# Patient Record
Sex: Male | Born: 1950
Health system: Southern US, Community
[De-identification: ages and names within clinical notes are randomized; demographics above are authoritative.]

## PROBLEM LIST (undated history)

## (undated) DIAGNOSIS — E785 Hyperlipidemia, unspecified: Secondary | ICD-10-CM

## (undated) DIAGNOSIS — I7300 Raynaud's syndrome without gangrene: Secondary | ICD-10-CM

## (undated) DIAGNOSIS — I714 Abdominal aortic aneurysm, without rupture, unspecified: Secondary | ICD-10-CM

## (undated) DIAGNOSIS — E871 Hypo-osmolality and hyponatremia: Secondary | ICD-10-CM

## (undated) DIAGNOSIS — I509 Heart failure, unspecified: Secondary | ICD-10-CM

## (undated) DIAGNOSIS — K219 Gastro-esophageal reflux disease without esophagitis: Secondary | ICD-10-CM

## (undated) DIAGNOSIS — I639 Cerebral infarction, unspecified: Secondary | ICD-10-CM

## (undated) DIAGNOSIS — R112 Nausea with vomiting, unspecified: Secondary | ICD-10-CM

## (undated) DIAGNOSIS — F419 Anxiety disorder, unspecified: Secondary | ICD-10-CM

## (undated) DIAGNOSIS — I739 Peripheral vascular disease, unspecified: Secondary | ICD-10-CM

## (undated) DIAGNOSIS — Z9889 Other specified postprocedural states: Secondary | ICD-10-CM

## (undated) DIAGNOSIS — Z9289 Personal history of other medical treatment: Secondary | ICD-10-CM

## (undated) DIAGNOSIS — I10 Essential (primary) hypertension: Secondary | ICD-10-CM

## (undated) DIAGNOSIS — M51369 Other intervertebral disc degeneration, lumbar region without mention of lumbar back pain or lower extremity pain: Secondary | ICD-10-CM

## (undated) DIAGNOSIS — J449 Chronic obstructive pulmonary disease, unspecified: Secondary | ICD-10-CM

## (undated) DIAGNOSIS — M5136 Other intervertebral disc degeneration, lumbar region: Secondary | ICD-10-CM

## (undated) DIAGNOSIS — I219 Acute myocardial infarction, unspecified: Secondary | ICD-10-CM

## (undated) DIAGNOSIS — I2510 Atherosclerotic heart disease of native coronary artery without angina pectoris: Secondary | ICD-10-CM

## (undated) HISTORY — DX: Chronic obstructive pulmonary disease, unspecified: J44.9

## (undated) HISTORY — DX: Hyperlipidemia, unspecified: E78.5

## (undated) HISTORY — DX: Other intervertebral disc degeneration, lumbar region without mention of lumbar back pain or lower extremity pain: M51.369

## (undated) HISTORY — DX: Other intervertebral disc degeneration, lumbar region: M51.36

## (undated) HISTORY — DX: Abdominal aortic aneurysm, without rupture: I71.4

## (undated) HISTORY — DX: Cerebral infarction, unspecified: I63.9

## (undated) HISTORY — DX: Raynaud's syndrome without gangrene: I73.00

## (undated) HISTORY — PX: HERNIA REPAIR: SHX51

## (undated) HISTORY — DX: Abdominal aortic aneurysm, without rupture, unspecified: I71.40

## (undated) HISTORY — DX: Essential (primary) hypertension: I10

## (undated) HISTORY — PX: CAROTID ENDARTERECTOMY: SUR193

## (undated) HISTORY — DX: Peripheral vascular disease, unspecified: I73.9

## (undated) HISTORY — DX: Atherosclerotic heart disease of native coronary artery without angina pectoris: I25.10

---

## 2003-02-22 ENCOUNTER — Encounter: Payer: Self-pay | Admitting: Emergency Medicine

## 2003-02-23 ENCOUNTER — Inpatient Hospital Stay (HOSPITAL_COMMUNITY): Admission: EM | Admit: 2003-02-23 | Discharge: 2003-02-24 | Payer: Self-pay | Admitting: Emergency Medicine

## 2003-02-24 ENCOUNTER — Encounter (INDEPENDENT_AMBULATORY_CARE_PROVIDER_SITE_OTHER): Payer: Self-pay | Admitting: Specialist

## 2004-05-24 HISTORY — PX: CARDIAC CATHETERIZATION: SHX172

## 2005-03-14 ENCOUNTER — Inpatient Hospital Stay (HOSPITAL_COMMUNITY): Admission: EM | Admit: 2005-03-14 | Discharge: 2005-03-17 | Payer: Self-pay | Admitting: *Deleted

## 2005-03-29 ENCOUNTER — Encounter: Admission: RE | Admit: 2005-03-29 | Discharge: 2005-03-29 | Payer: Self-pay | Admitting: *Deleted

## 2005-04-13 ENCOUNTER — Ambulatory Visit (HOSPITAL_COMMUNITY): Admission: AD | Admit: 2005-04-13 | Discharge: 2005-04-14 | Payer: Self-pay | Admitting: *Deleted

## 2005-04-13 ENCOUNTER — Encounter (INDEPENDENT_AMBULATORY_CARE_PROVIDER_SITE_OTHER): Payer: Self-pay | Admitting: Specialist

## 2005-04-13 HISTORY — PX: CAROTID ENDARTERECTOMY: SUR193

## 2005-06-07 ENCOUNTER — Encounter (INDEPENDENT_AMBULATORY_CARE_PROVIDER_SITE_OTHER): Payer: Self-pay | Admitting: Specialist

## 2005-06-07 ENCOUNTER — Ambulatory Visit (HOSPITAL_COMMUNITY): Admission: AD | Admit: 2005-06-07 | Discharge: 2005-06-08 | Payer: Self-pay | Admitting: *Deleted

## 2005-07-21 ENCOUNTER — Ambulatory Visit (HOSPITAL_COMMUNITY): Admission: RE | Admit: 2005-07-21 | Discharge: 2005-07-21 | Payer: Self-pay | Admitting: *Deleted

## 2005-10-04 ENCOUNTER — Ambulatory Visit (HOSPITAL_COMMUNITY): Admission: RE | Admit: 2005-10-04 | Discharge: 2005-10-04 | Payer: Self-pay | Admitting: *Deleted

## 2005-10-15 ENCOUNTER — Observation Stay (HOSPITAL_COMMUNITY): Admission: RE | Admit: 2005-10-15 | Discharge: 2005-10-17 | Payer: Self-pay | Admitting: *Deleted

## 2005-10-15 DIAGNOSIS — I639 Cerebral infarction, unspecified: Secondary | ICD-10-CM

## 2005-10-15 HISTORY — DX: Cerebral infarction, unspecified: I63.9

## 2005-10-15 HISTORY — PX: ILIAC ARTERY STENT: SHX1786

## 2005-12-02 ENCOUNTER — Encounter: Admission: RE | Admit: 2005-12-02 | Discharge: 2005-12-02 | Payer: Self-pay | Admitting: Family Medicine

## 2005-12-09 ENCOUNTER — Inpatient Hospital Stay (HOSPITAL_COMMUNITY): Admission: EM | Admit: 2005-12-09 | Discharge: 2005-12-13 | Payer: Self-pay | Admitting: Emergency Medicine

## 2005-12-27 ENCOUNTER — Encounter: Admission: RE | Admit: 2005-12-27 | Discharge: 2006-03-27 | Payer: Self-pay | Admitting: Internal Medicine

## 2006-01-16 ENCOUNTER — Observation Stay (HOSPITAL_COMMUNITY): Admission: EM | Admit: 2006-01-16 | Discharge: 2006-01-17 | Payer: Self-pay | Admitting: Emergency Medicine

## 2006-07-29 ENCOUNTER — Encounter: Admission: RE | Admit: 2006-07-29 | Discharge: 2006-07-29 | Payer: Self-pay | Admitting: Family Medicine

## 2006-11-09 ENCOUNTER — Encounter: Admission: RE | Admit: 2006-11-09 | Discharge: 2006-11-09 | Payer: Self-pay | Admitting: Orthopedic Surgery

## 2006-11-28 ENCOUNTER — Encounter: Admission: RE | Admit: 2006-11-28 | Discharge: 2006-11-28 | Payer: Self-pay | Admitting: Orthopedic Surgery

## 2006-12-21 ENCOUNTER — Emergency Department (HOSPITAL_COMMUNITY): Admission: EM | Admit: 2006-12-21 | Discharge: 2006-12-21 | Payer: Self-pay | Admitting: *Deleted

## 2007-02-09 ENCOUNTER — Ambulatory Visit: Payer: Self-pay | Admitting: *Deleted

## 2007-06-22 ENCOUNTER — Ambulatory Visit: Payer: Self-pay | Admitting: *Deleted

## 2007-07-26 ENCOUNTER — Ambulatory Visit: Payer: Self-pay | Admitting: *Deleted

## 2007-07-27 ENCOUNTER — Ambulatory Visit: Payer: Self-pay | Admitting: *Deleted

## 2008-02-08 ENCOUNTER — Ambulatory Visit: Payer: Self-pay | Admitting: *Deleted

## 2008-08-01 ENCOUNTER — Ambulatory Visit: Payer: Self-pay | Admitting: *Deleted

## 2009-02-05 ENCOUNTER — Ambulatory Visit: Payer: Self-pay | Admitting: Vascular Surgery

## 2009-08-11 ENCOUNTER — Ambulatory Visit: Payer: Self-pay | Admitting: Surgery

## 2010-04-21 ENCOUNTER — Ambulatory Visit: Payer: Self-pay | Admitting: Cardiology

## 2010-05-01 ENCOUNTER — Ambulatory Visit: Payer: Self-pay | Admitting: Vascular Surgery

## 2010-05-19 ENCOUNTER — Ambulatory Visit: Payer: Self-pay | Admitting: Cardiology

## 2010-06-14 ENCOUNTER — Encounter: Payer: Self-pay | Admitting: Family Medicine

## 2010-06-16 ENCOUNTER — Ambulatory Visit: Payer: Self-pay | Admitting: Cardiology

## 2010-07-20 ENCOUNTER — Encounter (INDEPENDENT_AMBULATORY_CARE_PROVIDER_SITE_OTHER): Payer: Private Health Insurance - Indemnity

## 2010-07-20 DIAGNOSIS — R609 Edema, unspecified: Secondary | ICD-10-CM

## 2010-07-23 NOTE — Procedures (Unsigned)
DUPLEX DEEP VENOUS EXAM - LOWER EXTREMITY  INDICATION:  Swollen left ankle.  HISTORY:  Edema:  Yes. Trauma/Surgery:  No. Pain:  Yes. PE:  No. Previous DVT:  No. Anticoagulants:  No. Other:  DUPLEX EXAM:               CFV   SFV   PopV  PTV    GSV               R  L  R  L  R  L  R   L  R  L Thrombosis    o  o     o     o      o     o Spontaneous   +  +     +     +      +     + Phasic        +  +     +     +      +     + Augmentation  +  +     +     +      +     + Compressible  +  +     +     +      +     + Competent     +  +     +     o      +     +  Legend:  + - yes  o - no  p - partial  D - decreased  IMPRESSION: 1. No evidence of left lower extremity deep venous thrombosis. 2. Preliminary findings were called to Dr. Susa Simmonds office.   _____________________________ Judeth Cornfield. Scot Dock, M.D.  EM/MEDQ  D:  07/20/2010  T:  07/20/2010  Job:  [id number]

## 2010-08-03 ENCOUNTER — Other Ambulatory Visit: Payer: Self-pay

## 2010-08-05 ENCOUNTER — Other Ambulatory Visit: Payer: Self-pay

## 2010-08-14 ENCOUNTER — Other Ambulatory Visit (INDEPENDENT_AMBULATORY_CARE_PROVIDER_SITE_OTHER): Payer: Private Health Insurance - Indemnity

## 2010-08-14 DIAGNOSIS — Z48812 Encounter for surgical aftercare following surgery on the circulatory system: Secondary | ICD-10-CM

## 2010-08-14 DIAGNOSIS — I6529 Occlusion and stenosis of unspecified carotid artery: Secondary | ICD-10-CM

## 2010-08-22 NOTE — Procedures (Unsigned)
CAROTID DUPLEX EXAM  INDICATION:  Bilateral carotid endarterectomies.  HISTORY: Diabetes:  No. Cardiac:  Coronary artery disease. Hypertension:  Yes. Smoking:  Previous. Previous Surgery:  Bilateral carotid endarterectomies. CV History:  Currently asymptomatic. Amaurosis Fugax No, Paresthesias No, Hemiparesis No Other:  Hyperlipidemia.                                      RIGHT             LEFT Brachial systolic pressure:         162               154 Brachial Doppler waveforms:         Normal            Normal Vertebral direction of flow:        Antegrade         Antegrade DUPLEX VELOCITIES (cm/sec) CCA peak systolic                   104               95 ECA peak systolic                   118               [id number] ICA peak systolic                   106               89 ICA end diastolic                   17                22 PLAQUE MORPHOLOGY: PLAQUE AMOUNT:                      None              None PLAQUE LOCATION:  IMPRESSION: 1. Patent bilateral carotid endarterectomy sites with no evidence of     restenosis of the internal carotid arteries. 2. Antegrade vertebral arteries bilaterally. 3. Stable from previous study.  ___________________________________________ Conrad [place], MD  EM/MEDQ  D:  08/14/2010  T:  08/14/2010  Job:  [id number]

## 2010-08-31 ENCOUNTER — Telehealth: Payer: Self-pay | Admitting: *Deleted

## 2010-08-31 NOTE — Telephone Encounter (Signed)
Left message for pt to f/u with Dr. Burt Knack in July since Dr. Doreatha Lew is retiring.  Pt told to call with any concerns.

## 2010-10-05 ENCOUNTER — Other Ambulatory Visit: Payer: Self-pay | Admitting: Cardiology

## 2010-10-05 MED ORDER — RANITIDINE HCL 150 MG PO TABS: 300.0000 mg | ORAL_TABLET | ORAL | Status: DC

## 2010-10-06 NOTE — Procedures (Signed)
CAROTID DUPLEX EXAM   INDICATION:  Follow up bilateral carotid endarterectomies.   HISTORY:  Diabetes:  No.  Cardiac:  CAD.  Hypertension:  Yes.  Smoking:  Previous.  Previous Surgery:  Bilateral carotid endarterectomy.  CV History:  Asymptomatic.  Amaurosis Fugax No, Paresthesias No, Hemiparesis No.                                       RIGHT             LEFT  Brachial systolic pressure:         211               212  Brachial Doppler waveforms:         Normal            Normal  Vertebral direction of flow:        Not visualized    Antegrade  DUPLEX VELOCITIES (cm/sec)  CCA peak systolic                   59                71  ECA peak systolic                   80                0000000  ICA peak systolic                   82                68  ICA end diastolic                   24                25  PLAQUE MORPHOLOGY:  PLAQUE AMOUNT:                      None              None  PLAQUE LOCATION:   IMPRESSION:  1. Patent bilateral carotid endarterectomy sites with no bilateral      internal carotid artery stenosis noted.  2. Unable to adequately visualize the right vertebral artery due to      vessel depth.  3. No significant change noted when compared to the previous      examination on 02/08/2008.   ___________________________________________  V. Leia Alf, MD   CH/MEDQ  D:  08/11/2009  T:  08/12/2009  Job:  [id number]

## 2010-10-06 NOTE — Procedures (Signed)
VASCULAR LAB EXAM   INDICATION:  Follow up left common iliac and external iliac stent.   HISTORY:  Diabetes:  No.  Cardiac:  Coronary artery disease.  Hypertension:  Yes.   EXAM:  Duplex of left common iliac stent and left external iliac artery  through superficial femoral artery.   IMPRESSION:  Patent left common iliac and external iliac stent with no  evidence of focal stenosis.   ___________________________________________  P. Drucie Opitz, M.D.   MG/MEDQ  D:  02/08/2008  T:  02/08/2008  Job:  [id number]

## 2010-10-06 NOTE — Procedures (Signed)
CAROTID DUPLEX EXAM   INDICATION:  Follow up carotid artery disease.   HISTORY:  Diabetes:  No.  Cardiac:  Coronary artery disease.  Hypertension:  Yes.  Smoking:  No, quit three years ago.  Previous Surgery:  Right carotid endarterectomy with DPA on 04/13/2005,  left carotid endarterectomy with DPA on 06/08/2005, both by Dr. Amedeo Plenty.  CV History:  Amaurosis Fugax No, Paresthesias No, Hemiparesis No                                       RIGHT             LEFT  Brachial systolic pressure:         200               200  Brachial Doppler waveforms:         Triphasic         Triphasic  Vertebral direction of flow:        Unable to detect  Unable to detect  DUPLEX VELOCITIES (cm/sec)  CCA peak systolic                   65                64  ECA peak systolic                   81                [id number]  ICA peak systolic                   83                42  ICA end diastolic                   34                17  PLAQUE MORPHOLOGY:                  Mixed             None  PLAQUE AMOUNT:                      Mild              None                        PLAQUE LOCATION:  ICA   None   Mildly elevated velocities were noted in both distal ICA's with no  plaque noted.   IMPRESSION:  1. A 1-39% right internal carotid artery stenosis, status post      endarterectomy.  2. No left internal carotid artery stenosis, status post      endarterectomy.  3. Vertebral arteries not detected, possible occlusion versus      technical difficulty.  4. Study essentially unchanged from February 03, 2006.   ___________________________________________  P. Drucie Opitz, M.D.   DP/MEDQ  D:  02/09/2007  T:  02/09/2007  Job:  [id number]

## 2010-10-06 NOTE — Procedures (Signed)
DUPLEX ULTRASOUND OF ABDOMINAL AORTA   INDICATION:  Follow up abdominal aortic aneurysm.   HISTORY:  Diabetes:  No.  Cardiac:  Coronary artery disease.  Hypertension:  Yes.  Smoking:  Quit about four years ago.  Connective Tissue Disorder:  Family History:  Previous Surgery:   DUPLEX EXAM:         AP (cm)                   TRANSVERSE (cm)  Proximal             2.70 cm                   3.04 cm  Mid                  3.70 cm                   3.83 cm  Distal               2.51 cm                   2.59 cm  Right Iliac          Not well visualized       Not well visualized  Left Iliac           Not well visualized       Not well visualized   PREVIOUS:  Date:  AP:  3.98  TRANSVERSE:  3.84   IMPRESSION:  Abdominal aortic aneurysm noted with the largest  measurement of 3.70 X 3.83 cm.   ___________________________________________  P. Drucie Opitz, M.D.   MG/MEDQ  D:  02/08/2008  T:  02/08/2008  Job:  [id number]

## 2010-10-06 NOTE — Procedures (Signed)
DUPLEX ULTRASOUND OF ABDOMINAL AORTA   INDICATION:  Follow up abdominal aortic aneurysm.   HISTORY:  Diabetes:  No.  Cardiac:  CAD.  Hypertension:  Yes.  Smoking:  Quit.  Connective Tissue Disorder:  Family History:  Father.  Previous Surgery:  Left CIA stenting/PTA and left EIA PTA on 10/15/05 by  Dr. Amedeo Plenty.   DUPLEX EXAM:         AP (cm)                   TRANSVERSE (cm)  Proximal             2.69 cm                   2.74 cm  Mid                  3.78 cm                   3.77 cm  Distal               2.28 cm                   2.43 cm  Right Iliac          1.07 cm  Left Iliac           0.92 cm   PREVIOUS:  Date: 02/08/08  AP:  3.7  TRANSVERSE:  3.83   IMPRESSION:  1. Stable abdominal aortic aneurysm with largest measurement of 3.78      cm X 3.77 cm.  2. Note:  Some areas of limited visualization due to body habitus,      bowel gas, and technical limitations.   ___________________________________________  P. Drucie Opitz, M.D.   AS/MEDQ  D:  08/01/2008  T:  08/01/2008  Job:  [id number]

## 2010-10-06 NOTE — Procedures (Signed)
RENAL ARTERY DUPLEX EVALUATION   INDICATION:  Followup evaluation of bilateral renal artery stenosis by  angiogram on Oct 15, 2005.   HISTORY:  Diabetes:  No.  Cardiac:  Coronary artery disease.  Hypertension:  Yes.  Smoking:  Quit 4 years ago.   RENAL ARTERY DUPLEX FINDINGS:  Aorta-Proximal:  37 cm/s (known small AAA)  Aorta-Mid:  25 cm/s (known small AAA)  Aorta-Distal:  74 cm/s  Celiac Artery Origin:  145 cm/s  SMA Origin:  201 cm/s                                    RIGHT               LEFT  Renal Artery Origin:             159/50 cm/s         181/66 cm/s  Renal Artery Proximal:           76/26 cm/s          55/21 cm/s  Renal Artery Mid:                78/25 cm/s          55/24 cm/s  Renal Artery Distal:             76/32 cm/s          52/20 cm/s  Hilar Acceleration Time (AT):    0.07 m/s2           0.06 m/s2  Renal-Aortic Ratio (RAR):        N/A                 N/A  Kidney Size:                     11.8 cm             0000000 cm  End Diastolic Ratio (EDR):       0.39 to 0.43        0.40 to 0.42  Resistive Index (RI):            0.65                0.61   IMPRESSION:  1. Renal-to-aortic ratio is not used due to patient's known small AAA.  2. Velocity criteria suggests a 40-60% left renal artery stenosis,      which would be stable as compared to 5/07 arteriogram.  3. Right renal artery appears to have perphaps a mild, <60% stenosis.  4. Kidneys are normal with respect to size and shape bilaterally.  5. No evidence of parenchymal disease bilaterally.   ___________________________________________  P. Drucie Opitz, M.D.   MC/MEDQ  D:  07/27/2007  T:  07/27/2007  Job:  [id number]

## 2010-10-06 NOTE — Procedures (Signed)
VASCULAR LAB EXAM   INDICATION:  Follow up left common iliac artery and external iliac  artery, PTA and stenting.   HISTORY:  Diabetes:  No.  Cardiac:  Coronary artery disease.  Hypertension:  Yes.   EXAM:  Duplex of the left common iliac and external iliac artery.   IMPRESSION:  Patent left common iliac artery and left external iliac  artery with no evidence of focal stenosis.   ___________________________________________  P. Drucie Opitz, M.D.   MG/MEDQ  D:  07/26/2007  T:  07/26/2007  Job:  [id number]

## 2010-10-06 NOTE — Procedures (Signed)
CAROTID DUPLEX EXAM   INDICATION:  Follow up bilateral carotid endarterectomy.   HISTORY:  Diabetes:  No.  Cardiac:  Coronary artery disease.  Hypertension:  Yes.  Smoking:  Quit four years ago.  Previous Surgery:  Please see above.  CV History:  Amaurosis Fugax No, Paresthesias No, Hemiparesis No.                                       RIGHT             LEFT  Brachial systolic pressure:         129               124  Brachial Doppler waveforms:         Biphasic          Biphasic  Vertebral direction of flow:        Not well visualized                 Antegrade  DUPLEX VELOCITIES (cm/sec)  CCA peak systolic                   76                84  ECA peak systolic                   98                [id number]  ICA peak systolic                   77                83  ICA end diastolic                   26                22  PLAQUE MORPHOLOGY:                  None              None  PLAQUE AMOUNT:                      None              None  PLAQUE LOCATION:                    None              None   IMPRESSION:  1. Normal carotid duplex noted bilaterally.  2. Status post bilateral carotid endarterectomies.  3. Antegrade left vertebral artery.   ___________________________________________  P. Drucie Opitz, M.D.   MG/MEDQ  D:  02/08/2008  T:  02/08/2008  Job:  [id number]

## 2010-10-06 NOTE — Procedures (Signed)
DUPLEX ULTRASOUND OF ABDOMINAL AORTA   INDICATION:  Follow up abdominal aortic aneurysm.   HISTORY:  Diabetes:  No.  Cardiac:  Coronary artery disease.  Hypertension:  Yes.  Smoking:  Quit four years ago.  Connective Tissue Disorder:  Family History:  Previous Surgery:  Bilateral carotid endarterectomy.   DUPLEX EXAM:         AP (cm)                   TRANSVERSE (cm)  Proximal             2.67 Cm                   2.36 cm  Mid                  3.98 cm                   3.84 cm  Distal               2.33 cm                   2.29 cm  Right Iliac          Not well visualized       Not well visualized  Left Iliac           Not well visualized       Not well visualized   PREVIOUS:  Date:  3.8 cm   IMPRESSION:  Abdominal aortic aneurysm noted with the largest  measurement of 3.98 cm X 3.84 cm.   ___________________________________________  P. Drucie Opitz, M.D.   MG/MEDQ  D:  07/26/2007  T:  07/26/2007  Job:  [id number]

## 2010-10-06 NOTE — Procedures (Signed)
CAROTID DUPLEX EXAM   INDICATION:  Followup, bilateral carotid endarterectomy.   HISTORY:  Diabetes:  No.  Cardiac:  Coronary artery disease.  Hypertension:  Yes.  Smoking:  Quit four years ago.  Previous Surgery:  Bilateral carotid endarterectomies.  CV History:  Amaurosis Fugax No, Paresthesias No, Hemiparesis No                                       RIGHT             LEFT  Brachial systolic pressure:         134               142  Brachial Doppler waveforms:         Biphasic          Biphasic  Vertebral direction of flow:        Antegrade         Antegrade  DUPLEX VELOCITIES (cm/sec)  CCA peak systolic                   66                85  ECA peak systolic                   112               [id number]  ICA peak systolic                   86                70  ICA end diastolic                   25                24  PLAQUE MORPHOLOGY:                  None              None  PLAQUE AMOUNT:                      None              None  PLAQUE LOCATION:                    None              None   IMPRESSION:  1. Normal carotid duplex noted bilaterally.  2. Status post bilateral carotid endarterectomy.  3. Antegrade bilateral vertebral arteries.   ___________________________________________  P. Drucie Opitz, M.D.   MG/MEDQ  D:  07/26/2007  T:  07/26/2007  Job:  [id number]

## 2010-10-06 NOTE — Procedures (Signed)
DUPLEX ULTRASOUND OF ABDOMINAL AORTA   INDICATION:  Followup abdominal aortic aneurysm.   HISTORY:  Diabetes:  No.  Cardiac:  CAD.  Hypertension:  Yes.  Smoking:  Previous.  Connective Tissue Disorder:  Family History:  Father.  Previous Surgery:  Left CIA PTA / stent and left EIA PTA 10/15/2005 by  Dr. Amedeo Plenty.   DUPLEX EXAM:         AP (cm)                   TRANSVERSE (cm)  Proximal             2.55 cm                   2.72 cm  Mid                  3.01 cm                   3.21 cm  Distal               4.16 cm                   3.95 cm  Right Iliac          1.10 cm                   1.05 cm  Left Iliac           0.86 cm                   1.01 cm   PREVIOUS:  Date:  08/01/2008  AP:  3.78  TRANSVERSE:  3.77   IMPRESSION:  1. Abdominal aortic aneurysm shows slight increase from previous      studies with largest measurement of 4.16 cm x 3.95 cm.  2. Patent left common iliac artery stent.   ___________________________________________  Jessy Oto. Fields, MD   AS/MEDQ  D:  02/05/2009  T:  02/05/2009  Job:  [id number]

## 2010-10-06 NOTE — Assessment & Plan Note (Signed)
OFFICE VISIT   [name], [name]  DOB:  [birth year]                                       07/27/2007  [id number]   [name] returned to the office today after undergoing an  abdominal Duplex scan.  This reveals his left common iliac and external  iliac artery stents to be widely patent without evidence of restenosis.  His ankle brachial indices are fairly well maintained at 0.96 in the  right leg and 0.86 in the left.   He has a small abdominal aortic aneurysm which measures 4 cm in maximal  diameter.   He also has a history of bilateral carotid endarterectomies and his  followup carotid Duplex reveals normal velocities without evidence of  restenosis and antegrade bilateral vertebral flow.   His main complaint at this time is that of buttock and hip discomfort  with ambulation.  He stops, sits down and rests and he can then walk  once again with recurrence of the process.   These findings seem most consistent with his lumbar spine degenerative  disease.  It is my understanding that there are plans by Dr. French Ana to  go ahead and try some epidural injections, I am in agreement with this.   Will plan followup otherwise in 6 months with a carotid Doppler and  ultrasound of the abdomen.   Dorothea Glassman, M.D.  Electronically Signed   PGH/MEDQ  D:  07/27/2007  T:  07/28/2007  Job:  [id number]   cc:   Lockie Pares, M.D.  Janifer Adie, M.D.  Kaylyn Lim., M.D.

## 2010-10-06 NOTE — Assessment & Plan Note (Signed)
OFFICE VISIT   [name], [name]  DOB:  01-Feb-1951                                       05/01/2010  [id number]   This is an established patient.   HISTORY OF PRESENT ILLNESS:  This is a 60 year old gentleman being  followed in this practice for three separate issues.  He has previously  undergone bilateral carotid endarterectomies and most recently had his  repeat duplexes completed on 08/13/2009, at that point he had no  recurrence of disease on either side.  In regards to his carotid disease  he has no TIA or stroke symptoms which include no episodes of amaurosis  fugax, no episodes of blindness, no facial drooping or hemiplegia and no  episodes of expressive or receptive aphasia.  His risk factors for  carotid and peripheral disease include hyperlipidemia, hypertension and  previous history of significant smoking.  His risk factor management  includes use of Vytorin, Crestor.  He is on aspirin and Plavix for  antiplatelets.  He is not a diabetic.  In terms of his hypertensive  control he is on lisinopril and carvedilol and labetalol.  His second  issue is peripheral arterial disease.  He has previously undergone left  common iliac and external iliac stents.  At this point he does not have  symptoms consistent with vascular claudication.  He notes pain in his  hips with walking.  Apparently his ambulation is severely limited by his  coronary artery disease.  He apparently has nonintervenable coronary  artery disease.  He has very limited activity due to his heart.  He  notes that he has to walk he develops a burning sensation in his hip and  pain there which then he resolves with rest and he continues walking and  he is able to ambulate to the degree he needs to, to complete his  activities of daily living.  He is also able to ambulate and walk on  vacations.  He does not note any pain with ambulation in the foot or the  calf.  His third problem that  we are managing him for is his abdominal  aortic aneurysm of which he notes no abdominal pain.  He has no family  history of aneurysmal disease and he has no history of connective tissue  disorder.   PAST MEDICAL HISTORY:  1. Peripheral arterial disease.  2. Abdominal aortic aneurysm.  3. Lumbar degenerative disk disease.  4. Hypertension.  5. Hyperlipidemia.  6. Coronary artery disease.  7. COPD.  8. Raynaud's phenomenon.   PAST SURGICAL HISTORY:  1. Left common and external iliac artery stent placement.  2. Left carotid endarterectomy.  3. Right carotid endarterectomy.   SOCIAL HISTORY:  He quit smoking in 2007, prior to that he had smoked  for nearly 75 pack year history.  He drinks about two beers a day of  alcohol.  Otherwise no illicit drugs.   FAMILY HISTORY:  Father had coronary artery disease, hypertension,  hyperlipidemia.  The mother had carotid disease, osteoarthritis and  Alzheimer's disease.   MEDICATIONS:  Included Vytorin, labetalol, Plavix, aspirin, folic acid,  Crestor, carvedilol, lisinopril, Xanax, vitamin B1.   ALLERGIES:  He has no known drug allergies.   PHYSICAL EXAMINATION:  Vital signs:  Today he had a blood pressure  147/82, heart rate of 71, respirations were 12, satting 98%.  General:  He is alert and oriented x3.  He is well-developed, well-nourished,  slightly obese.  Head:  Normocephalic, atraumatic.  ENT:  Hearing is  slightly decreased in the right ear but otherwise grossly intact.  Nares  without any erythema or drainage.  Oropharynx without any erythema or  exudate.  He does not have any teeth and he has upper and lower dentures  placed.  Eyes:  Pupils were equal, round, reactive to light.  Extraocular movements were intact.  Neck:  Supple neck.  No nuchal  rigidity.  No palpable lymphadenopathy.  Pulmonary:  Symmetric  expansion.  Good air movement.  Clear to auscultation bilaterally with  no rales, rhonchi or wheezing.  Cardiac:   Regular rate and rhythm.  Normal S1-S2.  No murmurs, rubs, thrills or gallops were auscultated.  Vascular:  I was able to palpate radial, brachial pulses bilaterally  without any problems.  He had palpable carotids.  The incisions in the  neck are well-healed.  There is no bruit in either carotid.  His aorta  cannot be palpated due to his pannus.  Femorals were palpable.  Popliteal on the left side were palpable, I could not feel the popliteal  on the right side.  DPs bilaterally were strong and there were palpable  PTs bilaterally.  GI:  He is obese, soft abdomen, nontender,  nondistended.  No guarding or rebound.  No hepatosplenomegaly.  No  masses.  Motor strength:  He had 5/5 strength in all extremities.  There  is no evidence of any gangrene or ulcerations.  Both hands had purplish  fingers and his feet were also purplish.  Neurological:  Cranial nerves  II-XII were intact.  He had some decreased sensation in his feet up to  the level of the ankle.  His upper extremities had intact sensation.  Motor exam was as listed above.  Psychiatric:  Judgment was intact.  His mood and affect were appropriate  for his clinical situation.  Skin:  Extremities were as listed above.  There were no other signs of any cyanosis elsewhere other than the hands  and the feet.  There were otherwise no rashes noted.  Lymphatic:  There  was no cervical, axillary or inguinal lymphadenopathy.   NONINVASIVE STUDIES:  First study was bilateral lower extremity ABIs.  Right side was 0.99 down from 1.1 in September of 2009.  The right side  is 0.78 down from 1.0 in the same time span.  The waveforms on the left  side are triphasic, on the right side they were biphasic with some  hyperemia.  The second study was a duplex ultrasound of the aorta.  It  demonstrates a 4.05 cm transverse measurement and previously this was  4.0 so this is stable in terms of size.  A technical note, the proximal  abdominal aorta could  not be visualized due to bowel gas.  The final  study was a left iliac duplex.  The maximal velocities are up to 210  cm/sec in the iliac stent.   MEDICAL DECISION MAKING:  This is a 60 year old gentleman with multiple  vascular issues that we are managing.  In terms of his carotid disease  he will continue actually annual duplexes, given that his last two  studies have demonstrated no significant restenosis it is unlikely he is  going to develop any additional post carotid endarterectomy stenosis as  most of the cases of restenosis occur within the 2 years immediately  after the carotid  endarterectomy.  In terms of his peripheral arterial  disease I would continue q.6 month surveillance on his stents.  His  right leg is asymptomatic currently despite decrease in ABI as I suspect  his cardiac disease may be limiting him manifesting any vasogenic  claudication.  At this point he has palpable pulses bilaterally, so  there is no intervention that would improve it beyond being asymptomatic  so I do not intend to intervene on him at this point.  In terms of his  aorta he has only a 4 cm AAA currently and all the recent studies in  regards to small interventions on smaller aneurysms have demonstrated no  advantage so the current standard of care still is to intervene at the 5  cm to 5.5 cm.  At this point we discussed he is at risk for possible  rupture but his rupture rate is probably on the order of 2%-5% per year  substantially lower than his rate of mortality with operative  intervention so at this point I would continue also q.6 month  surveillance on his aorta.     Adele Barthel, MD  Electronically Signed   BC/MEDQ  D:  05/01/2010  T:  05/01/2010  Job:  [id number]   cc:   Dr Doreatha Lew

## 2010-10-06 NOTE — Procedures (Signed)
DUPLEX ULTRASOUND OF ABDOMINAL AORTA   INDICATION:  Abdominal aortic aneurysm.   HISTORY:  Diabetes:  No.  Cardiac:  CAD.  Hypertension:  Yes.  Smoking:  Previous.  Connective Tissue Disorder:  Family History:  Father.  Previous Surgery:  Left common iliac artery stent and left external  iliac PTA on 10/15/2005 by Dr. Amedeo Plenty.   DUPLEX EXAM:         AP (cm)                   TRANSVERSE (cm)  Proximal             Not visualized            Not visualized  Mid                  2.1 cm                    2.2 cm  Distal               4.2 cm                    4.0 cm  Right Iliac          1.1 cm                    1.2 cm  Left Iliac           0.7 cm                    1.1 cm   PREVIOUS:  Date: 02/05/2009  AP:  4.16  TRANSVERSE:  3.95   IMPRESSION:  1. Stable aneurysmal measurements of the distal abdominal aorta noted.  2. Patent left common iliac and external iliac arteries with increased      velocities of >200 cm/s noted at the distal common iliac and      proximal external iliac artery levels, as described on the attached      work sheet.  3. The right ankle brachial index is 1.0, and the left ankle brachial      index is 1.1.  4. Unable to adequately visualize the proximal aorta due to overlying      bowel gas patterns.         ___________________________________________  V. Leia Alf, MD   CH/MEDQ  D:  08/11/2009  T:  08/12/2009  Job:  [id number]

## 2010-10-06 NOTE — Procedures (Signed)
DUPLEX ULTRASOUND OF ABDOMINAL AORTA   INDICATION:  Abdominal aortic aneurysm followup.   HISTORY:  Diabetes:  No.  Cardiac:  Coronary artery disease.  Hypertension:  Yes.  Smoking:  Previous.  Connective Tissue Disorder:  Family History:  Father.  Previous Surgery:  Left common iliac artery stent and left external  iliac artery PTA on 10/15/2005.   DUPLEX EXAM:         AP (cm)                   TRANSVERSE (cm)  Proximal             Not visualized            Not visualized  Mid                  3.60 cm                   3.67 cm  Distal               3.84 cm                   4.05 cm  Right Iliac          cm                        cm  Left Iliac           cm                        cm   PREVIOUS:  Date:  08/11/2009  AP:  4.2  TRANSVERSE:  4.0   IMPRESSION:  1. Stable aneurysmal measurements of the distal abdominal aorta noted.  2. Unable to visualize proximal abdominal aorta due to bowel gas.  3. Patent left common iliac artery and external iliac artery with      increased velocities noted at the distal common iliac and proximal      external iliac artery levels, described on the following worksheet.  4. Right ankle brachial indices have decreased in comparison from the      previous study.  5. Left ankle brachial indices are within normal limits.   ___________________________________________  Adele Barthel, MD   EM/MEDQ  D:  05/01/2010  T:  05/01/2010  Job:  [id number]

## 2010-10-06 NOTE — Assessment & Plan Note (Signed)
OFFICE VISIT   LAWREN, FLORER  DOB:  1950-12-16                                       06/22/2007  [id number]   The patient is a 60 year old gentleman with known vascular disease.  History of bilateral carotid endarterectomies, and left iliac PTA and  stenting.   He has had no lower extremity arterial Doppler studies carried out since  his iliac stent procedures in 2007.  He presents at this time with lower  extremity fatigue.  He can walk for a short distance and then develops  discomfort in his hips and thighs.  He has to stop and sit down, rest,  and can then walk once again.  This is a recurrent process.   He has had an MRI that does reveal a 3.8 cm abdominal aortic aneurysm.  His spine MRI shows disk disease at 2-3, 3-4, and 4-5.  Facet disease is  also noted at 3-4 and 4-5.   Since last seen he has put on some weight.  His BP is 196/110 and pulse  is 88 per minute.  He is alert and oriented.  In no acute distress.  No  carotid bruits.  His abdomen is soft and nontender.  A midline abdominal  bruit is present and radiation to the femoral arteries with bruits also  present.  His femoral pulses are 2+ bilaterally.  Intact posterior  tibial and dorsalis pedis pulses.  No peripheral edema.   The patient has potentially multifactorial causes for his lower  extremity symptoms.  I have scheduled him to undergo an abdominal and  pelvic duplex scan and lower extremity arterial Dopplers.  He will  return to see me with the results of these and further recommendations  will be made at that time.   Dorothea Glassman, M.D.  Electronically Signed   PGH/MEDQ  D:  06/22/2007  T:  06/23/2007  Job:  [id number]   cc:   Lockie Pares, M.D.  Janifer Adie, M.D.  Kaylyn Lim., M.D.

## 2010-10-09 NOTE — H&P (Signed)
NAMEMERCURY, GATCHELL                ACCOUNT NO.:  [id number]   MEDICAL RECORD NO.:  [id number]          PATIENT TYPE:  AMB   LOCATION:  SDS                          FACILITY:  [facility]   PHYSICIAN:  Dorothea Glassman, M.D.    DATE OF BIRTH:  [birth year]   DATE OF ADMISSION:  06/07/2005  DATE OF DISCHARGE:                                HISTORY & PHYSICAL   CHIEF COMPLAINT:  Carotid artery stenosis on the left.   HISTORY OF PRESENT ILLNESS:  This is a 60 year old Caucasian male with a  past medical history of coronary artery disease and heavy tobacco abuse.  The patient was referred to Dr. Dorothea Glassman' office for evaluation of  abnormal carotid Dopplers on March 29, 2005.  The Doppler evaluation and  CT angiography verified severe bilateral internal carotid artery stenosis.  The patient also underwent a cardiac catheterization by Dr. Carlean Jews  of [facility].  The reported findings showed diffusely diseased  right coronary system with no major disease in the left system.  The patient  has been treated medically.  When Dr. Amedeo Plenty first evaluated him in  consultation, he felt that the patient would benefit from staged bilateral  carotid endarterectomies.  The patient was left-handed, so Dr. Amedeo Plenty chose  to pursue a right carotid endarterectomy as the initial procedure on  April 13, 2005.  The patient tolerated the procedure well without any  complications.  The patient was evaluated in the office on June 03, 2005,  and wanted to proceed with his left carotid endarterectomy as soon as  possible, and he was scheduled for June 07, 2005.  The patient denied any  sensory, motor or visual deficits prior to his right carotid endarterectomy  and presently.  The patient denies any new onset of headaches, nausea,  vomiting, vertigo, dizziness or seizures.  The patient also denies numbness,  tingling, weakness, dysphagia, visual changes, syncope, memory loss or  TIA/CVA  symptoms.   PAST MEDICAL HISTORY:  1.  Extracranial cerebrovascular occlusive disease with severe bilateral      internal carotid artery stenosis, status post right CEA.  2.  History of heavy tobacco abuse.  3.  Coronary artery disease.  4.  Hypertension.  5.  Hyperlipidemia.  6.  Chronic obstructive pulmonary disease.  7.  History of cluster headaches.   PAST SURGICAL HISTORY:  1.  Right carotid endarterectomy with Dacron patch angioplasty on April 13, 2005.  2.  Right knee surgery in the 1980's.  3.  Right inguinal hernia repair as a child.  4.  Tonsillectomy as a child.   ALLERGIES:  No known drug allergies.   MEDICATIONS:  1.  Verapamil 120 mg  p.o. b.i.d.  2.  Labetalol 200 mg p.o. daily.  3.  Lisinopril/hydrochlorothiazide 20/25 mg p.o. daily.  4.  Vytorin 10/20 mg p.o. daily.  5.  Aspirin 325 mg p.o. daily.  6.  Imdur 30 mg p.o. daily.  7.  Topamax  25 mg, two tab p.o. q.h.s.  8.  Amitriptyline 25 mg p.o. p.r.n.  9.  Plavix 75 mg p.o. daily.   REVIEW OF SYSTEMS:  See the HPI for pertinent positives and negatives.  Otherwise negative for diabetes mellitus, CVA, syncope and kidney disease.   SOCIAL HISTORY:  The patient is married and lives with his wife.  The  patient is a former tobacco user.  He quit on March 14, 2005.  The patient  occasionally drinks alcohol.  The patient is still employed and works in  Engineer, drilling.  The patient does drive.   FAMILY HISTORY:  Mother has a family history of degenerative joint disease  and coronary artery disease.  The patient's father has a history of  hypertension, hyperlipidemia, coronary artery disease and peripheral  vascular disease.   PHYSICAL EXAMINATION:  VITAL SIGNS:  Blood pressure 119/70, heart rate 68,  respirations 16.  GENERAL:  A 60 year old Caucasian male, in no acute distress and afebrile.  HEENT:  Normocephalic and atraumatic.  Pupils equal, round, reactive to  light and accommodation.   Extraocular movements intact.  Oral mucosa pink  and moist.  Sclerae anicteric.  NECK:  Supple, a left carotid bruit is noted on exam.  No right carotid  bruit is heard.  LUNGS:  Respirations symmetrical on inspiration and clear to auscultation  bilaterally.  HEART:  A regular rate and rhythm.  No murmurs, gallops or rubs.  ABDOMEN:  Soft, nontender, non-distended.  Normoactive bowel sounds x4.  GENITOURINARY:  Deferred.  RECTAL:  Deferred.  EXTREMITIES:  No edema.  Temperature warm.  Pulses 2+ throughout  bilaterally.  NEUROLOGIC:  Cranial nerves I-XII  are grossly intact.  Alert and oriented  x3.  Gait is steady.  Muscle strength is 5+ throughout bilaterally.  Deep  tendon reflexes 2+ and symmetrical.   ASSESSMENT:  1.  Extracranial cerebrovascular occlusive disease with bilateral internal      carotid stenosis, status post right carotid endarterectomy.  2.  Hypertension.  3.  Hyperlipidemia.  4.  History of heavy tobacco abuse.  5.  Coronary artery disease.  6.  Chronic obstructive pulmonary disease.   PLAN:  1.  Admit the patient to [facility] on June 07, 2005,      under Dr. Drucie Opitz' service.  2.  The patient will undergo a left carotid endarterectomy.  3.  Dr. Amedeo Plenty has seen and evaluated the patient prior to admission and      explained the risks and benefits of the procedure in great detail.  The      patient has agreed to continue.      Richardson Dopp, PA      P. Drucie Opitz, M.D.  Electronically Signed    JMW/MEDQ  D:  06/04/2005  T:  06/05/2005  Job:  [id number]   cc:   Janifer Adie, M.D.  Fax: [phone]   Kaylyn Lim., M.D.  Fax: [phone]

## 2010-10-09 NOTE — Op Note (Signed)
NAMESLYVESTER, GAJ                ACCOUNT NO.:  [id number]   MEDICAL RECORD NO.:  [id number]          PATIENT TYPE:  OIB   LOCATION:  [id number]                         FACILITY:  [facility]   PHYSICIAN:  Dorothea Glassman, M.D.    DATE OF BIRTH:  04-17-1951   DATE OF PROCEDURE:  06/08/2005  DATE OF DISCHARGE:                                 OPERATIVE REPORT   SURGEON:  P Cameron Sprang, MD   ASSISTANT:  Judeth Cornfield. Scot Dock, MD and [place], [place].   ANESTHETIC:  General endotracheal.   ANESTHESIOLOGIST:  Dr. Ola Spurr.   PREOPERATIVE DIAGNOSIS:  Severe left internal carotid artery stenosis.   POSTOPERATIVE DIAGNOSIS:  Severe left internal carotid artery stenosis.   PROCEDURE:  Left carotid endarterectomy Dacron patch angioplasty.   CLINICAL NOTE:  [name] is a 60 year old male with history of heavy  tobacco use. He has previously undergone right carotid endarterectomy for  bilateral severe internal carotid artery stenosis. He is brought to the  operating at this time for elective left carotid endarterectomy. Risks the  operative procedure explained to the patient in detail, major morbidity and  mortality associated with this procedure 1-2% to include but not limited to  MI, CVA, cranial nerve injury and death.   OPERATIVE PROCEDURE:  The patient brought to the operating room stable  condition. Placed in supine position. General endotracheal anesthesia  induced. Foley catheter arterial line in place. Left neck prepped and draped  in sterile fashion.   Curvilinear skin incision made along the anterior border of left  sternomastoid muscle. Dissection carried down through subcutaneous tissue  with electrocautery. Platysma was divided. Deep dissection carried down  along the anterior border of the sternomastoid to the carotid to expose the  carotid sheath. The facial vein ligated with 2-0 silk and divided. The  carotid bifurcation exposed. The common carotid artery mobilized  down to the  omohyoid muscle and encircled with vessel loop. The external carotid and  superior thyroid encircled with vessel loops. The internal carotid artery  freed up to the posterior belly of the digastric muscle and encircled with  vessel loop. The hypoglossal nerve retracted superiorly and preserved. The  vagus nerve reflected posteriorly and preserved.   The carotid bifurcation revealed calcified plaque extending into the origin  of left internal carotid artery. The carotid vessels were noted to be  relatively small in caliber.   The patient administered 7000 units heparin intravenously. Adequate  circulation time permitted. Carotid vessels controlled with clamps.  Longitudinal arteriotomy made in the distal common carotid artery. The  arteriotomy extended across carotid bulb up into the internal carotid  artery. There was heavily calcified plaque present at the carotid  bifurcation and extending into the internal carotid artery. A high-grade  stenosis was present. Shunt was inserted.   An endarterectomy elevator used to remove the plaque. The endarterectomy  carried down into the common carotid artery where the plaque was divided  transversely with Potts scissors. The plaque then raised up and the bulb of  the superior thyroid and external carotid were endarterectomized using an  eversion technique. The distal internal carotid artery plaque then feathered  out well. Fragments of plaque removed with fine forceps. The site irrigated  with heparin saline solution.   A patch angioplasty and an endarterectomy site then carried out using  running 6-0 Prolene suture with a Finesse Dacron patch. At completion of the  patch angioplasty the shunt was removed. All vessels flushed. Clamps removed  directing initial antegrade flow up the external carotid artery, following  this, the internal carotid was released.   There was an excellent pulse and Doppler signal in the distal internal   carotid artery. The patient administered 50 milligrams of protamine  intravenously. Adequate hemostasis obtained.   Sponges and instrument counts correct. The sternomastoid fascia closed  running 2-0 Vicryl suture. Platysma closed running 3-0 Vicryl suture. Skin  closed with 4-0 Monocryl. Sterile dressings applied. The patient awakened in  the operating room. Neurologically intact. Transferred to recovery in stable  condition. No apparent complications.      Dorothea Glassman, M.D.  Electronically Signed     PGH/MEDQ  D:  06/08/2005  T:  06/08/2005  Job:  [id number]   cc:   Janifer Adie, M.D.  Fax: [phone]

## 2010-10-09 NOTE — Discharge Summary (Signed)
NAMESELESTINO, GRAINER                            ACCOUNT NO.:  [id number]   MEDICAL RECORD NO.:  [id number]                   PATIENT TYPE:   LOCATION:                                       FACILITY:   PHYSICIAN:  Corinna L. Conley Canal, MD             DATE OF BIRTH:  14-Oct-1950   DATE OF ADMISSION:  02/23/2003  DATE OF DISCHARGE:  02/25/2003                                 DISCHARGE SUMMARY   DIAGNOSES:  1. Ischemic colitis.  2. Colon polyps status post biopsy, pathology pending.  3. Hypertension.  4. Hyperlipidemia.  5. Tobacco abuse.  6. Lower gastrointestinal bleed secondary to above, resolved.   DISCHARGE MEDICATIONS:  1. Same as upon admission including Lipitor 10 mg p.o. daily.  2. Verapamil 240 mg p.o. daily.   DIET:  Low cholesterol, low salt.   FOLLOWUP:  Follow up with Ronald Lobo, M.D. in two to three weeks for  results of pathology.   ACTIVITY:  Ad lib.   CONDITION ON DISCHARGE:  Stable.   CONSULTS:  Ronald Lobo, M.D.   PROCEDURE:  Colonoscopy on February 25, 2003 showing mild to moderate  inflammatory changes involving the cecum consistent with resolving ischemia.  No gangrene.  Small sessile polyp which was biopsied.   PERTINENT LABORATORIES:  UA negative for nitrites, leukocyte esterase, or  blood.  Comprehensive metabolic panel was normal except for a bilirubin of  1.9.  Initial white blood cell count was 12.9.  At discharge it was normal.  Hemoglobin and hematocrit were normal on admission and remained so  throughout hospitalization.   HISTORY AND HOSPITAL COURSE:  Mr. Arseneau is a 60 year old patient of Janifer Adie, M.D. who presented with right lower quadrant pain and melena.  He had normal vital signs on admission, had some mild right lower quadrant  tenderness.  He had a CT of the abdomen and pelvis which showed right-sided  colitis.  No abscess.  His laboratories were as above.  The patient was  admitted to the floor.  He had stool  cultures done which are pending.  He  was started empirically on Cipro and Flagyl IV and kept on clear liquids.  Ronald Lobo, M.D. was  consulted and performed the above mentioned procedure.  He recommended  stopping the antibiotics and advancing diet and he will follow up as an  outpatient.  At the time of discharge patient was tolerating a regular diet  and his pain was much improved.  He felt that he would not require any  analgesics as an outpatient.                                               Corinna L. Conley Canal, MD    CLS/MEDQ  D:  02/24/2003  T:  02/25/2003  Job:  [id number]   cc:   Ronald Lobo, M.D.  [address], [place]  [place], [place] [address]  Fax: [phone]   Janifer Adie, M.D.  [address]  [place]  [place] [address]  Fax: [phone]

## 2010-10-09 NOTE — Consult Note (Signed)
NAMEMATHIAS, [name]                            ACCOUNT NO.:  [id number]   MEDICAL RECORD NO.:  [id number]                   PATIENT TYPE:  INP   LOCATION:  0466                                 FACILITY:  [facility]   PHYSICIAN:  Ronald Lobo, M.D.                DATE OF BIRTH:  10-23-1950   DATE OF CONSULTATION:  02/23/2003  DATE OF DISCHARGE:                                   CONSULTATION   REASON FOR CONSULTATION:  Dr. Montey Hora L. Conley Canal, covering for the [facility], asked me to see this 60 year old gentleman because of abdominal  pain and abnormal CT scan and rectal bleeding.   [name] was in his usual state of generally good health until two nights  ago, when, in the middle of the night, he developed rather diffuse low  abdominal pain and right lower quadrant pain.  It was not excruciating.  In  the morning, he had maroon and dark blood with his bowel movement, which was  somewhat loose or diarrheal in character.  He had a similar bowel movement  later in the day.  The pain persisted yesterday.  It was not associated with  fever, nausea, or vomiting.  He was seen at [facility] as  a work-in patient and thought to possibly have appendicitis based on a high  white count and apparently abdominal tenderness, prompting referral to the  [facility] Emergency Room where a CT scan showed significant thickening of  the cecum, and, to a lesser degree, the terminal ileum (films reviewed with  Randall Hiss A. Tery Sanfilippo, M.D.), with a normal appendix.  Overnight, the patient has  been treated with antibiotics and he feels currently about 40% better.   He has never had any similar symptoms to this.  There is no family history  of inflammatory bowel disease.  He does take an aspirin roughly every other  day, but he denies recent NSAID exposure.  His white count has basically  remained the same overnight, 12,900, but his hemoglobin had dropped slightly  with overnight  hydration, from 15.9 to 14.6.   The patient does smoke at least a pack per day.   There is also a history of hyperlipidemia.   ALLERGIES:  No known drug allergies.   OUTPATIENT MEDICATIONS:  1. Verapamil.  2. Lipitor.  3. Zofran.   OPERATIONS:  1. Hernia repair as a child.  2. Some knee surgery.   MEDICAL ILLNESSES:  1. Cluster headaches.  2. Hypertension.  3. Hyperlipidemia.   HABITS:  The patient smokes at least one to two packs per day and has what  sounds like sporadic alcohol usage.   FAMILY HISTORY:  Negative for GI tract illnesses.   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  His work schedule is somewhat erratic.  He is married and lives  with his  wife.   REVIEW OF SYSTEMS:  Negative for prodromal anorexia or weight loss,  dysphagia, or heartburn symptoms, or chronic abdominal pain.  He has a bowel  movement pretty much every day, sometimes skipping a day.  No problem with  antecedent diarrhea.   PHYSICAL EXAMINATION:  GENERAL:  This is a healthy-appearing gentleman in no  evident distress.  Neither anxious nor depressed.  VITAL SIGNS:  Normal.  He has been consistently afebrile since admission.  Pulse is 68, blood pressure 133/68.  CHEST:  A few rhonchi in the left lung region.  The right lung is clear.  HEART:  Normal.  ABDOMEN:  Normal bowel sounds, no bruits, no palpable organomegaly, and some  very mild subjective right lower quadrant and suprapubic or lower abdominal  tenderness, but the abdomen is soft and without guarding or certainly any  peritoneal findings at present.  RECTAL:  On admission, showed dark heme positive stool and was not repeated.  NEUROLOGIC:  The patient is alert, oriented, coherent, appropriate, and  without obvious focal motor deficits or cranial nerve deficits.   LABORATORY DATA:  White count 12,900 post hydration.  Hemoglobin 14.6,  platelets 210,000.  Differential count on admission  was 79 polys, 12 lymphs,  9 monocytes.  Metabolic panel on admission included BUN 9, creatinine 0.9,  total bilirubin 1.9, with other liver chemistries entirely normal, and an  albumin of 4 prior to hydration.   CT scan:  See above.   IMPRESSION:  Acute nonspecific colitis.  The differential diagnoses would  include localized ischemia, infection, new onset inflammatory bowel disease,  cancer.  This could conceivably also be sensor of non-steroidal anti-  inflammatory drugs (aspirin) induced colitis.   DISCUSSION AND PLAN:  Overall, I think all the diagnoses except for ischemic  colitis do not really fit the observed facts very well.  I have recommended  to the patient that he undergo colonoscopy at this time which would serve  the dual purpose of screening for colon cancer, but more especially, help to  define the etiology of his current clinical presentation.  Certainly, if it  happened to be cancer or inflammatory bowel disease, specific management  would be available and important to pursue.  If it is ischemia, it would be  an additional reason for encouraging the patient to stop smoking.  The  nature, purpose, and risks of the tests were reviewed with the patient along  with the  theoretically slightly increased risk of perforation to the degree that the  colon is currently inflamed.  He is agreeable to proceed and I feel that  this is the most advisable course of action.  It will be planned for  tomorrow.  Further management to depend on the patient's clinical evolution.                                               Ronald Lobo, M.D.    RB/MEDQ  D:  02/23/2003  T:  02/24/2003  Job:  [id number]   cc:   Janifer Adie, M.D.  [place]  [place] [address]  Fax: [phone]

## 2010-10-09 NOTE — Consult Note (Signed)
NAMETYKELL, LINEBACK NO.:  [id number]   MEDICAL RECORD NO.:  [id number]          PATIENT TYPE:  INP   LOCATION:  5707                         FACILITY:  [facility]   PHYSICIAN:  Larey Seat, M.D.  DATE OF BIRTH:  1951/02/19   DATE OF CONSULTATION:  10/16/2005  DATE OF DISCHARGE:  10/17/2005                                   CONSULTATION   Dr. Oneida Alar, from the [facility], called in consultation regarding a patient with  chief complaint of tremor ,which  the patient is experiencing now in all  extremities and truncally.   The patient had yesterday undergone stenting of the iliac artery to relieve  previously diagnosed vascular claudication.  When he returned to the floor from the procedure, he was tremulous, and the  tremor continued today.   He received at home some Amitriptyline since Novemberin attempt to help him  sleep. Dr. Verlon Setting,  his cardiologist, stopped the amitriptyline because he  was concerned that this might have already precipitated some of the tremors,  but it had little effect. Yesterday he was given some anesthetics for his  procedure.  I do not find that Fentanyl was part of the regimen.  He was, however, later given Reglan for nausea and responded with very high  blood pressures and a heart rate yesterday. The blood pressure exceeded [id number]  mmHg systolic and heart rate over 100 beats per minute.   The patient did state that he is a 6-beer-a-day drinker but that he also did  not drink anything between April and October last year without experiencing  any withdrawal symptoms.  He had stopped using alcohol because of cluster  headaches that developed in that period of time.   PAST MEDICAL HISTORY:  Positive for:  1.  Hypertension.  2.  Cluster headaches.  3.  The tremor was present since November 2006,  now  exacerbated.  The      November onset he relates to a carotid endarterectomy surgery.   SOCIAL HISTORY:  The patient drinks beers a day.  He  quit smoking in  October, uses nicotine lozenges. He is married, lives with his wife, full-  time gainfully employed.   MEDICATIONS AT HOME:  1.  Vitorin.  2.  Labetalol.  3.  Lisinopril.  4.  Hydrochlorothiazide.  5.  Aspirin 81 mg.  6.  Plavix.  7.  Nicotine.  8.  Nitroglycerin p.r.n.   ASSESSMENT:  The patient has lung clear to auscultation.  Cor regular rate  and rhythm.  No murmur.  No carotid bruit.  The patient had a soft,  nontender abdomen.  No peripheral clubbing is noted.  No edema.  There is  also no bruising noted.   Mental status alert and oriented x3.  Fluent speech.  No tremor in the  voice, no hesitation when he speaks, no dysphagia, dysarthria or dysphagia.  Cranial nerve examination shows no facial or tongue trauma, full extraocular  movements.  I did not elicit a nystagmus. Visual fields: He did not have any  deficits, and his neck is supple. The face appears  symmetrically shaped, and  there is no sensory loss in the extremities or face described.   Motor examination: The patient has a 5/5 grip strength, bilaterally very  good grip, normal muscle tone, and symmetry.  I did not see fasciculations  at the muscle surface. The tremor becomes evident at rest already but  exacerbates with action such as a finger-nose test.  There was no  cogwheeling underlying the tremor.  Finger-nose test shows dysmetria and ataxia.  Deep tendon reflexes are very brisk and respond almost in a myoclonic  fashion   ASSESSMENT:  1.  I believe that this patient might have a component of alcohol withdrawal      given his high blood pressure, his heart rate, and his myoclonus I would      suggest phenobarbital for the tremor treatment as it also covers for      possible withdrawal component, a dose of 60 mg 3 times a day p.o. should      be fine.  Phenobarbital has a very long half life, and when the patient      goes home, it is normally enough to give him 1 pill at night for 21       days.  He can get his first dose right now in the morning.  It should      reduce or resolve the tremor within 24 hours.  2.  I would cancel the head CT.  I have crossed it already out of the      orders.  3.  No Reglan of Phenergan should be given for nausea as this will make the      tremor worse again.  4.  I would recommend at home a multivitamin with thiamine and folic acid to      be used. A vitamin B complex or in some patients a prenatal vitamin is a      with good idea to cover all these basis.   I thank Dr. Oneida Alar for the consultation.   Sincerely, Dr. Asencion Partridge Dohmeier      Larey Seat, M.D.  Electronically Signed     CD/MEDQ  D:  10/16/2005  T:  10/17/2005  Job:  [id number]   cc:   Jessy Oto. [place], [place], [place] [address]   P. Drucie Opitz, M.D.  [address]  [place]  [place] [address]

## 2010-10-09 NOTE — Discharge Summary (Signed)
[name], [name]                ACCOUNT NO.:  [id number]   MEDICAL RECORD NO.:  [id number]          PATIENT TYPE:  OIB   LOCATION:  [id number]                         FACILITY:  [facility]   PHYSICIAN:  Dorothea Glassman, M.D.    DATE OF BIRTH:  [birth year]   DATE OF ADMISSION:  06/07/2005  DATE OF DISCHARGE:  06/08/2005                                 DISCHARGE SUMMARY   HISTORY OF PRESENT ILLNESS:  The patient is a 60 year old white male with a  past medical history significant for coronary artery disease and heavy  tobacco abuse.  He was referred to Dr. Amedeo Plenty for evaluation of abnormal  carotid Dopplers and subsequent CT angiogram which revealed bilateral  internal carotid artery stenosis.  The patient had a right carotid  endarterectomy on November 21, 123456, without complications and was admitted  this hospitalization for the second of his staged procedures.   PAST MEDICAL HISTORY:  1.  Extracranial cerebrovascular disease, bilateral, status post right      carotid endarterectomy.  2.  History of heavy tobacco abuse.  3.  History of coronary artery disease.  4.  History of hypertension.  5.  History of hyperlipidemia.  6.  COPD.  7.  History of cluster headaches.   PAST SURGICAL HISTORY:  1.  Right carotid endarterectomy in November 2006.  2.  Right knee surgery in the 1980s.  3.  Right inguinal hernia.  4.  Tonsillectomy.   ALLERGIES:  No known drug allergies.   MEDICATIONS PRIOR TO ADMISSION:  Verapamil 120 mg b.i.d., Labetalol 200 mg  daily, Lisinopril/hydrochlorothiazide 20/12.5 mg daily, Vytorin 10/20 one  daily, aspirin 325 mg daily, Imdur 30 mg daily, Topamax 25 mg 2 q.h.s.,  Amitriptyline 25 mg p.o. p.r.n., Plavix 75 mg daily.   For family history, social history, review of systems, and physical exam,  please see the history and physical done at the time of admission.   HOSPITAL COURSE:  The patient was admitted electively on June 07, 2005,  and taken to the operating  room at which time he underwent a left carotid  endarterectomy with Dacron patch angioplasty.  He tolerated the procedure  well and was taken to the post anesthesia care unit intensive care unit in  stable condition.  Postoperative hospital course, the patient has done well.  He has remained hemodynamically stable.  His laboratory values were stable  with a mild postoperative anemia of hemoglobin 10 and hematocrit 30.  Electrolytes, BUN, and creatinine are all within normal limits.  Neurologically, he is intact with no focal findings.  The incision is  healing well without evidence of bleeding or infection.  He is tolerating a  gradual increase in activity commensurate with the level of postoperative  convalescence using routine protocols.  His oxygen has been weaned and he  maintains good saturations on room air.  His overall status is felt to be  quite stable for discharge on today's date June 08, 2005.   CONDITION ON DISCHARGE:  Stable and improved.   FINAL DIAGNOSIS:  Severe left carotid artery stenosis now status post  carotid endarterectomy as described above.   DISCHARGE INSTRUCTIONS:  The patient received written instructions regarding  medications, activity, diet, wound care, and follow up.  Follow up will  include Dr. Amedeo Plenty in two weeks post discharge.   DISCHARGE MEDICATIONS:  As preoperatively, additionally for pain, Tylox 1-2  every 4-6 hours as needed.      [name], P.A.-C.      Dorothea Glassman, M.D.  Electronically Signed    WEG/MEDQ  D:  06/08/2005  T:  06/08/2005  Job:  [id number]   cc:   Janifer Adie, M.D.  Fax: [phone]   Champ Mungo. Lovena Le, M.D.  [address] [place]  [place] [address]

## 2010-10-09 NOTE — H&P (Signed)
NAMEKEYANTE, KHOURY NO.:  [id number]   MEDICAL RECORD NO.:  [id number]          PATIENT TYPE:  INP   LOCATION:  1824                         FACILITY:  [facility]   PHYSICIAN:  Darlin Coco, M.D. DATE OF BIRTH:  1950-08-09   DATE OF ADMISSION:  01/16/2006  DATE OF DISCHARGE:                                HISTORY & PHYSICAL   CHIEF COMPLAINT:  Chest pain.   HISTORY:  This is a 60 year old married Caucasian gentleman admitted to the  emergency room with chest pain.  The patient has a history of known coronary  artery disease.  He had cardiac catheterization March 16, 2005 at which  time was found a 40-50% proximal LAD lesion which was treated medically.  Since then the patient has also had bilateral carotid endarterectomies and  has had two stents in his left thigh followed by Dr. Drucie Opitz.  Today the  patient was watching golf on TV and drinking a beer when he developed left-  sided chest pain.  He took sublingual nitroglycerin with gradual relief over  20 minutes.  This is the first chest pain he had since his cath in October  2006.  The discomfort was in the left precordium, it did not radiate, it was  not associated any nausea, vomiting or diaphoresis or dyspnea.   PAST HISTORY:  Positive for hypercholesterolemia and essential hypertension  and he had a recent hospitalization after a fall and had significant  epistaxis and was found to be significantly hyponatremic.  It was at that  point that his hydrochlorothiazide was stopped.   SOCIAL HISTORY:  The patient is married.  They have no children.  The  patient quit smoking at the time of his cardiac cath in October 2006.  He  does drink moderate alcohol in the form of beer.  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   ALLERGIES:  NO KNOWN DRUG ALLERGIES.   OPERATIONS:  He has had arthroscopic surgery of right knee and also right  inguinal hernia.   FAMILY HISTORY:  Family history reveals father  and mother are both still  living in their 37s and the mother is in a memory ward.   REVIEW OF SYSTEMS:  GASTROINTESTINAL:  Past history of some GI bleeding 3  years ago and was treated at that time by Dr. Cristina Gong.  GENITOURINARY:  No  urinary symptoms except for a slow stream.  RESPIRATORY:  No bronchitis.   Remainder of review of systems is negative in detail.   PHYSICAL EXAMINATION:  VITAL SIGNS:  Blood pressure is 138/79, pulse 64  regular, respirations are normal.  HEENT:  No carotid bruits.  Jugular venous pressure normal.  Thyroid normal.  He has bilateral carotid endarterectomy scars.  CHEST:  The chest is clear to auscultation.  HEART:  The heart reveals a quiet precordium without murmur, gallop, rub or  click.  ABDOMEN:  The abdomen is soft.  The liver and spleen are not enlarged.  EXTREMITIES:  The extremities show good peripheral pulses.  No edema.  No  phlebitis.   STUDIES:  His electrocardiogram shows normal sinus rhythm and is within  normal limits.  Chest x-ray shows COPD but no acute change.  Serum sodium  127, potassium 4.1, BUN 10, creatinine 0.9.  Point-of-care cardiac enzymes  negative x2.   IMPRESSION:  1. Chest pain, rule out myocardial infarction.  2. Known ischemic heart disease with a previous proximal left anterior      descending lesion noted at catheterization October 2006.  3. Hypertensive cardiovascular disease.  4. Hypercholesterolemia.  5. Status post bilateral carotid endarterectomies.  6. Status post stenting of arteriolar narrowing in left leg.   DISPOSITION:  The patient is being admitted to Dr. Angelena Sole.  We will  get serial enzymes and EKGs.  He will be treated with IV heparin, if pain  recurs we will add IV nitrates, we will continue Plavix and continue  labetalol.  Possible catheterization in the morning by Dr. Verlon Setting.           ______________________________  Darlin Coco, M.D.     TB/MEDQ  D:  01/16/2006  T:  01/16/2006   Job:  [id number]   cc:   Kaylyn Lim., M.D.  Janifer Adie, M.D.  Dorothea Glassman, M.D.

## 2010-10-09 NOTE — Discharge Summary (Signed)
Best, [name]                ACCOUNT NO.:  [id number]   MEDICAL RECORD NO.:  [id number]          PATIENT TYPE:  OIB   LOCATION:  [id number]                         FACILITY:  [facility]   PHYSICIAN:  Dorothea Glassman, M.D.    DATE OF BIRTH:  [birth year]   DATE OF ADMISSION:  04/13/2005  DATE OF DISCHARGE:  04/14/2005                                 DISCHARGE SUMMARY   ADMISSION DIAGNOSIS:  Severe bilateral internal carotid artery stenosis.   SECONDARY DIAGNOSIS:  1.  Severe bilateral internal carotid artery stenosis status post right      carotid endarterectomy  2.  Hypertension.  3.  Hyperlipidemia.  4.  Coronary artery disease.  5.  Chronic obstructive pulmonary disease.  6.  Tobacco abuse.  7.  History of cluster headaches.  8.  Right knee surgery in 1980s.  9.  Right inguinal hernia repair as a child.  10. History of tonsillectomy as a child.   ALLERGIES:  No known drug allergies.   PROCEDURE:  On April 13, 2005 right carotid endarterectomy with Dacron  patch angioplasty by Dr. Gordy Clement.   HISTORY OF PRESENT ILLNESS:  [name] is a 60 year old Caucasian male who  was referred to Dr. Amedeo Plenty for evaluation of abnormal carotid Doppler's.  Doppler evaluation and CT angiography verified severe bilateral internal  carotid artery stenosis.  Risk factors included heavy tobacco abuse.  He is  also known for coronary artery disease and underwent a cardiac  catheterization by Dr. Angelena Sole of [facility].  Reported  findings showed diffusely disease right coronary system with no major  disease in the left system.  He has been treated medically.  When Dr. Amedeo Plenty  saw him in consultation he did feel that he would benefit from staged  bilateral carotid endarterectomies.  Since [name] was left-handed Dr.  Amedeo Plenty chose to pursue right carotid endarterectomy as initial procedure.  After discussing risks, benefits and alternatives [name] agreed to  proceed.  [name] was asymptomatic of his carotid artery disease.   HOSPITAL COURSE:  On April 13, 2005 [name] was electively admitted  to [facility] and did undergo a right carotid endarterectomy to  reduce his risk for future stroke.  There was no known intraoperative  complications and he was extubated neurologically intact.  After a short  stay in the recovery unit he was transferred to stepdown unit 3300 with  routine postoperative carotid endarterectomy orders.  On postoperative day  one he remained hemodynamically stable with blood pressure 125/50, heart  rate in the 50's and regular.  He was saturating 90% on room air.  He  remained neurologically intact and his neck incision was clean, dry and  intact without evidence of hematoma.  Postoperative labs were also stable  showing sodium 137, potassium 3.4, blood glucose 113, BUN 9, creatinine 0.9.  White blood cell count 8.5, hemoglobin 12.3, hematocrit 35.1, platelet count  190.  By this time his Foley catheter and arterial line had both been  removed.  It was felt that if he was able to  ambulate without difficultly,  tolerating oral diet and void upon removal of his Foley catheter that he  would be stable for discharge to home on postoperative day one, April 14, 2005.   DISCHARGE MEDICATIONS:  1.  Percocet 5/325 1-2 tablets p.o. q.4h. p.r.n. pain.  2.  Verapamil 120 mg p.o. b.i.d.  3.  Labetalol 200 mg daily.  4.  Lisinopril/hydrochlorothiazide 20/25 mg p.o. daily.  5.  Vytorin 10/20 mg one p.o. daily.  6.  Aspirin 325 mg p.o. daily.  7.  Imdur 30 mg p.o. daily.  8.  Fentanyl __________  one p.o. p.r.n.  9.  Topamax 25 mg two tablets p.o. q.h.s.  10. Dr. Amedeo Plenty instructed him that he did not need to continue his Plavix      from a vascular standpoint.   DISCHARGE INSTRUCTIONS:  He is instructed to avoid driving or heavy lifting  for the next two weeks.  He may increase his activity slowly.  Was  encouraged to  continue daily walking exercises.  He is to follow a lot fat  and low salt diet.  He may shower starting April 15, 2005.  He should  avoid lotions directly on the incision site and should call if he develops  fever greater than 101, redness or drainage from his incision.  He is to  followup with Dr. Amedeo Plenty at the [facility] office in two weeks and the office will  contact him regarding a specific appointment date and time.  He should call  sooner if needed.      Jacinta Shoe, P.A.      Dorothea Glassman, M.D.  Electronically Signed    AWZ/MEDQ  D:  04/14/2005  T:  04/14/2005  Job:  [id number]   cc:   Janifer Adie, M.D.  Fax: [phone]   Kaylyn Lim., M.D.  Fax: [phone]

## 2010-10-09 NOTE — Consult Note (Signed)
[name], [name]                ACCOUNT NO.:  [id number]   MEDICAL RECORD NO.:  [id number]          PATIENT TYPE:  INP   LOCATION:  3316                         FACILITY:  [facility]   PHYSICIAN:  Jefry H. Constance Holster, MD     DATE OF BIRTH:  [birth year]   DATE OF CONSULTATION:  12/09/2005  DATE OF DISCHARGE:                                   CONSULTATION   SITE:  [facility].   REASON FOR CONSULTATION:  Epistaxis and nasal trauma.   HISTORY:  This is a 60 year old who has been having problems with falling,  blacking out, and possibly with seizures recently.  He has refused coming to  the hospital on several occasions.  Yesterday, he fell on his face and hit  his nose and has had severe epistaxis since then, it seemed to be worse on  the right side.  He is being admitted to the hospital for workup of possible  seizures and hyponatremia as well as anemia.  His hemoglobin was 8.7 on  admission and he is being transfused packed red cells.  He has had problems  with nosebleeds in the past, particularly when he was started on daily  Plavix and aspirin and since then, his Plavix has been changed to every  other day and he continues on a baby aspirin daily for severe cardiovascular  disease.  Since the injury, his nose does not seem to be crooked on the  outside and he did not have any problems breathing through his nose prior to  the injury.   PHYSICAL EXAMINATION:  Generally healthy appearing gentleman in no distress.  There is some gauze folded up packed in the right nasal cavity that was  removed.  Large blood clots were removed from both nasal cavities.  Topical  Afrin and Xylocaine were applied and a thorough inspection of the nasal  cavities was performed.  There is a deviation of the septum towards the left  but no obvious acute septal fracture.  There is a small mucosal tear in the  superior aspect of the nasal vault at approximately where the piriform  aperture is on  the right side and there is some mild active bleeding from  that area.  This area was cauterized with silver nitrate until there was no  further bleeding.  No other obvious bleeding site was identified.  The nasal  dorsum reveals abrasions and contusions, but no lacerations and the nasal  dorsum is symmetric to inspection and there is no palpable bony step-off or  acute displaced fracture noted.  Oral cavity and pharynx are clear.   ASSESSMENT/PLAN:  Status post nasal trauma with epistaxis, seems to be  resolved now.  Will continue to observe while in the hospital and if any  further bleeding occurs will be available to see him.      Jefry H. Constance Holster, MD  Electronically Signed     JHR/MEDQ  D:  12/09/2005  T:  12/09/2005  Job:  [id number]

## 2010-10-09 NOTE — Op Note (Signed)
[name], [name]                ACCOUNT NO.:  [id number]   MEDICAL RECORD NO.:  [id number]          PATIENT TYPE:  OIB   LOCATION:  [id number]                         FACILITY:  [facility]   PHYSICIAN:  Dorothea Glassman, M.D.    DATE OF BIRTH:  29-Sep-1950   DATE OF PROCEDURE:  04/13/2005  DATE OF DISCHARGE:                                 OPERATIVE REPORT   SURGEON:  Gordy Clement, MD   ASSISTANT:  Jacinta Shoe, P.A.   ANESTHETIC:  General endotracheal.   ANESTHESIOLOGIST:  Finis Bud, M.D.   PREOPERATIVE DIAGNOSIS:  Severe bilateral internal carotid artery stenosis.   POSTOPERATIVE DIAGNOSIS:  Severe bilateral internal carotid artery stenosis.   PROCEDURE:  Right carotid endarterectomy, Dacron patch angioplasty.   CLINICAL NOTE:  [name] is a 60 year old male referred for evaluation of  the abnormal carotid Dopplers.  Doppler evaluation and CT angiography  verified severe bilateral internal carotid artery stenosis.  He does have a  history of heavy tobacco use.  It was recommended he undergo carotid  endarterectomy for reduction of stroke risk.  He is left-handed.  Right  carotid endarterectomy carried ut at this time for reduction of stroke risk.  Risks and benefits of the operative procedure explained to the patient in  detail.  Major morbidity and mortality associated this procedure 1-2% to  include but not limited to MI, CVA, cranial nerve injury and death.   OPERATIVE PROCEDURE:  The patient brought to the operating room in stable  condition.  Placed in supine position.  General endotracheal anesthesia  induced.  Foley catheter, arterial line in place.  Right neck prepped and  draped in a sterile fashion.   Curvilinear skin incision made along the anterior border of the right  sternomastoid muscle.  Dissection carried down through subcutaneous tissue  and platysma with electrocautery.  Deep dissection carried down to expose  the carotid sheath.  The facial  vein ligated with 2-0 silk and divided.  The  carotid bifurcation exposed.  The common carotid artery mobilized down to  the omohyoid muscle and encircled with a vessel loop.  The vagus nerve  reflected posteriorly and preserved.  The origin of the superior thyroid and  external carotid were freed and encircled with vessel loops.  The internal  carotid artery followed distally beyond the plaque disease and encircled  with a vessel loop.   Evaluation of the carotid bifurcation revealed calcified plaque with an area  of significant plaque at the origin of the right internal carotid artery.   The patient administered 7000 units heparin intravenously.  Adequate  circulation time permitted.  The carotid vessels controlled with clamps.  Longitudinal arteriotomy made in the distal common carotid artery.  The  arteriotomy extended across the carotid bulb and up into the internal  carotid artery.  There was a large plaque at the origin of the right  internal carotid artery with a high-grade stenosis.  A shunt was inserted.   The plaque removed with an endarterectomy elevator.  The endarterectomy  carried down into the common carotid artery, where the  plaque was divided  transversely with Potts scissors.  Plaque then raised up into the bulb and  the superior thyroid and external carotid were endarterectomized using an  eversion technique.  The internal carotid artery plaque then feathered out  distally.  Fragments of plaque removed with fine forceps.  The site  irrigated with heparin and saline solution.   A patch angioplasty at the endarterectomy site carried out with a running 6-  0 Prolene suture using a Finesse Dacron patch.  At completion of the patch  angioplasty, the shunt was removed.  All vessels were well-flushed.  Clamps  were removed directing initial antegrade flow up the external carotid  artery, following this the internal carotid was released.   Adequate hemostasis obtained.   Sponge and instrument counts correct.  The  patient administered 50 mg of protamine intravenously.  Excellent Doppler  signal present in the internal carotid artery.   Sternomastoid fascia closed with a running 2-0 Vicryl suture.  Platysma  closed with running 3-0 Vicryl suture.  Skin closed with 4-0 Monocryl.  Steri-Strips applied.  Sterile dressing applied.  Anesthesia reversed the  operating room, the patient awakened readily, moved all extremities to  command. Transferred to the recovery in stable condition.  No apparent  complications.      Dorothea Glassman, M.D.  Electronically Signed     PGH/MEDQ  D:  04/13/2005  T:  04/14/2005  Job:  [id number]   cc:   Janifer Adie, M.D.  Fax: [phone]   Kaylyn Lim., M.D.  Fax: [phone]

## 2010-10-09 NOTE — Op Note (Signed)
[name], [name]                ACCOUNT NO.:  [id number]   MEDICAL RECORD NO.:  [id number]          PATIENT TYPE:  OIB   LOCATION:  [id number]                         FACILITY:  [facility]   PHYSICIAN:  Dorothea Glassman, M.D.    DATE OF BIRTH:  [birth year]   DATE OF PROCEDURE:  10/15/2005  DATE OF DISCHARGE:                                 OPERATIVE REPORT   DIAGNOSIS:  Left lower extremity claudication.   SURGEON:  Dorothea Glassman, M.D.   PROCEDURE:  1.  Abdominal aortogram with bilateral lower extremity runoff arteriography.  2.  Percutaneous transluminal angioplasty and stenting of left common iliac      artery.  3.  Percutaneous transluminal angioplasty of left external iliac artery.  4.  Pressure gradient measurement left common iliac to external iliac      artery.   ACCESS:  Bilateral common femoral arteries, 5-French sheath right, 7-French  sheath left.   CONTRAST:  280 mL Visipaque.   COMPLICATIONS:  None apparent.   CLINICAL NOTE:  [name] is a 60 year old male with known peripheral  vascular disease.  He has typical claudication symptoms in his left lower  extremity.  Arterial Doppler evaluation was consistent with a left iliac  disease.  He is brought to the catheterization lab at this time for  diagnostic arteriography and planned intervention.  Risks of the  intervention including major morbidity and mortality of 1% discussed with  the patient prior to the procedure.   DESCRIPTION OF PROCEDURE:  The patient was brought to the catheterization  lab in stable condition and placed in the supine position.  Both groins  prepped and draped in a sterile fashion.  The patient administered 1 mg of  Versed and 50 mcg femoral intravenously.   Skin and subcutaneous tissue left groin instilled with 1% Xylocaine.  A  needle was easily introduced into the left common femoral artery.  0.35  Magic torque guidewire advanced through the needle into the mid abdominal  aorta.  The needle  was removed and a 6-French sheath advanced over the  guidewire.  The sheath flushed with heparin and saline solution.  A Pigtail  catheter advanced over the guidewire to the mid abdominal aorta.  Standard  AP mid abdominal aortogram obtained.  This revealed atherosclerotic change  of the infrarenal aorta.  There was no significant aortic stenosis in the  infrarenal segment.  There was a plaque at the origin of left common iliac  artery, this was a complex plaque with a high-grade stenosis just beyond the  origin of left common iliac artery.  The right common iliac artery revealed  mild stenosis at its origin.  It was patent beyond this.   A magnified view of the renal arteries revealed a moderate stenosis of the  right renal artery estimated to be 50%.  A moderate stenosis of left renal  artery estimated to be 50-70%.   The pigtail catheter was then brought down to the aortic bifurcation.  AP  and bilateral oblique pelvic arteriography obtained.  This verified a  complex plaque at the origin  of the left common iliac artery with a high-  grade stenosis.  There was also a stenosis evident at the origin of the left  external iliac artery.  There was continued flow to the common femoral level  bilaterally.  Lower extremity runoff arteriography obtained.  This verified  the common femoral artery be widely patent bilaterally.  The profunda  femoris artery was intact bilaterally.  The superficial femoral artery was  patent bilaterally with mild disease.  The popliteal artery was intact  bilaterally.  There was three-vessel tibial artery runoff present  bilaterally.   The right common femoral artery was then accessed.  Skin and subcutaneous  tissue instilled with 1% Xylocaine.  A needle was easily induced into right  common femoral artery.  0.035 Magic torque guidewire advanced through the  needle into the mid abdominal aorta.  The needle was removed and a short 5-  [place] sheath advanced over  the guidewire.  This provided access for safety  purposes in the right common iliac artery.  A pigtail catheter was then  advanced over the right femoral guidewire and positioned at the aortic  bifurcation.  An exchange was then made in the left groin for a 7-French  sheath.  This was a long 7-French sheath.  The 7-French sheath was advanced  up across the aortic bifurcation.  Contrast was then injected through the 7-  French sheath on the left and  verified a high-grade stenosis of the left  common iliac artery.  A [id number] stent was then deployed at 10  atmospheres for 30 seconds.  Following this, the balloon was removed and a  completion arteriogram obtained of the remainder residual stenosis  proximally and the common iliac artery.  Therefore a second stent, [id number]  [id number] was deployed at 10 atmospheres for 30 seconds proximal to this.  This  resulted in an excellent technical result in the proximal left common iliac  artery stenosis.   A pressure gradient was then drawn back across the left common iliac into  the left external iliac artery.  There was a 20 mm pressure gradient at  rest.  Therefore the left external iliac artery stenosis was felt to be  significant.  This was dilated with a 6 x 1.5 Powerflex balloon at 10  atmospheres times 30 seconds.  At completion of this, a repeat pressure  gradient was carried out.  There was residual gradient still of 15 mmHg.  Therefore a 7 x 2 Powerflex balloon was inflated to 10 atmospheres times 30  seconds.  At completion of this, a repeat pressure gradient revealed less  than 10 mmHg.  There was minimal dissection present.  Stenting was avoided  due to the adjacent origin of the hypogastric artery.   This completed the procedure.  There were no apparent complications.  The  patient did receive 5000 units heparin prior to intervention and had an ACT  measured at 277 seconds.  Guidewires were then removed, and sheaths will be removed when  ACT  appropriate.   FINAL IMPRESSION:  1.  Moderate bilateral renal artery stenosis.  2.  Infrarenal aortic atherosclerosis without dominant stenosis.  3.  Successful dilatation of high-grade left common iliac artery stenosis.  4.  Successful dilatation of the left external iliac artery origin stenosis.   DISPOSITION:  These results been reviewed with the patient and family.  The  patient will be followed up in the office on elective basis in 2-3 weeks.  Dorothea Glassman, M.D.  Electronically Signed     PGH/MEDQ  D:  10/15/2005  T:  10/16/2005  Job:  [id number]   cc:   Kaylyn Lim., M.D.  Fax: [facility]

## 2010-10-09 NOTE — H&P (Signed)
[name], [name]                ACCOUNT NO.:  [id number]   MEDICAL RECORD NO.:  [id number]          PATIENT TYPE:  EMS   LOCATION:  MAJO                         FACILITY:  [facility]   PHYSICIAN:  Corinna L. Conley Canal, MDDATE OF BIRTH:  12/19/1950   DATE OF ADMISSION:  03/14/2005  DATE OF DISCHARGE:                                HISTORY & PHYSICAL   CHIEF COMPLAINT:  Chest pain.   HISTORY OF PRESENT ILLNESS:  Mr. Donini is a 60 year old white male patient  of Dr. Bradd Burner who presents to the emergency room with periodic chest pain  over the past days. He describes it as a pressure, about 3 to 5 out of 10.  Two days ago it lasted for a few seconds. Yesterday a minute or 2. Today  several hours. There were no exacerbating or ameliorating factors. He has no  history of coronary artery disease. His cardiac risk factors are smoking,  male sex, age, hyperlipidemia, hypertension. He had no accompanying dyspnea,  no nausea or vomiting. No palpitations or diaphoresis. He has never had a  stress test.   PAST MEDICAL HISTORY:  1.  Cluster headaches.  2.  Hypertension.  3.  Hyperlipidemia.  4.  Tobacco abuse.  5.  History of ischemic colitis.  6.  History of colon polyps.   MEDICATIONS:  1.  Vytorin 10/20 mg daily.  2.  Imitrex injections p.r.n.  3.  Lisinopril-hydrochlorothiazide 20/25 mg daily.  4.  Labetalol 200 mg p.o. b.i.d.  5.  Verapamil ER 240 mg p.o. b.i.d.  6.  Aspirin 81 mg p.o. daily.   SOCIAL HISTORY:  The patient smokes about two packs a day. He denies drug  use. He drinks an occasional beer. He is here with his wife. He works a  Physiological scientist.   FAMILY HISTORY:  Significant for coronary artery disease in his father in  his 29's. His mother had carotid disease. They both had hypertension and  hyperlipidemia.   REVIEW OF SYSTEMS:  CONSTITUTIONAL:  No fevers, chills, weight loss. HEENT:  He has been suffering from headaches recently but has none currently.  RESPIRATORY:  No shortness of breath. He has a chronic smoker's cough which  is unchanged. CARDIOVASCULAR:  As above.  GI: As above.  GU:  No dysuria or  hematuria. MUSCULOSKELETAL:  He does describe leg pain. His wife reports leg  pain after walking about a block at which time he has to stop and rest.  HEMATOLOGY:  No history of thromboembolism but patient has taken a trip to  [place] by plane 3 weeks ago. He has had no recent leg swelling or pain other  than as mentioned above. ENDOCRINE:  No diabetes. NEUROLOGIC:  No history of  stroke or seizure, otherwise as above. PSYCHIATRIC:  No depression. SKIN:  No rash.   PHYSICAL EXAMINATION:  VITAL SIGNS:  Temperature is 97.6, blood pressure  initially 189/95, currently 117/55, pulse 72, respiratory rate 16, pulse  oximetry 96% on room air.  GENERAL:  The patient is well-nourished, well-developed, in no acute  distress.  HEENT:  Normocephalic, atraumatic. Pupils are  equal, round and reactive to  light. Sclerae nonicteric. Moist mucous membranes.  NECK:  Supple. He has bilateral carotid bruits. No thyromegaly or  lymphadenopathy.  LUNGS:  Are clear to auscultation bilaterally without wheezes, rhonchi or  rales.  CARDIOVASCULAR:  Regular rate and rhythm with quiet heart tones, no murmurs,  gallops, rubs.  ABDOMEN:  Normal bowel sounds, soft, nontender, nondistended.  GU/RECTAL:  Deferred.  EXTREMITIES:  No clubbing, cyanosis or edema. Pedal pulses are intact.  SKIN:  No rash.  PSYCHIATRIC:  Normal affect.  NEUROLOGIC:  Alert and oriented. Cranial nerves and sensory, motor exam are  intact.   LABORATORY DATA:  Two sets of point of care enzymes are normal. CBC is  unremarkable. Complete metabolic panel is significant for a sodium of 133,  otherwise unremarkable. EKG shows normal sinus rhythm. Chest x-ray shows  COPD.   ASSESSMENT/PLAN:  1.  Chest pain: Given his cardiac risk factors, patient will be admitted to      telemetry. Myocardial  infarction will be ruled out by serial cardiac      enzymes. I discussed the case with Dr. Verlon Setting who was on call with [facility]. He agrees to supervise a Education administrator. Given his      claudication, he will most likely need an Adenosine-Cardiolite. I will      hold his beta blocker and calcium channel blocker for the test and make      him n.p.o. after midnight. Currently he has no chest pain.  2.  Hypertension. See above. I will give hydralazine as needed if his blood      pressure becomes uncontrolled off his calcium channel blocker and beta      blocker.  3.  Hyperlipidemia. I will check fasting lipids tomorrow.  4.  Tobacco abuse, counseled against. The patient does request a nicotine      patch but wants a small dose due to his history of cluster headaches. I      will start him on 7 mg daily and ask for a smoking cessation counselor      consult.  5.  Claudication. His pulses are quite strong, however, we should consider      arterial leg Doppler possibly as an outpatient.  6.  History of cluster headaches.  7.  History of ischemic colitis and GI bleed from same.  8.  Carotid bruits: The patient reports that he had carotid Doppler's      several years ago.  I will defer this to Dr. Bradd Burner.      Corinna L. Conley Canal, MD  Electronically Signed     CLS/MEDQ  D:  03/14/2005  T:  03/14/2005  Job:  [id number]   cc:   Janifer Adie, M.D.  Fax: [phone]

## 2010-10-09 NOTE — Procedures (Signed)
PROCEDURE:  EEG.   CLINICAL HISTORY:  A 60 year old man with syncopal episode. EEG is performed  for evaluation.   DESCRIPTION OF PROCEDURE:  The dominant rhythm in this tracing is a moderate  amplitude alpha rhythm of 9 to 10 hertz, which predominates posteriorly. It  appears without abnormal asymmetry and attenuates with eye opening and  closing. Low amplitude fast activity is seen frontally and centrally and  appears without abnormal asymmetry. No focal slowing is noted. No  epileptiform discharges are seen. The patient remained in the awake state  throughout the recording. Photic stimulation produced symmetric drive and  responses. Hyperventilation produced no significant change in the background  rhythms. Single channel devoted to EKG. Revealed sinus rhythm throughout  with a rate of approximately 78 beats per minute.   CONCLUSION:  Normal study in awake state.      Michael L. Doy Mince, M.D.  Electronically Signed     [id number]  D:  12/12/2005 11:46:04  T:  12/12/2005 22:39:34  Job #:  [id number]

## 2010-10-09 NOTE — Cardiovascular Report (Signed)
[name], Best NO.:  [id number]   MEDICAL RECORD NO.:  [id number]          PATIENT TYPE:  INP   LOCATION:  [id number]                         FACILITY:  [facility]   PHYSICIAN:  Kaylyn Lim., M.D.DATE OF BIRTH:  10-23-1950   DATE OF PROCEDURE:  03/16/2005  DATE OF DISCHARGE:                              CARDIAC CATHETERIZATION   INDICATION FOR PROCEDURE:  Chest pain and abnormal stress test.   PROCEDURE DESCRIPTION:  The patient was brought to the cardiac cath lab  after appropriate informed consent.  He was prepped and draped in sterile  fashion. Approximately 10 cc of 1% lidocaine was used for local anesthesia.  A 6-French sheath was placed in right femoral artery without difficulty. A  right femoral angiogram was performed that showed the course of the right  femoral artery due to moderate calcification. This did show a 50% mid  stenosis in the common right femoral artery.  The wire passed easily past  the stenosis and wire exchanges were then done using over-the-wire catheter  exchange. Coronary angiography, LV angiography were then performed without  difficulty.   FINDINGS:  1.  Left main:  Mild calcification with moderate luminal irregularities and      a distal 20-30% stenosis.  2.  LAD:  Proximal 40-50% stenosis with mild to moderate luminal      irregularities.  3.  D1 and D2:  Mild luminal irregularities.  4.  LCX:  Nondominant with mild to moderate luminal irregularities.  5.  OM-1 and OM-2:  Small vessels with mild luminal irregularities.  6.  RCA:  Dominant, small vessel, less than 2 mm, with diffuse disease. He      has a proximal to mid subtotal obstruction of the long area with a high      branching vessel also noted. There is a distal sequential 70-80%      stenosis noted prior to the PDA. He has moderate left-to-right      collaterals noted filling the distal vessel.  7.  LV: EF is 55-60% with mild inferior hypokinesis. LVEDP was 10  mmHg.  8.  Limited right femoral angiogram shows moderate calcification throughout      the common femoral and iliac vessels with 50% stenosis noted in the      common femoral.   IMPRESSION:  1.  Single-vessel obstructive disease noted in the right coronary artery.  2.  Preserved left ventricular systolic function with an ejection fraction      of 55-60% and mild inferior hypokinesis.  3.  Peripheral vascular disease with a 50% stenosis in his right femoral      artery.   PLAN:  1.  Due to small size of the RCA and its diffuse disease, it is recommended      for aggressive medical management at this time. Intracoronary      nitroglycerin was given without any significant changes in size. The      size of the vessel is approximately 2 mm or less.  In the proximal      portion decreasing to less than  2 mm throughout the mid and distal      portions. He does have moderate collaterals filling the distal vessel.      If his symptoms became refractory, possible PTCA could be done.      Stenting would probably be difficult due to vessel size and length of      stent needed (high incidence of restenosis).  I did ask Dr. Mertie Moores to review the films, and he agrees that      stenting would be a high-risk procedure and likely would provide little      benefit at this time. Therefore, we will start Plavix and Imdur, in      addition to continuing his statin, beta blocker, and we did discuss      smoking cessation at length.      Kaylyn Lim., M.D.  Electronically Signed     TWK/MEDQ  D:  03/16/2005  T:  03/16/2005  Job:  [id number]

## 2010-10-09 NOTE — Discharge Summary (Signed)
[name], [name]                ACCOUNT NO.:  [id number]   MEDICAL RECORD NO.:  [id number]          PATIENT TYPE:  INP   LOCATION:  5736                         FACILITY:  [facility]   PHYSICIAN:  Corinna L. Conley Canal, MDDATE OF BIRTH:  [birth year]   DATE OF ADMISSION:  12/09/2005  DATE OF DISCHARGE:  12/13/2005                                 DISCHARGE SUMMARY   DISCHARGE DIAGNOSES:  1.  Weakness and falls  2.  Hyponatremia.  3.  Alcohol abuse.  4.  Nasal fracture and epistaxis requiring electrocautery x2 and nasal      packing.  5.  Severe hyponatremia.  6.  Anemia.  7.  Hypertension.  8.  Carotid artery disease.  9.  Bilateral carotid bilateral endarterectomy.  10. Peripheral vascular disease with stenting of the left common iliac      artery and external iliac artery.  11. Acute blood loss anemia.  12. Increased liver function tests most likely alcohol related.   DISCHARGE MEDICATIONS:  1.  Hold aspirin and Plavix until cleared by Dr. Constance Holster.  He has an      appointment with Dr. Constance Holster tomorrow for removal of nasal packing.  2.  He is to stop his lisinopril/hydrochlorothiazide combination tablet and      instead take lisinopril 20 mg a day.  3.  He is to stop phenobarbital.  4.  Stop Elavil.  5.  Continue labetalol 400 mg p.o. b.i.d.  6.  Folic acid 1 mg daily.  7.  Thiamine 100 mg daily.  8.  Continue Vytorin 5/10 mg daily.  9.  Tylenol or Vicodin as needed for pain.   DIET:  Diet should be regular with 1500 mL fluid restriction daily.   CONDITION ON DISCHARGE:  Stable.   ACTIVITY:  No driving or heavy exertion until cleared by primary care  physician.  He is getting outpatient physical therapy for balance training.   FOLLOW UP:  He is to follow up tomorrow with Dr. Constance Holster for removal of nasal  packing in the right nare.  He is to follow up with Dr. Bradd Burner next week to  check his sodium.   CONSULTATIONS:  Dr. Constance Holster.   PROCEDURES:  Cautery with silver nitrate to the  left nare and nasal packing  of the right nare.   LABORATORY DATA AND X-RAY FINDINGS:  On admission his sodium was 113.  At  discharge, sodium is 130.  His chloride was 82.  The rest of his BMET was  unremarkable.  His AST was 69, ALT of 45, otherwise LFTs unremarkable.  Serum osmolarity 256.  Urine osmolarity 436.  Initial hemoglobin 9.2 dropped  to a low of 8.3 and was transfused 2 units of packed red blood cells to a  hemoglobin of 10.5.  At discharge, his hemoglobin was stable at 10.3, MCV is  112, white count and platelet count normal.  Coagulation panel normal.  Folate greater than 20.  TSH 0.739.  Cortisol 7.9.  Phenobarbital level 26.  Urine drug screen positive for barbiturates only RPR nonreactive.   EKG showed normal sinus rhythm.  EEG was normal.  CT of the brain showed  nothing intracranially acute, but mild cerebral atrophy.  No evidence of C-  spine fracture.  Maxillofacial CT showed a right-sided nasal bone fracture  and fracture of the nasal septum.  Right maxillary sinus air fluid level.  Chest x-ray one view was low inspiratory and COPD.   HISTORY OF PRESENT ILLNESS:  [name] is a pleasant, 60 year old, white  male patient of Dr. Bradd Burner who was brought to the emergency room after  having fallen and hit his face on the concrete.  He had been falling a lot  recently.  He initially refused to come to the hospital, but after getting  wedged between the wall, and I believe the toilet, 9-1-1 was called and he  presented to the emergency room.  He was found to have nasal fractures and  epistaxis.  Dr. Constance Holster consulted for the epistaxis.  He was found to be  hyponatremic.  Please see Dr. Tanna Furry H&P for admission details.   HOSPITAL COURSE:  He was placed in the step-down unit and it was felt that  he was mildly volume depleted.  He was started on normal saline.  He had  been on hydrochlorothiazide as an outpatient and according to the wife, was  drinking two beers per  day and a lot of liquids.  His sodium improved on the  saline.  He was subsequently placed on fluid restriction and the saline was  stopped.  His sodium has continued to increase during his hospitalization to  near normal levels now.  I suspect his hyponatremia is a combination of  medications and polydipsia with a possible component of beer potomania.  Apparently, he used to drink upwards of a 12-pack of beer per day, but  reports that he has cut back recently.  I recommend not resuming the  hydrochlorothiazide.  For now, I keep him on fluid restriction, however, he  may be able to come off fluid restriction once his sodium stabilizes, as he  is no longer on the hydrochlorothiazide.  His falls, I suspect, are  multifactorial.  I suspect he has component of chronic ataxia.  Apparently,  he was quite a heavy drinker and he may have a component of peripheral  neuropathy from alcohol.  Certainly, he could be oversedated from having  been on Elavil and phenobarbital as well and I have stopped this.  Of  course, his hyponatremia was a major contributor as well.  He worked with  physical therapy today and did quite well.  He does have balance problems,  however, and he will get outpatient physical therapy.   The patient received transfusion of packed red blood cells for his acute  blood loss anemia from severe epistaxis and he has not had any bleeding for  several days.  He has been on Keflex while on the nasal packing and has a  followup appointment with Dr. Constance Holster tomorrow.  His Plavix has been held for  several days and I have recommend resuming and it tomorrow if cleared by Dr.  Constance Holster.   His blood pressure initially was elevated, but his blood pressure  medications had been held.  His blood pressure is better now on the  labetalol and lisinopril.   He was started on Ativan protocol for detox.  He had no signs of withdrawal and I did recommend that he abstain from alcohol completely.    Total time on the day of discharge is 45 minutes.  Corinna L. Conley Canal, MD  Electronically Signed     CLS/MEDQ  D:  12/13/2005  T:  12/13/2005  Job:  [id number]   cc:   Janifer Adie, M.D.  Kaylyn Lim., M.D.  Jefry H. Constance Holster, MD

## 2010-10-09 NOTE — Discharge Summary (Signed)
NAMEAIZEN, MALO                ACCOUNT NO.:  [id number]   MEDICAL RECORD NO.:  [id number]          PATIENT TYPE:  INP   LOCATION:  [id number]                         FACILITY:  [facility]   PHYSICIAN:  Corinna L. Conley Canal, MDDATE OF BIRTH:  [birth year]   DATE OF ADMISSION:  03/14/2005  DATE OF DISCHARGE:  03/17/2005                                 DISCHARGE SUMMARY   DISCHARGE DIAGNOSES:  1.  Atypical chest pain.  The patient was admitted to telemetry.  He      underwent serial cardiac enzymes which showed no elevations in the      cardiac markers.  He, therefore, underwent Cardiolite stress testing      which showed a fixed defect on both stress and resting images with      slightly pronounced defect along the margins.  A small amount of peri-      infarct ischemia could not be excluded.  There were no other perfusion      abnormalities.  His ejection fraction was noted to be 59%.  There was      inferior wall hypokinesis.  The patient, therefore, was taken to cardiac      catheterization for further evaluation. He was found to have left-sided      patent vasculature but his right side had patchy diffuse disease and the      system itself was small in size.  The decision after catheterization was      to treat him medically at this time because there was no      interventionable lesion.  The patient was to follow up with Dr. Verlon Setting      in the next two weeks.  The patient's medical therapy will consist of      aspirin, Plavix, and the addition of Imdur.  2.  Hypertension.  The patient has been on lisinopril, hydrochlorothiazide,      labetalol, and verapamil.  His blood pressure was well controlled.      However, his heart rate was marginal in the low 50's.  Therefore, his      verapamil was held.  The patient was instructed to please call Dr.      Janyth Pupa office on March 18, 2005 to discuss the resumption of his      verapamil, whether it be at current dose of 240 mg twice daily or the      decreased dose.  I have discussed this with Dr. Verlon Setting staff and their      instruction was to call October 26 and continue to hold the verapamil      until that time.  3.  Hypercholesterolemia.  The patient will continue with his Vytorin one      tablet at bedtime.  4.  Severe peripheral vascular disease.  The patient had complained of      claudication.  We, therefore, performed arterial studies on his lower      extremities which found the right side to have some normal wave forms.      The left side had decreased wave forms but not  severely.  We had      obtained carotid ultrasounds because of bilateral bruits and found that      the patient had 80% stenosis of the internal carotid artery on both      sides.  I discussed the case with Dr. Drucie Opitz.  He will see the      patient as an outpatient to decide on further management since the      patient is technically asymptomatic with regard to his stenosis.  5.  Cluster migraines.  I have reviewed the case with the care practitioner      at Dr. Mayra Reel office and informed him of our findings.  We concurred      with each other that the patient should no longer be using Imitrex for      his headaches because of his severe peripheral vascular disease and the      risk for stroke related to use of that drug.  The headache center agrees      to see Mr. Yoho as well as possible should his cluster migraines      begin to cycle again on the current changes with his medication which      may exacerbate his disease.  As instructed, the patient is not to resume      his Imitrex and to follow up with Dr. Earley Favor sooner if needed.   DISCHARGE MEDICATIONS:  1.  Lisinopril 20 mg daily.  2.  Hydrochlorothiazide 25 mg daily.  3.  The patient can resume the combination pill for these drugs.  4.  Labetalol 200 mg twice a day.  5.  Vytorin one tablet at bedtime.  I believe the mg dosage is 10/20.  6.  Verapamil 240 mg twice a day will be held  until  he speaks with Dr.      Verlon Setting.  7.  Aspirin 325 mg daily.  8.  Plavix 75 mg daily.  9.  Imdur 30 mg daily.  10. The patient has been instructed not to use his Imitrex.   FOLLOWUP:  The patient is the return to Dr. Angelena Sole in two weeks and  call him on October 26 to discuss his verapamil.  He is to call Dr. Drucie Opitz at [phone] to set up an appointment regarding his bilateral carotid  artery disease.   HISTORY OF PRESENT ILLNESS:  The patient is a 60 year old white male with  past medical history for hypertension, cluster headaches,  hypercholesterolemia and likely peripheral vascular disease.  The patient  presented to the hospital on October 22 with the complaint of periodic chest  pain over the past couple of days prior to admission.  He states it was  described as pressure, 3-5/10, lasting a few seconds and then continued to  last longer and longer.  The patient's had significant risk factors for  smoking two to three packs a day of cigarettes.  He had known hypertension  and hyperlipidemia.  The patient had no associated symptoms with his chest  pressure. No palpitations, diaphoresis or pain down his arms.  The patient  was, therefore, admitted to a telemetry bed.  He was ruled out with serial  cardiac enzymes which were all negative.  He underwent cardiac stress  testing which showed a fixed defect as well as an area that questionably  could have represented ischemia.  The patient, therefore, had cardiology  consult and went on to cardiac catheterization which was not complicated.  As stated above, the patient has patent left coronary artery system.  However, his right side is small and has patchy diffuse disease that was not  amenable to stenting.  For medical management, the patient was started on  Imdur, Plavix and aspirin which he will continue and will follow up with Dr.  Verlon Setting as an outpatient.  Please note the patient had a slightly elevated D- dimer.   This, however, was not investigated in terms of a CT scan of the  chest as his coronary artery symptoms appeared to be the source for his  discomfort.   Investigating the patient's complaint of claudication and carotid bruits, we  discovered that he had bilateral 80% stenosis of his internal carotid  arteries.  Arrangements will be made as an outpatient to follow up with [facility]  to discuss possible interventions for this.  I discussed with the patient  the life-threatening nature of the use of Imitrex with this type of  peripheral vascular disease and this life-threatening use of cigarettes.  The patient is going to attempt to do smoking cessation with Nicoderm  patches and he has been cleared by the headache center to start the higher  dose Nicoderm patch which he would do as an outpatient.   On the day of discharge, the patient was hemodynamically stable.   PHYSICAL EXAMINATION:  VITAL SIGNS:  His temperature was 97.5, blood  pressure 115/63, pulse 72, respirations 20, saturation 96%.  GENERAL:  Well-developed, well-nourished white male in no acute distress.  HEENT: Normocephalic, atraumatic.  Pupils are equal, round and reactive to  light.  Extraocular muscles were intact.  Mucous membranes are moist.  NECK:  Supple. There is no JVD.  No lymphadenopathy.  No carotid bruits.  CHEST:  Clear to auscultation.  No rhonchi, rubs or wheezes.  CARDIOVASCULAR:  Regular rate and rhythm.  Positive S1 and S2.  No S3 or S4.  No murmurs, rubs or gallops.  ABDOMEN:  Soft, nontender, nondistended with positive bowel sounds.  EXTREMITIES:  No clubbing, cyanosis or edema. His wound was clean, dry and  intact with no bruits and no bleeding from his catheterization site.  NEUROLOGIC:  Nonfocal.   LABORATORY DATA:  His discharging white cell count was 8.7, hemoglobin 13.3,  hematocrit 38.5, platelets 229.  Lipid profile reveals a cholesterol total  of 144, triglycerides 99, HDL 49, and LDL 75.  As of  this time his cardiac  enzymes were negative.  His discharging BUN was 8 with a creatinine of 1.   At this time the patient seemed stable for discharge to follow up with Dr.  Verlon Setting and Dr. Earley Favor.  He also can follow up with Dr. Barney Drain.      Melissa L. Lovena Le, MD      Pettus Conley Canal, MD  Electronically Signed    MLT/MEDQ  D:  03/18/2005  T:  03/18/2005  Job:  [id number]   cc:   Janifer Adie, M.D.  Fax: [place]. Earley Favor, M.D.  Fax: [phone]   Kaylyn Lim., M.D.  Fax: [phone]   P. Drucie Opitz, M.D.  [address]  [place]  [place] [address]

## 2010-10-09 NOTE — Discharge Summary (Signed)
NAMEPEARLIE, YOSHIKAWA NO.:  [id number]   MEDICAL RECORD NO.:  [id number]          PATIENT TYPE:  INP   LOCATION:  [id number]                         FACILITY:  [facility]   PHYSICIAN:  Kaylyn Lim., M.D.DATE OF BIRTH:  05-11-1951   DATE OF ADMISSION:  01/16/2006  DATE OF DISCHARGE:  01/17/2006                                 DISCHARGE SUMMARY   DISCHARGE DIAGNOSES:  1. Chest pain.  2. History of obstructive single vessel coronary artery disease.  3. Hypertension.  4. Peripheral vascular disease.  5. History of cluster headaches.  6. History of hyponatremia.   HISTORY OF PRESENT ILLNESS:  The patient is a very pleasant 60 year old  white male who presented after an episode of chest pain.  The patient  reported he had been doing his normal activities. However, over the  afternoon started having substernal chest pain. He had no symptoms prior to  this. His chest pain lasted approximately an hour before subsiding. He was  brought to the emergency room for further evaluation.   HOSPITAL COURSE:  The patient was admitted for observation. He remained  chest pain free throughout his hospitalization.  He continued with his  medications and had serial cardiac enzymes all performed which were  negative. His ECG on day of discharge was normal. The patient has now been  up and ambulatory without any further symptoms and will be discharged home  later today.   DISCHARGE MEDICATIONS:  1. Vytorin 10/20 mg daily.  2. Labetalol 200 mg b.i.d.  3. Lisinopril 20 mg daily.  4. Aspirin 81 mg daily.  5. Plavix 75 mg daily.  6. Folic acid once daily.  7. Multivitamin one daily.  8. B1 vitamin once daily.  9. Actiq lozenges 400 mg p.r.n..  10.Newly added Ranexa 500 mg twice daily (the patient to come to my office      for samples).   We discussed the patient is okay for outpatient workup, and will have a  stress Cardiolite scheduled later this week. He is to call the office  should  he have any worsening of symptoms. We did choose Ranexa because he is unable  to tolerate long-acting nitrates because of his cluster headaches. His  follow-up appointment will be with Dr. Angelena Sole at [facility]  as described above.  He is to notify the office if he has any questions.      Kaylyn Lim., M.D.  Electronically Signed     TWK/MEDQ  D:  01/17/2006  T:  01/18/2006  Job:  [id number]

## 2010-10-09 NOTE — H&P (Signed)
NAMEDOMONICK, FILTER NO.:  [id number]   MEDICAL RECORD NO.:  [id number]                   PATIENT TYPE:  EMS   LOCATION:  ED                                   FACILITY:  [facility]   PHYSICIAN:  Wenda Low, M.D.                 DATE OF BIRTH:  11/05/1950   DATE OF ADMISSION:  02/22/2003  DATE OF DISCHARGE:                                HISTORY & PHYSICAL   PRIMARY CARE PHYSICIAN:  Janifer Adie, M.D.   CHIEF COMPLAINT:  Right-sided abdominal pain and rectal bleeding.   HISTORY OF PRESENT ILLNESS:  A 60 year old male with a history of  hypertension, had two days of lower abdominal pain mostly on the right side,  had poor appetite for one day, saw Danielle L. Mahaffey, M.D. who is in the  clinic today, because of the pain was sent to the emergency room for  possible appendicitis. The patient came to the ER and had a CT scan of the  abdomen and pelvis done which showed a right sided colitis, no acute  appendicitis, no abscess. The patient reported that he had rectal bleeding  today and a little bit yesterday, fresh rectal blood and then today is a  little more dark stools x1. No nausea, vomiting. A little bit of loose  stools. No history of chest pain or shortness of breath. No prior history of  abdominal symptoms or colon issues, no urinary symptoms. He denies any  nonsteroidal use, no alcohol, no cough or fevers.   ALLERGIES:  None.   MEDICATIONS:  1. Verapamil 240 mg daily.  2. Lipitor 10 mg daily.   PAST MEDICAL HISTORY:  1. Hypertension.  2. Cluster headaches.  3. Hyperlipidemia.   PAST SURGICAL HISTORY:  1. Hernia repair as a child.  2. Right knee surgery laparoscopic.   SOCIAL HISTORY:  Tobacco about two packs a day. Alcohol occasional beer.   FAMILY HISTORY:  Hypertension.   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   REVIEW OF SYMPTOMS:  As above.   PHYSICAL EXAMINATION:  VITAL SIGNS:   Temperature 97.9, blood pressure  155/92, pulse 77, sat 98%.  HEENT:  Pupils reactive. Extraocular movements intact.  NECK:  Supple.  GENERAL:  A well-appearing male in no acute distress.  CARDIAC:  S1 and S2, no murmur.  LUNGS:  Clear.  ABDOMEN:  Soft, positive bowel sounds, some tenderness in the lower abdomen  area. No rebound or guarding.  EXTREMITIES:  No edema.  RECTAL:  Per ER doctor was guaiac positive, black stool, no melena.  NEUROLOGIC:  Nonfocal.   CT scan of the abdomen and pelvis which showed right sided colonic  thickening consistent with colitis on the right side, no abscess and a 3 cm  abdominal aortic aneurysm otherwise negative.   UA was negative. White count 12.9, hemoglobin 15.9. Chemistries, sodium  136,  potassium 4.2, BUN of 9, creatinine 0.9, glucose 99, LFTs normal.   IMPRESSION:  A 60 year old male with hypertension, two days abdominal pain  and some rectal bleeding. A CT scan showing some thickening of the right  sided colon.  1. Right sided colitis. Differential includes infection, diverticulosis     ischemic or inflammatory. Plan is  IV fluids, IV Cipro and Flagyl, clear     liquids. GI consult Ronald Lobo, M.D. for possible colonoscopy.  2. Hypertension.  Continue Verapamil.  3. Tobacco use. I will put him on nicotine patch.                                               Wenda Low, M.D.    Veatrice Bourbon  D:  02/23/2003  T:  02/23/2003  Job:  [id number]

## 2010-10-09 NOTE — H&P (Signed)
NAMEATHONY, [name]                ACCOUNT NO.:  [id number]   MEDICAL RECORD NO.:  [id number]          PATIENT TYPE:  INP   LOCATION:  1823                         FACILITY:  [facility]   PHYSICIAN:  Melissa L. Lovena Le, MD  DATE OF BIRTH:  1951-01-12   DATE OF ADMISSION:  12/09/2005  DATE OF DISCHARGE:                                HISTORY & PHYSICAL   CHIEF COMPLAINT:  Falling and nose bleeding.   Primary care physician is Dr. Barney Drain.   HISTORY OF PRESENT ILLNESS:  The patient is a 60 year old white male with a  past medical history for severe peripheral vascular disease involving his  lower extremities as well as carotid arteries and coronary arteries.  The  patient recently underwent bilateral carotid artery surgeries back in  November and January and then subsequently this May had left leg surgery to  correct claudication.  Postoperatively, the patient was noted to be  tremulous in the PACU area.  Was seen and evaluated by Neurology.  It was  determined that this tremor may be secondary to alcohol withdrawal.  He was  started on phenobarbital and since that time has had difficulty with  walking, falling, fatigue and general weakness.  Today, the patient was  noted to have fallen three times.  He initially refused to come to the  emergency room but on the last fall he landed wedged between the toilet and  the bathtub, and his wife called EMS for him to come in.  Of note, the  patient does have a significant ethanol history.  Several months ago he was  drinking all day long.  His wife relates that Dr. Bradd Burner had a severe talk  with him and since that time he has cut back.  This is as per his wife;  however, the patient is not clearly answering whether or not he has truly  had to cut back.  Today, the patient had two beers and did not drink all of  them.  The patient's wife gives most of the history and states that today  they were out at the pool when he somewhat fell striking  his nose on the  concrete as well as his face sustaining multiple abrasions.  The patient  refused to come to the emergency room and instead went home.  He was able to  pack his nose with a tampon with good results.  He fell again and the  finally the patient's wife found him, as stated, in the bathroom.  The  patient states that he does not really know that he is going to fall.  He  said the symptoms come on quite quickly, and he really does not recall the  event after the fact, but there is no report of loss of bowel or bladder  continence.  His wife has witnessed these events and does state that he  appears to be semi-conscious, and his eyes roll back in his head.   REVIEW OF SYSTEMS:  Fainting spells as stated with potential for possible  seizure activity, diarrhea since May about three times a day.  He denies any  blood in that.  His wife of note was a sick contact.  They went to [facility], and she returned with diarrhea.  Subsequently, he developed diarrhea.  She was treated for a infectious process, but he never went to the  doctors.  The patient had been having nosebleeds, and therefore he changed  from daily Plavix to an every-other-day Plavix.  The patient denies melena  or hematochezia.  He has had weight gain since his diet.  As stated, he had  been having diarrhea, although review of systems other than the fact that he  walks like a 60 year old since his surgery in May.   PAST MEDICAL HISTORY:  Carotid artery disease status post endarterectomy  bilaterally, peripheral vascular disease, status post balloon angioplasty in  the lower extremities, hypertension.  He denies diabetes.  He does have  hyperlipidemia.  He used to get cluster headaches, but the last one has been  since December.  He sees Dr. Verlon Setting for his cardiac condition.  The patient  has chronic hyponatremia which I suspect is secondary to ethanol abuse.  The  patient also had a history of ischemic colitis.    PAST SURGICAL HISTORY:  Carotid endarterectomy.  The right was completed in  January of 2007.  The left was completed back in November of 2006.  A left  leg balloon angioplasty with stenting in May of 2007.   SOCIAL HISTORY:  He quit tobacco October 2nd of last year.  He is still  drinking at least two beers a day which is down significantly from his  previous all-day drinking.   FAMILY HISTORY:  Mom is living with carotid disease, hypertension and  Alzheimer's.  Dad is living with a history of strokes.   ALLERGIES:  No known drug allergies.   MEDICATIONS:  Aspirin 81 mg, lisinopril/hydrochlorothiazide 20/25 mg q.d.,  labetalol 200 two tablets b.i.d., Plavix 75 mg every other day.  His last  dose is unknown.  Phenobarbital 64.8 mg 1 p.o. q.h.s., amitriptyline 25 mg  q.h.s., Vytorin 10/25 1/2 tablet q.d.   PHYSICAL EXAMINATION:  VITAL SIGNS:  Temperature is 96.9, blood pressure  106/60, pulse 72, respirations 25.  Saturation of 98%.  GENERAL:  This is a cognitively slow white male in no acute distress.  HEENT:  He is normocephalic with multiple areas of abrasion on the face.  Positive epistaxis, right greater than left.  Mucous membranes are moist.  I  did remove a large clot from his right naris.  He will occasionally continue  to have a trickle from right and left side of his nose.  He has bilateral  carotid scars which are well healed.  He has mild left eye ptosis related to  nerve trauma.  There is no thyromegaly.  No carotid bruits are noted.  CHEST:  Decreased.  No rhonchi, rales or wheezes are noted.  CARDIOVASCULAR:  Regular rate and rhythm.  Positive S1, S2.  No S3, S4.  No  murmurs, rubs or gallops.  Heart sounds are distant.  ABDOMEN:  Soft, nontender and nondistended with positive bowel sounds.  EXTREMITIES:  No clubbing, cyanosis or edema, but he does have multiple  abrasions including the right palm and fingers of both hands. NEURO:  He has mild dysmetria of both upper  extremities.  He is having  difficulty ambulating.  At this time he has mild pronator drift, left  greater than right.  No nystagmus noted.  The patient  is unable to give  accurate decisions related to his peripheral fields.  Deep tendon reflexes  are decreased at 1.  He has no plantar reflex.  He is alert and oriented but  slowly.   LABORATORY:  Sodium is 113, potassium 3.7, chloride 82, CO2 18, BUN 17,  creatinine 0.8, glucose 84.  His white count is 5.4, hemoglobin 8.7,  hematocrit 25.7, platelets 184.  His phenobarbital level is 26 which is  within normal limits.   EKG shows a heart rate of 73 beats/min with minor Q's in II, III and aVF.   AST is elevated at 69.  ALT is elevated at 45.  Albumin is 5.5.   CT scan of the head:  No acute intracranial findings.  His C-spine was  reviewed and shows no acute findings.  He does have severe degenerative disk  disease and spinal canal stenosis.  He does have a right nasal bone fracture  and nasal septum fracture.  He does have right maxillary blood but no other  facial or orbital fractures are identified.   ASSESSMENT/PLAN:  This is a 60 year old white male with an extensive past  medical history for peripheral vascular disease status post bilateral  endarterectomies, significant ethanol history which has decreased over the  past couple of months to two to three beers a day, tremor which is likely  withdrawal related, and repeat fainting spells over the last couple of weeks  to months with associated gait disturbance.  The patient has had difficulty  walking as stated and today ended up with three episodes of fainting with  witnessed eye rolling and essential decreased level of consciousness.  The  patient also has been suffering from diarrhea since May.  His wife states  that she had an infectious process that was treated with similar symptoms.  Reason for admission tonight is three separate falls today with trauma to  the nose and face  resulting in epistaxis.  The patient initially refused to  come to the emergency room but when he was found down with his head wedged  between the toilet and the bathroom his wife called EMS.   1.  Severe chronic hyponatremia.  Unclear whether the seizures are present      and may be related to his hyponatremia or if they are present but not      related.  At this time we will gently replete his sodium and if he has      another seizure I will go ahead with the 3% saline solution for more      aggressive care, however, at this time the patient has known chronic      hyponatremia, and I would want to replete his sodium slowly.  The      contributing factor for this hyponatremia likely is ethanol abuse, but I      cannot rule out adrenal insufficiency or thyroid disease, so at this      time we will check a random cortisol and a TSH.  We will fluid restrict      him.  He is slightly hypovolemic, so I will use normal saline for volume     repletion at this time and will check his sodium every two hours to      monitor the repletion rate.  2.  Cardiovascular with a history of right coronary artery disease with an      ejection fraction of 55-60%.  We will try to continue his Plavix and  aspirin if his nose will cooperate.  We will monitor him on heart      monitor.  3.  Pulmonary.  No complaints.  X-ray of 02/12 showed COPD.  I will go ahead      and repeat a chest x-ray to make sure that he has not aspirated.  4.  Neurological.  Question of likely seizures of unclear etiology.  May be      alcohol related, benzodiazepine related or sodium related.  MRI was      completed back on July 12 which showed only microvascular disease and CT      scan tonight shows no acute disease other than his new fractures to his      nose.  We will check an EEG and consider neurological consult in the      a.m.  5.  Epistaxis.  We will do p.r.n. packing.  If his bleeding does not stop I      would recommend  an ENT consult in the a.m. since he does have a nasal      fracture.  6.  Possible ethanol withdrawal.  I would like to hold his phenobarbital and      start him on Ativan protocol with thiamine, folate and a multivitamin.  7.  Macrocytic anemia.  I will check a B-12, folate and consider transfusing      him based on his next H&H.  8.  Peripheral vascular disease.  We will also try to continue his aspirin      and Plavix at this time it will be held secondary to the epistaxis.  9.  Insomnia.  Amitriptyline is not working well.  I will discontinue that      and maybe recommend that he have an evaluation for this at a later time.      The Ativan protocol should help him sleep.  10 ETOH abuse.  I will start an ativan protocol.      Melissa L. Lovena Le, MD  Electronically Signed     MLT/MEDQ  D:  12/09/2005  T:  12/09/2005  Job:  [id number]   cc:   Janifer Adie, M.D.  Fax: [phone]

## 2010-10-09 NOTE — Op Note (Signed)
[name], [name]                            ACCOUNT NO.:  [id number]   MEDICAL RECORD NO.:  [id number]                   PATIENT TYPE:  INP   LOCATION:  [id number]                                 FACILITY:  [facility]   PHYSICIAN:  Ronald Lobo, M.D.                DATE OF BIRTH:  [birth year]   DATE OF PROCEDURE:  02/24/2003  DATE OF DISCHARGE:  02/24/2003                                 OPERATIVE REPORT   PROCEDURE:  Colonoscopy with biopsies.   ENDOSCOPIST:  Cleotis Nipper, M.D.   INDICATIONS:  This is a 60 year old gentleman who presented with right lower  quadrant pain and leukocytosis suggestive of appendicitis.  The CT scan  showed a normal appendix and rather inflammatory changes involving the  cecum.  He has improved in the hospital over the past day.  It is noteworthy  that he had bloody diarrhea in association with the pain.   FINDINGS:  Ischemic colitis involving the cecum.  Several diminutive polyps.   INFORMED CONSENT:  The nature, purpose and risks of the procedure had been  discussed with the patient who provided written consent.   SEDATION:  Fentanyl 62.5 mcg, and Versed 10 mg IV prior to and during the  course of the procedure without arrhythmias or desaturation.   DESCRIPTION OF PROCEDURE:  Digital exam of the prostate gland was normal.   The Olympus adjustable tension pediatric video colonoscope was advanced  easily around the colon to the cecum and for a short distance into a normal  appearing terminal ileum.  Pullback was then performed.  The quality of the  prep was excellent, and it was felt that all areas were well seen.  There  was no blood in the colonic lumen at the time of his exam.   The main finding on this exam was the presence of significantly moderately  severe inflammatory change involving approximately 1/3 to 1/2 of the cecal  surface area.  It did not really seem to extend significantly into the  ascending colon.  Also, it did not appear to  involve the terminal ileum.  It  was characterized by erythema, exudate, and mucosal edema in a pattern very  suggestive of ischemic colitis.  Several biopsies of this area were  obtained.  There was no frank gangrene.   In the proximal ascending colon and also in the splenic flexure and finally  in the rectosigmoid region, I encountered many (approximately eight) small  sessile polyps which appeared hyperplastic.  The largest of these was  perhaps 4 mm across.  They were removed by cold biopsy technique.  They were  so numerous in the rectosigmoid area that not every last one was biopsied,  although it is felt that an adequate sampling was obtained.   No large polyps, cancer, or diverticulosis were observed.  I did not see any  vascular malformations.   Retroflexion in the  rectum and reinspection in the rectosigmoid disclosed no  additional findings.   The patient tolerated the procedure well and there were no apparent  complications.   IMPRESSION:  1. Probable ischemic colitis in the cecum, apparently resolving.  Mild to     moderate severity at the present time.  2. Multiple small sessile polyps identified and most of them were removed by     cold biopsy technique.  3. No bleeding at the time of this exam.   RECOMMENDATIONS:  1. Await pathology and biopsies.  2. It is okay to advance the patient's diet and discharge him home if the     diet is tolerated.  3. No clear indication for further antibiotic usage.                                               Ronald Lobo, M.D.    RB/MEDQ  D:  02/24/2003  T:  02/24/2003  Job:  [id number]

## 2010-11-12 ENCOUNTER — Other Ambulatory Visit: Payer: Self-pay | Admitting: *Deleted

## 2010-11-12 MED ORDER — AMLODIPINE BESYLATE 5 MG PO TABS: 5.0000 mg | ORAL_TABLET | Freq: Every day | ORAL | Status: DC

## 2010-11-12 NOTE — Telephone Encounter (Signed)
Fax received from pharmacy. Refill completed. Jodette Eylin Pontarelli RN  

## 2010-12-14 ENCOUNTER — Encounter: Payer: Self-pay | Admitting: Cardiovascular Disease

## 2010-12-15 ENCOUNTER — Encounter: Payer: Self-pay | Admitting: Cardiovascular Disease

## 2010-12-15 ENCOUNTER — Ambulatory Visit (INDEPENDENT_AMBULATORY_CARE_PROVIDER_SITE_OTHER): Payer: Managed Care, Other (non HMO) | Admitting: Cardiovascular Disease

## 2010-12-15 DIAGNOSIS — I10 Essential (primary) hypertension: Secondary | ICD-10-CM

## 2010-12-15 DIAGNOSIS — I2510 Atherosclerotic heart disease of native coronary artery without angina pectoris: Secondary | ICD-10-CM

## 2010-12-15 DIAGNOSIS — I739 Peripheral vascular disease, unspecified: Secondary | ICD-10-CM

## 2010-12-15 DIAGNOSIS — E7800 Pure hypercholesterolemia, unspecified: Secondary | ICD-10-CM

## 2010-12-15 MED ORDER — AMLODIPINE BESYLATE 2.5 MG PO TABS: 2.5000 mg | ORAL_TABLET | Freq: Every day | ORAL | Status: DC

## 2010-12-15 MED ORDER — NITROGLYCERIN 0.4 MG SL SUBL: 0.4000 mg | SUBLINGUAL_TABLET | SUBLINGUAL | Status: DC | PRN

## 2010-12-15 NOTE — Patient Instructions (Signed)
Your physician wants you to follow-up in: 6 months with Dr. Burt Knack. You will receive a reminder letter in the mail two months in advance. If you don't receive a letter, please call our office to schedule the follow-up appointment.  Your physician has recommended you make the following change in your medication: Decrease amlodipine to 2.5 mg by mouth daily.  Your physician recommends that you return for fasting lab work in: January 2013. Lab work to be done about a week or so prior to appointment with Dr. Burt Knack.  Lipid, Liver, BMP, CBC

## 2010-12-16 ENCOUNTER — Other Ambulatory Visit: Payer: Self-pay | Admitting: Cardiovascular Disease

## 2010-12-17 ENCOUNTER — Other Ambulatory Visit: Payer: Self-pay | Admitting: *Deleted

## 2010-12-17 DIAGNOSIS — I10 Essential (primary) hypertension: Secondary | ICD-10-CM

## 2010-12-17 MED ORDER — LISINOPRIL 20 MG PO TABS: 20.0000 mg | ORAL_TABLET | Freq: Two times a day (BID) | ORAL | Status: DC

## 2010-12-17 NOTE — Telephone Encounter (Signed)
Ward pt. 

## 2011-01-03 ENCOUNTER — Encounter: Payer: Self-pay | Admitting: Cardiovascular Disease

## 2011-01-03 DIAGNOSIS — I10 Essential (primary) hypertension: Secondary | ICD-10-CM | POA: Insufficient documentation

## 2011-01-03 DIAGNOSIS — I25110 Atherosclerotic heart disease of native coronary artery with unstable angina pectoris: Secondary | ICD-10-CM | POA: Insufficient documentation

## 2011-01-03 DIAGNOSIS — E785 Hyperlipidemia, unspecified: Secondary | ICD-10-CM | POA: Insufficient documentation

## 2011-01-03 DIAGNOSIS — I739 Peripheral vascular disease, unspecified: Secondary | ICD-10-CM | POA: Insufficient documentation

## 2011-01-03 NOTE — Assessment & Plan Note (Signed)
Carotid artery stenosis and lower extremity PAD are followed by Dr. Bridgett Larsson with vascular surgery.

## 2011-01-03 NOTE — Assessment & Plan Note (Signed)
The patient is stable without angina. He is on a good medical regimen with aspirin, Plavix, lisinopril, carvedilol, and a statin drug. His previous office notes of all been reviewed and he has been stable for some time. I would like to see him back in followup in 6 months

## 2011-01-03 NOTE — Assessment & Plan Note (Signed)
Lipids will be checked prior to his next office visit. The patient is on statin therapy.

## 2011-01-03 NOTE — Assessment & Plan Note (Signed)
Blood pressure is well controlled. He would like to decrease amlodipine to 2.5 mg daily and we will do this since his blood pressure is in the low-normal range.

## 2011-01-03 NOTE — Progress Notes (Signed)
HPI:  [name] is a 60 year old gentleman presented for followup evaluation. He has been followed by Dr. Doreatha Best and I will be assuming his care in Dr. Doreatha Best retirement. The patient has diffuse vascular disease and coronary disease. He has undergone bilateral carotid endarterectomy and endovascular treatment of iliac stenosis. The patient also has coronary disease with total occlusion of his right coronary artery and mild to moderate LAD stenosis he has been managed medically his last nuclear stress test was in 2000 and demonstrated an ejection fraction of 61% with a small area of ischemia in his inferior wall.  The patient is doing well from a symptomatic standpoint. He complains of mild leg swelling and attributes this to amlodipine. He has no chest pain or pressure. He denies dyspnea, orthopnea, or PND.  Outpatient Encounter Prescriptions as of 12/15/2010  Medication Sig Dispense Refill  . amLODipine (NORVASC) 2.5 MG tablet Take 1 tablet (2.5 mg total) by mouth daily.  30 tablet  11  . aspirin 81 MG tablet Take 81 mg by mouth daily.        . carvedilol (COREG) 25 MG tablet Take 25 mg by mouth. Take 1 1/2 twice a day       . clopidogrel (PLAVIX) 75 MG tablet Take 75 mg by mouth daily.        . CRESTOR 20 MG tablet take 1 tablet by mouth once daily  30 tablet  PRN  . folic acid (FOLVITE) 1 MG tablet Take 1 mg by mouth daily.        . nitroGLYCERIN (NITROSTAT) 0.4 MG SL tablet Place 1 tablet (0.4 mg total) under the tongue every 5 (five) minutes as needed.  25 tablet  6  . ranitidine (ZANTAC) 150 MG tablet Take 2 tablets (300 mg total) by mouth 1 dose over 24 hours.  30 tablet  11  . Thiamine HCl (VITAMIN B-1) 100 MG tablet Take 100 mg by mouth daily.        Marland Kitchen DISCONTD: amLODipine (NORVASC) 5 MG tablet Take 1 tablet (5 mg total) by mouth daily.  30 tablet  5  . DISCONTD: lisinopril (PRINIVIL,ZESTRIL) 20 MG tablet Take 20 mg by mouth 2 (two) times daily.        Marland Kitchen DISCONTD: nitroGLYCERIN  (NITROSTAT) 0.4 MG SL tablet Place 0.4 mg under the tongue every 5 (five) minutes as needed.          No Known Allergies  Past Medical History  Diagnosis Date  . Hypertension   . Hyperlipidemia   . Coronary artery disease   . AAA (abdominal aortic aneurysm)   . PAD (peripheral artery disease)   . COPD (chronic obstructive pulmonary disease)   . Raynaud's disease /phenomenon   . Lumbar degenerative disc disease     ROS: Negative except as per HPI  BP 117/72  Pulse 72  Ht 5\' 10"  (1.778 m)  Wt 214 lb (97.07 kg)  BMI 30.71 kg/m2  PHYSICAL EXAM: Pt is alert and oriented, age-appropriate male in NAD HEENT: normal Neck: JVP - normal, carotids 2+= with bilateral bruits Lungs: CTA bilaterally CV: RRR without murmur or gallop Abd: soft, NT, Positive BS, no hepatomegaly Ext: no C/C/E, distal pulses intact and equal Skin: warm/dry no rash  EKG:  Normal sinus rhythm 72 beats per minute, within normal limits.  ASSESSMENT AND PLAN:

## 2011-03-08 LAB — CBC
Hemoglobin: 13.2
MCHC: 34.8
MCV: 103 — ABNORMAL HIGH
RBC: 3.69 — ABNORMAL LOW

## 2011-03-08 LAB — URINALYSIS, ROUTINE W REFLEX MICROSCOPIC
Ketones, ur: NEGATIVE
Nitrite: NEGATIVE
Specific Gravity, Urine: 1.016
Urobilinogen, UA: 1
pH: 6

## 2011-03-08 LAB — COMPREHENSIVE METABOLIC PANEL
BUN: 12
CO2: 24
Calcium: 9.3
Creatinine, Ser: 1.12
GFR calc non Af Amer: >60
Glucose, Bld: 138 — ABNORMAL HIGH

## 2011-03-08 LAB — DIFFERENTIAL
Lymphocytes Relative: 17
Lymphs Abs: 0.8
Neutrophils Relative %: 61

## 2011-03-08 LAB — LIPASE, BLOOD: Lipase: 33

## 2011-03-18 ENCOUNTER — Other Ambulatory Visit: Payer: Self-pay | Admitting: Cardiology

## 2011-03-19 ENCOUNTER — Other Ambulatory Visit: Payer: Self-pay | Admitting: *Deleted

## 2011-03-19 MED ORDER — CLOPIDOGREL BISULFATE 75 MG PO TABS: 75.0000 mg | ORAL_TABLET | Freq: Every day | ORAL | Status: DC

## 2011-03-22 ENCOUNTER — Other Ambulatory Visit: Payer: Self-pay | Admitting: Cardiovascular Disease

## 2011-03-23 ENCOUNTER — Other Ambulatory Visit: Payer: Self-pay | Admitting: Cardiology

## 2011-06-17 ENCOUNTER — Other Ambulatory Visit: Payer: Managed Care, Other (non HMO) | Admitting: *Deleted

## 2011-06-19 ENCOUNTER — Other Ambulatory Visit: Payer: Self-pay | Admitting: Cardiovascular Disease

## 2011-06-22 ENCOUNTER — Ambulatory Visit (INDEPENDENT_AMBULATORY_CARE_PROVIDER_SITE_OTHER): Payer: Managed Care, Other (non HMO) | Admitting: Cardiovascular Disease

## 2011-06-22 ENCOUNTER — Other Ambulatory Visit (INDEPENDENT_AMBULATORY_CARE_PROVIDER_SITE_OTHER): Payer: Managed Care, Other (non HMO) | Admitting: *Deleted

## 2011-06-22 ENCOUNTER — Encounter: Payer: Self-pay | Admitting: Cardiovascular Disease

## 2011-06-22 VITALS — BP 160/84 | HR 67 | Ht 70.0 in | Wt 212.1 lb

## 2011-06-22 DIAGNOSIS — E7800 Pure hypercholesterolemia, unspecified: Secondary | ICD-10-CM

## 2011-06-22 DIAGNOSIS — I2510 Atherosclerotic heart disease of native coronary artery without angina pectoris: Secondary | ICD-10-CM

## 2011-06-22 DIAGNOSIS — I10 Essential (primary) hypertension: Secondary | ICD-10-CM

## 2011-06-22 LAB — LIPID PANEL
Cholesterol: 150 mg/dL (ref 0–200)
HDL: 67 mg/dL (ref >39.00)
LDL Cholesterol: 74 mg/dL (ref 0–99)
Total CHOL/HDL Ratio: 2
Triglycerides: 47 mg/dL (ref 0.0–149.0)

## 2011-06-22 LAB — CBC WITH DIFFERENTIAL/PLATELET
Basophils Relative: 0.6 % (ref 0.0–3.0)
Eosinophils Absolute: 0.4 10*3/uL (ref 0.0–0.7)
MCHC: 34.7 g/dL (ref 30.0–36.0)
MCV: 105.2 fl — ABNORMAL HIGH (ref 78.0–100.0)
Monocytes Absolute: 1 10*3/uL (ref 0.1–1.0)
Neutrophils Relative %: 69.8 % (ref 43.0–77.0)
Platelets: 205 10*3/uL (ref 150.0–400.0)
RBC: 3.94 Mil/uL — ABNORMAL LOW (ref 4.22–5.81)
RDW: 12.8 % (ref 11.5–14.6)

## 2011-06-22 LAB — BASIC METABOLIC PANEL
Calcium: 9.4 mg/dL (ref 8.4–10.5)
Creatinine, Ser: 1 mg/dL (ref 0.4–1.5)
GFR: 85.12 mL/min (ref >60.00)

## 2011-06-22 LAB — HEPATIC FUNCTION PANEL
Bilirubin, Direct: 0.3 mg/dL (ref 0.0–0.3)
Total Bilirubin: 1.7 mg/dL — ABNORMAL HIGH (ref 0.3–1.2)

## 2011-06-22 MED ORDER — LORAZEPAM 0.5 MG PO TABS: 0.5000 mg | ORAL_TABLET | Freq: Three times a day (TID) | ORAL | Status: AC | PRN

## 2011-06-22 NOTE — Patient Instructions (Signed)
Your physician recommends that you schedule a follow-up appointment in: 4 MONTHS with Truitt Merle NP  Your physician recommends that you continue on your current medications as directed. Please refer to the Current Medication list given to you today.

## 2011-07-02 NOTE — Assessment & Plan Note (Signed)
The patient has white coat hypertension. Blood pressures at home as been running in the 120s. We'll continue current medical program. I wrote him a short-term prescription for Ativan to be used for situational anxiety related to air travel. I asked him to request refills from his primary care physician if he needs this.

## 2011-07-02 NOTE — Assessment & Plan Note (Signed)
Lipids are excellent with an LDL of 74 and an HDL of 67. Continue Crestor.

## 2011-07-02 NOTE — Progress Notes (Signed)
HPI:  61 year old gentleman presenting for followup. He has coronary and peripheral arterial disease.  The patient has undergone bilateral carotid endarterectomy and iliac stenting. He has coronary artery disease which has been managed medically. He has known total occlusion of the right coronary artery and mild to moderate LAD stenosis.  From a symptomatic perspective, he is doing well. He denies stroke or TIA symptoms. He's had no chest pain or pressure. He has mild dyspnea with exertion unchanged over time. He denies leg swelling, orthopnea, PND, or palpitations.  The patient has difficulty with anxiety issues. He is going to travel with his wife soon in requests a prescription for an anxiolytic to help him with flying.  Outpatient Encounter Prescriptions as of 06/22/2011  Medication Sig Dispense Refill  . amLODipine (NORVASC) 2.5 MG tablet Take 1 tablet (2.5 mg total) by mouth daily.  30 tablet  11  . aspirin 81 MG tablet Take 81 mg by mouth daily.        . carvedilol (COREG) 25 MG tablet TAKE 1 AND 1/2 TABLETS BY MOUTH 2 TIMES A DAY  90 tablet  3  . clopidogrel (PLAVIX) 75 MG tablet Take 1 tablet (75 mg total) by mouth daily.  30 tablet  6  . CRESTOR 20 MG tablet take 1 tablet by mouth once daily  30 tablet  PRN  . folic acid (FOLVITE) 1 MG tablet take 1 tablet by mouth once daily  30 tablet  6  . lisinopril (PRINIVIL,ZESTRIL) 20 MG tablet Take 1 tablet (20 mg total) by mouth 2 (two) times daily.  60 tablet  6  . nitroGLYCERIN (NITROSTAT) 0.4 MG SL tablet Place 1 tablet (0.4 mg total) under the tongue every 5 (five) minutes as needed.  25 tablet  6  . ranitidine (ZANTAC) 150 MG tablet Take 2 tablets (300 mg total) by mouth 1 dose over 24 hours.  30 tablet  11  . Thiamine HCl (VITAMIN B-1) 100 MG tablet Take 100 mg by mouth daily.        Marland Kitchen LORazepam (ATIVAN) 0.5 MG tablet Take 1 tablet (0.5 mg total) by mouth every 8 (eight) hours as needed for anxiety.  6 tablet  0    No Known  Allergies  Past Medical History  Diagnosis Date  . Hypertension   . Hyperlipidemia   . Coronary artery disease   . AAA (abdominal aortic aneurysm)   . PAD (peripheral artery disease)   . COPD (chronic obstructive pulmonary disease)   . Raynaud's disease /phenomenon   . Lumbar degenerative disc disease     ROS: Negative except as per HPI  BP 160/84  Pulse 67  Ht 5\' 10"  (1.778 m)  Wt 96.217 kg (212 lb 1.9 oz)  BMI 30.44 kg/m2  PHYSICAL EXAM: Pt is alert and oriented, NAD HEENT: normal Neck: JVP - normal, carotids 2+= with bilateral bruits Lungs: CTA bilaterally CV: RRR without murmur or gallop Abd: soft, NT, Positive BS, no hepatomegaly Ext: no C/C/E, distal pulses intact and equal Skin: warm/dry no rash  EKG:  Normal sinus rhythm 67 beats per minute, cannot rule out anterior infarct age undetermined.  ASSESSMENT AND PLAN:

## 2011-07-02 NOTE — Assessment & Plan Note (Addendum)
The patient is stable without angina. We'll continue his current medical program which includes aspirin, Plavix, a beta blocker, ACE inhibitor, and a statin drug.  Will arrange followup with Truitt Merle in 4 months.

## 2011-08-12 ENCOUNTER — Other Ambulatory Visit: Payer: Self-pay | Admitting: Cardiovascular Disease

## 2011-08-16 ENCOUNTER — Other Ambulatory Visit: Payer: Self-pay | Admitting: Cardiovascular Disease

## 2011-08-16 NOTE — Telephone Encounter (Signed)
New Msg: Pt wife calling regarding pt needing RX of carvedilol called in ASAP. Please call RX in asap.

## 2011-08-17 MED ORDER — CARVEDILOL 25 MG PO TABS: ORAL_TABLET | ORAL | Status: DC

## 2011-08-20 ENCOUNTER — Other Ambulatory Visit: Payer: Self-pay | Admitting: Cardiovascular Disease

## 2011-08-27 ENCOUNTER — Telehealth: Payer: Self-pay | Admitting: Cardiovascular Disease

## 2011-08-27 NOTE — Telephone Encounter (Signed)
Walk In pt Form " Pt Dropped Off Disability Parking Card Form" to be completed  Placed In [facility] box Ander Purpura Off today) 08/27/11/KM

## 2011-09-01 ENCOUNTER — Other Ambulatory Visit: Payer: Private Health Insurance - Indemnity

## 2011-09-01 ENCOUNTER — Ambulatory Visit: Payer: Self-pay | Admitting: Neurosurgery

## 2011-09-06 ENCOUNTER — Encounter: Payer: Self-pay | Admitting: Neurosurgery

## 2011-09-07 ENCOUNTER — Ambulatory Visit (INDEPENDENT_AMBULATORY_CARE_PROVIDER_SITE_OTHER): Payer: Managed Care, Other (non HMO) | Admitting: Neurosurgery

## 2011-09-07 ENCOUNTER — Ambulatory Visit (INDEPENDENT_AMBULATORY_CARE_PROVIDER_SITE_OTHER): Payer: Managed Care, Other (non HMO) | Admitting: Vascular Surgery

## 2011-09-07 ENCOUNTER — Encounter: Payer: Self-pay | Admitting: Neurosurgery

## 2011-09-07 VITALS — BP 162/93 | HR 72 | Resp 16 | Ht 71.0 in | Wt 213.3 lb

## 2011-09-07 DIAGNOSIS — Z48812 Encounter for surgical aftercare following surgery on the circulatory system: Secondary | ICD-10-CM

## 2011-09-07 DIAGNOSIS — I714 Abdominal aortic aneurysm, without rupture, unspecified: Secondary | ICD-10-CM

## 2011-09-07 DIAGNOSIS — I739 Peripheral vascular disease, unspecified: Secondary | ICD-10-CM

## 2011-09-07 DIAGNOSIS — I6529 Occlusion and stenosis of unspecified carotid artery: Secondary | ICD-10-CM

## 2011-09-07 DIAGNOSIS — I6523 Occlusion and stenosis of bilateral carotid arteries: Secondary | ICD-10-CM | POA: Insufficient documentation

## 2011-09-07 MED ORDER — CILOSTAZOL 100 MG PO TABS: 100.0000 mg | ORAL_TABLET | Freq: Two times a day (BID) | ORAL | Status: DC

## 2011-09-07 NOTE — Progress Notes (Signed)
Addended by: Mena Goes on: 09/07/2011 12:42 PM   Modules accepted: Orders

## 2011-09-07 NOTE — Progress Notes (Signed)
[facility] [place] HISTORY AND PHYSICAL   CC: Patient returns for annual followup for AAA duplex, carotid duplex and lower extremity ABIs Referring Physician: Bridgett Larsson  History of Present Illness: 61 year old male patient of Dr. Bridgett Larsson followed for multiple vascular issues. Reports no signs of amaurosis fugax, CVA, TIA, diplopia, dysphasia or word finding difficulties. He has no abdominal or back pain. He does report claudication symptoms right greater than left with ambulation. He does state this has been an ongoing problem and has not needed intervention to this point. Patient does have a previous surgical history of a left CEA January of 2007 and the left ECA PTCA in May of 2007. This reports no other new medical problems or recent surgeries.  Past Medical History  Diagnosis Date  . Hypertension   . Hyperlipidemia   . Coronary artery disease   . AAA (abdominal aortic aneurysm)   . PAD (peripheral artery disease)   . COPD (chronic obstructive pulmonary disease)   . Raynaud's disease /phenomenon   . Lumbar degenerative disc disease   . Stroke 10/15/2005    ROS: [x]  Positive   [ ]  Denies    General: [ ]  Weight loss, [ ]  Fever, [ ]  chills Neurologic: [ ]  Dizziness, [ ]  Blackouts, [ ]  Seizure [ ]  Stroke, [ ]  "Mini stroke", [ ]  Slurred speech, [ ]  Temporary blindness; [ ]  weakness in arms or legs, [ ]  Hoarseness Cardiac: [ ]  Chest pain/pressure, [ ]  Shortness of breath at rest [ ]  Shortness of breath with exertion, [ ]  Atrial fibrillation or irregular heartbeat Vascular: [ ]  Pain in legs with walking, [ ]  Pain in legs at rest, [ ]  Pain in legs at night,  [ ]  Non-healing ulcer, [ ]  Blood clot in vein/DVT,   Pulmonary: [ ]  Home oxygen, [ ]  Productive cough, [ ]  Coughing up blood, [ ]  Asthma,  [ ]  Wheezing Musculoskeletal:  [ ]  Arthritis, [ ]  Low back pain, [ ]  Joint pain Hematologic: [ ]  Easy Bruising, [ ]  Anemia; [ ]  Hepatitis Gastrointestinal: [ ]  Blood in stool, [ ]   Gastroesophageal Reflux/heartburn, [ ]  Trouble swallowing Urinary: [ ]  chronic Kidney disease, [ ]  on HD - [ ]  MWF or [ ]  TTHS, [ ]  Burning with urination, [ ]  Difficulty urinating Skin: [ ]  Rashes, [ ]  Wounds Psychological: [ ]  Anxiety, [ ]  Depression   Social History History  Substance Use Topics  . Smoking status: Former Smoker    Types: Cigarettes    Quit date: 05/24/2005  . Smokeless tobacco: Not on file  . Alcohol Use: Yes     3-4  beers a day    Family History Family History  Problem Relation Age of Onset  . Coronary artery disease Father 87    HTN, Hyperlipidemia died  . Heart disease Father     Heart Disease before age 69  . Hypertension Father   . Hyperlipidemia Father   . Peripheral vascular disease Father   . Other Mother 32     died rheumatoid arthritis  . Heart disease Brother   . Hypertension Brother   . Hyperlipidemia Brother     No Known Allergies  Current Outpatient Prescriptions  Medication Sig Dispense Refill  . amLODipine (NORVASC) 2.5 MG tablet Take 1 tablet (2.5 mg total) by mouth daily.  30 tablet  11  . aspirin 81 MG tablet Take 81 mg by mouth daily.        . carvedilol (COREG)  25 MG tablet Take one and one-half tabs by mouth twice daily.  90 tablet  3  . clopidogrel (PLAVIX) 75 MG tablet Take 1 tablet (75 mg total) by mouth daily.  30 tablet  6  . CRESTOR 20 MG tablet take 1 tablet by mouth once daily  30 tablet  PRN  . folic acid (FOLVITE) 1 MG tablet take 1 tablet by mouth once daily  30 tablet  6  . lisinopril (PRINIVIL,ZESTRIL) 20 MG tablet take 1 tablet by mouth twice a day  60 tablet  6  . nitroGLYCERIN (NITROSTAT) 0.4 MG SL tablet Place 1 tablet (0.4 mg total) under the tongue every 5 (five) minutes as needed.  25 tablet  6  . ranitidine (ZANTAC) 150 MG tablet Take 2 tablets (300 mg total) by mouth 1 dose over 24 hours.  30 tablet  11  . Thiamine HCl (VITAMIN B-1) 100 MG tablet Take 100 mg by mouth daily.          Physical  Examination  Filed Vitals:   09/07/11 1056  BP: 162/93  Pulse: 72  Resp: 16    Body mass index is 29.75 kg/(m^2).  General:  WDWN in NAD Gait: Normal HEENT: WNL Eyes: Pupils equal Pulmonary: normal non-labored breathing , without Rales, rhonchi,  wheezing Cardiac: RRR, without  Murmurs, rubs or gallops; Abdomen: soft, NT, no masses Skin: no rashes, ulcers noted  Vascular Exam Pulses: Patient has 2+ radial pulses bilaterally, right lower extremity PT and DP are only heard with Doppler, left PT and DP are 2+, there is no palpable abdominal mass, femoral pulses are palpable bilaterally Carotid bruits: 2+ bilateral carotid pulses to auscultation Extremities without ischemic changes, no Gangrene , no cellulitis; no open wounds;  Musculoskeletal: no muscle wasting or atrophy   Neurologic: A&O X 3; Appropriate Affect ; SENSATION: normal; MOTOR FUNCTION:  moving all extremities equally. Speech is fluent/normal  Non-Invasive Vascular Imaging CAROTID DUPLEX 09/07/2011  Right ICA 20 - 39 % stenosis Left ICA 20 - 39 % stenosis ABI:(L)0.97,(R) 0.50,,,previous 04/2010 (L)-0.99, (R)-0.78 AAA Duplex-4.32 AP, 4.35 transverse,,, previous from December 2011 was 3.84 AP, 4.05 transverse   ASSESSMENT/PLAN: Is a patient with multiple vascular issues with multiple vascular studies today #1 regarding the duplex of the abdominal aortic aneurysm it is increased from 3.84 AP and transverse for 4.05 to 4.3 to AP and 4.35 transverse, given his right lower extremity difficulties and increase in the AAA we will restudy this with duplex in 6 months. #2 His carotid duplex shows minimal stenosis with no change in prior exam, we will repeat this study in one year. #3 his lower arterial ABI has dropped on the right to 0.50 from 0.78 in December of 201 he was previously 0.99 today is 0.97. I did speak with Dr. Trula Slade who is on call and his suggestion is to start the patient on 100 mg of Pletal twice a day for 4 weeks  and bring the patient back to see Dr. Bridgett Larsson for evaluation of medication. This was explained to the patient and his wife their questions were encouraged and answered and they are in agreement with this I did issue a six-month temporary parking placard.  Beatris Ship ANP Clinic MD: Trula Slade

## 2011-09-08 ENCOUNTER — Telehealth: Payer: Self-pay | Admitting: Cardiovascular Disease

## 2011-09-08 ENCOUNTER — Other Ambulatory Visit: Payer: Self-pay | Admitting: *Deleted

## 2011-09-08 DIAGNOSIS — Z48812 Encounter for surgical aftercare following surgery on the circulatory system: Secondary | ICD-10-CM

## 2011-09-08 DIAGNOSIS — I6529 Occlusion and stenosis of unspecified carotid artery: Secondary | ICD-10-CM

## 2011-09-08 DIAGNOSIS — I714 Abdominal aortic aneurysm, without rupture: Secondary | ICD-10-CM

## 2011-09-08 DIAGNOSIS — I739 Peripheral vascular disease, unspecified: Secondary | ICD-10-CM

## 2011-09-08 NOTE — Telephone Encounter (Signed)
New Problem:     Patient was prescribed some medication by his neurologist office and she was uncomfortable giving it to him without a cardiological ok. Please call back.

## 2011-09-08 NOTE — Telephone Encounter (Signed)
I spoke with the pt's wife and she wanted to make [place] aware that [facility] saw the pt yesterday and started the pt on Pletal.  She wanted to make sure this medication is okay to take while on ASA and plavix (pharmacist questioned if the pt's plavix had been stopped).  I made her aware that pt's can take these 3 medications together.  I did make her aware that Dr Burt Knack has his pt's start Pletal 100mg  one-half tablet twice a day for one week and then increase to one tablet twice a day due to possible side effects (rapid pulse). She will do this with the pt's pletal.

## 2011-09-08 NOTE — Telephone Encounter (Signed)
Handicap form placed at front desk for pick-up.  Left message on pt's voicemail that form is ready for pick-up.

## 2011-09-08 NOTE — Telephone Encounter (Signed)
I made Dr Burt Knack aware that the pt was started on Pletal.

## 2011-09-17 ENCOUNTER — Telehealth: Payer: Self-pay | Admitting: Cardiovascular Disease

## 2011-09-17 NOTE — Procedures (Unsigned)
DUPLEX ULTRASOUND OF ABDOMINAL AORTA  INDICATION:  Abdominal aortic aneurysm.  HISTORY: Diabetes:  No. Cardiac:  CAD. Hypertension:  Yes. Smoking:  Previous. Connective Tissue Disorder: Family History:  Father. Previous Surgery:  Left common iliac artery and external iliac artery percutaneous transluminal angioplasty on 10/15/2005.  DUPLEX EXAM:         AP (cm)                   TRANSVERSE (cm) Proximal             2.62 cm                   2.52 cm Mid                  3.58 cm                   3.58 cm Distal               4.32 cm                   4.35 cm Right Iliac          1.28 cm                   1.38 cm Left Iliac           0.92 cm                   0.88 cm  PREVIOUS:  Date: 05/01/2010  AP:  3.84  TRANSVERSE:  4.05  IMPRESSION: 1. Infrarenal abdominal aortic aneurysm present measuring 4.32 cm x     4.35 cm, with intramural thrombus present. 2. Elevated velocities present involving the left iliac artery system     which may suggest stenosis. 3. Abnormal dampened waveforms involving the right iliac artery system     which suggests stenosis versus occlusive process. 4. Technically difficult and limited evaluation of the iliac arteries     due to depth, tortuosity, and disease present making Doppler     interrogation difficult.  ___________________________________________ Conrad [place], MD  SH/MEDQ  D:  09/07/2011  T:  09/07/2011  Job:  [id number]

## 2011-09-17 NOTE — Procedures (Unsigned)
CAROTID DUPLEX EXAM  INDICATION:  Carotid stenosis  HISTORY: Diabetes:  No Cardiac:  CAD Hypertension:  Yes Smoking:  Previous Previous Surgery:  Left carotid endarterectomy on 06/08/2005; right carotid endarterectomy on 04/13/2005 CV History:  Asymptomatic Amaurosis Fugax No, Paresthesias No, Hemiparesis No                                      RIGHT             LEFT Brachial systolic pressure:         188               189 Brachial Doppler waveforms:         WNL               WNL Vertebral direction of flow:        Antegrade         Antegrade DUPLEX VELOCITIES (cm/sec) CCA peak systolic                   98                0000000 ECA peak systolic                   92                [id number] ICA peak systolic                   57                66 ICA end diastolic                   11                14 PLAQUE MORPHOLOGY:                  Heterogeneous     Heterogeneous PLAQUE AMOUNT:                      Mild              Mild PLAQUE LOCATION:                    CCA               CCA  IMPRESSION: 1. Bilateral internal carotid arteries patent with history of     endarterectomy, no hyperplasia or hemodynamically significant     stenosis present. 2. Bilateral external carotid arteries are patent. 3. Bilateral vertebral arteries are patent and antegrade. 4. Unchanged since previous study on 08/14/2010.  ___________________________________________ Conrad Abie, MD  SH/MEDQ  D:  09/07/2011  T:  09/07/2011  Job:  [id number]

## 2011-09-17 NOTE — Telephone Encounter (Signed)
Wife is coming to pick up husband's records. Authorization signed 4/22. Will give her Dr. Antionette Char LOV. EMG

## 2011-10-07 ENCOUNTER — Encounter: Payer: Self-pay | Admitting: Vascular Surgery

## 2011-10-08 ENCOUNTER — Ambulatory Visit (INDEPENDENT_AMBULATORY_CARE_PROVIDER_SITE_OTHER): Payer: Managed Care, Other (non HMO) | Admitting: Vascular Surgery

## 2011-10-08 ENCOUNTER — Encounter: Payer: Self-pay | Admitting: Vascular Surgery

## 2011-10-08 VITALS — BP 100/70 | HR 76 | Resp 20 | Ht 71.0 in | Wt 211.0 lb

## 2011-10-08 DIAGNOSIS — I6523 Occlusion and stenosis of bilateral carotid arteries: Secondary | ICD-10-CM

## 2011-10-08 DIAGNOSIS — I70219 Atherosclerosis of native arteries of extremities with intermittent claudication, unspecified extremity: Secondary | ICD-10-CM

## 2011-10-08 DIAGNOSIS — I658 Occlusion and stenosis of other precerebral arteries: Secondary | ICD-10-CM

## 2011-10-08 DIAGNOSIS — I714 Abdominal aortic aneurysm, without rupture: Secondary | ICD-10-CM

## 2011-10-08 MED ORDER — CILOSTAZOL 100 MG PO TABS: 100.0000 mg | ORAL_TABLET | Freq: Two times a day (BID) | ORAL | Status: DC

## 2011-10-08 NOTE — Progress Notes (Signed)
[facility] [place]  Established Intermittent Claudication  History of Present Illness  [name] is a 61 y.o. (05-04-1951) male who presents with chief complaint: difficulty walking.  Pt is followed for multiple vascular issues.  First, he previously had L CIA and EIA stenting.  He notes claudication sx after 1/4 mile.  He denies any tissue loss or rest pain.  Second, he denies any TIA or CVA sx.  He is s/p B CEA.  Finally, he has a known AAA and denies any back or abdominal pain.    Past Medical History, Past Surgical History, Social History, Family History, Medications, Allergies, and Review of Systems are unchanged from previous evaluation on 09/07/11.  Physical Examination  Filed Vitals:   10/08/11 0908 10/08/11 0909  BP: 121/67 100/70  Pulse: 77 76  Resp: 20 20  Height: 5\' 11"  (1.803 m)   Weight: 211 lb (95.709 kg)    Body mass index is 29.43 kg/(m^2).  General: A&O x 3, WDWN  Pulmonary: Sym exp, good air movt, CTAB, no rales, rhonchi, & wheezing  Cardiac: RRR, Nl S1, S2, no Murmurs, rubs or gallops  Vascular: Vessel Right Left  Radial Palpable Palpable  Brachial Palpable Palpable  Carotid Palpable, without bruit Palpable, without bruit  Aorta Non-palpable N/A  Femoral Palpable Palpable  Popliteal Non-palpable Non-palpable  PT Non-Palpable Palpable  DP Non-Palpable Palpable   Musculoskeletal: M/S 5/5 throughout , Extremities without ischemic changes   Neurologic: CN 2-12 intact, Pain and light touch intact in extremities , Motor exam as listed above  Non-Invasive Vascular Imaging ABI (Date: 10/08/11)  RLE: 0.50, PT and DP: mono  LLE: 1.02, DP and PT: triphasic  AAA Duplex (Date: 10/08/11)  Ao: 4.32 cm x 4.35 cm (Prev 3.84 cm x 4.05 cm)  R iliac: 1.28 cm x 1.38 cm  L iliac: 0.92 cm x 0.88 cm  Dampened R iliac waveforms  CAROTID DUPLEX (Date: 10/08/11):   R ICA stenosis: widely patent s/p CEA  R VA: patent and antegrade  L ICA  stenosis: widely patent s/p CEA  L VA: patent and antegrade  Medical Decision Making  [name] is a 61 y.o. male who presents with: R leg intermittent claudication without evidence of critical limb ischemia, small AAA, and s/p B CEA  Based on the patient's vascular studies and examination, I have offered the patient: continued surveillance - B carotid duplex annually, AAA duplex in 6 months, and the BLE ABI in 3 months.  Patient is on Pletal and will continue with trial of such.  He does note some diarrhea since starting the medication  I suspect some degree of inflow disease on the right side so angiogram with intervention will likely be needed eventually.  At this point, maximal medical management is being attempted along with a walking plan.  He will follow up in 3 months for further evaluation of his RLE IC.  This patient carotid disease is stable and likely will never need further intervention.  The patient's AAA demonstrates 0.5 cm growth / 2.5 years, so continued observation is recommended.  He is neither sx or does it mean size criteria for repair.  I discussed in depth with the patient the nature of atherosclerosis, and emphasized the importance of maximal medical management including strict control of blood pressure, blood glucose, and lipid levels, antiplatelet agents, obtaining regular exercise, and cessation of smoking.  The patient is aware that without maximal medical management the underlying atherosclerotic disease process will progress,  limiting the benefit of any interventions.  Thank you for allowing [place] to participate in this patient's care.  Adele Barthel, MD [facility] [place] Office: [phone] Pager: [phone]  10/08/2011, 8:01 PM

## 2011-10-08 NOTE — Progress Notes (Signed)
Addended by: Conrad Piru on: 10/08/2011 08:21 PM   Modules accepted: Orders

## 2011-10-19 ENCOUNTER — Encounter: Payer: Self-pay | Admitting: Nurse Practitioner

## 2011-10-19 ENCOUNTER — Ambulatory Visit (INDEPENDENT_AMBULATORY_CARE_PROVIDER_SITE_OTHER): Payer: Managed Care, Other (non HMO) | Admitting: Nurse Practitioner

## 2011-10-19 VITALS — BP 118/66 | HR 72 | Ht 71.0 in | Wt 208.0 lb

## 2011-10-19 DIAGNOSIS — E785 Hyperlipidemia, unspecified: Secondary | ICD-10-CM

## 2011-10-19 DIAGNOSIS — I10 Essential (primary) hypertension: Secondary | ICD-10-CM

## 2011-10-19 DIAGNOSIS — I739 Peripheral vascular disease, unspecified: Secondary | ICD-10-CM

## 2011-10-19 DIAGNOSIS — I2510 Atherosclerotic heart disease of native coronary artery without angina pectoris: Secondary | ICD-10-CM

## 2011-10-19 NOTE — Patient Instructions (Addendum)
We will have you come back for blood work within the next week  I will see you in 4 months  Stay on your current medicines  Let Dr. Bridgett Larsson know about stopping the Pletal  Call the [facility] office at [phone] if you have any questions, problems or concerns.

## 2011-10-19 NOTE — Assessment & Plan Note (Signed)
His cardiac status seems stable. We will check fasting labs later this week. Will see him back in about 4 to 6 months. Patient is agreeable to this plan and will call if any problems develop in the interim.

## 2011-10-19 NOTE — Progress Notes (Signed)
Braxtyn Best Date of Birth: [birth year] Medical Record [id number]  History of Present Illness: [name] is seen today for a 61 month check. He is seen for Dr. Burt Knack. He has an extensive history of PVD and CAD. He sees Dr. Bridgett Larsson. He had remote cath in 2006 showing normal LV function. He has a total right coronary with left to right collaterals. He has distal small vessel disease in the RCA. He has diffuse mild to moderate stenosis in the LAD. He has never had a MI. He is no longer smoking. His PVD is quite extensive as well. He has been given Pletal recently. Other problems include HTN, HLD, and OA.  He comes in today. He is here with his wife. He says his heart is doing great. No chest pain. Not short of breath. Not really able to do much due to his cluadication. Did try Pletal but had significant diarrhea. His wife needs to let Dr. Bridgett Larsson know that it has been stopped. He does have some burning in his hips with walking. Blood pressure has been doing well.   Current Outpatient Prescriptions on File Prior to Visit  Medication Sig Dispense Refill  . amLODipine (NORVASC) 2.5 MG tablet Take 1 tablet (2.5 mg total) by mouth daily.  30 tablet  11  . aspirin 81 MG tablet Take 81 mg by mouth daily.        . carvedilol (COREG) 25 MG tablet Take one and one-half tabs by mouth twice daily.  90 tablet  3  . clopidogrel (PLAVIX) 75 MG tablet Take 1 tablet (75 mg total) by mouth daily.  30 tablet  6  . CRESTOR 20 MG tablet take 1 tablet by mouth once daily  30 tablet  PRN  . folic acid (FOLVITE) 1 MG tablet take 1 tablet by mouth once daily  30 tablet  6  . lisinopril (PRINIVIL,ZESTRIL) 20 MG tablet take 1 tablet by mouth twice a day  60 tablet  6  . nitroGLYCERIN (NITROSTAT) 0.4 MG SL tablet Place 1 tablet (0.4 mg total) under the tongue every 5 (five) minutes as needed.  25 tablet  6  . Thiamine HCl (VITAMIN B-1) 100 MG tablet Take 100 mg by mouth daily.        . cilostazol (PLETAL) 100 MG tablet Take 1  tablet (100 mg total) by mouth 2 (two) times daily before a meal.  60 tablet  11  . ranitidine (ZANTAC) 150 MG tablet Take 2 tablets (300 mg total) by mouth 1 dose over 24 hours.  30 tablet  11    No Known Allergies  Past Medical History  Diagnosis Date  . Hypertension   . Hyperlipidemia   . Coronary artery disease   . AAA (abdominal aortic aneurysm)   . PAD (peripheral artery disease)   . COPD (chronic obstructive pulmonary disease)   . Raynaud's disease /phenomenon   . Lumbar degenerative disc disease   . Stroke 10/15/2005    Past Surgical History  Procedure Date  . Iliac artery stent 10/15/2005     Left common and external   . Cardiac catheterization 2006  . Carotid endarterectomy     bilateral  . Carotid endarterectomy 04/13/2005    Right and 06/03/2005 Left    History  Smoking status  . Former Smoker  . Types: Cigarettes  . Quit date: 05/24/2005  Smokeless tobacco  . Never Used    History  Alcohol Use  . Yes    3-4  beers a day    Family History  Problem Relation Age of Onset  . Coronary artery disease Father 37    HTN, Hyperlipidemia died  . Heart disease Father     Heart Disease before age 75  . Hypertension Father   . Hyperlipidemia Father   . Peripheral vascular disease Father   . Other Mother 77     died rheumatoid arthritis  . Heart disease Brother   . Hypertension Brother   . Hyperlipidemia Brother     Review of Systems: The review of systems is per the HPI.  All other systems were reviewed and are negative.  Physical Exam: BP 118/66  Pulse 72  Ht 5\' 11"  (1.803 m)  Wt 208 lb (94.348 kg)  BMI 29.01 kg/m2 Patient is very pleasant and in no acute distress. He is overweight. Skin is warm and dry. Color is normal.  HEENT is unremarkable. Normocephalic/atraumatic. PERRL. Sclera are nonicteric. Neck is supple. Bilateral CEA scars noted. No masses. No JVD. Lungs are clear. Cardiac exam shows a regular rate and rhythm. Abdomen is soft. Extremities  are without edema. Gait and ROM are intact. No gross neurologic deficits noted.   LABORATORY DATA: Fasting labs are pending for later this week.    Lab Results  Component Value Date   WBC 6.9 06/22/2011   HGB 14.4 06/22/2011   HCT 41.5 06/22/2011   PLT 205.0 06/22/2011   GLUCOSE 109* 06/22/2011   CHOL 150 06/22/2011   TRIG 47.0 06/22/2011   HDL 67.00 06/22/2011   LDLCALC 74 06/22/2011   ALT 21 06/22/2011   AST 33 06/22/2011   NA 131* 06/22/2011   K 4.6 06/22/2011   CL 93* 06/22/2011   CREATININE 1.0 06/22/2011   BUN 11 06/22/2011   CO2 28 06/22/2011    Assessment / Plan:

## 2011-10-19 NOTE — Assessment & Plan Note (Signed)
Blood pressure looks great with his current regimen.

## 2011-10-19 NOTE — Assessment & Plan Note (Signed)
Followed at [facility]. I have asked his wife to let Dr. Bridgett Larsson know that he is not able to tolerate the Pletal due to significant diarrhea.

## 2011-10-23 ENCOUNTER — Other Ambulatory Visit: Payer: Self-pay | Admitting: Cardiology

## 2011-10-28 ENCOUNTER — Other Ambulatory Visit (INDEPENDENT_AMBULATORY_CARE_PROVIDER_SITE_OTHER): Payer: Managed Care, Other (non HMO)

## 2011-10-28 DIAGNOSIS — E785 Hyperlipidemia, unspecified: Secondary | ICD-10-CM

## 2011-10-28 LAB — HEPATIC FUNCTION PANEL
ALT: 16 U/L (ref 0–53)
AST: 24 U/L (ref 0–37)
Albumin: 4.1 g/dL (ref 3.5–5.2)
Alkaline Phosphatase: 86 U/L (ref 39–117)
Bilirubin, Direct: 0.3 mg/dL (ref 0.0–0.3)
Total Bilirubin: 2.3 mg/dL — ABNORMAL HIGH (ref 0.3–1.2)
Total Protein: 7.2 g/dL (ref 6.0–8.3)

## 2011-10-28 LAB — BASIC METABOLIC PANEL
BUN: 11 mg/dL (ref 6–23)
CO2: 26 mEq/L (ref 19–32)
Calcium: 9.5 mg/dL (ref 8.4–10.5)
Chloride: 98 mEq/L (ref 96–112)
Creatinine, Ser: 0.9 mg/dL (ref 0.4–1.5)
GFR: 92.87 mL/min (ref >60.00)
Glucose, Bld: 88 mg/dL (ref 70–99)
Potassium: 5.4 mEq/L — ABNORMAL HIGH (ref 3.5–5.1)
Sodium: 132 mEq/L — ABNORMAL LOW (ref 135–145)

## 2011-10-28 LAB — LIPID PANEL
Cholesterol: 135 mg/dL (ref 0–200)
HDL: 65.7 mg/dL (ref >39.00)
LDL Cholesterol: 56 mg/dL (ref 0–99)
Total CHOL/HDL Ratio: 2
Triglycerides: 69 mg/dL (ref 0.0–149.0)
VLDL: 13.8 mg/dL (ref 0.0–40.0)

## 2011-11-02 ENCOUNTER — Other Ambulatory Visit: Payer: Self-pay | Admitting: Nurse Practitioner

## 2011-11-02 DIAGNOSIS — E7800 Pure hypercholesterolemia, unspecified: Secondary | ICD-10-CM

## 2011-11-22 ENCOUNTER — Other Ambulatory Visit (INDEPENDENT_AMBULATORY_CARE_PROVIDER_SITE_OTHER): Payer: Managed Care, Other (non HMO)

## 2011-11-22 DIAGNOSIS — E7800 Pure hypercholesterolemia, unspecified: Secondary | ICD-10-CM

## 2011-11-22 LAB — BASIC METABOLIC PANEL
BUN: 8 mg/dL (ref 6–23)
CO2: 27 mEq/L (ref 19–32)
Calcium: 9.6 mg/dL (ref 8.4–10.5)
Chloride: 93 mEq/L — ABNORMAL LOW (ref 96–112)
Creatinine, Ser: 0.7 mg/dL (ref 0.4–1.5)
GFR: 115.2 mL/min (ref >60.00)
Glucose, Bld: 107 mg/dL — ABNORMAL HIGH (ref 70–99)
Potassium: 4.7 mEq/L (ref 3.5–5.1)
Sodium: 130 mEq/L — ABNORMAL LOW (ref 135–145)

## 2011-11-22 LAB — HEPATIC FUNCTION PANEL
ALT: 14 U/L (ref 0–53)
AST: 24 U/L (ref 0–37)
Albumin: 4.2 g/dL (ref 3.5–5.2)
Alkaline Phosphatase: 83 U/L (ref 39–117)
Bilirubin, Direct: 0.2 mg/dL (ref 0.0–0.3)
Total Bilirubin: 1.6 mg/dL — ABNORMAL HIGH (ref 0.3–1.2)
Total Protein: 7.7 g/dL (ref 6.0–8.3)

## 2011-11-24 ENCOUNTER — Other Ambulatory Visit: Payer: Self-pay

## 2011-11-24 DIAGNOSIS — E871 Hypo-osmolality and hyponatremia: Secondary | ICD-10-CM

## 2011-12-08 ENCOUNTER — Other Ambulatory Visit: Payer: Self-pay | Admitting: Cardiovascular Disease

## 2011-12-28 ENCOUNTER — Other Ambulatory Visit (INDEPENDENT_AMBULATORY_CARE_PROVIDER_SITE_OTHER): Payer: Managed Care, Other (non HMO)

## 2011-12-28 DIAGNOSIS — E871 Hypo-osmolality and hyponatremia: Secondary | ICD-10-CM

## 2011-12-28 LAB — BASIC METABOLIC PANEL
BUN: 11 mg/dL (ref 6–23)
CO2: 26 mEq/L (ref 19–32)
Calcium: 9.3 mg/dL (ref 8.4–10.5)
Chloride: 93 mEq/L — ABNORMAL LOW (ref 96–112)
Creatinine, Ser: 0.9 mg/dL (ref 0.4–1.5)
GFR: 92.82 mL/min (ref >60.00)
Glucose, Bld: 109 mg/dL — ABNORMAL HIGH (ref 70–99)
Potassium: 4.7 mEq/L (ref 3.5–5.1)
Sodium: 129 mEq/L — ABNORMAL LOW (ref 135–145)

## 2011-12-29 ENCOUNTER — Telehealth: Payer: Self-pay | Admitting: *Deleted

## 2011-12-29 NOTE — Telephone Encounter (Signed)
Message copied by Iona Hansen on Wed Dec 29, 2011  4:03 PM ------      Message from: Burtis Junes      Created: Tue Dec 28, 2011  4:55 PM       Ok to report. Sodium is still a little low. Needs to cut back on his free water intake. Let's recheck when I see him next month.

## 2011-12-30 ENCOUNTER — Other Ambulatory Visit: Payer: Self-pay | Admitting: Cardiovascular Disease

## 2011-12-30 ENCOUNTER — Telehealth: Payer: Self-pay | Admitting: *Deleted

## 2011-12-30 NOTE — Telephone Encounter (Signed)
Message copied by Iona Hansen on Thu Dec 30, 2011 10:13 AM ------      Message from: [name]      Created: Tue Dec 28, 2011  4:55 PM       Ok to report. Sodium is still a little low. Needs to cut back on his free water intake. Let's recheck when I see him next month.

## 2011-12-30 NOTE — Telephone Encounter (Signed)
Notes Recorded by Iona Hansen, CMA on 12/30/2011 at 10:11 AM Discussed lab results with patient. Discussed him limiting his free water intake and the patient stated that he had done this but he eats a lot of melon. He would cut back on this due to the water content and see if this helps. Concepcion Living, CMA

## 2012-01-06 ENCOUNTER — Encounter: Payer: Self-pay | Admitting: Neurosurgery

## 2012-01-07 ENCOUNTER — Ambulatory Visit (INDEPENDENT_AMBULATORY_CARE_PROVIDER_SITE_OTHER): Payer: Managed Care, Other (non HMO) | Admitting: Neurosurgery

## 2012-01-07 ENCOUNTER — Ambulatory Visit (INDEPENDENT_AMBULATORY_CARE_PROVIDER_SITE_OTHER): Payer: Managed Care, Other (non HMO) | Admitting: Vascular Surgery

## 2012-01-07 ENCOUNTER — Encounter: Payer: Self-pay | Admitting: Neurosurgery

## 2012-01-07 VITALS — BP 186/90 | HR 63 | Resp 18 | Ht 71.0 in | Wt 208.7 lb

## 2012-01-07 DIAGNOSIS — I739 Peripheral vascular disease, unspecified: Secondary | ICD-10-CM

## 2012-01-07 DIAGNOSIS — Z48812 Encounter for surgical aftercare following surgery on the circulatory system: Secondary | ICD-10-CM

## 2012-01-07 DIAGNOSIS — I70219 Atherosclerosis of native arteries of extremities with intermittent claudication, unspecified extremity: Secondary | ICD-10-CM

## 2012-01-07 NOTE — Progress Notes (Signed)
[facility] [place] PAD/PVD Office Note  CC: Six-month PVD surveillance Referring Physician: Bridgett Larsson  History of Present Illness: 61 year old outpatient Dr. Bridgett Larsson who is status post left CIA and EIA PTA 2007. The patient denies rest pain or claudication with ambulation. Patient has no open ulcerations or lower extremities. Patient complains of no back pain or abdominal pain and denies any signs or symptoms of stroke.  Past Medical History  Diagnosis Date  . Hypertension   . Hyperlipidemia   . Coronary artery disease   . AAA (abdominal aortic aneurysm)   . PAD (peripheral artery disease)   . COPD (chronic obstructive pulmonary disease)   . Raynaud's disease /phenomenon   . Lumbar degenerative disc disease   . Stroke 10/15/2005    ROS: [x]  Positive   [ ]  Denies    General: [ ]  Weight loss, [ ]  Fever, [ ]  chills Neurologic: [ ]  Dizziness, [ ]  Blackouts, [ ]  Seizure [ ]  Stroke, [ ]  "Mini stroke", [ ]  Slurred speech, [ ]  Temporary blindness; [ ]  weakness in arms or legs, [ ]  Hoarseness Cardiac: [ ]  Chest pain/pressure, [ ]  Shortness of breath at rest [ ]  Shortness of breath with exertion, [ ]  Atrial fibrillation or irregular heartbeat Vascular: [ ]  Pain in legs with walking, [ ]  Pain in legs at rest, [ ]  Pain in legs at night,  [ ]  Non-healing ulcer, [ ]  Blood clot in vein/DVT,   Pulmonary: [ ]  Home oxygen, [ ]  Productive cough, [ ]  Coughing up blood, [ ]  Asthma,  [ ]  Wheezing Musculoskeletal:  [ ]  Arthritis, [ ]  Low back pain, [ ]  Joint pain Hematologic: [ ]  Easy Bruising, [ ]  Anemia; [ ]  Hepatitis Gastrointestinal: [ ]  Blood in stool, [ ]  Gastroesophageal Reflux/heartburn, [ ]  Trouble swallowing Urinary: [ ]  chronic Kidney disease, [ ]  on HD - [ ]  MWF or [ ]  TTHS, [ ]  Burning with urination, [ ]  Difficulty urinating Skin: [ ]  Rashes, [ ]  Wounds Psychological: [ ]  Anxiety, [ ]  Depression   Social History History  Substance Use Topics  . Smoking status: Former  Smoker    Types: Cigarettes    Quit date: 05/24/2005  . Smokeless tobacco: Never Used  . Alcohol Use: Yes     3-4  beers a day    Family History Family History  Problem Relation Age of Onset  . Coronary artery disease Father 106    HTN, Hyperlipidemia died  . Heart disease Father     Heart Disease before age 28  . Hypertension Father   . Hyperlipidemia Father   . Peripheral vascular disease Father   . Other Mother 68     died rheumatoid arthritis  . Dementia Mother   . Heart disease Brother   . Hypertension Brother   . Hyperlipidemia Brother     Allergies  Allergen Reactions  . Pletal (Cilostazol) Diarrhea    Current Outpatient Prescriptions  Medication Sig Dispense Refill  . amLODipine (NORVASC) 2.5 MG tablet take 1 tablet by mouth once daily  30 tablet  11  . aspirin 81 MG tablet Take 81 mg by mouth daily.        . carvedilol (COREG) 25 MG tablet take 1 and 1/2 tablet by mouth twice a day  90 tablet  9  . clopidogrel (PLAVIX) 75 MG tablet Take 1 tablet (75 mg total) by mouth daily.  30 tablet  6  . CRESTOR 20 MG tablet take 1  tablet by mouth once daily  30 tablet  PRN  . folic acid (FOLVITE) 1 MG tablet take 1 tablet by mouth once daily  30 tablet  6  . lisinopril (PRINIVIL,ZESTRIL) 20 MG tablet take 1 tablet by mouth twice a day  60 tablet  6  . nitroGLYCERIN (NITROSTAT) 0.4 MG SL tablet Place 1 tablet (0.4 mg total) under the tongue every 5 (five) minutes as needed.  25 tablet  6  . ranitidine (ZANTAC) 300 MG tablet take 1 tablet by mouth once daily  30 tablet  11  . Thiamine HCl (VITAMIN B-1) 100 MG tablet Take 100 mg by mouth daily.        . cilostazol (PLETAL) 100 MG tablet Take 1 tablet (100 mg total) by mouth 2 (two) times daily before a meal.  60 tablet  11    Physical Examination  Filed Vitals:   01/07/12 1341  BP: 186/90  Pulse: 63  Resp: 18    Body mass index is 29.11 kg/(m^2).  General:  WDWN in NAD Gait: Normal HEENT: WNL Eyes: Pupils  equal Pulmonary: normal non-labored breathing , without Rales, rhonchi,  wheezing Cardiac: RRR, without  Murmurs, rubs or gallops; No carotid bruits Abdomen: soft, NT, no masses Skin: no rashes, ulcers noted Vascular Exam/Pulses: 2+ radial pulses bilaterally, PT and DP pulses are not palpable, femoral pulses are palpable bilaterally  Extremities without ischemic changes, no Gangrene , no cellulitis; no open wounds;  Musculoskeletal: no muscle wasting or atrophy  Neurologic: A&O X 3; Appropriate Affect ; SENSATION: normal; MOTOR FUNCTION:  moving all extremities equally. Speech is fluent/normal  Non-Invasive Vascular Imaging: ABIs today are 0.50 and monophasic on the right, 1.07 and triphasic on the left which is virtually unchanged from previous exam when reviewing that report   ASSESSMENT/PLAN: Patient with known claudication right leg has no complaints of claudication or rest pain today, the patient will followup in October for AAA duplex in the next sring for carotid and ABIs. The patient's in agreement with this plan, his questions were encouraged and answered.  Beatris Ship ANP  Clinic M.D.: Bridgett Larsson

## 2012-01-07 NOTE — Progress Notes (Signed)
Ankle brachial index performed @ [facility] 01/07/2012

## 2012-01-25 ENCOUNTER — Encounter: Payer: Self-pay | Admitting: Nurse Practitioner

## 2012-01-25 ENCOUNTER — Ambulatory Visit (INDEPENDENT_AMBULATORY_CARE_PROVIDER_SITE_OTHER): Payer: Managed Care, Other (non HMO) | Admitting: Nurse Practitioner

## 2012-01-25 ENCOUNTER — Telehealth: Payer: Self-pay | Admitting: *Deleted

## 2012-01-25 VITALS — BP 134/70 | HR 67 | Ht 71.0 in | Wt 213.4 lb

## 2012-01-25 DIAGNOSIS — I10 Essential (primary) hypertension: Secondary | ICD-10-CM

## 2012-01-25 LAB — BASIC METABOLIC PANEL
BUN: 8 mg/dL (ref 6–23)
CO2: 26 mEq/L (ref 19–32)
Calcium: 9.3 mg/dL (ref 8.4–10.5)
Chloride: 95 mEq/L — ABNORMAL LOW (ref 96–112)
Creatinine, Ser: 0.9 mg/dL (ref 0.4–1.5)
GFR: 89.28 mL/min (ref >60.00)
Glucose, Bld: 91 mg/dL (ref 70–99)
Potassium: 4.7 mEq/L (ref 3.5–5.1)
Sodium: 130 mEq/L — ABNORMAL LOW (ref 135–145)

## 2012-01-25 NOTE — Progress Notes (Signed)
[name] Date of Birth: [birth year] Medical Record [id number]  History of Present Illness: [name] is seen back today for a 3 month check. He is seen for Dr. Burt Knack. He has an extensive history of PVD and CAD. He is followed by Dr. Bridgett Larsson at [facility]. He has had remote cath in 2006 showing normal LV function with a totally occluded RCA with left to right collaterals. He has distal small vessel disease in the RCA with diffuse mild to moderate stenosis in the LAD. Has never had a MI. No longer smoking. His PVD history is quite extensive. Intolerant to Pletal due to diarrhea. Other problems include HTN, HLD and OA.   He comes in today. He is here with his wife. He is doing well. Has had some hyponatremia noted on past labs. Was instructed in fluid restrictions. He has some limitation with his legs due to claudication. Not any worse however. No chest pain. Not short of breath. Not very active. BP is good at home. He feels good with his current regimen. Needs a repeat BMET today.   Current Outpatient Prescriptions on File Prior to Visit  Medication Sig Dispense Refill  . amLODipine (NORVASC) 2.5 MG tablet take 1 tablet by mouth once daily  30 tablet  11  . aspirin 81 MG tablet Take 81 mg by mouth daily.        . carvedilol (COREG) 25 MG tablet take 1 and 1/2 tablet by mouth twice a day  90 tablet  9  . cilostazol (PLETAL) 100 MG tablet Take 1 tablet (100 mg total) by mouth 2 (two) times daily before a meal.  60 tablet  11  . clopidogrel (PLAVIX) 75 MG tablet Take 1 tablet (75 mg total) by mouth daily.  30 tablet  6  . CRESTOR 20 MG tablet take 1 tablet by mouth once daily  30 tablet  PRN  . folic acid (FOLVITE) 1 MG tablet take 1 tablet by mouth once daily  30 tablet  6  . lisinopril (PRINIVIL,ZESTRIL) 20 MG tablet take 1 tablet by mouth twice a day  60 tablet  6  . nitroGLYCERIN (NITROSTAT) 0.4 MG SL tablet Place 1 tablet (0.4 mg total) under the tongue every 5 (five) minutes as needed.  25 tablet  6  .  ranitidine (ZANTAC) 300 MG tablet take 1 tablet by mouth once daily  30 tablet  11  . Thiamine HCl (VITAMIN B-1) 100 MG tablet Take 100 mg by mouth daily.          Allergies  Allergen Reactions  . Pletal (Cilostazol) Diarrhea    Past Medical History  Diagnosis Date  . Hypertension   . Hyperlipidemia   . Coronary artery disease   . AAA (abdominal aortic aneurysm)   . PAD (peripheral artery disease)   . COPD (chronic obstructive pulmonary disease)   . Raynaud's disease /phenomenon   . Lumbar degenerative disc disease   . Stroke 10/15/2005    Past Surgical History  Procedure Date  . Iliac artery stent 10/15/2005     Left common and external   . Cardiac catheterization 2006  . Carotid endarterectomy     bilateral  . Carotid endarterectomy 04/13/2005    Right and 06/03/2005 Left    History  Smoking status  . Former Smoker  . Types: Cigarettes  . Quit date: 05/24/2005  Smokeless tobacco  . Never Used    History  Alcohol Use  . Yes    3-4  beers  a day    Family History  Problem Relation Age of Onset  . Coronary artery disease Father 6    HTN, Hyperlipidemia died  . Heart disease Father     Heart Disease before age 2  . Hypertension Father   . Hyperlipidemia Father   . Peripheral vascular disease Father   . Other Mother 77     died rheumatoid arthritis  . Dementia Mother   . Heart disease Brother   . Hypertension Brother   . Hyperlipidemia Brother     Review of Systems: The review of systems is per the HPI.  All other systems were reviewed and are negative.  Physical Exam: BP 134/70  Pulse 67  Ht 5\' 11"  (1.803 m)  Wt 213 lb 6.4 oz (96.798 kg)  BMI 29.76 kg/m2 Patient is very pleasant and in no acute distress. Skin is warm and dry. Color is normal.  HEENT is unremarkable. Normocephalic/atraumatic. PERRL. Sclera are nonicteric. Neck is supple. No masses. No JVD. Lungs are clear. Cardiac exam shows a regular rate and rhythm. Abdomen is soft. Extremities  are without edema. Gait and ROM are intact. No gross neurologic deficits noted.  LABORATORY DATA: BMET is pending.  Lab Results  Component Value Date   WBC 6.9 06/22/2011   HGB 14.4 06/22/2011   HCT 41.5 06/22/2011   PLT 205.0 06/22/2011   GLUCOSE 109* 12/28/2011   CHOL 135 10/28/2011   TRIG 69.0 10/28/2011   HDL 65.70 10/28/2011   LDLCALC 56 10/28/2011   ALT 14 11/22/2011   AST 24 11/22/2011   NA 129* 12/28/2011   K 4.7 12/28/2011   CL 93* 12/28/2011   CREATININE 0.9 12/28/2011   BUN 11 12/28/2011   CO2 26 12/28/2011     Assessment / Plan: 1. HTN - blood pressure looks good. No change in his current regimen. Will check a BMET today.   2. Hyponatremia - will recheck BMET today. He says he has restricted his water.   3. CAD - No chest pain.   4. HLD - on statin therapy. Will recheck labs in December.   We will continue with his current regimen. Check labs today. I will have him see Dr. Burt Knack in December. Patient is agreeable to this plan and will call if any problems develop in the interim.

## 2012-01-25 NOTE — Telephone Encounter (Signed)
Pt notified of lab results.  ADB

## 2012-01-25 NOTE — Patient Instructions (Addendum)
We need to check your labs today.  Dr. Burt Knack will see you in December with fasting labs.  Try to stay active.  Continue with your same medicines.  Call the [facility] office at [phone] if you have any questions, problems or concerns.

## 2012-01-25 NOTE — Telephone Encounter (Signed)
Message copied by Iona Hansen on Tue Jan 25, 2012  1:44 PM ------      Message from: Burtis Junes      Created: Tue Jan 25, 2012  1:32 PM       Ok to report. Labs are satisfactory. Sodium is a little better. Will follow for now. No change in medicines.

## 2012-01-29 ENCOUNTER — Other Ambulatory Visit: Payer: Self-pay | Admitting: Cardiovascular Disease

## 2012-03-09 ENCOUNTER — Encounter: Payer: Self-pay | Admitting: Vascular Surgery

## 2012-03-10 ENCOUNTER — Encounter: Payer: Self-pay | Admitting: Vascular Surgery

## 2012-03-10 ENCOUNTER — Encounter (INDEPENDENT_AMBULATORY_CARE_PROVIDER_SITE_OTHER): Payer: Managed Care, Other (non HMO) | Admitting: *Deleted

## 2012-03-10 ENCOUNTER — Ambulatory Visit (INDEPENDENT_AMBULATORY_CARE_PROVIDER_SITE_OTHER): Payer: Managed Care, Other (non HMO) | Admitting: Vascular Surgery

## 2012-03-10 VITALS — BP 129/77 | HR 70 | Resp 16 | Ht 71.0 in | Wt 211.0 lb

## 2012-03-10 DIAGNOSIS — I739 Peripheral vascular disease, unspecified: Secondary | ICD-10-CM

## 2012-03-10 DIAGNOSIS — I714 Abdominal aortic aneurysm, without rupture: Secondary | ICD-10-CM

## 2012-03-10 DIAGNOSIS — Z48812 Encounter for surgical aftercare following surgery on the circulatory system: Secondary | ICD-10-CM

## 2012-03-10 NOTE — Progress Notes (Signed)
[facility]   Established Abdominal Aortic Aneurysm  History of Present Illness  The patient is a 61 y.o. (20-Jan-1951) male who presents with chief complaint: follow up for AAA.  Previous studies demonstrate an AAA, measuring 4.35 cm.  The patient does not have back or abdominal pain.  The patient is not currently smoking.  He notes some intermittent claudication sx and right hip sx.  He is no longer taking his Pletal due to diarrhea.  Also, the patient has had no TIA or CVA sx recently.  Pt has prior history of LLE stenting and angioplasty by Dr. Amedeo Plenty  Past Medical History  Diagnosis Date  . Hypertension   . Hyperlipidemia   . Coronary artery disease   . AAA (abdominal aortic aneurysm)   . PAD (peripheral artery disease)   . COPD (chronic obstructive pulmonary disease)   . Raynaud's disease /phenomenon   . Lumbar degenerative disc disease   . Stroke 10/15/2005    Past Surgical History  Procedure Date  . Iliac artery stent 10/15/2005     Left common and external   . Cardiac catheterization 2006  . Carotid endarterectomy     bilateral  . Carotid endarterectomy 04/13/2005    Right and 06/03/2005 Left    History   Social History  . Marital Status: Married    Spouse Name: N/A    Number of Children: N/A  . Years of Education: N/A   Occupational History  . MANAGER. owns [employer]    Social History Main Topics  . Smoking status: Former Smoker    Types: Cigarettes    Quit date: 05/24/2005  . Smokeless tobacco: Never Used  . Alcohol Use: 14.4 oz/week    24 Cans of beer per week  . Drug Use: No  . Sexually Active: Not on file   Other Topics Concern  . Not on file   Social History Narrative  . No narrative on file    Family History  Problem Relation Age of Onset  . Coronary artery disease Father 80    HTN, Hyperlipidemia died  . Heart disease Father     Heart Disease before age 58  . Hypertension Father   . Hyperlipidemia Father   .  Peripheral vascular disease Father   . Other Mother 47     died rheumatoid arthritis  . Dementia Mother   . Heart disease Brother   . Hypertension Brother   . Hyperlipidemia Brother     Current Outpatient Prescriptions on File Prior to Visit  Medication Sig Dispense Refill  . amLODipine (NORVASC) 2.5 MG tablet take 1 tablet by mouth once daily  30 tablet  11  . aspirin 81 MG tablet Take 81 mg by mouth daily.        . carvedilol (COREG) 25 MG tablet take 1 and 1/2 tablet by mouth twice a day  90 tablet  9  . clopidogrel (PLAVIX) 75 MG tablet Take 1 tablet (75 mg total) by mouth daily.  30 tablet  6  . CRESTOR 20 MG tablet take 1 tablet by mouth once daily  30 tablet  PRN  . folic acid (FOLVITE) 1 MG tablet take 1 tablet by mouth once daily  30 tablet  6  . lisinopril (PRINIVIL,ZESTRIL) 20 MG tablet take 1 tablet by mouth twice a day  60 tablet  6  . nitroGLYCERIN (NITROSTAT) 0.4 MG SL tablet Place 1 tablet (0.4 mg total) under the tongue every 5 (five) minutes  as needed.  25 tablet  6  . ranitidine (ZANTAC) 300 MG tablet take 1 tablet by mouth once daily  30 tablet  11  . Thiamine HCl (VITAMIN B-1) 100 MG tablet Take 100 mg by mouth daily.        . cilostazol (PLETAL) 100 MG tablet Take 1 tablet (100 mg total) by mouth 2 (two) times daily before a meal.  60 tablet  11    Allergies  Allergen Reactions  . Pletal (Cilostazol) Diarrhea    Review of Systems (Positive items checked otherwise negative)  General: [ ]  Weight loss, [ ]  Weight gain, [ ]   Loss of appetite, [ ]  Fever  Neurologic: [ ]  Dizziness, [ ]  Blackouts, [ ]  Headaches, [ ]  Seizure  Ear/Nose/Throat: [ ]  Change in eyesight, [ ]  Change in hearing, [ ]  Nose bleeds, [ ]  Sore throat  Vascular: [x]  Pain in legs with walking, [ ]  Pain in feet while lying flat, [ ]  Non-healing ulcer, Stroke, [ ]  "Mini stroke", [ ]  Slurred speech, [ ]  Temporary blindness, [ ]  Blood clot in vein, [ ]  Phlebitis  Pulmonary: [ ]  Home oxygen, [ ]   Productive cough, [ ]  Bronchitis, [ ]  Coughing up blood, [ ]  Asthma, [ ]  Wheezing  Musculoskeletal: [ ]  Arthritis, [ ]  Joint pain, [ ]  Muscle pain  Cardiac: [ ]  Chest pain, [ ]  Chest tightness/pressure, [ ]  Shortness of breath when lying flat, [ ]  Shortness of breath with exertion, [ ]  Palpitations, [ ]  Heart murmur, [ ]  Arrythmia,  [ ]  Atrial fibrillation  Hematologic: [x]  Bleeding problems, [ ]  Clotting disorder, [ ]  Anemia  Psychiatric:  [ ]  Depression, [ ]  Anxiety, [ ]  Attention deficit disorder  Gastrointestinal:  [ ]  Black stool,[ ]   Blood in stool, [ ]  Peptic ulcer disease, [ ]  Reflux, [ ]  Hiatal hernia, [ ]  Trouble swallowing, [ ]  Diarrhea, [ ]  Constipation  Urinary:  [ ]  Kidney disease, [ ]  Burning with urination, [ ]  Frequent urination, [ ]  Difficulty urinating  Skin: [ ]  Ulcers, [ ]  Rashes  Physical Examination  Filed Vitals:   03/10/12 0941  BP: 129/77  Pulse: 70  Resp: 16  Height: 5\' 11"  (1.803 m)  Weight: 211 lb (95.709 kg)  SpO2: 98%   Body mass index is 29.43 kg/(m^2).  General: A&O x 3, WDWN  Pulmonary: Sym exp, good air movt, CTAB, no rales, rhonchi, & wheezing  Cardiac: RRR, Nl S1, S2, no Murmurs, rubs or gallops  Vascular: Vessel Right Left  Radial Palpable Palpable  Ulnar Palpable Palpable  Brachial Palpable Palpable  Carotid Palpable, without bruit Palpable, without bruit  Aorta Not palpable N/A  Femoral Palpable Palpable  Popliteal Not palpable Not palpable  PT Not Palpable Palpable  DP Not Palpable Palpable   Gastrointestinal: soft, NTND, -G/R, - HSM, - masses, - CVAT B  Musculoskeletal: M/S 5/5 throughout , Extremities without ischemic changes   Neurologic: CN 2-12 intact, Pain and light touch intact in extremities , Motor exam as listed above  Non-Invasive Vascular Imaging  AAA Duplex (Date: 03/10/12)  Previous size: 4.35 cm   Current size:  4.4 cm (transverse) x 4.6 cm (AP)  BLE ABI (DATE: 03/10/12)  RLE: 0.51, DP and AT:  monophasic  LLE: 1.07, DP and AT: biphasic  Medical Decision Making  The patient is a 61 y.o. male who presents with: slowly enlarging AAA, s/p B CEA, PAD w/ likely R CIA stenosis   Based on this  patient's exam and diagnostic studies, he needs annual AAA duplex and B carotid duplex.  The threshold for repair is AAA size > 5.5 cm, growth > 1 cm/yr, and symptomatic status.  In regards to PAD, I recommend proceeding with aortogram, RLE angiogram, possible intervention on R CIA to improve blood flow to R foot and preserve endograft option in this patient's case. I discussed with the patient the nature of angiographic procedures, especially the limited patencies of any endovascular intervention.  The patient is aware of that the risks of an angiographic procedure include but are not limited to: bleeding, infection, access site complications, renal failure, embolization, rupture of vessel, dissection, possible need for emergent surgical intervention, possible need for surgical procedures to treat the patient's pathology, and stroke and death.   The patient is aware of the risks and agrees to proceed. The procedure is scheduled for 8 NOV 13.  I emphasized the importance of maximal medical management including strict control of blood pressure, blood glucose, and lipid levels, antiplatelet agents, obtaining regular exercise, and cessation of smoking.    Thank you for allowing [place] to participate in this patient's care.  Adele Barthel, MD [facility] [place] Office: [phone] Pager: [phone]  03/10/2012, 10:13 AM

## 2012-03-24 ENCOUNTER — Encounter (HOSPITAL_COMMUNITY): Payer: Self-pay | Admitting: Pharmacy Technician

## 2012-03-28 ENCOUNTER — Other Ambulatory Visit: Payer: Self-pay

## 2012-03-30 ENCOUNTER — Encounter (HOSPITAL_COMMUNITY)
Admission: RE | Disposition: A | Discharge status: Home / Self Care | Payer: Self-pay | Source: Ambulatory Visit | Attending: Vascular Surgery

## 2012-03-30 ENCOUNTER — Other Ambulatory Visit: Payer: Self-pay | Admitting: *Deleted

## 2012-03-30 ENCOUNTER — Ambulatory Visit (HOSPITAL_COMMUNITY)
Admission: RE | Admit: 2012-03-30 | Discharge: 2012-03-30 | Disposition: A | Discharge status: Home / Self Care | Payer: Managed Care, Other (non HMO) | Source: Ambulatory Visit | Attending: Vascular Surgery | Admitting: Vascular Surgery

## 2012-03-30 DIAGNOSIS — E785 Hyperlipidemia, unspecified: Secondary | ICD-10-CM | POA: Insufficient documentation

## 2012-03-30 DIAGNOSIS — J449 Chronic obstructive pulmonary disease, unspecified: Secondary | ICD-10-CM | POA: Insufficient documentation

## 2012-03-30 DIAGNOSIS — I10 Essential (primary) hypertension: Secondary | ICD-10-CM | POA: Insufficient documentation

## 2012-03-30 DIAGNOSIS — I714 Abdominal aortic aneurysm, without rupture, unspecified: Secondary | ICD-10-CM | POA: Insufficient documentation

## 2012-03-30 DIAGNOSIS — I70219 Atherosclerosis of native arteries of extremities with intermittent claudication, unspecified extremity: Secondary | ICD-10-CM

## 2012-03-30 DIAGNOSIS — I712 Thoracic aortic aneurysm, without rupture: Secondary | ICD-10-CM

## 2012-03-30 DIAGNOSIS — I2510 Atherosclerotic heart disease of native coronary artery without angina pectoris: Secondary | ICD-10-CM | POA: Insufficient documentation

## 2012-03-30 DIAGNOSIS — Z8673 Personal history of transient ischemic attack (TIA), and cerebral infarction without residual deficits: Secondary | ICD-10-CM | POA: Insufficient documentation

## 2012-03-30 DIAGNOSIS — J4489 Other specified chronic obstructive pulmonary disease: Secondary | ICD-10-CM | POA: Insufficient documentation

## 2012-03-30 HISTORY — PX: ABDOMINAL AORTAGRAM: SHX5454

## 2012-03-30 HISTORY — PX: ABDOMINAL ANGIOGRAM: SHX5705

## 2012-03-30 LAB — POCT I-STAT, CHEM 8
Calcium, Ion: 1.2 mmol/L (ref 1.13–1.30)
Glucose, Bld: 97 mg/dL (ref 70–99)
HCT: 37 % — ABNORMAL LOW (ref 39.0–52.0)
Hemoglobin: 12.6 g/dL — ABNORMAL LOW (ref 13.0–17.0)

## 2012-03-30 SURGERY — ABDOMINAL AORTAGRAM
Anesthesia: LOCAL

## 2012-03-30 MED ORDER — OXYCODONE-ACETAMINOPHEN 5-325 MG PO TABS
ORAL_TABLET | ORAL | Status: AC
  Filled 2012-03-30: qty 2

## 2012-03-30 MED ORDER — FENTANYL CITRATE 0.05 MG/ML IJ SOLN
INTRAMUSCULAR | Status: AC
  Filled 2012-03-30: qty 2

## 2012-03-30 MED ORDER — SODIUM CHLORIDE 0.9 % IV SOLN
INTRAVENOUS | Status: DC
  Administered 2012-03-30: 07:00:00 via INTRAVENOUS

## 2012-03-30 MED ORDER — ACETAMINOPHEN 325 MG PO TABS: 650.0000 mg | ORAL_TABLET | ORAL | Status: DC | PRN

## 2012-03-30 MED ORDER — LIDOCAINE HCL (PF) 1 % IJ SOLN
INTRAMUSCULAR | Status: AC
  Filled 2012-03-30: qty 30

## 2012-03-30 MED ORDER — ONDANSETRON HCL 4 MG/2ML IJ SOLN: 4.0000 mg | Freq: Four times a day (QID) | INTRAMUSCULAR | Status: DC | PRN

## 2012-03-30 MED ORDER — HEPARIN (PORCINE) IN NACL 2-0.9 UNIT/ML-% IJ SOLN
INTRAMUSCULAR | Status: AC
  Filled 2012-03-30: qty 1000

## 2012-03-30 MED ORDER — OXYCODONE-ACETAMINOPHEN 5-325 MG PO TABS
2.0000 | ORAL_TABLET | ORAL | Status: DC | PRN
  Administered 2012-03-30: 2 via ORAL

## 2012-03-30 MED ORDER — SODIUM CHLORIDE 0.9 % IV SOLN: 1.0000 mL/kg/h | INTRAVENOUS | Status: DC

## 2012-03-30 MED ORDER — MIDAZOLAM HCL 2 MG/2ML IJ SOLN
INTRAMUSCULAR | Status: AC
  Filled 2012-03-30: qty 2

## 2012-03-30 NOTE — Op Note (Signed)
OPERATIVE NOTE   PROCEDURE: 1.  Bilateral common femoral artery cannulation under ultrasound guidance 2.  Aortogram 3.  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  Superior mesenteric artery: Patent Celiac artery: <30% offical stenosis  Right Left  RA Patent with 50-75% stenosis Patent with >75%  CIA Patent but diseased and calcified Patent stents x 2 (7.5 mm lumen)  EIA Occluded, distal reconstitutes via pelvic branches Patent but diseased and calcified  IIA Patent but diseased and calcified Patent but diseased and calcified  CFA Patent but diseased and calcified Patent but diseased and calcified  SFA Patent but diseased and calcified with <30% stenosis Patent but diseased and calcified  PFA Patent but diseased and calcified with 50-75% stenosis Patent but diseased and calcified  Pop Patent but diseased and calcified Patent but diseased and calcified  Trif Patent but diseased and calcified Patent but diseased and calcified  AT Patent but diseased and calcified, runoff to foot Patent but diseased and calcified, runoff to foot  Pero Patent but diseased and calcified Patent but diseased and calcified  PT Patent but diseased and calcified, runoff to foot Patent but diseased and calcified, runoff to foot   SPECIMEN(S):  none  INDICATIONS:   [name] is a 61 y.o. male who presents with bilateral intermittent claudication and possible right common iliac artery stenosis.  The patient presents for: aortogram, bilateral runoff, and possible intervention.  I discussed with the patient the nature of angiographic procedures, especially the limited patencies of any  endovascular intervention.  The patient is aware of that the risks of an angiographic procedure include but are not limited to: bleeding, infection, access site complications, renal failure, embolization, rupture of vessel, dissection, possible need for emergent surgical intervention, possible need for surgical procedures to treat the patient's pathology, and stroke and death.  The patient is aware of the risks and agrees to proceed.  DESCRIPTION: After full informed consent was obtained from the patient, the patient was brought back to the angiography suite.  The patient was placed supine upon the angiography table and connected to monitoring equipment.  The patient was then given conscious sedation, the amounts of which are documented in the patient's chart.  The patient was prepped and drape in the standard fashion for an angiographic procedure.  At this point, attention was turned to the right groin.  Under ultrasound guidance, the right common femoral artery was cannulated with a micropuncture needle.  The microwire was advanced into the iliac arterial system.  The needle was exchanged for a microsheath, which was loaded into the common femoral artery over the wire.  The microwire was exchanged for a [facility] wire which was advanced into the external iliac artery.  The microsheath was then exchanged for a 5-Fr sheath which was loaded into the common femoral artery.  A hand injection demonstrated CTO of distal external iliac artery.  I placed a KMP catheter over the wire and the wire was exchanged for a Glidewire.  Using these two, I was not able cross the CTO.  Given the severe luminal compromise with calcification, I felt there was a risk of rupture with a subintimal technique.  I removed the wire and catheter.  The sheath was aspirated.  No clots were present and the sheath was reloaded with heparinized saline.  Under ultrasound guidance, the left common femoral artery was cannulated with a micropuncture  needle.  The microwire was advanced into the iliac arterial system.  The needle was exchanged for a microsheath, which was loaded into the common femoral artery over the wire.  The microwire was exchanged for a [facility] wire which was advanced into the distal common iliac artery.  The microsheath was then exchanged for a 5-Fr sheath which was loaded into the common femoral artery.  I did a hand injection which demonstrated patent Left common iliac artery stents.  I placed a KMP catheter over the wire and the wire was exchanged for a Glidewire.  Using these two, I was able to get into the suprarenal aorta.  The catheter was exchanged for an Omniflush catheter.  The catheter was connected to the power injector circuit.  After de-airring and de-clotting the circuit, a power injector aortogram was completed.   A lateral projection was also completed to image the mesenteric arteries.  I pulled the catheter down to just proximal to the aortic bifurcation.  An pelvic injection was completed.  I replaced the wire into the catheter, straightening out the crook in the catheter.  Both were removed from the sheath together.   I connected the right femoral sheath to the power injector circuit.  An automated right leg runoff was completed.  The sheath was aspirated.  No clots were present and the sheath was reloaded with heparinized saline.  I connected the left femoral sheath to the power injector circuit.  An automated left leg runoff was completed.  The sheath was aspirated.  No clots were present and the sheath was reloaded with heparinized saline.    Based upon the images, an endovascular intervention of the right external iliac artery CTO is not possible.  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  The abdominal aortic aneurysm will need an aortounilateral graft with femorofemoral bypass.  Additional CTA abdomen and pelvis will be necessary to determine if this is possible.    Subsequently treatment of the AAA and right external iliac artery CTO amy be managed together.  Additionally, his renal artery stenoses will likely need intervention in the future.  The patient will get the CTA and then return for further evaluation in the clinic.  COMPLICATIONS: none  CONDITION: stable   Adele Barthel, MD [facility] [place] Office: [phone] Pager: [phone]  03/30/2012, 9:07 AM

## 2012-03-30 NOTE — Interval H&P Note (Signed)
[facility] [place]  History and Physical Update  The patient was interviewed and re-examined.  The patient's previous History and Physical has been reviewed and is unchanged.  There is no change in the plan of care: aortogram, possible R iliac intervention.  I discussed with the patient the nature of angiographic procedures, especially the limited patencies of any endovascular intervention.  The patient is aware of that the risks of an angiographic procedure include but are not limited to: bleeding, infection, access site complications, renal failure, embolization, rupture of vessel, dissection, possible need for emergent surgical intervention, possible need for surgical procedures to treat the patient's pathology, and stroke and death.  The patient is aware of the risks and agrees to proceed.  Adele Barthel, MD [facility] [place] Office: [phone] Pager: [phone]  03/30/2012, 7:23 AM

## 2012-03-30 NOTE — Progress Notes (Signed)
1400 ambulatory to bathroom with out incident. No bleeding or hematoma noted to either groin.  Both medication discharge and groin care discharge instructions given to patient and spouse.  Both voice understanding

## 2012-03-30 NOTE — H&P (View-Only) (Signed)
[facility] [place]  Established Abdominal Aortic Aneurysm  History of Present Illness  The patient is a 61 y.o. (22-Feb-1951) male who presents with chief complaint: follow up for AAA.  Previous studies demonstrate an AAA, measuring 4.35 cm.  The patient does not have back or abdominal pain.  The patient is not currently smoking.  He notes some intermittent claudication sx and right hip sx.  He is no longer taking his Pletal due to diarrhea.  Also, the patient has had no TIA or CVA sx recently.  Pt has prior history of LLE stenting and angioplasty by Dr. Amedeo Plenty  Past Medical History  Diagnosis Date  . Hypertension   . Hyperlipidemia   . Coronary artery disease   . AAA (abdominal aortic aneurysm)   . PAD (peripheral artery disease)   . COPD (chronic obstructive pulmonary disease)   . Raynaud's disease /phenomenon   . Lumbar degenerative disc disease   . Stroke 10/15/2005    Past Surgical History  Procedure Date  . Iliac artery stent 10/15/2005     Left common and external   . Cardiac catheterization 2006  . Carotid endarterectomy     bilateral  . Carotid endarterectomy 04/13/2005    Right and 06/03/2005 Left    History   Social History  . Marital Status: Married    Spouse Name: N/A    Number of Children: N/A  . Years of Education: N/A   Occupational History  . MANAGER. owns [employer]    Social History Main Topics  . Smoking status: Former Smoker    Types: Cigarettes    Quit date: 05/24/2005  . Smokeless tobacco: Never Used  . Alcohol Use: 14.4 oz/week    24 Cans of beer per week  . Drug Use: No  . Sexually Active: Not on file   Other Topics Concern  . Not on file   Social History Narrative  . No narrative on file    Family History  Problem Relation Age of Onset  . Coronary artery disease Father 90    HTN, Hyperlipidemia died  . Heart disease Father     Heart Disease before age 74  . Hypertension Father   . Hyperlipidemia Father   .  Peripheral vascular disease Father   . Other Mother 33     died rheumatoid arthritis  . Dementia Mother   . Heart disease Brother   . Hypertension Brother   . Hyperlipidemia Brother     Current Outpatient Prescriptions on File Prior to Visit  Medication Sig Dispense Refill  . amLODipine (NORVASC) 2.5 MG tablet take 1 tablet by mouth once daily  30 tablet  11  . aspirin 81 MG tablet Take 81 mg by mouth daily.        . carvedilol (COREG) 25 MG tablet take 1 and 1/2 tablet by mouth twice a day  90 tablet  9  . clopidogrel (PLAVIX) 75 MG tablet Take 1 tablet (75 mg total) by mouth daily.  30 tablet  6  . CRESTOR 20 MG tablet take 1 tablet by mouth once daily  30 tablet  PRN  . folic acid (FOLVITE) 1 MG tablet take 1 tablet by mouth once daily  30 tablet  6  . lisinopril (PRINIVIL,ZESTRIL) 20 MG tablet take 1 tablet by mouth twice a day  60 tablet  6  . nitroGLYCERIN (NITROSTAT) 0.4 MG SL tablet Place 1 tablet (0.4 mg total) under the tongue every 5 (five) minutes  as needed.  25 tablet  6  . ranitidine (ZANTAC) 300 MG tablet take 1 tablet by mouth once daily  30 tablet  11  . Thiamine HCl (VITAMIN B-1) 100 MG tablet Take 100 mg by mouth daily.        . cilostazol (PLETAL) 100 MG tablet Take 1 tablet (100 mg total) by mouth 2 (two) times daily before a meal.  60 tablet  11    Allergies  Allergen Reactions  . Pletal (Cilostazol) Diarrhea    Review of Systems (Positive items checked otherwise negative)  General: [ ]  Weight loss, [ ]  Weight gain, [ ]   Loss of appetite, [ ]  Fever  Neurologic: [ ]  Dizziness, [ ]  Blackouts, [ ]  Headaches, [ ]  Seizure  Ear/Nose/Throat: [ ]  Change in eyesight, [ ]  Change in hearing, [ ]  Nose bleeds, [ ]  Sore throat  Vascular: [x]  Pain in legs with walking, [ ]  Pain in feet while lying flat, [ ]  Non-healing ulcer, Stroke, [ ]  "Mini stroke", [ ]  Slurred speech, [ ]  Temporary blindness, [ ]  Blood clot in vein, [ ]  Phlebitis  Pulmonary: [ ]  Home oxygen, [ ]   Productive cough, [ ]  Bronchitis, [ ]  Coughing up blood, [ ]  Asthma, [ ]  Wheezing  Musculoskeletal: [ ]  Arthritis, [ ]  Joint pain, [ ]  Muscle pain  Cardiac: [ ]  Chest pain, [ ]  Chest tightness/pressure, [ ]  Shortness of breath when lying flat, [ ]  Shortness of breath with exertion, [ ]  Palpitations, [ ]  Heart murmur, [ ]  Arrythmia,  [ ]  Atrial fibrillation  Hematologic: [x]  Bleeding problems, [ ]  Clotting disorder, [ ]  Anemia  Psychiatric:  [ ]  Depression, [ ]  Anxiety, [ ]  Attention deficit disorder  Gastrointestinal:  [ ]  Black stool,[ ]   Blood in stool, [ ]  Peptic ulcer disease, [ ]  Reflux, [ ]  Hiatal hernia, [ ]  Trouble swallowing, [ ]  Diarrhea, [ ]  Constipation  Urinary:  [ ]  Kidney disease, [ ]  Burning with urination, [ ]  Frequent urination, [ ]  Difficulty urinating  Skin: [ ]  Ulcers, [ ]  Rashes  Physical Examination  Filed Vitals:   03/10/12 0941  BP: 129/77  Pulse: 70  Resp: 16  Height: 5\' 11"  (1.803 m)  Weight: 211 lb (95.709 kg)  SpO2: 98%   Body mass index is 29.43 kg/(m^2).  General: A&O x 3, WDWN  Pulmonary: Sym exp, good air movt, CTAB, no rales, rhonchi, & wheezing  Cardiac: RRR, Nl S1, S2, no Murmurs, rubs or gallops  Vascular: Vessel Right Left  Radial Palpable Palpable  Ulnar Palpable Palpable  Brachial Palpable Palpable  Carotid Palpable, without bruit Palpable, without bruit  Aorta Not palpable N/A  Femoral Palpable Palpable  Popliteal Not palpable Not palpable  PT Not Palpable Palpable  DP Not Palpable Palpable   Gastrointestinal: soft, NTND, -G/R, - HSM, - masses, - CVAT B  Musculoskeletal: M/S 5/5 throughout , Extremities without ischemic changes   Neurologic: CN 2-12 intact, Pain and light touch intact in extremities , Motor exam as listed above  Non-Invasive Vascular Imaging  AAA Duplex (Date: 03/10/12)  Previous size: 4.35 cm   Current size:  4.4 cm (transverse) x 4.6 cm (AP)  BLE ABI (DATE: 03/10/12)  RLE: 0.51, DP and AT:  monophasic  LLE: 1.07, DP and AT: biphasic  Medical Decision Making  The patient is a 61 y.o. male who presents with: slowly enlarging AAA, s/p B CEA, PAD w/ likely R CIA stenosis   Based on this  patient's exam and diagnostic studies, he needs annual AAA duplex and B carotid duplex.  The threshold for repair is AAA size > 5.5 cm, growth > 1 cm/yr, and symptomatic status.  In regards to PAD, I recommend proceeding with aortogram, RLE angiogram, possible intervention on R CIA to improve blood flow to R foot and preserve endograft option in this patient's case. I discussed with the patient the nature of angiographic procedures, especially the limited patencies of any endovascular intervention.  The patient is aware of that the risks of an angiographic procedure include but are not limited to: bleeding, infection, access site complications, renal failure, embolization, rupture of vessel, dissection, possible need for emergent surgical intervention, possible need for surgical procedures to treat the patient's pathology, and stroke and death.   The patient is aware of the risks and agrees to proceed. The procedure is scheduled for 8 NOV 13.  I emphasized the importance of maximal medical management including strict control of blood pressure, blood glucose, and lipid levels, antiplatelet agents, obtaining regular exercise, and cessation of smoking.    Thank you for allowing [place] to participate in this patient's care.  Adele Barthel, MD [facility] [place] Office: [phone] Pager: [phone]  03/10/2012, 10:13 AM

## 2012-03-31 ENCOUNTER — Telehealth: Payer: Self-pay | Admitting: Vascular Surgery

## 2012-03-31 ENCOUNTER — Other Ambulatory Visit: Payer: Self-pay | Admitting: Cardiovascular Disease

## 2012-03-31 NOTE — Telephone Encounter (Signed)
Unable to sched CTA - pt still in hospital. Juliann Pulse will sched when pt comes out of hospital - kf

## 2012-03-31 NOTE — Telephone Encounter (Signed)
Message copied by Berniece Salines on Fri Mar 31, 2012  2:19 PM ------      Message from: Alfonso Patten      Created: Thu Mar 30, 2012  1:03 PM                   ----- Message -----         From: Conrad [place], MD         Sent: 03/30/2012  10:23 AM           To: Patrici Ranks, Alfonso Patten, RN            [name]      [id number]      May 25, 1950                  PROCEDURE:      1.  Bilateral common femoral artery cannulation under ultrasound guidance      2.  Aortogram      3.  Bilateral runoff            Follow-up: 4 weeks            Orders(s) for follow-up:       CTA chest/abd/pelvis for aortic aneurysm evaluation

## 2012-04-13 ENCOUNTER — Encounter: Payer: Self-pay | Admitting: Vascular Surgery

## 2012-04-14 ENCOUNTER — Telehealth: Payer: Self-pay | Admitting: Cardiovascular Disease

## 2012-04-14 ENCOUNTER — Ambulatory Visit
Admission: RE | Admit: 2012-04-14 | Discharge: 2012-04-14 | Disposition: A | Discharge status: Home / Self Care | Payer: Managed Care, Other (non HMO) | Source: Ambulatory Visit | Attending: Vascular Surgery | Admitting: Vascular Surgery

## 2012-04-14 ENCOUNTER — Encounter: Payer: Self-pay | Admitting: Vascular Surgery

## 2012-04-14 ENCOUNTER — Ambulatory Visit (INDEPENDENT_AMBULATORY_CARE_PROVIDER_SITE_OTHER): Payer: Managed Care, Other (non HMO) | Admitting: Vascular Surgery

## 2012-04-14 VITALS — BP 112/68 | HR 67 | Resp 16 | Ht 71.0 in | Wt 211.6 lb

## 2012-04-14 DIAGNOSIS — I712 Thoracic aortic aneurysm, without rupture, unspecified: Secondary | ICD-10-CM | POA: Insufficient documentation

## 2012-04-14 DIAGNOSIS — I714 Abdominal aortic aneurysm, without rupture: Secondary | ICD-10-CM

## 2012-04-14 DIAGNOSIS — Z01818 Encounter for other preprocedural examination: Secondary | ICD-10-CM

## 2012-04-14 MED ORDER — IOHEXOL 350 MG/ML SOLN
100.0000 mL | Freq: Once | INTRAVENOUS | Status: AC | PRN
  Administered 2012-04-14: 100 mL via INTRAVENOUS

## 2012-04-14 NOTE — Telephone Encounter (Signed)
Pt was seen in September by Truitt Merle and pt needs surgical clearance for aorta illiac evar and they need to know can he get clearance or dose he need to be seen again

## 2012-04-14 NOTE — Telephone Encounter (Signed)
I have reviewed the patient's chart. I saw him in January of this year. He was recently seen by Truitt Merle. He was not having any anginal symptoms at either of those visits. His recent 12-lead EKG is within normal limits. I think he can proceed with surgery at low risk of cardiac problems and he should not require any further preoperative risk stratification. We would be happy to see him in the hospital if any problems arise.  [name] 04/14/2012 6:20 PM

## 2012-04-14 NOTE — Progress Notes (Signed)
[facility] [place]  Established Abdominal Aortic Aneurysm and Peripheral Arterial Disease  History of Present Illness  The patient is a 61 y.o. (Dec 13, 1950) male s/p dx angiogram (03/30/12) who presents with chief complaint: follow up for AAA.  His dx angiogram demonstrated a porcelain aorta and calcified R EIA CTO not amendable to endovascular techniques.  The patient continues to have claudication sx with rest pain.   Previous studies demonstrate an AAA, measuring 4.35 cm.  The patient does not have back or abdominal pain.  The patient is not a smoker.  He had his CTA chest/abd/pelvis completed today.  The patient's PMH, PSH, SH, FamHx, Med, Allergies and ROS are unchanged from 03/30/12.  Physical Examination  Filed Vitals:   04/14/12 1252  BP: 112/68  Pulse: 67  Resp: 16  Height: 5\' 11"  (1.803 m)  Weight: 211 lb 9.6 oz (95.981 kg)  SpO2: 95%   Body mass index is 29.51 kg/(m^2).  General: A&O x 3, WDWN  Pulmonary: Sym exp, good air movt, CTAB, no rales, rhonchi, & wheezing  Cardiac: RRR, Nl S1, S2, no Murmurs, rubs or gallops  Vascular: Vessel Right Left  Radial Palpable Palpable  Ulnar Palpable Palpable  Brachial Palpable Palpable  Carotid Palpable, without bruit Palpable, without bruit  Aorta Not palpable N/A  Femoral Palpable Palpable  Popliteal Not palpable Not palpable  PT Not Palpable Palpable  DP Not Palpable Palpable   Gastrointestinal: soft, NTND, -G/R, - HSM, - masses, - CVAT B  Musculoskeletal: M/S 5/5 throughout , Extremities without ischemic changes   Neurologic: Pain and light touch intact in extremities , Motor exam as listed above  CTA Chest/abd/pelvis (Date: 04/14/12)  No evidence of thoracic aortic aneurysm.   Atherosclerotic changes are delineated above. At the minimum, significant narrowing at the origin of the left common carotid artery is suspected.  Abdominal aortic aneurysm with AP and transverse maximal diameters of  4.9 and 4.6 cm.   There are areas of narrowing in the right common and external iliac artery making access problematic.   Left common iliac artery is stented and patent.   There is a moderate area of narrowing at the origin of the left external iliac artery.   Medical Decision Making  The patient is a 61 y.o. male who presents with: asx AAA and R EIA occlusion, RLE short distance claudication.   Based on this patient's exam and diagnostic studies, he needs Aortouni-iliac EVAR with femorofemoral bypass.  With a porcelain aorta, repairing the AAA through an open surgical technique will be difficult if not impossible, a suprarenal if not a supraceliac clamp may be necessary, and endarterectomizing the neck to sew in an aortic graft would be expected.  My experience has been to that endarterectomizing porcelain aorta frequently fails and results in pseudoaneurysms a the proximal anastomosis.  While the patient does not have CLI currently, with the limited flow to the RLE on the angiogram.  I think the RLE is going to be at risk for acute ischemia, so I don't think delaying intervention is wise.    While a femorofemoral bypass could be completed without the EVAR, the data suggests he will need the EVAR within the next 3 years anyway.  An aortounilateral EVAR will require the femorofemoral bypass anyway, so I think it is reasonable to proceed with the EVAR WITH the fem-fem BPG  I will need to have my Lacinda Axon rep review the CTA to see if a Elberta Fortis can sneak through  the stents on the left side.   Meanwhile the patient will get preoperative cardiac optimization and risk stratification given his significant cardiac history.  I suspect this will take 2-3 weeks so we'll see the patient again in 3 weeks.  I emphasized the importance of maximal medical management including strict control of blood pressure, blood glucose, and lipid levels, antiplatelet agents, obtaining regular exercise, and cessation of  smoking.    Thank you for allowing [place] to participate in this patient's care.  Adele Barthel, MD [facility] [place] Office: [phone] Pager: [phone]  04/14/2012, 1:50 PM

## 2012-04-20 ENCOUNTER — Other Ambulatory Visit: Payer: Self-pay | Admitting: Cardiovascular Disease

## 2012-05-04 ENCOUNTER — Encounter: Payer: Self-pay | Admitting: Vascular Surgery

## 2012-05-05 ENCOUNTER — Encounter: Payer: Self-pay | Admitting: Vascular Surgery

## 2012-05-05 ENCOUNTER — Ambulatory Visit (INDEPENDENT_AMBULATORY_CARE_PROVIDER_SITE_OTHER): Payer: Managed Care, Other (non HMO) | Admitting: Vascular Surgery

## 2012-05-05 VITALS — BP 151/63 | HR 63 | Ht 71.0 in | Wt 212.5 lb

## 2012-05-05 DIAGNOSIS — I714 Abdominal aortic aneurysm, without rupture: Secondary | ICD-10-CM

## 2012-05-05 DIAGNOSIS — I739 Peripheral vascular disease, unspecified: Secondary | ICD-10-CM

## 2012-05-05 NOTE — Progress Notes (Addendum)
[facility] [place]  Established Intermittent Claudication  History of Present Illness  [name] is a 61 y.o. (1950-08-19) male who presents with chief complaint: AAA and intermittent claudication.  This patient has short distance claudication which he feels is causing deterioration in his quality of life.  By previous angiogram demonstrates a porcelian aorta and calcific CTO in R EIA that could not be crossed.  The patient's symptoms have not progressed.  The patient's treatment regimen currently included: maximal medical management and walking plan.  He has been cleared by Dr. Burt Knack to proceed with intervention.  Past Medical History  Diagnosis Date  . Hypertension   . Hyperlipidemia   . Coronary artery disease   . AAA (abdominal aortic aneurysm)   . PAD (peripheral artery disease)   . COPD (chronic obstructive pulmonary disease)   . Raynaud's disease /phenomenon   . Lumbar degenerative disc disease   . Stroke 10/15/2005    Past Surgical History  Procedure Date  . Iliac artery stent 10/15/2005     Left common and external   . Cardiac catheterization 2006  . Carotid endarterectomy     bilateral  . Carotid endarterectomy 04/13/2005    Right and 06/03/2005 Left  . Abdominal angiogram 03/30/2012    History   Social History  . Marital Status: Married    Spouse Name: N/A    Number of Children: N/A  . Years of Education: N/A   Occupational History  . MANAGER. owns [employer]    Social History Main Topics  . Smoking status: Former Smoker    Types: Cigarettes    Quit date: 05/24/2005  . Smokeless tobacco: Never Used  . Alcohol Use: 14.4 oz/week    24 Cans of beer per week  . Drug Use: No  . Sexually Active: Not on file   Other Topics Concern  . Not on file   Social History Narrative  . No narrative on file    Family History  Problem Relation Age of Onset  . Coronary artery disease Father 39    HTN, Hyperlipidemia died  . Heart disease  Father     Heart Disease before age 53  . Hypertension Father   . Hyperlipidemia Father   . Peripheral vascular disease Father   . Other Mother 13     died rheumatoid arthritis  . Dementia Mother   . Heart disease Brother   . Hypertension Brother   . Hyperlipidemia Brother     Current Outpatient Prescriptions on File Prior to Visit  Medication Sig Dispense Refill  . amLODipine (NORVASC) 2.5 MG tablet Take 2.5 mg by mouth daily.      Marland Kitchen aspirin 81 MG tablet Take 81 mg by mouth daily.        . carvedilol (COREG) 25 MG tablet Take 37.5 mg by mouth 2 (two) times daily with a meal.      . folic acid (FOLVITE) 1 MG tablet Take 1 mg by mouth daily.      Marland Kitchen lisinopril (PRINIVIL,ZESTRIL) 20 MG tablet Take 20 mg by mouth 2 (two) times daily.      Marland Kitchen lisinopril (PRINIVIL,ZESTRIL) 20 MG tablet take 1 tablet by mouth twice a day  60 tablet  6  . LORazepam (ATIVAN) 0.5 MG tablet Take 0.5 mg by mouth daily.      . nitroGLYCERIN (NITROSTAT) 0.4 MG SL tablet Place 1 tablet (0.4 mg total) under the tongue every 5 (five) minutes as needed.  25  tablet  6  . PLAVIX 75 MG tablet take 1 tablet by mouth once daily  30 each  9  . ranitidine (ZANTAC) 300 MG tablet Take 300 mg by mouth at bedtime.      . rosuvastatin (CRESTOR) 20 MG tablet Take 20 mg by mouth at bedtime.      . Thiamine HCl (VITAMIN B-1) 100 MG tablet Take 100 mg by mouth daily.          Allergies  Allergen Reactions  . Pletal (Cilostazol) Diarrhea    Review of Systems (Positive items checked otherwise negative)  General: [ ]  Weight loss, [ ]  Weight gain, [ ]   Loss of appetite, [ ]  Fever  Neurologic: [ ]  Dizziness, [ ]  Blackouts, [ ]  Headaches, [ ]  Seizure  Ear/Nose/Throat: [ ]  Change in eyesight, [ ]  Change in hearing, [ ]  Nose bleeds, [ ]  Sore throat  Vascular: [x]  Pain in legs with walking, [ ]  Pain in feet while lying flat, [ ]  Non-healing ulcer, Stroke, [ ]  "Mini stroke", [ ]  Slurred speech, [ ]  Temporary blindness, [ ]  Blood clot  in vein, [ ]  Phlebitis  Pulmonary: [ ]  Home oxygen, [ ]  Productive cough, [ ]  Bronchitis, [ ]  Coughing up blood, [ ]  Asthma, [ ]  Wheezing  Musculoskeletal: [ ]  Arthritis, [ ]  Joint pain, [ ]  Muscle pain  Cardiac: [ ]  Chest pain, [ ]  Chest tightness/pressure, [ ]  Shortness of breath when lying flat, [ ]  Shortness of breath with exertion, [ ]  Palpitations, [ ]  Heart murmur, [ ]  Arrythmia, [ ]  Atrial fibrillation  Hematologic: [ ]  Bleeding problems, [ ]  Clotting disorder, [ ]  Anemia  Psychiatric:  [ ]  Depression, [ ]  Anxiety, [ ]  Attention deficit disorder  Gastrointestinal:  [ ]  Black stool,[ ]   Blood in stool, [ ]  Peptic ulcer disease, [ ]  Reflux,[ ]  Hiatal hernia, [ ]  Trouble swallowing, [ ]  Diarrhea, [ ]  Constipation  Urinary:  [ ]  Kidney disease, [ ]  Burning with urination, [ ]  Frequent urination, [ ]  Difficulty urinating  Skin: [ ]  Ulcers, [ ]  Rashes  Physical Examination  Filed Vitals:   05/05/12 1125  BP: 151/63  Pulse: 63  Height: 5\' 11"  (1.803 m)  Weight: 212 lb 8 oz (96.389 kg)  SpO2: 97%   Body mass index is 29.64 kg/(m^2).  General: A&O x 3, WDWN, mildly obese  Pulmonary: Sym exp, good air movt, CTAB, no rales, rhonchi, & wheezing  Cardiac: RRR, Nl S1, S2, no Murmurs, rubs or gallops  Vascular: Vessel Right Left  Radial Palpable Palpable  Ulnar Palpable Palpable  Brachial Palpable Palpable  Carotid Palpable, without bruit Palpable, without bruit  Aorta Not palpable N/A  Femoral Palpable Palpable  Popliteal Not palpable Not palpable  PT Not Palpable Faintly Palpable  DP Not Palpable Faintly Palpable   Musculoskeletal: M/S 5/5 throughout , Extremities without ischemic changes   Neurologic: Pain and light touch intact in extremities , Motor exam as listed above  Medical Decision Making  [name] is a 61 y.o. male who presents with: R >> L leg lifestyle limiting intermittent claudication without evidence of critical limb ischemia, small AAA.  Based  on the patient's vascular studies and examination, I have offered the patient: EVAR with aortouniiliac Lacinda Axon Renu), left to right femorofemoral bypass, and possible B femoral endarterectomies. The patient is aware the risks of aortic surgery include but are not limited to: bleeding, need for transfusion, infection, death, stroke, paralysis, wound complications,  bowel injuries, impotence, bowel ischemia, and extended ventilation.  Overall, I cited a mortality rate of 1-2% and morbidity rate of 15%. The risk, benefits, and alternative for bypass operations were discussed with the patient.  The patient is aware the risks include but are not limited to: bleeding, infection, myocardial infarction, stroke, limb loss, nerve damage, need for additional procedures in the future, wound complications, and inability to complete the bypass.   The patient is aware of these risks and agreed to proceed.  I discussed in depth with the patient the nature of atherosclerosis, and emphasized the importance of maximal medical management including strict control of blood pressure, blood glucose, and lipid levels, antiplatelet agents, obtaining regular exercise, and cessation of smoking.  The patient is aware that without maximal medical management the underlying atherosclerotic disease process will progress, limiting the benefit of any interventions.  Tentatively, we'll schedule in the second or third week in January 2014.  Thank you for allowing [place] to participate in this patient's care.  Adele Barthel, MD [facility] [place] Office: [phone] Pager: [phone]  05/05/2012, 2:30 PM    Addendum  After further review of the patient's anatomy including a 3D reconstruction of the patient's aorta, it is evident that the taper in proximal 15 mm of the aortic neck violates the [employer] IFU.  Further review of the 3D reconstructions suggests that the patient's anatomy may be compatible with a Cook  fenestrated EVAR hybridized with an aortouni-iliac EVAR.  Unfortunately, this combination is not available locally, so referral to [facility] Dr. Sammuel Hines may be needed if this patient elects this option.  Options at this point include:  1.  Femorofemoral bypass without addressing the AAA 4.9 cm x 4.6 cm: my hesitancy is the AAA likely will need to be addressed within the next 1-2 years if it grows the reported average 0.3 cm/year  2.  Aortobifemoral bypass: After re-evaluating the aorta, the suprarenal segment does not appear as calcified as I previously thought.  I still have significant concerns about the feasibility of an open repair in this patient given the extensive calcific disease throughout the aorta.  Also, the patient has a number of co-morbidities including COPD which may make an open repair highly morbid if not mortal.  3.  Hybrid fenestrated EVAR and aortouni-iliac EVAR repair (previously noted option)  I have discussed my concerns with the patient over the phone (05/30/12), and we are canceling the case this Thursday.  We will meet on 10 JAN 14 to discuss his options.  Adele Barthel, MD [facility] [place] Office: [phone] Pager: [phone]  05/30/2012, 10:56 AM

## 2012-05-23 ENCOUNTER — Other Ambulatory Visit: Payer: Self-pay

## 2012-05-26 ENCOUNTER — Encounter (HOSPITAL_COMMUNITY): Payer: Self-pay | Admitting: Respiratory Therapy

## 2012-05-29 ENCOUNTER — Encounter (HOSPITAL_COMMUNITY): Payer: Self-pay

## 2012-05-29 ENCOUNTER — Ambulatory Visit (HOSPITAL_COMMUNITY)
Admission: RE | Admit: 2012-05-29 | Discharge: 2012-05-29 | Disposition: A | Discharge status: Home / Self Care | Payer: Managed Care, Other (non HMO) | Source: Ambulatory Visit | Attending: Anesthesiology | Admitting: Anesthesiology

## 2012-05-29 ENCOUNTER — Encounter (HOSPITAL_COMMUNITY)
Admission: RE | Admit: 2012-05-29 | Discharge: 2012-05-29 | Disposition: A | Discharge status: Home / Self Care | Payer: Managed Care, Other (non HMO) | Source: Ambulatory Visit | Attending: Vascular Surgery | Admitting: Vascular Surgery

## 2012-05-29 DIAGNOSIS — Z01818 Encounter for other preprocedural examination: Secondary | ICD-10-CM | POA: Insufficient documentation

## 2012-05-29 DIAGNOSIS — Z01812 Encounter for preprocedural laboratory examination: Secondary | ICD-10-CM | POA: Insufficient documentation

## 2012-05-29 HISTORY — DX: Anxiety disorder, unspecified: F41.9

## 2012-05-29 HISTORY — DX: Gastro-esophageal reflux disease without esophagitis: K21.9

## 2012-05-29 HISTORY — DX: Other specified postprocedural states: Z98.890

## 2012-05-29 HISTORY — DX: Personal history of other medical treatment: Z92.89

## 2012-05-29 HISTORY — DX: Other specified postprocedural states: R11.2

## 2012-05-29 LAB — COMPREHENSIVE METABOLIC PANEL
ALT: 24 U/L (ref 0–53)
AST: 37 U/L (ref 0–37)
Albumin: 3.9 g/dL (ref 3.5–5.2)
Alkaline Phosphatase: 100 U/L (ref 39–117)
BUN: 7 mg/dL (ref 6–23)
Potassium: 4.5 mEq/L (ref 3.5–5.1)
Sodium: 124 mEq/L — ABNORMAL LOW (ref 135–145)
Total Protein: 7.3 g/dL (ref 6.0–8.3)

## 2012-05-29 LAB — CBC
MCHC: 37 g/dL — ABNORMAL HIGH (ref 30.0–36.0)
Platelets: 198 10*3/uL (ref 150–400)
RDW: 12.1 % (ref 11.5–15.5)

## 2012-05-29 LAB — PROTIME-INR
INR: 0.98 (ref 0.00–1.49)
Prothrombin Time: 12.9 seconds (ref 11.6–15.2)

## 2012-05-29 LAB — BLOOD GAS, ARTERIAL
Drawn by: 344381
FIO2: 0.21 %
Patient temperature: 98.6
pCO2 arterial: 32.2 mmHg — ABNORMAL LOW (ref 35.0–45.0)
pH, Arterial: 7.461 — ABNORMAL HIGH (ref 7.350–7.450)

## 2012-05-29 LAB — TYPE AND SCREEN: ABO/RH(D): B POS

## 2012-05-29 NOTE — Progress Notes (Signed)
05/29/12 1325  OBSTRUCTIVE SLEEP APNEA  Have you ever been diagnosed with sleep apnea through a sleep study? No  Do you snore loudly (loud enough to be heard through closed doors)?  1  Do you often feel tired, fatigued, or sleepy during the daytime? 0  Has anyone observed you stop breathing during your sleep? 0  Do you have, or are you being treated for high blood pressure? 1  BMI more than 35 kg/m2? 0  Age over 64 years old? 1  Neck circumference greater than 40 cm/18 inches? 0  Gender: 1  Obstructive Sleep Apnea Score 4   Score 4 or greater  Results sent to PCP

## 2012-05-29 NOTE — Pre-Procedure Instructions (Signed)
20 [name]  05/29/2012   Your procedure is scheduled on:  Thursday, January 9th  Report to [facility] at 0730 AM.  Call this number if you have problems the morning of surgery: [phone]   Remember:   Do not eat food or drink:After Midnight.   Take these medicines the morning of surgery with A SIP OF WATER: norvasc, coreg, ativan if needed   Do not wear jewelry, make-up or nail polish.  Do not wear lotions, powders, or perfumes.   Do not shave 48 hours prior to surgery. Men may shave face and neck.  Do not bring valuables to the hospital.  Contacts, dentures or bridgework may not be worn into surgery.  Leave suitcase in the car. After surgery it may be brought to your room.  For patients admitted to the hospital, checkout time is 11:00 AM the day of discharge.   Patients discharged the day of surgery will not be allowed to drive home.    Special Instructions: Shower using CHG 2 nights before surgery and the night before surgery.  If you shower the day of surgery use CHG.  Use special wash - you have one bottle of CHG for all showers.  You should use approximately 1/3 of the bottle for each shower.   Please read over the following fact sheets that you were given: Pain Booklet, Coughing and Deep Breathing, Blood Transfusion Information, MRSA Information and Surgical Site Infection Prevention

## 2012-05-29 NOTE — Progress Notes (Signed)
Primary Physician - Dr. Alroy Dust Cardiologist - Dr. Burt Knack EKG - in epic No other cardiac workup

## 2012-05-30 ENCOUNTER — Encounter (HOSPITAL_COMMUNITY): Payer: Self-pay | Admitting: Vascular Surgery

## 2012-05-30 ENCOUNTER — Telehealth: Payer: Self-pay | Admitting: Vascular Surgery

## 2012-05-30 NOTE — Consult Note (Signed)
Anesthesia Chart Review:  Patient is a 62 year old male scheduled for EVAR of AAA (4.9 cm by CTA 03/2012), FFBG, and possible bilateral femoral endarterectomies by Dr. Bridgett Larsson on 06/01/12.  (Notes from [facility] today in Epic indicate that patient may be canceling his surgery for unknown reasons.)  History includes AAA, PAD with history of bilateral carotid endarterectomies '06/'07 and left CIA stent '07, former smoker, HLD, CAD, COPD, CVA '07, HTN, GERD, anxiety, history of transfusion, lumbar DDD, Raynaud's disease.  PCP is Dr. Maurine Cane.    Cardiologist is Dr. Burt Knack.  Per his telephone encounter on 04/14/12, "I have reviewed the patient's chart. I saw him in January of this year. He was recently seen by Truitt Merle. He was not having any anginal symptoms at either of those visits. His recent 12-lead EKG is within normal limits. I think he can proceed with surgery at low risk of cardiac problems and he should not require any further preoperative risk stratification. We would be happy to see him in the hospital if any problems arise."   EKG on 03/30/12 showed NSR.  Cardiac cath on 03/16/05 showed (done following abnormal stress on 03/15/05 showing fixed inferior wall defect suggestive of a small amount of peri-infarct marginal ischemia, inferior wall hypokinesis, EF 59%): 1. Left main: Mild calcification with moderate luminal irregularities and a distal 20-30% stenosis.  2. LAD: Proximal 40-50% stenosis with mild to moderate luminal irregularities.  3. D1 and D2: Mild luminal irregularities.  4. LCX: Nondominant with mild to moderate luminal irregularities.  5. OM-1 and OM-2: Small vessels with mild luminal irregularities.  6. RCA: Dominant, small vessel, less than 2 mm, with diffuse disease. He has a proximal to mid subtotal obstruction of the long area with a high branching vessel also noted. There is a distal sequential 70-80% stenosis noted prior to the PDA. He has moderate left-to-right  collaterals noted filling the distal vessel.  7. LV: EF is 55-60% with mild inferior hypokinesis. LVEDP was 10 mmHg.  8. Limited right femoral angiogram shows moderate calcification throughout the common femoral and iliac vessels with 50% stenosis noted in the common femoral. Medical therapy was recommended at that time.  CXR on 05/29/12 showed stable CXR, no active lung disease.  Preoperative labs noted.  Na decreased to 124, Cl 89.  Cr 0.78, glucose 116.  H/H 13.8/37.3  PT/PTT WNL.  He will need a repeat BMET prior to surgery to ensure improvement of his hyponatremia.  I have routed labs to Dr. Bridgett Larsson and Barron Schmid, RN @ [facility] and asked their office to notify patient if any additional recommendations.  Myra Gianotti, PA-C 05/30/12 1326

## 2012-05-30 NOTE — Telephone Encounter (Addendum)
Message copied by Lujean Amel on Tue May 30, 2012 11:45 AM ------      Message from: Alfonso Patten      Created: Tue May 30, 2012 11:12 AM       Dr Bridgett Larsson wants to see him Friday to discuss cancelling his surgery Thursday. He has called the man so he is aware the surgery is cancelled & the need to come to office      Thanks      Bethena Roys  I scheduled an appt for the above pt on Friday 06/02/12 at 11:30am to see [place]. The pt is aware of this appt.awt

## 2012-06-01 ENCOUNTER — Encounter: Payer: Self-pay | Admitting: Vascular Surgery

## 2012-06-01 ENCOUNTER — Ambulatory Visit (HOSPITAL_COMMUNITY)
Admission: RE | Admit: 2012-06-01 | Payer: Managed Care, Other (non HMO) | Source: Ambulatory Visit | Admitting: Vascular Surgery

## 2012-06-01 ENCOUNTER — Encounter (HOSPITAL_COMMUNITY): Admission: RE | Payer: Self-pay | Source: Ambulatory Visit

## 2012-06-01 SURGERY — INSERTION, ENDOVASCULAR STENT GRAFT, AORTA, ABDOMINAL
Anesthesia: General

## 2012-06-02 ENCOUNTER — Ambulatory Visit (INDEPENDENT_AMBULATORY_CARE_PROVIDER_SITE_OTHER): Payer: Managed Care, Other (non HMO) | Admitting: Vascular Surgery

## 2012-06-02 ENCOUNTER — Encounter: Payer: Self-pay | Admitting: Vascular Surgery

## 2012-06-02 VITALS — BP 118/62 | HR 63 | Ht 71.0 in | Wt 212.0 lb

## 2012-06-02 DIAGNOSIS — I714 Abdominal aortic aneurysm, without rupture: Secondary | ICD-10-CM

## 2012-06-02 NOTE — Progress Notes (Signed)
[facility] [place]   I asked Mr. [name] to come back to the office as I decided to cancel the patient's EVAR with femorofemoral bypass as I felt that there was not enough aortic neck to safely place a Renu aortouni-iliac device.  I discussed with the patient my concerns and laid out his options:  1. Femorofemoral bypass without addressing the 4.5 cm AAA: my hesitancy is the AAA likely will need to be addressed within the next 2-3 years if it grows the reported average 0.3 cm/year   2. Aortobifemoral bypass: After re-evaluating the aorta, the suprarenal segment does not appear as calcified as I previously thought. I still have significant concerns about the feasibility of an open repair in this patient given the extensive calcific disease throughout the aorta. Also, the patient has a number of co-morbidities including COPD which may make an open repair highly morbid if not mortal.   3. Hybrid fenestrated EVAR repair with femorofemoral repair: would require referral to [facility] Dr. Sammuel Hines to see if feasible.  My Lacinda Axon rep believes that the [facility] fenestrated graft would likely be compatible.  At this point, the patient still favors a one time repair option and would like to see Dr. Sammuel Hines to see if he can be repaired with a fenestrated endograft.  Will make the arranges for this referral.  Due to schedule conflicts, the patient is requesting an early March appointment if possible.     Adele Barthel, MD [facility] [place] Office: [phone] Pager: [phone]  06/02/2012, 12:42 PM

## 2012-07-18 ENCOUNTER — Ambulatory Visit (INDEPENDENT_AMBULATORY_CARE_PROVIDER_SITE_OTHER): Payer: Managed Care, Other (non HMO) | Admitting: Cardiovascular Disease

## 2012-07-18 ENCOUNTER — Encounter: Payer: Self-pay | Admitting: Cardiovascular Disease

## 2012-07-18 VITALS — BP 132/80 | HR 65 | Ht 71.0 in | Wt 211.0 lb

## 2012-07-18 DIAGNOSIS — I2510 Atherosclerotic heart disease of native coronary artery without angina pectoris: Secondary | ICD-10-CM

## 2012-07-18 DIAGNOSIS — I10 Essential (primary) hypertension: Secondary | ICD-10-CM

## 2012-07-18 MED ORDER — LORAZEPAM 0.5 MG PO TABS: 0.5000 mg | ORAL_TABLET | Freq: Every day | ORAL | Status: DC | PRN

## 2012-07-18 NOTE — Patient Instructions (Signed)
Your physician wants you to follow-up in: 6 MONTHS with Truitt Merle NP. You will receive a reminder letter in the mail two months in advance. If you don't receive a letter, please call our office to schedule the follow-up appointment.  Your physician recommends that you schedule a follow-up appointment with Dr Alroy Dust for discussion of Hyponatremia.  Your physician recommends that you continue on your current medications as directed. Please refer to the Current Medication list given to you today.

## 2012-07-18 NOTE — Progress Notes (Signed)
HPI:  62 year old gentleman presenting for followup evaluation. He was last seen in September by Truitt Merle. The patient is followed for coronary and peripheral arterial disease. He has been closely followed by Dr. Bridgett Larsson with vascular surgery. He has an abdominal aortic aneurysm and iliac stenoses. He is being referred to vascular surgery at [facility] for consideration of complex endovascular intervention.  From a cardiac perspective, he is doing well. He denies chest pain, dyspnea, orthopnea, or PND. He's had no recent episodes of lightheadedness or syncope. His physical activity is limited by claudication symptoms.  Outpatient Encounter Prescriptions as of 07/18/2012  Medication Sig Dispense Refill  . amLODipine (NORVASC) 2.5 MG tablet Take 2.5 mg by mouth daily.      Marland Kitchen aspirin 81 MG tablet Take 81 mg by mouth daily.        . carvedilol (COREG) 25 MG tablet Take 37.5 mg by mouth 2 (two) times daily with a meal.      . clopidogrel (PLAVIX) 75 MG tablet Take 75 mg by mouth daily.      . folic acid (FOLVITE) 1 MG tablet Take 1 mg by mouth daily.      Marland Kitchen lisinopril (PRINIVIL,ZESTRIL) 20 MG tablet Take 20 mg by mouth 2 (two) times daily.      Marland Kitchen LORazepam (ATIVAN) 0.5 MG tablet Take 1 tablet (0.5 mg total) by mouth daily as needed. For anxiety  10 tablet  0  . nitroGLYCERIN (NITROSTAT) 0.4 MG SL tablet Place 1 tablet (0.4 mg total) under the tongue every 5 (five) minutes as needed.  25 tablet  6  . ranitidine (ZANTAC) 300 MG tablet Take 300 mg by mouth at bedtime.      . rosuvastatin (CRESTOR) 20 MG tablet Take 20 mg by mouth at bedtime.      . Thiamine HCl (VITAMIN B-1) 100 MG tablet Take 100 mg by mouth daily.        . [DISCONTINUED] LORazepam (ATIVAN) 0.5 MG tablet Take 0.5 mg by mouth daily as needed. For anxiety       No facility-administered encounter medications on file as of 07/18/2012.    Allergies  Allergen Reactions  . Pletal (Cilostazol) Diarrhea    Past Medical History    Diagnosis Date  . Hyperlipidemia   . Coronary artery disease   . AAA (abdominal aortic aneurysm)   . PAD (peripheral artery disease)   . COPD (chronic obstructive pulmonary disease)   . Raynaud's disease /phenomenon   . Lumbar degenerative disc disease   . Stroke 10/15/2005  . PONV (postoperative nausea and vomiting)   . Hypertension     takes meds daily  . Anxiety   . GERD (gastroesophageal reflux disease)   . History of blood transfusion     ROS: Negative except as per HPI  BP 132/80  Pulse 65  Ht 5\' 11"  (1.803 m)  Wt 95.709 kg (211 lb)  BMI 29.44 kg/m2  SpO2 96%  PHYSICAL EXAM: Pt is alert and oriented, NAD HEENT: normal Neck: JVP - normal, carotids 2+= with bilateral bruits Lungs: CTA bilaterally CV: RRR without murmur or gallop Abd: soft, NT, Positive BS, no hepatomegaly Ext: No edema Skin: warm/dry no rash  ASSESSMENT AND PLAN: 1. Coronary atherosclerosis, native vessel. I reviewed the patient's cardiac cath report from 2006. He had moderately severe stenosis of a small right coronary artery. He did not undergo intervention because of the small vessel size. He has not had any cardiac ischemic events  with medical therapy. He currently has no anginal symptoms. I think he can proceed with his planned vascular surgery and it is reasonable to hold Plavix prior to his surgery if required.  2. Hypertension. Blood pressure is well controlled.  3. Hyponatremia. I reviewed his metabolic panel from last month. His sodium is down to 124. He is not on a diuretic therapy. I recommended that he followup with his primary care physician, Dr. Alroy Dust. I suspect he will need some further evaluation. His hyponatremia appears chronic, but this is the lowest sodium value that I have seen for him. Clinically he appears euvolemic.  For follow-up he will see Truitt Merle back in 6 months.  [name] 07/18/2012 9:34 PM

## 2012-09-01 ENCOUNTER — Other Ambulatory Visit: Payer: Self-pay | Admitting: *Deleted

## 2012-09-01 DIAGNOSIS — Z48812 Encounter for surgical aftercare following surgery on the circulatory system: Secondary | ICD-10-CM

## 2012-09-08 ENCOUNTER — Ambulatory Visit: Payer: Managed Care, Other (non HMO) | Admitting: Neurosurgery

## 2012-09-08 ENCOUNTER — Other Ambulatory Visit: Payer: Managed Care, Other (non HMO)

## 2012-09-15 ENCOUNTER — Other Ambulatory Visit (INDEPENDENT_AMBULATORY_CARE_PROVIDER_SITE_OTHER): Payer: Managed Care, Other (non HMO) | Admitting: *Deleted

## 2012-09-15 DIAGNOSIS — Z48812 Encounter for surgical aftercare following surgery on the circulatory system: Secondary | ICD-10-CM

## 2012-09-15 DIAGNOSIS — I6529 Occlusion and stenosis of unspecified carotid artery: Secondary | ICD-10-CM

## 2012-09-18 ENCOUNTER — Other Ambulatory Visit: Payer: Self-pay

## 2012-09-18 DIAGNOSIS — Z48812 Encounter for surgical aftercare following surgery on the circulatory system: Secondary | ICD-10-CM

## 2012-09-19 ENCOUNTER — Encounter: Payer: Self-pay | Admitting: Vascular Surgery

## 2012-09-26 ENCOUNTER — Other Ambulatory Visit: Payer: Self-pay | Admitting: Cardiovascular Disease

## 2012-11-29 ENCOUNTER — Other Ambulatory Visit: Payer: Self-pay | Admitting: *Deleted

## 2012-11-29 MED ORDER — RANITIDINE HCL 300 MG PO TABS: 300.0000 mg | ORAL_TABLET | Freq: Every day | ORAL | Status: DC

## 2012-11-29 MED ORDER — ROSUVASTATIN CALCIUM 20 MG PO TABS: 20.0000 mg | ORAL_TABLET | Freq: Every day | ORAL | Status: DC

## 2012-12-15 ENCOUNTER — Other Ambulatory Visit: Payer: Self-pay | Admitting: Cardiovascular Disease

## 2013-01-02 ENCOUNTER — Other Ambulatory Visit: Payer: Self-pay | Admitting: Cardiovascular Disease

## 2013-01-12 ENCOUNTER — Encounter: Payer: Self-pay | Admitting: Nurse Practitioner

## 2013-01-12 ENCOUNTER — Ambulatory Visit (INDEPENDENT_AMBULATORY_CARE_PROVIDER_SITE_OTHER): Payer: Managed Care, Other (non HMO) | Admitting: Nurse Practitioner

## 2013-01-12 VITALS — BP 110/64 | HR 72 | Ht 71.0 in | Wt 186.4 lb

## 2013-01-12 DIAGNOSIS — R634 Abnormal weight loss: Secondary | ICD-10-CM

## 2013-01-12 DIAGNOSIS — I2510 Atherosclerotic heart disease of native coronary artery without angina pectoris: Secondary | ICD-10-CM

## 2013-01-12 LAB — CBC WITH DIFFERENTIAL/PLATELET
Basophils Absolute: 0 10*3/uL (ref 0.0–0.1)
Basophils Relative: 0.8 % (ref 0.0–3.0)
Eosinophils Absolute: 0.3 10*3/uL (ref 0.0–0.7)
Eosinophils Relative: 5.4 % — ABNORMAL HIGH (ref 0.0–5.0)
HCT: 32.9 % — ABNORMAL LOW (ref 39.0–52.0)
Hemoglobin: 11.5 g/dL — ABNORMAL LOW (ref 13.0–17.0)
Lymphocytes Relative: 15.1 % (ref 12.0–46.0)
Lymphs Abs: 0.8 10*3/uL (ref 0.7–4.0)
MCHC: 34.9 g/dL (ref 30.0–36.0)
MCV: 103.2 fl — ABNORMAL HIGH (ref 78.0–100.0)
Monocytes Absolute: 0.7 10*3/uL (ref 0.1–1.0)
Monocytes Relative: 12.6 % — ABNORMAL HIGH (ref 3.0–12.0)
Neutro Abs: 3.5 10*3/uL (ref 1.4–7.7)
Neutrophils Relative %: 66.1 % (ref 43.0–77.0)
Platelets: 240 10*3/uL (ref 150.0–400.0)
RBC: 3.18 Mil/uL — ABNORMAL LOW (ref 4.22–5.81)
RDW: 12.7 % (ref 11.5–14.6)
WBC: 5.3 10*3/uL (ref 4.5–10.5)

## 2013-01-12 LAB — LIPID PANEL
Cholesterol: 111 mg/dL (ref 0–200)
HDL: 43.8 mg/dL (ref >39.00)
LDL Cholesterol: 53 mg/dL (ref 0–99)
Total CHOL/HDL Ratio: 3
Triglycerides: 72 mg/dL (ref 0.0–149.0)
VLDL: 14.4 mg/dL (ref 0.0–40.0)

## 2013-01-12 LAB — HEPATIC FUNCTION PANEL
ALT: 15 U/L (ref 0–53)
AST: 22 U/L (ref 0–37)
Albumin: 3.6 g/dL (ref 3.5–5.2)
Alkaline Phosphatase: 78 U/L (ref 39–117)
Bilirubin, Direct: 0.2 mg/dL (ref 0.0–0.3)
Total Bilirubin: 1.7 mg/dL — ABNORMAL HIGH (ref 0.3–1.2)
Total Protein: 7.1 g/dL (ref 6.0–8.3)

## 2013-01-12 LAB — BASIC METABOLIC PANEL
BUN: 8 mg/dL (ref 6–23)
CO2: 24 mEq/L (ref 19–32)
Calcium: 9.1 mg/dL (ref 8.4–10.5)
Chloride: 99 mEq/L (ref 96–112)
Creatinine, Ser: 1 mg/dL (ref 0.4–1.5)
GFR: 78.02 mL/min (ref >60.00)
Glucose, Bld: 91 mg/dL (ref 70–99)
Potassium: 4.4 mEq/L (ref 3.5–5.1)
Sodium: 129 mEq/L — ABNORMAL LOW (ref 135–145)

## 2013-01-12 LAB — TSH: TSH: 0.74 u[IU]/mL (ref 0.35–5.50)

## 2013-01-12 NOTE — Progress Notes (Addendum)
[name] Date of Birth: 16-Nov-1950 Medical Record [id number]  History of Present Illness: Mr. Landgraf is seen back today for a 4 month check. Seen for Dr. Burt Knack. Former patient of Dr. Susa Simmonds. He has an extensive history of PVD and CAD. He is followed by Dr. Bridgett Larsson at [facility] but now seeing Vascular in [facility]. He has had remote cath in 2006 showing normal LV function with a totally occluded RCA with left to right collaterals. He has distal small vessel disease in the RCA with diffuse mild to moderate stenosis in the LAD. Has never had a MI. No longer smoking. His PVD history is quite extensive and includes AAA (with no plans to intervene due to the nature of his aneurysm and his multiple comorbidities).  Other problems include HTN, HLD and OA. He has a history of hyponatremia.   Last seen by Dr. Burt Knack back in February for a pre op visit - felt to be doing well.  Last sodium level from January was 124.   He comes in today. He is here with his wife. He is doing well. He has been sick over the summer with a sinus infection - has affected his taste buds/appetite - has lost a significant amount of weight - BP now running lower - was dizzy earlier in the week. No chest pain. Less active due to his claudication. Now being followed by vascular at [facility] - no plans for intervention. Remains short of breath but unchanged. Walking less due to his claudication but still trying. Never saw his PCP for his sodium level - he is fasting today and needs labs drawn.  Current Outpatient Prescriptions  Medication Sig Dispense Refill  . amLODipine (NORVASC) 2.5 MG tablet Take 2.5 mg by mouth daily.      Marland Kitchen aspirin 81 MG tablet Take 81 mg by mouth daily.        . carvedilol (COREG) 25 MG tablet take 1 and 1/2 tablet by mouth twice a day  90 tablet  5  . cilostazol (PLETAL) 100 MG tablet Take 100 mg by mouth daily.      . clopidogrel (PLAVIX) 75 MG tablet Take 75 mg by mouth daily.      . folic acid  (FOLVITE) 1 MG tablet Take 1 mg by mouth daily.      Marland Kitchen lisinopril (PRINIVIL,ZESTRIL) 20 MG tablet Take 20 mg by mouth 2 (two) times daily.      . nitroGLYCERIN (NITROSTAT) 0.4 MG SL tablet Place 1 tablet (0.4 mg total) under the tongue every 5 (five) minutes as needed.  25 tablet  6  . ranitidine (ZANTAC) 300 MG tablet Take 1 tablet (300 mg total) by mouth at bedtime.  30 tablet  6  . rosuvastatin (CRESTOR) 20 MG tablet Take 1 tablet (20 mg total) by mouth at bedtime.  30 tablet  5  . Thiamine HCl (VITAMIN B-1) 100 MG tablet Take 100 mg by mouth daily.         No current facility-administered medications for this visit.    Allergies  Allergen Reactions  . Pletal [Cilostazol] Diarrhea    Past Medical History  Diagnosis Date  . Hyperlipidemia   . Coronary artery disease   . AAA (abdominal aortic aneurysm)   . PAD (peripheral artery disease)   . COPD (chronic obstructive pulmonary disease)   . Raynaud's disease /phenomenon   . Lumbar degenerative disc disease   . Stroke 10/15/2005  . PONV (postoperative nausea and vomiting)   .  Hypertension     takes meds daily  . Anxiety   . GERD (gastroesophageal reflux disease)   . History of blood transfusion     Past Surgical History  Procedure Laterality Date  . Iliac artery stent  10/15/2005     Left common and external   . Cardiac catheterization  2006  . Carotid endarterectomy      bilateral  . Carotid endarterectomy  04/13/2005    Right and 06/03/2005 Left  . Abdominal angiogram  03/30/2012  . Hernia repair      History  Smoking status  . Former Smoker  . Types: Cigarettes  . Quit date: 05/24/2005  Smokeless tobacco  . Never Used    History  Alcohol Use  . 14.4 oz/week  . 24 Cans of beer per week    Comment: approximately 24 beer a week    Family History  Problem Relation Age of Onset  . Coronary artery disease Father 76    HTN, Hyperlipidemia died  . Heart disease Father     Heart Disease before age 63  .  Hypertension Father   . Hyperlipidemia Father   . Peripheral vascular disease Father   . Other Mother 59     died rheumatoid arthritis  . Dementia Mother   . Heart disease Brother   . Hypertension Brother   . Hyperlipidemia Brother     Review of Systems: The review of systems is per the HPI.  All other systems were reviewed and are negative.  Physical Exam: BP 110/64  Pulse 72  Ht 5\' 11"  (1.803 m)  Wt 186 lb 6.4 oz (84.55 kg)  BMI 26.01 kg/m2 Patient is very pleasant and in no acute distress. Weight is down 25 pounds since February. Skin is warm and dry. Color is normal.  HEENT is unremarkable. Normocephalic/atraumatic. PERRL. Sclera are nonicteric. Neck is supple. No masses. No JVD. Lungs are clear. Cardiac exam shows a regular rate and rhythm. Abdomen is soft. Extremities are without edema. Gait and ROM are intact. No gross neurologic deficits noted.   LABORATORY DATA: PENDING   Lab Results  Component Value Date   WBC 6.6 05/29/2012   HGB 13.8 05/29/2012   HCT 37.3* 05/29/2012   PLT 198 05/29/2012   GLUCOSE 116* 05/29/2012   CHOL 135 10/28/2011   TRIG 69.0 10/28/2011   HDL 65.70 10/28/2011   LDLCALC 56 10/28/2011   ALT 24 05/29/2012   AST 37 05/29/2012   NA 124* 05/29/2012   K 4.5 05/29/2012   CL 89* 05/29/2012   CREATININE 0.78 05/29/2012   BUN 7 05/29/2012   CO2 20 05/29/2012   INR 0.98 05/29/2012     Assessment / Plan:  1. HTN - BP running lower at home - I have stopped his Norvasc - they will continue to monitor and be in touch if we need to make further adjustments. Tentatively see him back in 4 months.   2. Hyponatremia - was advised back in February to see his PCP for evaluation - has not done - will recheck today.   3. CAD - managed medically. Has a known moderately severe stenosis of a small RCA no amenable to intervention - no chest pain reported.   4. HLD - recheck labs today.  5. PVD - managed conservatively now - followed by [facility].   6. Weight loss - seems to be the  result of prolonged sinus infection/poor taste buds/poor appetite. Will need to follow.   Patient is  agreeable to this plan and will call if any problems develop in the interim.   Burtis Junes, RN, ANP-C [place] [address] [place], [place]  [address]

## 2013-01-12 NOTE — Patient Instructions (Addendum)
We need to check labs today  Continue with your current medicines but we are stopping the Amlodipine (Norvasc)  Keep checking your blood pressure - let me know if you stay below [id number] systolic (top number)  I will see you in 4 months  Call the [place] office at [phone] if you have any questions, problems or concerns.

## 2013-01-15 ENCOUNTER — Other Ambulatory Visit: Payer: Self-pay | Admitting: Cardiovascular Disease

## 2013-04-18 ENCOUNTER — Other Ambulatory Visit: Payer: Self-pay | Admitting: Cardiovascular Disease

## 2013-05-14 ENCOUNTER — Ambulatory Visit (INDEPENDENT_AMBULATORY_CARE_PROVIDER_SITE_OTHER): Payer: Managed Care, Other (non HMO) | Admitting: Nurse Practitioner

## 2013-05-14 ENCOUNTER — Encounter: Payer: Self-pay | Admitting: Nurse Practitioner

## 2013-05-14 VITALS — BP 140/62 | HR 64 | Ht 71.0 in | Wt 204.2 lb

## 2013-05-14 DIAGNOSIS — I10 Essential (primary) hypertension: Secondary | ICD-10-CM

## 2013-05-14 DIAGNOSIS — E785 Hyperlipidemia, unspecified: Secondary | ICD-10-CM | POA: Diagnosis not present

## 2013-05-14 DIAGNOSIS — I2510 Atherosclerotic heart disease of native coronary artery without angina pectoris: Secondary | ICD-10-CM | POA: Diagnosis not present

## 2013-05-14 LAB — LIPID PANEL
Cholesterol: 133 mg/dL (ref 0–200)
HDL: 73.3 mg/dL (ref >39.00)
LDL Cholesterol: 49 mg/dL (ref 0–99)
Total CHOL/HDL Ratio: 2
Triglycerides: 53 mg/dL (ref 0.0–149.0)
VLDL: 10.6 mg/dL (ref 0.0–40.0)

## 2013-05-14 LAB — CBC WITH DIFFERENTIAL/PLATELET
Basophils Absolute: 0 10*3/uL (ref 0.0–0.1)
Basophils Relative: 0.5 % (ref 0.0–3.0)
Eosinophils Absolute: 0.5 10*3/uL (ref 0.0–0.7)
Eosinophils Relative: 7.4 % — ABNORMAL HIGH (ref 0.0–5.0)
HCT: 34.8 % — ABNORMAL LOW (ref 39.0–52.0)
Hemoglobin: 12.1 g/dL — ABNORMAL LOW (ref 13.0–17.0)
Lymphocytes Relative: 14 % (ref 12.0–46.0)
Lymphs Abs: 0.9 10*3/uL (ref 0.7–4.0)
MCHC: 34.7 g/dL (ref 30.0–36.0)
MCV: 104.3 fl — ABNORMAL HIGH (ref 78.0–100.0)
Monocytes Absolute: 0.9 10*3/uL (ref 0.1–1.0)
Monocytes Relative: 13.6 % — ABNORMAL HIGH (ref 3.0–12.0)
Neutro Abs: 4 10*3/uL (ref 1.4–7.7)
Neutrophils Relative %: 64.5 % (ref 43.0–77.0)
Platelets: 216 10*3/uL (ref 150.0–400.0)
RBC: 3.34 Mil/uL — ABNORMAL LOW (ref 4.22–5.81)
RDW: 13 % (ref 11.5–14.6)
WBC: 6.3 10*3/uL (ref 4.5–10.5)

## 2013-05-14 LAB — BASIC METABOLIC PANEL
BUN: 10 mg/dL (ref 6–23)
CO2: 24 mEq/L (ref 19–32)
Calcium: 9 mg/dL (ref 8.4–10.5)
Chloride: 92 mEq/L — ABNORMAL LOW (ref 96–112)
Creatinine, Ser: 0.8 mg/dL (ref 0.4–1.5)
GFR: 98.87 mL/min (ref >60.00)
Glucose, Bld: 91 mg/dL (ref 70–99)
Potassium: 4.6 mEq/L (ref 3.5–5.1)
Sodium: 126 mEq/L — ABNORMAL LOW (ref 135–145)

## 2013-05-14 LAB — HEPATIC FUNCTION PANEL
ALT: 15 U/L (ref 0–53)
AST: 24 U/L (ref 0–37)
Albumin: 4.2 g/dL (ref 3.5–5.2)
Alkaline Phosphatase: 67 U/L (ref 39–117)
Bilirubin, Direct: 0.3 mg/dL (ref 0.0–0.3)
Total Bilirubin: 2.4 mg/dL — ABNORMAL HIGH (ref 0.3–1.2)
Total Protein: 7.2 g/dL (ref 6.0–8.3)

## 2013-05-14 MED ORDER — ISOSORBIDE MONONITRATE ER 30 MG PO TB24: 30.0000 mg | ORAL_TABLET | Freq: Every day | ORAL | Status: DC

## 2013-05-14 MED ORDER — NITROGLYCERIN 0.4 MG SL SUBL: 0.4000 mg | SUBLINGUAL_TABLET | SUBLINGUAL | Status: DC | PRN

## 2013-05-14 NOTE — Progress Notes (Addendum)
[name] Date of Birth: 08-19-1950 Medical Record [id number]  History of Present Illness: Mr. Yepes is seen back today for a 4 month check. Seen for Dr. Burt Knack. Former patient of Dr. Susa Simmonds. He has an extensive history of PVD and CAD. He is followed by Dr. Bridgett Larsson at [facility] but now seeing Vascular in [facility]. He has had remote cath in 2006 showing normal LV function with a totally occluded RCA with left to right collaterals. He has distal small vessel disease in the RCA with diffuse mild to moderate stenosis in the LAD. Has never had a MI. No longer smoking. His PVD history is quite extensive and includes AAA (with no plans to intervene due to the nature of his aneurysm and his multiple comorbidities). Other problems include HTN, HLD and OA. He has a history of hyponatremia.   Seen 4 months ago - was doing well. He had been sick over the summer with a sinus infection - has affected his taste buds/appetite - has lost a significant amount of weight - BP was running lower. Less active due to his claudication. Now being followed by vascular at [facility] - no plans for intervention. Remains short of breath but unchanged. Walking less due to his claudication but still trying. Never saw his PCP for his low sodium level. I stopped his Norvasc at that visit.   Comes back today. Here with his wife. Doing ok but notes that he has not felt as well over the past 2 weeks or so. Has a "heavy" feeling - first he says in his chest - then he says its an all over feeling - worse with exertion - better with rest. No NTG use. May have some radiation up to the neck. Getting worse over this past 2 weeks. Due to go to [facility] in February for his PV check up. Still with claudication that limits his activity level. Has put his weight back on that he had lost - wife restarted the Norvasc due to increased BP.  Current Outpatient Prescriptions  Medication Sig Dispense Refill  . amLODipine (NORVASC) 2.5 MG tablet  Take 2.5 mg by mouth daily.      Marland Kitchen aspirin 81 MG tablet Take 81 mg by mouth daily.        . carvedilol (COREG) 25 MG tablet take 1 and 1/2 tablet by mouth twice a day  90 tablet  5  . cilostazol (PLETAL) 100 MG tablet Take 100 mg by mouth daily. Twice a week      . clopidogrel (PLAVIX) 75 MG tablet take 1 tablet by mouth once daily  30 tablet  3  . folic acid (FOLVITE) 1 MG tablet Take 1 mg by mouth daily.      Marland Kitchen lisinopril (PRINIVIL,ZESTRIL) 20 MG tablet take 1 tablet by mouth twice a day  60 tablet  6  . nitroGLYCERIN (NITROSTAT) 0.4 MG SL tablet Place 1 tablet (0.4 mg total) under the tongue every 5 (five) minutes as needed.  25 tablet  6  . ranitidine (ZANTAC) 300 MG tablet Take 1 tablet (300 mg total) by mouth at bedtime.  30 tablet  6  . rosuvastatin (CRESTOR) 20 MG tablet Take 1 tablet (20 mg total) by mouth at bedtime.  30 tablet  5  . Thiamine HCl (VITAMIN B-1) 100 MG tablet Take 100 mg by mouth daily.        . isosorbide mononitrate (IMDUR) 30 MG 24 hr tablet Take 1 tablet (30 mg  total) by mouth daily.  30 tablet  3   No current facility-administered medications for this visit.    Allergies  Allergen Reactions  . Pletal [Cilostazol] Diarrhea    Past Medical History  Diagnosis Date  . Hyperlipidemia   . Coronary artery disease   . AAA (abdominal aortic aneurysm)   . PAD (peripheral artery disease)   . COPD (chronic obstructive pulmonary disease)   . Raynaud's disease /phenomenon   . Lumbar degenerative disc disease   . Stroke 10/15/2005  . PONV (postoperative nausea and vomiting)   . Hypertension     takes meds daily  . Anxiety   . GERD (gastroesophageal reflux disease)   . History of blood transfusion     Past Surgical History  Procedure Laterality Date  . Iliac artery stent  10/15/2005     Left common and external   . Cardiac catheterization  2006  . Carotid endarterectomy      bilateral  . Carotid endarterectomy  04/13/2005    Right and 06/03/2005 Left  .  Abdominal angiogram  03/30/2012  . Hernia repair      History  Smoking status  . Former Smoker  . Types: Cigarettes  . Quit date: 05/24/2005  Smokeless tobacco  . Never Used    History  Alcohol Use  . 14.4 oz/week  . 24 Cans of beer per week    Comment: approximately 24 beer a week    Family History  Problem Relation Age of Onset  . Coronary artery disease Father 28    HTN, Hyperlipidemia died  . Heart disease Father     Heart Disease before age 47  . Hypertension Father   . Hyperlipidemia Father   . Peripheral vascular disease Father   . Other Mother 60     died rheumatoid arthritis  . Dementia Mother   . Heart disease Brother   . Hypertension Brother   . Hyperlipidemia Brother     Review of Systems: The review of systems is per the HPI.  All other systems were reviewed and are negative.  Physical Exam: BP 140/62  Pulse 64  Ht 5\' 11"  (1.803 m)  Wt 204 lb 3.2 oz (92.625 kg)  BMI 28.49 kg/m2 Patient is very pleasant and in no acute distress. Looks chronically ill. Skin is warm and dry. Color is sallow.  HEENT is unremarkable. Normocephalic/atraumatic. PERRL. Sclera are nonicteric. Neck is supple. No masses. No JVD. Lungs are clear. Cardiac exam shows a regular rate and rhythm. Abdomen is soft. Extremities are without edema. Gait and ROM are intact. No gross neurologic deficits noted.  Wt Readings from Last 3 Encounters:  05/14/13 204 lb 3.2 oz (92.625 kg)  01/12/13 186 lb 6.4 oz (84.55 kg)  07/18/12 211 lb (95.709 kg)     LABORATORY DATA: EKG today shows sinus. T wave inversion in AVL which is new compared to last tracing.   Lab Results  Component Value Date   WBC 5.3 01/12/2013   HGB 11.5* 01/12/2013   HCT 32.9* 01/12/2013   PLT 240.0 01/12/2013   GLUCOSE 91 01/12/2013   CHOL 111 01/12/2013   TRIG 72.0 01/12/2013   HDL 43.80 01/12/2013   LDLCALC 53 01/12/2013   ALT 15 01/12/2013   AST 22 01/12/2013   NA 129* 01/12/2013   K 4.4 01/12/2013   CL 99 01/12/2013    CREATININE 1.0 01/12/2013   BUN 8 01/12/2013   CO2 24 01/12/2013   TSH 0.74 01/12/2013  INR 0.98 05/29/2012    Assessment / Plan: 1. HTN - BP fair. Back on his Norvasc.   2. Hyponatremia - recheck labs today  3. HLD - on statin  4. PVD - followed by [facility] - to see them in February  5. CAD - managed medically - known moderately severe stenosis of a small RCA not amenable to intervention - now with what sounds like angina. He has multiple risk factors. Will add Imdur 30 mg a day. Arrange for [employer]. Check EKG today. Will see him back for discussion. Not using any drugs for ED.   6. Past weight loss - resolved.   Patient is agreeable to this plan and will call if any problems develop in the interim.   Burtis Junes, RN, [facility] [address] [place], [place]  [address]   Addendum:  Brantley Fling was reviewed with Dr. Burt Knack -  "Cecille Rubin - I looked at his images and old cath report from [facility] in 2006. Looks like he had subtotal occlusion of his RCA and this defect fits with that anatomy. With that said, if he is having chest pain I would probably go ahead and cath him."  Ronalee Belts

## 2013-05-14 NOTE — Patient Instructions (Addendum)
We need to check labs today  We will arrange for a Myoview (Lexiscan)  Stay on your current medicines but I have refilled your NTG today  I am also starting you on Imdur 30 mg a day - long acting form of NTG to help you feel better.  See me and Dr. Burt Knack in about 2 weeks  Call the [place] office at [phone] if you have any questions, problems or concerns.

## 2013-05-15 ENCOUNTER — Ambulatory Visit (HOSPITAL_COMMUNITY): Payer: Managed Care, Other (non HMO) | Attending: Nurse Practitioner | Admitting: Radiology

## 2013-05-15 VITALS — BP 133/73 | HR 61 | Ht 71.0 in | Wt 200.0 lb

## 2013-05-15 DIAGNOSIS — Z8673 Personal history of transient ischemic attack (TIA), and cerebral infarction without residual deficits: Secondary | ICD-10-CM | POA: Insufficient documentation

## 2013-05-15 DIAGNOSIS — Z8249 Family history of ischemic heart disease and other diseases of the circulatory system: Secondary | ICD-10-CM | POA: Insufficient documentation

## 2013-05-15 DIAGNOSIS — E785 Hyperlipidemia, unspecified: Secondary | ICD-10-CM

## 2013-05-15 DIAGNOSIS — Z87891 Personal history of nicotine dependence: Secondary | ICD-10-CM | POA: Insufficient documentation

## 2013-05-15 DIAGNOSIS — I714 Abdominal aortic aneurysm, without rupture, unspecified: Secondary | ICD-10-CM | POA: Insufficient documentation

## 2013-05-15 DIAGNOSIS — R0602 Shortness of breath: Secondary | ICD-10-CM | POA: Insufficient documentation

## 2013-05-15 DIAGNOSIS — R079 Chest pain, unspecified: Secondary | ICD-10-CM

## 2013-05-15 DIAGNOSIS — I2510 Atherosclerotic heart disease of native coronary artery without angina pectoris: Secondary | ICD-10-CM

## 2013-05-15 DIAGNOSIS — I739 Peripheral vascular disease, unspecified: Secondary | ICD-10-CM | POA: Insufficient documentation

## 2013-05-15 DIAGNOSIS — I10 Essential (primary) hypertension: Secondary | ICD-10-CM

## 2013-05-15 MED ORDER — TECHNETIUM TC 99M SESTAMIBI GENERIC - CARDIOLITE
33.0000 | Freq: Once | INTRAVENOUS | Status: AC | PRN
  Administered 2013-05-15: 33 via INTRAVENOUS

## 2013-05-15 MED ORDER — TECHNETIUM TC 99M SESTAMIBI GENERIC - CARDIOLITE
11.0000 | Freq: Once | INTRAVENOUS | Status: AC | PRN
  Administered 2013-05-15: 11 via INTRAVENOUS

## 2013-05-15 MED ORDER — REGADENOSON 0.4 MG/5ML IV SOLN
0.4000 mg | Freq: Once | INTRAVENOUS | Status: AC
  Administered 2013-05-15: 0.4 mg via INTRAVENOUS

## 2013-05-15 NOTE — Progress Notes (Signed)
[facility] [address] [place], [place] [address] [phone]    Cardiology Nuclear Med Study  [name] is a 62 y.o. male     MRN : [id number]     DOB: May 03, 1951  Procedure Date: 05/15/2013  Nuclear Med Background Indication for Stress Test:  Evaluation for Ischemia History:  CAD, Cath, MPI (ishemia) EF 59%, COPD Cardiac Risk Factors: Carotid Disease, CVA, Family History - CAD, History of Smoking, Hypertension, Lipids and PVD -AAA 4.4 Symptoms:  SOB and Chest heaviness that radiates into the neck   Nuclear Pre-Procedure Caffeine/Decaff Intake:  None NPO After: 8:00pm   Lungs:  clear O2 Sat: 95% on room air. IV 0.9% NS with Angio Cath:  22g  IV Site: R Hand  IV Started by:  Crissie Figures, RN  Chest Size (in):  48 Cup Size: n/a  Height: 5\' 11"  (1.803 m)  Weight:  200 lb (90.719 kg)  BMI:  Body mass index is 27.91 kg/(m^2). Tech Comments:  N/A    Nuclear Med Study 1 or 2 day study: 1 day  Stress Test Type:  Lexiscan  Reading MD: Kirk Ruths, MD  Order Authorizing Provider:  [name], MD  Resting Radionuclide: Technetium 90m Sestamibi  Resting Radionuclide Dose: 11.0 mCi   Stress Radionuclide:  Technetium 39m Sestamibi  Stress Radionuclide Dose: 33.0 mCi           Stress Protocol Rest HR: 61 Stress HR: 81  Rest BP: 133/73 Stress BP: 145/77  Exercise Time (min): n/a METS: n/a           Dose of Adenosine (mg):  n/a Dose of Lexiscan: 0.4 mg  Dose of Atropine (mg): n/a Dose of Dobutamine: n/a mcg/kg/min (at max HR)  Stress Test Technologist: Glade Lloyd, BS-ES  Nuclear Technologist:  Annye Rusk, CNMT     Rest Procedure:  Myocardial perfusion imaging was performed at rest 45 minutes following the intravenous administration of Technetium 34m Sestamibi. Rest ECG: NSR - Normal EKG  Stress Procedure:  The patient received IV Lexiscan 0.4 mg over 15-seconds.  Technetium 3m Sestamibi injected at 30-seconds.  Quantitative  spect images were obtained after a 45 minute delay.  During the infusion of Lexiscan, the patient complained of SOB and fatigue.  This resolved in recovery.  Stress ECG: No significant ST segment change suggestive of ischemia.  QPS Raw Data Images:  Acquisition technically good; LVE. Stress Images:  There is decreased uptake in the inferior wall. Rest Images:  There is decreased uptake in the inferior wall. Subtraction (SDS):  There is a fixed defect that is most consistent with a previous infarction. Transient Ischemic Dilatation (Normal <1.22):  1.32 Lung/Heart Ratio (Normal <0.45):  0.33  Quantitative Gated Spect Images QGS EDV:  114 ml QGS ESV:  60 ml  Impression Exercise Capacity:  Lexiscan with no exercise. BP Response:  Normal blood pressure response. Clinical Symptoms:  There is dyspnea. ECG Impression:  No significant ST segment change suggestive of ischemia. Comparison with Prior Nuclear Study: Compared to 03/16/05, there is no ischemia on present study.  Overall Impression:  Low risk stress nuclear study with a large, severe intensity, fixed inferior defect consistent with prior infarct; no ischemia.  LV Ejection Fraction: 48%.  LV Wall Motion:  Inferior akinesis.  Kirk Ruths

## 2013-05-18 ENCOUNTER — Other Ambulatory Visit: Payer: Self-pay | Admitting: Cardiovascular Disease

## 2013-05-30 ENCOUNTER — Encounter: Payer: Self-pay | Admitting: Nurse Practitioner

## 2013-05-30 ENCOUNTER — Ambulatory Visit (INDEPENDENT_AMBULATORY_CARE_PROVIDER_SITE_OTHER): Payer: Managed Care, Other (non HMO) | Admitting: Nurse Practitioner

## 2013-05-30 VITALS — BP 150/70 | HR 58 | Ht 71.0 in | Wt 204.4 lb

## 2013-05-30 DIAGNOSIS — I2510 Atherosclerotic heart disease of native coronary artery without angina pectoris: Secondary | ICD-10-CM | POA: Diagnosis not present

## 2013-05-30 MED ORDER — ISOSORBIDE MONONITRATE ER 60 MG PO TB24: 60.0000 mg | ORAL_TABLET | Freq: Every day | ORAL | Status: DC

## 2013-05-30 NOTE — Patient Instructions (Addendum)
Stay on your current medicines but we are going to increase the Imdur to 60 mg a day - this is at the drug store - you make take 2 of your 30 mg tablets to equal this dose to use up.  See Dr. Burt Knack back in 4 weeks.  Use your NTG under your tongue for recurrent chest pain. May take one tablet every 5 minutes. If you are still having discomfort after 3 tablets in 15 minutes, call 911.  Call the [place] office at [phone] if you have any questions, problems or concerns.

## 2013-05-30 NOTE — Progress Notes (Signed)
[name] Date of Birth: 02-04-1951 Medical Record [id number]  History of Present Illness: [name] is seen back today for a follow up visit. Seen for [name]. Former patient of [name]. He has an extensive history of PVD and CAD. He is followed by [name] at [facility] but now seeing Vascular in [facility]. He has had remote cath in 2006 showing normal LV function with a totally occluded RCA with left to right collaterals. He has distal small vessel disease in the RCA with diffuse mild to moderate stenosis in the LAD. Has never had a MI. No longer smoking. His PVD history is quite extensive and includes AAA (with no plans to intervene due to the nature of his aneurysm and his multiple comorbidities).   Other problems include HTN, HLD and OA. He has a history of hyponatremia.   Seen a couple of weeks ago - was having a "heavy" feeling - first he says in his chest - then he says its an all over feeling - worse with exertion - better with rest. No NTG use. May have had some radiation up to the neck. Still with claudication that limits his activity level. Has put his weight back on that he had lost - wife restarted the Norvasc due to increased BP. We updated his Myoview and I started him on Imdur.  Comes back today for discussion. Here with his wife. Seems that his symptoms have improved overall. He did go to the beach and on their last day there, woke up with chest heaviness. Had forgot his NTG. She put an Imdur under his tongue and then had him swallow - got better after 20 minutes. They then drove home. He has had "only little" spells since. He feels like he is better.    Current Outpatient Prescriptions  Medication Sig Dispense Refill  . amLODipine (NORVASC) 2.5 MG tablet Take 2.5 mg by mouth daily.      Marland Kitchen aspirin 81 MG tablet Take 81 mg by mouth daily.        . carvedilol (COREG) 25 MG tablet take 1 and 1/2 tablet by mouth twice a day  90 tablet  5  . cilostazol (PLETAL) 100 MG  tablet Take 100 mg by mouth daily. Twice a week      . clopidogrel (PLAVIX) 75 MG tablet take 1 tablet by mouth once daily  30 tablet  3  . folic acid (FOLVITE) 1 MG tablet Take 1 mg by mouth daily.      . isosorbide mononitrate (IMDUR) 30 MG 24 hr tablet Take 1 tablet (30 mg total) by mouth daily.  30 tablet  3  . lisinopril (PRINIVIL,ZESTRIL) 20 MG tablet take 1 tablet by mouth twice a day  60 tablet  6  . nitroGLYCERIN (NITROSTAT) 0.4 MG SL tablet Place 1 tablet (0.4 mg total) under the tongue every 5 (five) minutes as needed.  25 tablet  6  . ranitidine (ZANTAC) 300 MG tablet Take 1 tablet (300 mg total) by mouth at bedtime.  30 tablet  6  . rosuvastatin (CRESTOR) 20 MG tablet Take 1 tablet (20 mg total) by mouth at bedtime.  30 tablet  5  . Thiamine HCl (VITAMIN B-1) 100 MG tablet Take 100 mg by mouth daily.         No current facility-administered medications for this visit.    Allergies  Allergen Reactions  . Pletal [Cilostazol] Diarrhea    Past Medical History  Diagnosis Date  .  Hyperlipidemia   . Coronary artery disease   . AAA (abdominal aortic aneurysm)   . PAD (peripheral artery disease)   . COPD (chronic obstructive pulmonary disease)   . Raynaud's disease /phenomenon   . Lumbar degenerative disc disease   . Stroke 10/15/2005  . PONV (postoperative nausea and vomiting)   . Hypertension     takes meds daily  . Anxiety   . GERD (gastroesophageal reflux disease)   . History of blood transfusion     Past Surgical History  Procedure Laterality Date  . Iliac artery stent  10/15/2005     Left common and external   . Cardiac catheterization  2006  . Carotid endarterectomy      bilateral  . Carotid endarterectomy  04/13/2005    Right and 06/03/2005 Left  . Abdominal angiogram  03/30/2012  . Hernia repair      History  Smoking status  . Former Smoker  . Types: Cigarettes  . Quit date: 05/24/2005  Smokeless tobacco  . Never Used    History  Alcohol Use  .  14.4 oz/week  . 24 Cans of beer per week    Comment: approximately 24 beer a week    Family History  Problem Relation Age of Onset  . Coronary artery disease Father 49    HTN, Hyperlipidemia died  . Heart disease Father     Heart Disease before age 56  . Hypertension Father   . Hyperlipidemia Father   . Peripheral vascular disease Father   . Other Mother 63     died rheumatoid arthritis  . Dementia Mother   . Heart disease Brother   . Hypertension Brother   . Hyperlipidemia Brother     Review of Systems: The review of systems is per the HPI.  All other systems were reviewed and are negative.  Physical Exam: BP 150/70  Pulse 58  Ht 5\' 11"  (1.803 m)  Wt 204 lb 6.4 oz (92.715 kg)  BMI 28.52 kg/m2  SpO2 97% Patient is very pleasant and in no acute distress. Skin is warm and dry. Color is normal.  HEENT is unremarkable. Normocephalic/atraumatic. PERRL. Sclera are nonicteric. Neck is supple. No masses. No JVD. Lungs are clear. Cardiac exam shows a regular rate and rhythm. Abdomen is soft. Extremities are without edema. Gait and ROM are intact. No gross neurologic deficits noted.  LABORATORY DATA:  Lab Results  Component Value Date   WBC 6.3 05/14/2013   HGB 12.1* 05/14/2013   HCT 34.8* 05/14/2013   PLT 216.0 05/14/2013   GLUCOSE 91 05/14/2013   CHOL 133 05/14/2013   TRIG 53.0 05/14/2013   HDL 73.30 05/14/2013   LDLCALC 49 05/14/2013   ALT 15 05/14/2013   AST 24 05/14/2013   NA 126* 05/14/2013   K 4.6 05/14/2013   CL 92* 05/14/2013   CREATININE 0.8 05/14/2013   BUN 10 05/14/2013   CO2 24 05/14/2013   TSH 0.74 01/12/2013   INR 0.98 05/29/2012    Impression  Exercise Capacity: Lexiscan with no exercise.  BP Response: Normal blood pressure response.  Clinical Symptoms: There is dyspnea.  ECG Impression: No significant ST segment change suggestive of ischemia.  Comparison with Prior Nuclear Study: Compared to 03/16/05, there is no ischemia on present study.  Overall  Impression: Low risk stress nuclear study with a large, severe intensity, fixed inferior defect consistent with prior infarct; no ischemia.  LV Ejection Fraction: 48%. LV Wall Motion: Inferior akinesis.  [name] Ruths  Myoview was reviewed with [name] -  "Cecille Rubin - I looked at his images and old cath report from [facility] in 2006. Looks like he had subtotal occlusion of his RCA and this defect fits with that anatomy. With that said, if he is having chest pain I would probably go ahead and cath him." Ronalee Belts    Assessment / Plan:  1. HTN   2. Hyponatremia - chronic   3. HLD - on statin  4. PVD - now followed in [facility] - sees them next month - no plans for any intervention due to the extensive nature of his AAA  5. CAD - still with some symptoms but seems improved. We discussed cardiac cath - while this could be a challenge given his PVD could probably do a radial approach. He is opting for continued medical therapy at this time- will increase the Imdur to 60 mg - get him to see [name] in 4 weeks.   Patient is agreeable to this plan and will call if any problems develop in the interim.   Burtis Junes, RN, [place] [address] [place], [place]  [address]

## 2013-06-04 ENCOUNTER — Encounter (HOSPITAL_COMMUNITY): Payer: Managed Care, Other (non HMO)

## 2013-06-19 ENCOUNTER — Telehealth: Payer: Self-pay | Admitting: Nurse Practitioner

## 2013-06-19 NOTE — Telephone Encounter (Signed)
S/w pt still expereincing some chest heaviness it comes and goes but it is getting better, pt stated Cecille Rubin told pt at last office visit had plenty of room to increase the imdur, pt wants to know if can go to 90 mg and would I send in a new script if Lori increases. Stated Cecille Rubin left for the day and will get back to me in the am pt stated verbal understanding will call back pt in the am. I will route this message to Truitt Merle, NP

## 2013-06-19 NOTE — Telephone Encounter (Signed)
New message   Patients wife called in and stated patient gets # 66 Nitrogyrcin , She would like to have that changed to # 21. Please call and advise.

## 2013-06-20 ENCOUNTER — Other Ambulatory Visit: Payer: Self-pay | Admitting: *Deleted

## 2013-06-20 MED ORDER — ISOSORBIDE MONONITRATE ER 60 MG PO TB24: 90.0000 mg | ORAL_TABLET | Freq: Every day | ORAL | Status: DC

## 2013-06-20 NOTE — Telephone Encounter (Signed)
Yes, he may increase to 90 mg of Imdur (1 1/2 tabs) - he needs to let me know if his symptoms remain persistent - still may need to proceed with cardiac catheterization.

## 2013-06-20 NOTE — Telephone Encounter (Signed)
S/w pt's wife to let know Imdur was increased to (90 mg ) daily, pt's wife requested a new script sent in, sent to [facility] on [address], Pt's wife stated verbal understanding

## 2013-06-29 ENCOUNTER — Ambulatory Visit (INDEPENDENT_AMBULATORY_CARE_PROVIDER_SITE_OTHER): Payer: Managed Care, Other (non HMO) | Admitting: Cardiovascular Disease

## 2013-06-29 ENCOUNTER — Encounter: Payer: Self-pay | Admitting: Cardiovascular Disease

## 2013-06-29 ENCOUNTER — Encounter (INDEPENDENT_AMBULATORY_CARE_PROVIDER_SITE_OTHER): Payer: Self-pay

## 2013-06-29 VITALS — BP 144/74 | HR 63 | Ht 71.0 in | Wt 204.0 lb

## 2013-06-29 DIAGNOSIS — I208 Other forms of angina pectoris: Secondary | ICD-10-CM

## 2013-06-29 DIAGNOSIS — I209 Angina pectoris, unspecified: Secondary | ICD-10-CM

## 2013-06-29 MED ORDER — AMLODIPINE BESYLATE 5 MG PO TABS: 5.0000 mg | ORAL_TABLET | Freq: Every day | ORAL | Status: DC

## 2013-06-29 NOTE — Progress Notes (Signed)
HPI:  63 year old gentleman presenting for followup evaluation. The patient has an extensive history of peripheral and coronary artery disease. He is followed by vascular surgery at [facility]. Cardiac catheterization in 2006 demonstrated normal left ventricular systolic function with total occlusion of the right coronary artery with left-to-right collaterals. There was mild to moderate LAD stenosis noted and he has been managed medically. Other comorbid conditions include hypertension, hyperlipidemia, and osteoarthritis. He was seen last month by Truitt Merle and described some increasing anginal symptoms. Medical therapy was adjusted and he is now taking Imdur 60 mg. He returns today for followup.  The patient reports some improvement in his anginal symptoms since he has increased isosorbide. He continues to have occasional chest pressure associated with activity. His nitroglycerin use has not changed significantly. He rarely has to use it. He stops to rest and symptoms resolved. He is primarily limited by his legs. He has severe claudication and can only walk very short distances. He has seen Dr. Sammuel Hines at [facility] who has recommended conservative management. He is following with him annually for surveillance of his aneurysm and lower extremity PAD. The patient denies dyspnea, palpitations, orthopnea, or PND.    Outpatient Encounter Prescriptions as of 06/29/2013  Medication Sig  . amLODipine (NORVASC) 2.5 MG tablet Take 2.5 mg by mouth daily.  Marland Kitchen aspirin 81 MG tablet Take 81 mg by mouth daily.    . carvedilol (COREG) 25 MG tablet take 1 and 1/2 tablet by mouth twice a day  . clopidogrel (PLAVIX) 75 MG tablet take 1 tablet by mouth once daily  . folic acid (FOLVITE) 1 MG tablet Take 1 mg by mouth daily.  . isosorbide mononitrate (IMDUR) 60 MG 24 hr tablet Take 1.5 tablets (90 mg total) by mouth daily.  Marland Kitchen lisinopril (PRINIVIL,ZESTRIL) 20 MG tablet take 1 tablet by mouth twice a day  .  nitroGLYCERIN (NITROSTAT) 0.4 MG SL tablet Place 1 tablet (0.4 mg total) under the tongue every 5 (five) minutes as needed.  . ranitidine (ZANTAC) 300 MG tablet Take 300 mg by mouth daily.  . rosuvastatin (CRESTOR) 20 MG tablet Take 1 tablet (20 mg total) by mouth at bedtime.  . Thiamine HCl (VITAMIN B-1) 100 MG tablet Take 100 mg by mouth daily.    . [DISCONTINUED] ranitidine (ZANTAC) 300 MG tablet Take 1 tablet (300 mg total) by mouth at bedtime.  . [DISCONTINUED] cilostazol (PLETAL) 100 MG tablet Take 100 mg by mouth daily. Twice a week    Allergies  Allergen Reactions  . Pletal [Cilostazol] Diarrhea    Past Medical History  Diagnosis Date  . Hyperlipidemia   . Coronary artery disease   . AAA (abdominal aortic aneurysm)   . PAD (peripheral artery disease)   . COPD (chronic obstructive pulmonary disease)   . Raynaud's disease /phenomenon   . Lumbar degenerative disc disease   . Stroke 10/15/2005  . PONV (postoperative nausea and vomiting)   . Hypertension     takes meds daily  . Anxiety   . GERD (gastroesophageal reflux disease)   . History of blood transfusion     ROS: Negative except as per HPI  BP 144/74  Pulse 63  Ht 5\' 11"  (1.803 m)  Wt 204 lb (92.534 kg)  BMI 28.46 kg/m2  SpO2 98%  PHYSICAL EXAM: Pt is alert and oriented, NAD HEENT: normal Neck: JVP - normal, carotids 2+= with soft bilateral bruits Lungs: CTA bilaterally CV: RRR without murmur or gallop Abd: soft,  NT, Positive BS, no hepatomegaly Ext: Trace edema Skin: warm/dry no rash  ASSESSMENT AND PLAN: 1. Coronary atherosclerosis, native vessel. Coronary anatomy as outlined above with known chronic occlusion of his RCA and mild to moderate LAD stenosis at previous cardiac catheterization several years ago. He has CCS class 2-3 anginal symptoms. Symptoms have stabilized and even improved some with increase in isosorbide. I recommended that he increase his amlodipine to 5 mg daily. I would like him to  follow back up with Cecille Rubin in 2 months. He has easily palpable radial pulses bilaterally and I think even with his extensive vascular disease and he would be a reasonable candidate for cardiac catheterization via a radial approach if symptoms progress. We had extensive discussion about signs to watch for, including progressive angina. He understands to call 911 if severe resting symptoms were to occur.  2. Lower extremity peripheral arterial disease with severe intermittent claudication. Followed by Dr. Sammuel Hines. Unable to tolerate cilostazol.  3. Hypertension. Amlodipine increased as above.  [name] 06/29/2013 9:31 AM

## 2013-06-29 NOTE — Patient Instructions (Addendum)
Your physician has recommended you make the following change in your medication:  INCREASE Norvasc to 5 mg Daily  Your physician recommends that you schedule a follow-up appointment in: 2 months with Truitt Merle, NP Future appointments will be with Cecille Rubin unless patient needs intervention with Dr. Burt Knack

## 2013-07-06 ENCOUNTER — Other Ambulatory Visit: Payer: Self-pay | Admitting: Vascular Surgery

## 2013-07-06 DIAGNOSIS — I6529 Occlusion and stenosis of unspecified carotid artery: Secondary | ICD-10-CM

## 2013-07-06 DIAGNOSIS — Z48812 Encounter for surgical aftercare following surgery on the circulatory system: Secondary | ICD-10-CM

## 2013-07-20 ENCOUNTER — Other Ambulatory Visit: Payer: Self-pay | Admitting: Cardiovascular Disease

## 2013-07-26 DIAGNOSIS — J9819 Other pulmonary collapse: Secondary | ICD-10-CM | POA: Diagnosis not present

## 2013-07-26 DIAGNOSIS — Z01811 Encounter for preprocedural respiratory examination: Secondary | ICD-10-CM | POA: Diagnosis not present

## 2013-07-26 DIAGNOSIS — K4090 Unilateral inguinal hernia, without obstruction or gangrene, not specified as recurrent: Secondary | ICD-10-CM | POA: Diagnosis not present

## 2013-07-26 DIAGNOSIS — J438 Other emphysema: Secondary | ICD-10-CM | POA: Diagnosis not present

## 2013-07-26 DIAGNOSIS — I7772 Dissection of iliac artery: Secondary | ICD-10-CM | POA: Diagnosis not present

## 2013-07-26 DIAGNOSIS — I70209 Unspecified atherosclerosis of native arteries of extremities, unspecified extremity: Secondary | ICD-10-CM | POA: Diagnosis not present

## 2013-07-26 DIAGNOSIS — K429 Umbilical hernia without obstruction or gangrene: Secondary | ICD-10-CM | POA: Diagnosis not present

## 2013-07-26 DIAGNOSIS — I716 Thoracoabdominal aortic aneurysm, without rupture, unspecified: Secondary | ICD-10-CM | POA: Diagnosis not present

## 2013-07-26 DIAGNOSIS — I714 Abdominal aortic aneurysm, without rupture, unspecified: Secondary | ICD-10-CM | POA: Diagnosis not present

## 2013-07-26 DIAGNOSIS — M47817 Spondylosis without myelopathy or radiculopathy, lumbosacral region: Secondary | ICD-10-CM | POA: Diagnosis not present

## 2013-07-26 DIAGNOSIS — I745 Embolism and thrombosis of iliac artery: Secondary | ICD-10-CM | POA: Diagnosis not present

## 2013-07-26 DIAGNOSIS — I723 Aneurysm of iliac artery: Secondary | ICD-10-CM | POA: Diagnosis not present

## 2013-08-09 DIAGNOSIS — I10 Essential (primary) hypertension: Secondary | ICD-10-CM | POA: Diagnosis not present

## 2013-08-09 DIAGNOSIS — I716 Thoracoabdominal aortic aneurysm, without rupture, unspecified: Secondary | ICD-10-CM | POA: Diagnosis not present

## 2013-08-09 DIAGNOSIS — I714 Abdominal aortic aneurysm, without rupture, unspecified: Secondary | ICD-10-CM | POA: Diagnosis not present

## 2013-08-09 DIAGNOSIS — Z8679 Personal history of other diseases of the circulatory system: Secondary | ICD-10-CM | POA: Diagnosis not present

## 2013-08-09 DIAGNOSIS — I6529 Occlusion and stenosis of unspecified carotid artery: Secondary | ICD-10-CM | POA: Diagnosis not present

## 2013-08-09 DIAGNOSIS — I739 Peripheral vascular disease, unspecified: Secondary | ICD-10-CM | POA: Diagnosis not present

## 2013-08-09 DIAGNOSIS — I771 Stricture of artery: Secondary | ICD-10-CM | POA: Diagnosis not present

## 2013-08-09 DIAGNOSIS — Z87891 Personal history of nicotine dependence: Secondary | ICD-10-CM | POA: Diagnosis not present

## 2013-08-09 DIAGNOSIS — E785 Hyperlipidemia, unspecified: Secondary | ICD-10-CM | POA: Diagnosis not present

## 2013-08-19 ENCOUNTER — Other Ambulatory Visit: Payer: Self-pay | Admitting: Cardiovascular Disease

## 2013-08-27 ENCOUNTER — Ambulatory Visit: Payer: Managed Care, Other (non HMO) | Admitting: Nurse Practitioner

## 2013-09-05 ENCOUNTER — Other Ambulatory Visit: Payer: Self-pay | Admitting: Cardiovascular Disease

## 2013-09-10 ENCOUNTER — Ambulatory Visit (INDEPENDENT_AMBULATORY_CARE_PROVIDER_SITE_OTHER): Payer: Medicare Other | Admitting: Nurse Practitioner

## 2013-09-10 ENCOUNTER — Encounter: Payer: Self-pay | Admitting: Nurse Practitioner

## 2013-09-10 VITALS — BP 120/80 | HR 61 | Ht 71.0 in | Wt 208.8 lb

## 2013-09-10 DIAGNOSIS — I2510 Atherosclerotic heart disease of native coronary artery without angina pectoris: Secondary | ICD-10-CM

## 2013-09-10 DIAGNOSIS — I209 Angina pectoris, unspecified: Secondary | ICD-10-CM

## 2013-09-10 DIAGNOSIS — I6529 Occlusion and stenosis of unspecified carotid artery: Secondary | ICD-10-CM

## 2013-09-10 DIAGNOSIS — I6523 Occlusion and stenosis of bilateral carotid arteries: Secondary | ICD-10-CM

## 2013-09-10 DIAGNOSIS — R079 Chest pain, unspecified: Secondary | ICD-10-CM

## 2013-09-10 DIAGNOSIS — I208 Other forms of angina pectoris: Secondary | ICD-10-CM

## 2013-09-10 DIAGNOSIS — I10 Essential (primary) hypertension: Secondary | ICD-10-CM

## 2013-09-10 DIAGNOSIS — I739 Peripheral vascular disease, unspecified: Secondary | ICD-10-CM | POA: Diagnosis not present

## 2013-09-10 DIAGNOSIS — I658 Occlusion and stenosis of other precerebral arteries: Secondary | ICD-10-CM | POA: Diagnosis not present

## 2013-09-10 NOTE — Progress Notes (Signed)
[name] Date of Birth: [birth year] Medical Record [id number]  History of Present Illness: [name] is seen back today for a follow up visit. This is a 2 month check. Seen for Dr. Burt Best. Former patient of Dr. Susa Simmonds. He has an extensive history of PVD and CAD. He is followed by Dr. Bridgett Larsson at [facility] but now seeing Vascular in [facility]. He has had remote cath in 2006 showing normal LV function with a totally occluded RCA with left to right collaterals. He has distal small vessel disease in the RCA with diffuse mild to moderate stenosis in the LAD. Has never had a MI. No longer smoking. His PVD history is quite extensive and includes AAA (with no plans to intervene due to the nature of his aneurysm and his multiple comorbidities).   Other problems include HTN, HLD and OA. He has a history of hyponatremia.   Seen back earlier this year by me and lastly by Dr. Burt Best. Has CCS class 2-3 symptoms. Some improvement with long acting nitrate therapy. Had his Norvasc increased at his last visit. [name] has wished to avoid cardiac catheterization and has opted for medical management. However, he has had easily palpable radial pulses and Dr. Burt Best feels a radial cath could be undertaken despite his extensive PVD.   Comes back today. Here with his wife. Doing ok. Notes that with his medical regimen, he has "no more strong chest pain/heaviness" does have some "twinges" but overall he thinks he is stable. Has recently been to [facility] for 2 days of testing with labs. Was told that his vascular situation was unchanged - his AAA has grown and is now 5. Still no plans for surgical intervention. Notes that he has some swelling in his feet and tingling. Does not wish to cut back his medicines (Norvasc) which I suspect is contributing. His vascular issues are his most limiting factor.   Current Outpatient Prescriptions  Medication Sig Dispense Refill  . amLODipine (NORVASC) 5 MG tablet Take 1 tablet (5 mg  total) by mouth daily.  90 tablet  3  . aspirin 81 MG tablet Take 81 mg by mouth daily.        . carvedilol (COREG) 25 MG tablet take 1 and 1/2 tablet by mouth twice a day  90 tablet  1  . clopidogrel (PLAVIX) 75 MG tablet take 1 tablet by mouth once daily  30 tablet  3  . isosorbide mononitrate (IMDUR) 60 MG 24 hr tablet Take 1.5 tablets (90 mg total) by mouth daily.  90 tablet  3  . lisinopril (PRINIVIL,ZESTRIL) 20 MG tablet take 1 tablet by mouth twice a day  60 tablet  6  . nitroGLYCERIN (NITROSTAT) 0.4 MG SL tablet Place 1 tablet (0.4 mg total) under the tongue every 5 (five) minutes as needed.  25 tablet  6  . ranitidine (ZANTAC) 300 MG tablet Take 300 mg by mouth daily.      . rosuvastatin (CRESTOR) 20 MG tablet Take 1 tablet (20 mg total) by mouth at bedtime.  30 tablet  5  . Thiamine HCl (VITAMIN B-1) 100 MG tablet Take 100 mg by mouth daily.         No current facility-administered medications for this visit.    Allergies  Allergen Reactions  . Pletal [Cilostazol] Diarrhea    Past Medical History  Diagnosis Date  . Hyperlipidemia   . Coronary artery disease   . AAA (abdominal aortic aneurysm)   . PAD (peripheral  artery disease)   . COPD (chronic obstructive pulmonary disease)   . Raynaud's disease /phenomenon   . Lumbar degenerative disc disease   . Stroke 10/15/2005  . PONV (postoperative nausea and vomiting)   . Hypertension     takes meds daily  . Anxiety   . GERD (gastroesophageal reflux disease)   . History of blood transfusion     Past Surgical History  Procedure Laterality Date  . Iliac artery stent  10/15/2005     Left common and external   . Cardiac catheterization  2006  . Carotid endarterectomy      bilateral  . Carotid endarterectomy  04/13/2005    Right and 06/03/2005 Left  . Abdominal angiogram  03/30/2012  . Hernia repair      History  Smoking status  . Former Smoker  . Types: Cigarettes  . Quit date: 05/24/2005  Smokeless tobacco  . Never  Used    History  Alcohol Use  . 14.4 oz/week  . 24 Cans of beer per week    Comment: approximately 24 beer a week    Family History  Problem Relation Age of Onset  . Coronary artery disease Father 62    HTN, Hyperlipidemia died  . Heart disease Father     Heart Disease before age 95  . Hypertension Father   . Hyperlipidemia Father   . Peripheral vascular disease Father   . Other Mother 54     died rheumatoid arthritis  . Dementia Mother   . Heart disease Brother   . Hypertension Brother   . Hyperlipidemia Brother     Review of Systems: The review of systems is per the HPI.  All other systems were reviewed and are negative.  Physical Exam: BP 120/80  Ht 5\' 11"  (1.803 m)  Wt 208 lb 12.8 oz (94.711 kg)  BMI 29.13 kg/m2 Patient is very pleasant and in no acute distress. Skin is warm and dry. Color is normal.  HEENT is unremarkable. Normocephalic/atraumatic. PERRL. Sclera are nonicteric. Neck is supple. No masses. No JVD. Lungs are clear. Cardiac exam shows a regular rate and rhythm. Abdomen is soft. Extremities are without edema. Gait and ROM are intact. No gross neurologic deficits noted.  LABORATORY DATA: EKG today shows sinus rhythm.   Lab Results  Component Value Date   WBC 6.3 05/14/2013   HGB 12.1* 05/14/2013   HCT 34.8* 05/14/2013   PLT 216.0 05/14/2013   GLUCOSE 91 05/14/2013   CHOL 133 05/14/2013   TRIG 53.0 05/14/2013   HDL 73.30 05/14/2013   LDLCALC 49 05/14/2013   ALT 15 05/14/2013   AST 24 05/14/2013   NA 126* 05/14/2013   K 4.6 05/14/2013   CL 92* 05/14/2013   CREATININE 0.8 05/14/2013   BUN 10 05/14/2013   CO2 24 05/14/2013   TSH 0.74 01/12/2013   INR 0.98 05/29/2012   Myoview Impression from December 2014  Exercise Capacity: Lexiscan with no exercise.  BP Response: Normal blood pressure response.  Clinical Symptoms: There is dyspnea.  ECG Impression: No significant ST segment change suggestive of ischemia.  Comparison with Prior Nuclear  Study: Compared to 03/16/05, there is no ischemia on present study.  Overall Impression: Low risk stress nuclear study with a large, severe intensity, fixed inferior defect consistent with prior infarct; no ischemia.  LV Ejection Fraction: 48%. LV Wall Motion: Inferior akinesis.  [name]   Myoview was reviewed with Dr. Burt Best -  "Cecille Rubin - I looked at his images  and old cath report from [facility] in 2006. Looks like he had subtotal occlusion of his RCA and this defect fits with that anatomy. With that said, if he is having chest pain I would probably go ahead and cath him." [name]     Assessment / Plan: 1. CAD with stable angina - he wishes to continue with his current medical regimen. I have reminded him that the option of cardiac cath was still "on the table".    2. Extensive PVD - followed by Dr. Sammuel Hines at [facility] with recent evaluation. I tried to obtain records thru [facility] but with no luck.   3. HTN - BP looks great today.   4. HLD - on statin therapy  See him back in 4 months. Check labs on return. No change in his current regimen.  Patient is agreeable to this plan and will call if any problems develop in the interim.   Burtis Junes, RN, [place] [address] [place], [place]  [address] [phone]

## 2013-09-10 NOTE — Patient Instructions (Addendum)
Stay on your current medicines  I will see you in 4 months  Call the [place] office at [phone] if you have any questions, problems or concerns.

## 2013-09-21 ENCOUNTER — Other Ambulatory Visit (HOSPITAL_COMMUNITY): Payer: Managed Care, Other (non HMO)

## 2013-09-21 ENCOUNTER — Ambulatory Visit: Payer: Managed Care, Other (non HMO) | Admitting: Vascular Surgery

## 2013-09-21 ENCOUNTER — Other Ambulatory Visit: Payer: Self-pay | Admitting: Cardiovascular Disease

## 2013-09-25 ENCOUNTER — Other Ambulatory Visit: Payer: Self-pay | Admitting: *Deleted

## 2013-09-25 MED ORDER — LISINOPRIL 20 MG PO TABS: ORAL_TABLET | ORAL | Status: DC

## 2013-10-05 ENCOUNTER — Other Ambulatory Visit (HOSPITAL_COMMUNITY): Payer: Managed Care, Other (non HMO)

## 2013-10-05 ENCOUNTER — Ambulatory Visit: Payer: Managed Care, Other (non HMO) | Admitting: Vascular Surgery

## 2013-10-12 ENCOUNTER — Other Ambulatory Visit: Payer: Self-pay | Admitting: Cardiovascular Disease

## 2013-11-14 DIAGNOSIS — R221 Localized swelling, mass and lump, neck: Secondary | ICD-10-CM | POA: Diagnosis not present

## 2013-11-14 DIAGNOSIS — R220 Localized swelling, mass and lump, head: Secondary | ICD-10-CM | POA: Diagnosis not present

## 2013-11-30 ENCOUNTER — Other Ambulatory Visit: Payer: Self-pay | Admitting: Cardiovascular Disease

## 2013-12-04 ENCOUNTER — Other Ambulatory Visit: Payer: Self-pay

## 2013-12-04 MED ORDER — ROSUVASTATIN CALCIUM 20 MG PO TABS: 20.0000 mg | ORAL_TABLET | Freq: Every day | ORAL | Status: DC

## 2014-01-08 ENCOUNTER — Encounter: Payer: Self-pay | Admitting: Nurse Practitioner

## 2014-01-08 ENCOUNTER — Ambulatory Visit (INDEPENDENT_AMBULATORY_CARE_PROVIDER_SITE_OTHER): Payer: Medicare Other | Admitting: Nurse Practitioner

## 2014-01-08 VITALS — BP 122/74 | HR 67 | Ht 71.0 in | Wt 211.0 lb

## 2014-01-08 DIAGNOSIS — I25810 Atherosclerosis of coronary artery bypass graft(s) without angina pectoris: Secondary | ICD-10-CM

## 2014-01-08 DIAGNOSIS — I2510 Atherosclerotic heart disease of native coronary artery without angina pectoris: Secondary | ICD-10-CM

## 2014-01-08 DIAGNOSIS — E785 Hyperlipidemia, unspecified: Secondary | ICD-10-CM | POA: Diagnosis not present

## 2014-01-08 LAB — BASIC METABOLIC PANEL
BUN: 8 mg/dL (ref 6–23)
CO2: 26 mEq/L (ref 19–32)
Calcium: 9.1 mg/dL (ref 8.4–10.5)
Chloride: 94 mEq/L — ABNORMAL LOW (ref 96–112)
Creatinine, Ser: 0.9 mg/dL (ref 0.4–1.5)
GFR: 89.87 mL/min (ref >60.00)
Glucose, Bld: 107 mg/dL — ABNORMAL HIGH (ref 70–99)
Potassium: 4.7 mEq/L (ref 3.5–5.1)
Sodium: 130 mEq/L — ABNORMAL LOW (ref 135–145)

## 2014-01-08 LAB — HEPATIC FUNCTION PANEL
ALT: 16 U/L (ref 0–53)
AST: 27 U/L (ref 0–37)
Albumin: 3.9 g/dL (ref 3.5–5.2)
Alkaline Phosphatase: 61 U/L (ref 39–117)
Bilirubin, Direct: 0.1 mg/dL (ref 0.0–0.3)
Total Bilirubin: 1.1 mg/dL (ref 0.2–1.2)
Total Protein: 7.1 g/dL (ref 6.0–8.3)

## 2014-01-08 LAB — LIPID PANEL
Cholesterol: 133 mg/dL (ref 0–200)
HDL: 78 mg/dL (ref >39.00)
LDL Cholesterol: 43 mg/dL (ref 0–99)
NonHDL: 55
Total CHOL/HDL Ratio: 2
Triglycerides: 60 mg/dL (ref 0.0–149.0)
VLDL: 12 mg/dL (ref 0.0–40.0)

## 2014-01-08 NOTE — Patient Instructions (Addendum)
Stay on your current medicines  We will check labs today  I will see you in 4 months  Call the [place] office at [phone] if you have any questions, problems or concerns.

## 2014-01-08 NOTE — Progress Notes (Signed)
[name] Date of Birth: April 03, 1951 Medical Record [id number]  History of Present Illness: [name] is seen back today for a follow up visit. This is a 4 month check. Seen for Dr. Burt Knack. Former patient of Dr. Susa Simmonds. He has an extensive history of PVD and CAD. He is followed by Dr. Bridgett Larsson at [facility] but now seeing Vascular in [facility]. He has had remote cath in 2006 showing normal LV function with a totally occluded RCA with left to right collaterals. He has distal small vessel disease in the RCA with diffuse mild to moderate stenosis in the LAD. Has never had a MI. No longer smoking. His PVD history is quite extensive and includes AAA (with no plans to intervene due to the nature of his aneurysm and his multiple comorbidities).   Other problems include HTN, HLD and OA. He has a history of hyponatremia.   Seen several times this year me and by Dr. Burt Knack. Has had CCS class 2-3 symptoms. Some improvement with long acting nitrate therapy. Had his Norvasc increased as well. [name] has wished to avoid cardiac catheterization and has opted for medical management. However, he has had easily palpable radial pulses and Dr. Burt Knack feels a radial cath could be undertaken despite his extensive PVD.   Seen back in April - was ok but still with symptoms - also with some swelling (probably from his medicines) - did not wish to cut his medicines back. Had been down to [name] - AAA was at 5 - no plan for surgical intervention.   Comes back today. Here with his wife. He has been doing ok. Pretty sedentary. Still mostly limited by his vascular issues. Very little angina - no sl NTG used. Tolerating his medicines. Does have some issues with anxiety. Has been given 2 pills of Xanax in the past for flights. They are no longer traveling out of the country. Planning on going to [place] in October.   Current Outpatient Prescriptions  Medication Sig Dispense Refill  . amLODipine (NORVASC) 5 MG tablet  Take 1 tablet (5 mg total) by mouth daily.  90 tablet  3  . aspirin 81 MG tablet Take 81 mg by mouth daily.        . carvedilol (COREG) 25 MG tablet take 1 and 1/2 tablets by mouth twice a day  90 tablet  6  . clopidogrel (PLAVIX) 75 MG tablet take 1 tablet by mouth once daily  30 tablet  3  . isosorbide mononitrate (IMDUR) 60 MG 24 hr tablet Take 1.5 tablets (90 mg total) by mouth daily.  90 tablet  3  . lisinopril (PRINIVIL,ZESTRIL) 20 MG tablet take 1 tablet by mouth twice a day  60 tablet  3  . nitroGLYCERIN (NITROSTAT) 0.4 MG SL tablet Place 1 tablet (0.4 mg total) under the tongue every 5 (five) minutes as needed.  25 tablet  6  . ranitidine (ZANTAC) 300 MG tablet Take 300 mg by mouth daily.      . rosuvastatin (CRESTOR) 20 MG tablet Take 1 tablet (20 mg total) by mouth at bedtime.  30 tablet  5  . Thiamine HCl (VITAMIN B-1) 100 MG tablet Take 100 mg by mouth daily.         No current facility-administered medications for this visit.    Allergies  Allergen Reactions  . Pletal [Cilostazol] Diarrhea    Past Medical History  Diagnosis Date  . Hyperlipidemia   . Coronary artery disease   .  AAA (abdominal aortic aneurysm)   . PAD (peripheral artery disease)   . COPD (chronic obstructive pulmonary disease)   . Raynaud's disease /phenomenon   . Lumbar degenerative disc disease   . Stroke 10/15/2005  . PONV (postoperative nausea and vomiting)   . Hypertension     takes meds daily  . Anxiety   . GERD (gastroesophageal reflux disease)   . History of blood transfusion     Past Surgical History  Procedure Laterality Date  . Iliac artery stent  10/15/2005     Left common and external   . Cardiac catheterization  2006  . Carotid endarterectomy      bilateral  . Carotid endarterectomy  04/13/2005    Right and 06/03/2005 Left  . Abdominal angiogram  03/30/2012  . Hernia repair      History  Smoking status  . Former Smoker  . Types: Cigarettes  . Quit date: 05/24/2005    Smokeless tobacco  . Never Used    History  Alcohol Use  . 14.4 oz/week  . 24 Cans of beer per week    Comment: approximately 24 beer a week    Family History  Problem Relation Age of Onset  . Coronary artery disease Father 34    HTN, Hyperlipidemia died  . Heart disease Father     Heart Disease before age 61  . Hypertension Father   . Hyperlipidemia Father   . Peripheral vascular disease Father   . Other Mother 54     died rheumatoid arthritis  . Dementia Mother   . Heart disease Brother   . Hypertension Brother   . Hyperlipidemia Brother     Review of Systems: The review of systems is per the HPI.  All other systems were reviewed and are negative.  Physical Exam: BP 122/74  Pulse 67  Ht 5\' 11"  (1.803 m)  Wt 211 lb (95.709 kg)  BMI 29.44 kg/m2 Patient is very pleasant and in no acute distress. Skin is warm and dry. Color is normal.  HEENT is unremarkable. Normocephalic/atraumatic. PERRL. Sclera are nonicteric. Neck is supple. No masses. No JVD. Lungs are clear. Cardiac exam shows a regular rate and rhythm. Abdomen is soft. Extremities are without edema. Gait and ROM are intact. No gross neurologic deficits noted.  Wt Readings from Last 3 Encounters:  01/08/14 211 lb (95.709 kg)  09/10/13 208 lb 12.8 oz (94.711 kg)  06/29/13 204 lb (92.534 kg)    LABORATORY DATA/PROCEDURES:  Lab Results  Component Value Date   WBC 6.3 05/14/2013   HGB 12.1* 05/14/2013   HCT 34.8* 05/14/2013   PLT 216.0 05/14/2013   GLUCOSE 91 05/14/2013   CHOL 133 05/14/2013   TRIG 53.0 05/14/2013   HDL 73.30 05/14/2013   LDLCALC 49 05/14/2013   ALT 15 05/14/2013   AST 24 05/14/2013   NA 126* 05/14/2013   K 4.6 05/14/2013   CL 92* 05/14/2013   CREATININE 0.8 05/14/2013   BUN 10 05/14/2013   CO2 24 05/14/2013   TSH 0.74 01/12/2013   INR 0.98 05/29/2012    BNP (last 3 results) No results found for this basename: PROBNP,  in the last 8760 hours  Myoview Impression from December  2014  Exercise Capacity: [place] with no exercise.  BP Response: Normal blood pressure response.  Clinical Symptoms: There is dyspnea.  ECG Impression: No significant ST segment change suggestive of ischemia.  Comparison with Prior Nuclear Study: Compared to 03/16/05, there is no ischemia on present  study.  Overall Impression: Low risk stress nuclear study with a large, severe intensity, fixed inferior defect consistent with prior infarct; no ischemia.  LV Ejection Fraction: 48%. LV Wall Motion: Inferior akinesis.  Kirk Ruths   Myoview was reviewed with Dr. Burt Knack -  "Cecille Rubin - I looked at his images and old cath report from [facility] in 2006. Looks like he had subtotal occlusion of his RCA and this defect fits with that anatomy. With that said, if he is having chest pain I would probably go ahead and cath him." Ronalee Belts   Assessment / Plan:  1. CAD with stable angina - he wishes to continue with his current medical regimen. I have reminded him that the option of cardiac cath was still "on the table". For now, no change in therapy. See back in 4 months.   2. Extensive PVD - followed by Dr. Sammuel Hines at [facility] with recent evaluation. Last OV with Dr. Sammuel Hines from [facility] reviewed today in [facility].  3. HTN - BP looks great today.   4. HLD - on statin therapy - recheck labs today.   See back in 4 months. Ok to repeat the Xanax 2 pills if needed to fly.   Patient is agreeable to this plan and will call if any problems develop in the interim.   Burtis Junes, RN, [place] [address] [place], [place]  [address] [phone]

## 2014-02-03 ENCOUNTER — Other Ambulatory Visit: Payer: Self-pay | Admitting: Cardiovascular Disease

## 2014-02-23 ENCOUNTER — Other Ambulatory Visit: Payer: Self-pay | Admitting: Cardiovascular Disease

## 2014-03-11 ENCOUNTER — Other Ambulatory Visit: Payer: Self-pay | Admitting: Cardiovascular Disease

## 2014-05-01 DIAGNOSIS — M549 Dorsalgia, unspecified: Secondary | ICD-10-CM | POA: Diagnosis not present

## 2014-05-02 ENCOUNTER — Encounter (HOSPITAL_COMMUNITY): Payer: Self-pay | Admitting: Vascular Surgery

## 2014-05-08 ENCOUNTER — Other Ambulatory Visit: Payer: Self-pay | Admitting: *Deleted

## 2014-05-08 ENCOUNTER — Ambulatory Visit (INDEPENDENT_AMBULATORY_CARE_PROVIDER_SITE_OTHER): Payer: Medicare Other | Admitting: Nurse Practitioner

## 2014-05-08 ENCOUNTER — Encounter: Payer: Self-pay | Admitting: Nurse Practitioner

## 2014-05-08 VITALS — BP 118/70 | HR 69 | Ht 70.0 in | Wt 209.8 lb

## 2014-05-08 DIAGNOSIS — I2510 Atherosclerotic heart disease of native coronary artery without angina pectoris: Secondary | ICD-10-CM

## 2014-05-08 DIAGNOSIS — E785 Hyperlipidemia, unspecified: Secondary | ICD-10-CM

## 2014-05-08 DIAGNOSIS — I10 Essential (primary) hypertension: Secondary | ICD-10-CM | POA: Diagnosis not present

## 2014-05-08 DIAGNOSIS — I25810 Atherosclerosis of coronary artery bypass graft(s) without angina pectoris: Secondary | ICD-10-CM

## 2014-05-08 DIAGNOSIS — I739 Peripheral vascular disease, unspecified: Secondary | ICD-10-CM | POA: Diagnosis not present

## 2014-05-08 DIAGNOSIS — I208 Other forms of angina pectoris: Secondary | ICD-10-CM | POA: Diagnosis not present

## 2014-05-08 MED ORDER — ISOSORBIDE MONONITRATE ER 60 MG PO TB24: 90.0000 mg | ORAL_TABLET | Freq: Every day | ORAL | Status: DC

## 2014-05-08 NOTE — Patient Instructions (Signed)
Stay on your current medicines  See me in 6 months  Call the [facility] office at [phone] if you have any questions, problems or concerns.   

## 2014-05-08 NOTE — Progress Notes (Signed)
[name] Date of Birth: July 15, 1950 Medical Record [id number]  History of Present Illness: [name] is seen back today for a follow up visit. This is a 4 month check. Seen for Dr. Burt Knack. Former patient of Dr. Susa Simmonds. He has an extensive history of PVD and CAD. He is followed by Dr. Bridgett Larsson at [facility] but now seeing Vascular in [facility]. He has had remote cath in 2006 showing normal LV function with a totally occluded RCA with left to right collaterals. He has distal small vessel disease in the RCA with diffuse mild to moderate stenosis in the LAD. Has never had a MI. No longer smoking. His PVD history is quite extensive and includes AAA (with no plans to intervene due to the nature of his aneurysm and his multiple comorbidities).   Other problems include HTN, HLD and OA. He has a history of hyponatremia.   Seen several times this year me and by Dr. Burt Knack. Has had CCS class 2-3 symptoms. Some improvement with long acting nitrate therapy. Had his Norvasc increased as well. [name] has wished to avoid cardiac catheterization and has opted for medical management. However, he has had easily palpable radial pulses and Dr. Burt Knack feels a radial cath could be undertaken despite his extensive PVD.   Seen back in April - was ok but still with symptoms - also with some swelling (probably from his medicines) - did not wish to cut his medicines back. Had been down to [facility] - AAA was at 5 - no plan for surgical intervention.   Last seen in August - stable situation. Very little angina.   Comes back today. Here with his wife. Pulled a muscle in his back last week but otherwise ok. No real chest pain. Breathing ok. He is actually using less NTG. They have been to [facility] and he did pretty well. They are both pretty happy with how things are going.   Current Outpatient Prescriptions  Medication Sig Dispense Refill  . amLODipine (NORVASC) 5 MG tablet Take 1 tablet (5 mg total) by mouth daily.  90 tablet 3  . aspirin 81 MG tablet Take 81 mg by mouth daily.      . carvedilol (COREG) 25 MG tablet take 1 and 1/2 tablets by mouth twice a day 90 tablet 6  . clopidogrel (PLAVIX) 75 MG tablet take 1 tablet by mouth once daily 90 tablet 1  . cyclobenzaprine (FLEXERIL) 5 MG tablet Take 5 mg by mouth 3 (three) times daily as needed.   0  . isosorbide mononitrate (IMDUR) 60 MG 24 hr tablet Take 1.5 tablets (90 mg total) by mouth daily. 90 tablet 3  . lisinopril (PRINIVIL,ZESTRIL) 20 MG tablet take 1 tablet by mouth twice a day 60 tablet 3  . nitroGLYCERIN (NITROSTAT) 0.4 MG SL tablet Place 1 tablet (0.4 mg total) under the tongue every 5 (five) minutes as needed. 25 tablet 6  . ranitidine (ZANTAC) 300 MG tablet take 1 tablet by mouth at bedtime 30 tablet 3  . rosuvastatin (CRESTOR) 20 MG tablet Take 1 tablet (20 mg total) by mouth at bedtime. 30 tablet 5  . Thiamine HCl (VITAMIN B-1) 100 MG tablet Take 100 mg by mouth daily.      . traMADol (ULTRAM) 50 MG tablet Take 50 mg by mouth every 6 (six) hours as needed.   0   No current facility-administered medications for this visit.    Allergies  Allergen Reactions  . Pletal [Cilostazol]  Diarrhea    Past Medical History  Diagnosis Date  . Hyperlipidemia   . Coronary artery disease   . AAA (abdominal aortic aneurysm)   . PAD (peripheral artery disease)   . COPD (chronic obstructive pulmonary disease)   . Raynaud's disease /phenomenon   . Lumbar degenerative disc disease   . Stroke 10/15/2005  . PONV (postoperative nausea and vomiting)   . Hypertension     takes meds daily  . Anxiety   . GERD (gastroesophageal reflux disease)   . History of blood transfusion     Past Surgical History  Procedure Laterality Date  . Iliac artery stent  10/15/2005     Left common and external   . Cardiac catheterization  2006  . Carotid endarterectomy      bilateral  . Carotid endarterectomy  04/13/2005    Right and 06/03/2005 Left  . Abdominal  angiogram  03/30/2012  . Hernia repair    . Abdominal aortagram N/A 03/30/2012    Procedure: ABDOMINAL Maxcine Ham;  Surgeon: Conrad [place], MD;  [facility] C[facility] LAB;  Service: Cardiovascular;  Laterality: N/A;    History  Smoking status  . Former Smoker  . Types: Cigarettes  . Quit date: 05/24/2005  Smokeless tobacco  . Never Used    History  Alcohol Use  . 14.4 oz/week  . 24 Cans of beer per week    Comment: approximately 24 beer a week    Family History  Problem Relation Age of Onset  . Coronary artery disease Father 54    HTN, Hyperlipidemia died  . Heart disease Father     Heart Disease before age 71  . Hypertension Father   . Hyperlipidemia Father   . Peripheral vascular disease Father   . Other Mother 23     died rheumatoid arthritis  . Dementia Mother   . Heart disease Brother   . Hypertension Brother   . Hyperlipidemia Brother     Review of Systems: The review of systems is per the HPI.  All other systems were reviewed and are negative.  Physical Exam: BP 118/70 mmHg  Pulse 69  Ht 5\' 10"  (1.778 m)  Wt 209 lb 12.8 oz (95.165 kg)  BMI 30.10 kg/m2  SpO2 93% Patient is very pleasant and in no acute distress. Skin is warm and dry. Color is normal.  HEENT is unremarkable. Normocephalic/atraumatic. PERRL. Sclera are nonicteric. Neck is supple. No masses. No JVD. Lungs are clear. Cardiac exam shows a regular rate and rhythm. Abdomen is soft. Extremities are without edema. Gait and ROM are intact. No gross neurologic deficits noted.  Wt Readings from Last 3 Encounters:  05/08/14 209 lb 12.8 oz (95.165 kg)  01/08/14 211 lb (95.709 kg)  09/10/13 208 lb 12.8 oz (94.711 kg)    LABORATORY DATA/PROCEDURES:  Lab Results  Component Value Date   WBC 6.3 05/14/2013   HGB 12.1* 05/14/2013   HCT 34.8* 05/14/2013   PLT 216.0 05/14/2013   GLUCOSE 107* 01/08/2014   CHOL 133 01/08/2014   TRIG 60.0 01/08/2014   HDL 78.00 01/08/2014   LDLCALC 43 01/08/2014   ALT 16  01/08/2014   AST 27 01/08/2014   NA 130* 01/08/2014   K 4.7 01/08/2014   CL 94* 01/08/2014   CREATININE 0.9 01/08/2014   BUN 8 01/08/2014   CO2 26 01/08/2014   TSH 0.74 01/12/2013   INR 0.98 05/29/2012    BNP (last 3 results) No results for input(s): PROBNP in the  last 8760 hours.  Myoview Impression from December 2014  Exercise Capacity: [place] with no exercise.  BP Response: Normal blood pressure response.  Clinical Symptoms: There is dyspnea.  ECG Impression: No significant ST segment change suggestive of ischemia.  Comparison with Prior Nuclear Study: Compared to 03/16/05, there is no ischemia on present study.  Overall Impression: Low risk stress nuclear study with a large, severe intensity, fixed inferior defect consistent with prior infarct; no ischemia.  LV Ejection Fraction: 48%. LV Wall Motion: Inferior akinesis.  Kirk Ruths   Myoview was reviewed with Dr. Burt Knack -  "Cecille Rubin - I looked at his images and old cath report from [facility] in 2006. Looks like he had subtotal occlusion of his RCA and this defect fits with that anatomy. With that said, if he is having chest pain I would probably go ahead and cath him." Ronalee Belts   Assessment / Plan:  1. CAD with stable angina - he wishes to continue with his current medical regimen. I have reminded him that the option of cardiac cath was still "on the table". For now, no change in therapy. See back in 6 months.   2. Extensive PVD - followed by Dr. Sammuel Hines at [facility] - will be going back in February.  3. HTN - BP looks great today.   4. HLD - on statin therapy   See back in 6 months with fasting labs. He is doing well.   Patient is agreeable to this plan and will call if any problems develop in the interim.   Burtis Junes, RN, [place] [address] [place], [place]  [address] [phone]

## 2014-06-22 ENCOUNTER — Other Ambulatory Visit: Payer: Self-pay | Admitting: Cardiovascular Disease

## 2014-07-01 ENCOUNTER — Other Ambulatory Visit: Payer: Self-pay | Admitting: Cardiovascular Disease

## 2014-07-05 ENCOUNTER — Other Ambulatory Visit: Payer: Self-pay | Admitting: *Deleted

## 2014-07-05 DIAGNOSIS — I10 Essential (primary) hypertension: Secondary | ICD-10-CM

## 2014-07-05 MED ORDER — NITROGLYCERIN 0.4 MG SL SUBL: 0.4000 mg | SUBLINGUAL_TABLET | SUBLINGUAL | Status: DC | PRN

## 2014-07-19 DIAGNOSIS — L02611 Cutaneous abscess of right foot: Secondary | ICD-10-CM | POA: Diagnosis not present

## 2014-07-19 DIAGNOSIS — L299 Pruritus, unspecified: Secondary | ICD-10-CM | POA: Diagnosis not present

## 2014-07-22 DIAGNOSIS — L309 Dermatitis, unspecified: Secondary | ICD-10-CM | POA: Diagnosis not present

## 2014-07-22 DIAGNOSIS — L0100 Impetigo, unspecified: Secondary | ICD-10-CM | POA: Diagnosis not present

## 2014-07-22 DIAGNOSIS — L011 Impetiginization of other dermatoses: Secondary | ICD-10-CM | POA: Diagnosis not present

## 2014-07-29 ENCOUNTER — Other Ambulatory Visit: Payer: Self-pay | Admitting: Cardiovascular Disease

## 2014-07-30 DIAGNOSIS — L309 Dermatitis, unspecified: Secondary | ICD-10-CM | POA: Diagnosis not present

## 2014-08-12 ENCOUNTER — Other Ambulatory Visit: Payer: Self-pay | Admitting: Cardiovascular Disease

## 2014-08-15 DIAGNOSIS — I8311 Varicose veins of right lower extremity with inflammation: Secondary | ICD-10-CM | POA: Diagnosis not present

## 2014-08-15 DIAGNOSIS — I872 Venous insufficiency (chronic) (peripheral): Secondary | ICD-10-CM | POA: Diagnosis not present

## 2014-08-15 DIAGNOSIS — I8312 Varicose veins of left lower extremity with inflammation: Secondary | ICD-10-CM | POA: Diagnosis not present

## 2014-09-19 DIAGNOSIS — L309 Dermatitis, unspecified: Secondary | ICD-10-CM | POA: Diagnosis not present

## 2014-10-08 DIAGNOSIS — R600 Localized edema: Secondary | ICD-10-CM | POA: Diagnosis not present

## 2014-10-08 DIAGNOSIS — L309 Dermatitis, unspecified: Secondary | ICD-10-CM | POA: Diagnosis not present

## 2014-10-18 ENCOUNTER — Other Ambulatory Visit: Payer: Self-pay | Admitting: Cardiovascular Disease

## 2014-10-25 DIAGNOSIS — L309 Dermatitis, unspecified: Secondary | ICD-10-CM | POA: Diagnosis not present

## 2014-11-06 ENCOUNTER — Telehealth: Payer: Self-pay | Admitting: *Deleted

## 2014-11-06 ENCOUNTER — Ambulatory Visit: Payer: Medicare Other | Admitting: Nurse Practitioner

## 2014-11-06 DIAGNOSIS — L309 Dermatitis, unspecified: Secondary | ICD-10-CM | POA: Diagnosis not present

## 2014-11-06 DIAGNOSIS — R600 Localized edema: Secondary | ICD-10-CM | POA: Diagnosis not present

## 2014-11-06 NOTE — Telephone Encounter (Signed)
lvm to see why pt did not come to scheduled appointment today.

## 2014-11-07 NOTE — Telephone Encounter (Signed)
Let's talk about sending him to the wound clinic and getting lower extremity arterial dopplers.

## 2014-11-07 NOTE — Telephone Encounter (Signed)
S/w pt's wife stated pt developed staph infection in left foot and ankle in February in the [place],  now the infection moved to the right ankle and foot.  Pt cannot get shoes on feet due to swelling and pain.   Pt has been on antibiotic's 5 different times and just got off of prednisone.  Dr. Alroy Dust stated cannot do anything else for pt. Due to messing up pt's potassium and lasix's.  Denies SOB and Chest pain.  Made appointment for pt to see Truitt Merle, NP, on Tuesday, June 21.  Pt's wife agreeable to plan.  Stated I will route this message to Cecille Rubin and if Cecille Rubin wants to see pt sooner or send to hospital will contact pt's wife otherwise keep appointment. Pt's wife has bronchitis and apologized for missing appointment stated thought appointment was next week.

## 2014-11-07 NOTE — Telephone Encounter (Signed)
Pt c/o swelling: STAT is pt has developed SOB within 24 hours  1. How long have you been experiencing swelling? Since February  2. Where is the swelling located? Both feet  3.  Are you currently taking a "fluid pill"? No  4.  Are you currently SOB?  No  5.  Have you traveled recently? No  Pt's wife calling stating that they forgot about pt's appt yesterday and wanted to reschedule w/ Cecille Rubin only, offered her first available appt on 12/11/14 and pt's wife states that he needs to be seen before then because of these swelling issues. Please call back and advise.

## 2014-11-08 ENCOUNTER — Other Ambulatory Visit: Payer: Self-pay | Admitting: Nurse Practitioner

## 2014-11-08 ENCOUNTER — Other Ambulatory Visit (INDEPENDENT_AMBULATORY_CARE_PROVIDER_SITE_OTHER): Payer: Medicare Other | Admitting: *Deleted

## 2014-11-08 ENCOUNTER — Ambulatory Visit (HOSPITAL_COMMUNITY): Payer: Medicare Other | Attending: Cardiovascular Disease

## 2014-11-08 ENCOUNTER — Other Ambulatory Visit (HOSPITAL_COMMUNITY): Payer: Self-pay | Admitting: *Deleted

## 2014-11-08 ENCOUNTER — Telehealth: Payer: Self-pay | Admitting: Nurse Practitioner

## 2014-11-08 DIAGNOSIS — X58XXXA Exposure to other specified factors, initial encounter: Secondary | ICD-10-CM | POA: Diagnosis not present

## 2014-11-08 DIAGNOSIS — R0600 Dyspnea, unspecified: Secondary | ICD-10-CM | POA: Diagnosis not present

## 2014-11-08 DIAGNOSIS — R609 Edema, unspecified: Secondary | ICD-10-CM

## 2014-11-08 DIAGNOSIS — S81801A Unspecified open wound, right lower leg, initial encounter: Secondary | ICD-10-CM | POA: Diagnosis not present

## 2014-11-08 DIAGNOSIS — L98491 Non-pressure chronic ulcer of skin of other sites limited to breakdown of skin: Secondary | ICD-10-CM

## 2014-11-08 DIAGNOSIS — L97501 Non-pressure chronic ulcer of other part of unspecified foot limited to breakdown of skin: Secondary | ICD-10-CM

## 2014-11-08 LAB — BASIC METABOLIC PANEL
BUN: 15 mg/dL (ref 6–23)
CO2: 23 mEq/L (ref 19–32)
Calcium: 9 mg/dL (ref 8.4–10.5)
Chloride: 93 mEq/L — ABNORMAL LOW (ref 96–112)
Creatinine, Ser: 0.81 mg/dL (ref 0.40–1.50)
GFR: 101.23 mL/min (ref >60.00)
Glucose, Bld: 91 mg/dL (ref 70–99)
Potassium: 4 mEq/L (ref 3.5–5.1)
Sodium: 124 mEq/L — ABNORMAL LOW (ref 135–145)

## 2014-11-08 LAB — BRAIN NATRIURETIC PEPTIDE: Pro B Natriuretic peptide (BNP): 179 pg/mL — ABNORMAL HIGH (ref 0.0–100.0)

## 2014-11-08 MED ORDER — FUROSEMIDE 20 MG PO TABS: 20.0000 mg | ORAL_TABLET | Freq: Every day | ORAL | Status: DC | PRN

## 2014-11-08 NOTE — Telephone Encounter (Signed)
Spoke with patient's wife and verified that Truitt Merle, NP sent Rx to patient's pharmacy.  She thanked me for the call.

## 2014-11-08 NOTE — Progress Notes (Signed)
Saw Mr. Veness in the vascular lab - he has considerable swelling of his feet - especially on the right. He can barely get his bedroom shoes.  He has been treated for bilateral ulcers + for staph. Currently does NOT have any open wounds/sores/ulcers.  Will check BMET and BNP Stop Norvasc Start lasix 20 mg a day prn.  I will see back as planned on Tuesday. We do not need to do the ABIs that are scheduled for Tuesday.  Burtis Junes, RN, [place] [address] [place], [place]  [address] [phone]

## 2014-11-08 NOTE — Telephone Encounter (Signed)
New Prob    Wife calling to verify prescription was sent over to pharmacy (she is unsure of the prescription) today. Please call.

## 2014-11-12 ENCOUNTER — Encounter: Payer: Self-pay | Admitting: Nurse Practitioner

## 2014-11-12 ENCOUNTER — Ambulatory Visit (INDEPENDENT_AMBULATORY_CARE_PROVIDER_SITE_OTHER): Payer: Medicare Other | Admitting: Nurse Practitioner

## 2014-11-12 ENCOUNTER — Encounter (HOSPITAL_COMMUNITY): Payer: Medicare Other

## 2014-11-12 VITALS — BP 120/72 | HR 63 | Ht 71.0 in | Wt 201.8 lb

## 2014-11-12 DIAGNOSIS — I25810 Atherosclerosis of coronary artery bypass graft(s) without angina pectoris: Secondary | ICD-10-CM | POA: Diagnosis not present

## 2014-11-12 DIAGNOSIS — I10 Essential (primary) hypertension: Secondary | ICD-10-CM | POA: Diagnosis not present

## 2014-11-12 DIAGNOSIS — E785 Hyperlipidemia, unspecified: Secondary | ICD-10-CM | POA: Diagnosis not present

## 2014-11-12 DIAGNOSIS — I739 Peripheral vascular disease, unspecified: Secondary | ICD-10-CM

## 2014-11-12 DIAGNOSIS — E871 Hypo-osmolality and hyponatremia: Secondary | ICD-10-CM

## 2014-11-12 DIAGNOSIS — R609 Edema, unspecified: Secondary | ICD-10-CM

## 2014-11-12 LAB — BASIC METABOLIC PANEL
BUN: 16 mg/dL (ref 6–23)
CO2: 23 mEq/L (ref 19–32)
Calcium: 9.1 mg/dL (ref 8.4–10.5)
Chloride: 94 mEq/L — ABNORMAL LOW (ref 96–112)
Creatinine, Ser: 1.12 mg/dL (ref 0.40–1.50)
GFR: 69.64 mL/min (ref >60.00)
Glucose, Bld: 96 mg/dL (ref 70–99)
Potassium: 3.8 mEq/L (ref 3.5–5.1)
Sodium: 127 mEq/L — ABNORMAL LOW (ref 135–145)

## 2014-11-12 MED ORDER — ISOSORBIDE MONONITRATE ER 120 MG PO TB24: 120.0000 mg | ORAL_TABLET | Freq: Every day | ORAL | Status: DC

## 2014-11-12 NOTE — Patient Instructions (Addendum)
We will be checking the following labs today - BMET   Medication Instructions:    Continue with your current medicines but   I am increasing the Imdur to 120 mg each day - this has been sent to your drug store    Testing/Procedures To Be Arranged:  N/A  Follow-Up:  I will see you back in 3 weeks - try to put on a day that Dr. Burt Knack is here   Other Special Instructions:   Cut back total fluid intake to less than 1500 cc per day - (1 1/2 liters)  Call the [place] office at [phone] if you have any questions, problems or concerns.

## 2014-11-12 NOTE — Progress Notes (Signed)
CARDIOLOGY OFFICE NOTE  Date:  11/12/2014    [name] Date of Birth: [birth year] Medical Record [id number]  PCP:  Donnie Coffin, MD  Cardiologist:  Burt Knack    Chief Complaint  Patient presents with  . Coronary Artery Disease    Follow up visit. Seen for Dr. Burt Knack    History of Present Illness: RYOMA PACKETT is a 64 y.o. male who presents today for a follow up visit. Seen for Dr. Burt Knack. Former patient of Dr. Susa Simmonds. He has an extensive history of PVD and CAD. He was followed by Dr. Bridgett Larsson at [facility] but now seeing Vascular in [facility]. He has had remote cath in 2006 showing normal LV function with a totally occluded RCA with left to right collaterals. He has distal small vessel disease in the RCA with diffuse mild to moderate stenosis in the LAD. Has never had a MI. No longer smoking. His PVD history is quite extensive and includes AAA (with no plans to intervene due to the nature of his aneurysm and his multiple comorbidities).   Other problems include HTN, HLD and OA. He has a history of hyponatremia as well.   Seen several times in 2014/2015 by me and by Dr. Burt Knack. Had had CCS class 2-3 symptoms. Some improvement with long acting nitrate therapy. Had his Norvasc increased as well. Mr. Kivi has wished to avoid cardiac catheterization and has opted for medical management. However, he has had easily palpable radial pulses and Dr. Burt Knack feels a radial cath could be undertaken despite his extensive PVD.   Last seen in December - fairly stable symptoms.  Missed his follow up earlier this month with me. We called due to the unusual nature of this - found out he had staph infection - ended up having in both feet on separate occasions - lots of swelling - multiple rounds of antibiotics with no improvement - I was going to check a doppler and refer him to the wound clinic. I actually saw him when he was here for his doppler - no DVT noted - no actual open wounds - I stopped his  Norvasc and gave him a short course of Lasix.   Comes back today. Here with his wife. Swelling may be a bit better but still not resolved. BP is ok. Having more chest pain - mostly in the mornings. Using NTG 3 to 4 times per week. Has not been really restricting his fluids - says he forgot. Drinks an undetermined amount of beer as well. Says he can cut back. Weight is down.   Past Medical History  Diagnosis Date  . Hyperlipidemia   . Coronary artery disease   . AAA (abdominal aortic aneurysm)   . PAD (peripheral artery disease)   . COPD (chronic obstructive pulmonary disease)   . Raynaud's disease /phenomenon   . Lumbar degenerative disc disease   . Stroke 10/15/2005  . PONV (postoperative nausea and vomiting)   . Hypertension     takes meds daily  . Anxiety   . GERD (gastroesophageal reflux disease)   . History of blood transfusion     Past Surgical History  Procedure Laterality Date  . Iliac artery stent  10/15/2005     Left common and external   . Cardiac catheterization  2006  . Carotid endarterectomy      bilateral  . Carotid endarterectomy  04/13/2005    Right and 06/03/2005 Left  . Abdominal angiogram  03/30/2012  . Hernia  repair    . Abdominal aortagram N/A 03/30/2012    Procedure: ABDOMINAL Maxcine Ham;  Surgeon: Conrad [place], MD;  Location: [facility] CATH LAB;  Service: Cardiovascular;  Laterality: N/A;     Medications: Current Outpatient Prescriptions  Medication Sig Dispense Refill  . aspirin 81 MG tablet Take 81 mg by mouth daily.      . carvedilol (COREG) 25 MG tablet take 1 and 1/2 tablets by mouth twice a day 90 tablet 6  . clopidogrel (PLAVIX) 75 MG tablet take 1 tablet by mouth once daily 90 tablet 0  . CRESTOR 20 MG tablet take 1 tablet by mouth once daily at bedtime 30 tablet 5  . cyclobenzaprine (FLEXERIL) 5 MG tablet Take 5 mg by mouth 3 (three) times daily as needed.   0  . furosemide (LASIX) 20 MG tablet Take 1 tablet (20 mg total) by mouth daily as needed. 30  tablet 6  . lisinopril (PRINIVIL,ZESTRIL) 20 MG tablet take 1 tablet by mouth twice a day 60 tablet 3  . nitroGLYCERIN (NITROSTAT) 0.4 MG SL tablet Place 1 tablet (0.4 mg total) under the tongue every 5 (five) minutes as needed. 25 tablet 6  . ranitidine (ZANTAC) 300 MG tablet take 1 tablet by mouth at bedtime 30 tablet 6  . Thiamine HCl (VITAMIN B-1) 100 MG tablet Take 100 mg by mouth daily.      . traMADol (ULTRAM) 50 MG tablet Take 50 mg by mouth every 6 (six) hours as needed.   0  . isosorbide mononitrate (IMDUR) 120 MG 24 hr tablet Take 1 tablet (120 mg total) by mouth daily. 90 tablet 6   No current facility-administered medications for this visit.    Allergies: Allergies  Allergen Reactions  . Pletal [Cilostazol] Diarrhea    Social History: The patient  reports that he quit smoking about 9 years ago. His smoking use included Cigarettes. He has never used smokeless tobacco. He reports that he drinks about 14.4 oz of alcohol per week. He reports that he does not use illicit drugs.   Family History: The patient's family history includes Coronary artery disease (age of onset: 51) in his father; Dementia in his mother; Heart disease in his brother and father; Hyperlipidemia in his brother and father; Hypertension in his brother and father; Other (age of onset: 34) in his mother; Peripheral vascular disease in his father.   Review of Systems: Please see the history of present illness.   Otherwise, the review of systems is positive for none.   All other systems are reviewed and negative.   Physical Exam: VS:  BP 120/72 mmHg  Pulse 63  Ht 5\' 11"  (1.803 m)  Wt 201 lb 12.8 oz (91.536 kg)  BMI 28.16 kg/m2  SpO2 96% .  BMI Body mass index is 28.16 kg/(m^2).  Wt Readings from Last 3 Encounters:  11/12/14 201 lb 12.8 oz (91.536 kg)  05/08/14 209 lb 12.8 oz (95.165 kg)  01/08/14 211 lb (95.709 kg)    General: Pleasant. Well developed, well nourished and in no acute distress. His  weight is down 8 pounds over the past 6 months. HEENT: Normal. Neck: Supple, no JVD, carotid bruits, or masses noted.  Cardiac: Regular rate and rhythm. No murmurs, rubs, or gallops. No edema.  Respiratory:  Lungs are clear to auscultation bilaterally with normal work of breathing.  GI: Soft and nontender.  MS: No deformity or atrophy. Gait and ROM intact. Skin: Warm and dry. Color is normal.  Neuro:  Strength and sensation are intact and no gross focal deficits noted.  Psych: Alert, appropriate and with normal affect.        LABORATORY DATA:  EKG:  EKG is not ordered today.   Lab Results  Component Value Date   WBC 6.3 05/14/2013   HGB 12.1* 05/14/2013   HCT 34.8* 05/14/2013   PLT 216.0 05/14/2013   GLUCOSE 91 11/08/2014   CHOL 133 01/08/2014   TRIG 60.0 01/08/2014   HDL 78.00 01/08/2014   LDLCALC 43 01/08/2014   ALT 16 01/08/2014   AST 27 01/08/2014   NA 124* 11/08/2014   K 4.0 11/08/2014   CL 93* 11/08/2014   CREATININE 0.81 11/08/2014   BUN 15 11/08/2014   CO2 23 11/08/2014   TSH 0.74 01/12/2013   INR 0.98 05/29/2012    BNP (last 3 results) No results for input(s): BNP in the last 8760 hours.  ProBNP (last 3 results)  Recent Labs  11/08/14 1544  PROBNP 179.0*     Other Studies Reviewed Today: Myoview Impression from December 2014  Exercise Capacity: [place] with no exercise.  BP Response: Normal blood pressure response.  Clinical Symptoms: There is dyspnea.  ECG Impression: No significant ST segment change suggestive of ischemia.  Comparison with Prior Nuclear Study: Compared to 03/16/05, there is no ischemia on present study.  Overall Impression: Low risk stress nuclear study with a large, severe intensity, fixed inferior defect consistent with prior infarct; no ischemia.  LV Ejection Fraction: 48%. LV Wall Motion: Inferior akinesis.  Kirk Ruths   Myoview was reviewed with Dr. Burt Knack -  "Cecille Rubin - I looked at his images and old cath  report from [facility] in 2006. Looks like he had subtotal occlusion of his RCA and this defect fits with that anatomy. With that said, if he is having chest pain I would probably go ahead and cath him." Ronalee Belts   Assessment / Plan:  1. CAD with stable angina - he wishes to continue with his current medical regimen. I have reminded him that the option of cardiac cath was still "on the table". Will try increasing his Imdur but if symptoms persist, may need to proceed with radial cath. Will try to get him back here on a day that Dr. Burt Knack is here. See in 3 weeks. Imdur is increased to 120 mg a day. He has NTG on hand.   2. Extensive PVD - followed by Dr. Sammuel Hines at [facility] - will be going back later this summer for evaluation.   3. HTN - Now off Norvasc due to pedal edema - BP so far is ok.   4. HLD - on statin therapy   5. Lower extremity edema - off of Norvasc - mild improvement.   6. Hyponatremia - sodium down to 124 last week - fluid restriction reiterated (this will include his intake of beer as well)  - recheck today - may need to see renal if does not improve. Lasix is to just be prn now.   Current medicines are reviewed with the patient today.  The patient does not have concerns regarding medicines other than what has been noted above.  The following changes have been made:  See above.  Labs/ tests ordered today include:    Orders Placed This Encounter  Procedures  . Basic metabolic panel     Disposition:   FU with me in 3 weeks.   Patient is agreeable to this plan and will call if any problems develop  in the interim.   Signed: Burtis Junes, RN, ANP-C 11/12/2014 10:27 AM  [facility] [address] [place], [place]  [address] Phone: [phone] Fax: [phone]

## 2014-11-15 ENCOUNTER — Other Ambulatory Visit: Payer: Self-pay | Admitting: Cardiovascular Disease

## 2014-11-20 ENCOUNTER — Encounter: Payer: Self-pay | Admitting: Nurse Practitioner

## 2014-11-27 ENCOUNTER — Encounter: Payer: Self-pay | Admitting: Nurse Practitioner

## 2014-11-27 ENCOUNTER — Ambulatory Visit (INDEPENDENT_AMBULATORY_CARE_PROVIDER_SITE_OTHER): Payer: Medicare Other | Admitting: Nurse Practitioner

## 2014-11-27 VITALS — BP 150/80 | HR 68 | Ht 71.0 in | Wt 200.8 lb

## 2014-11-27 DIAGNOSIS — I25810 Atherosclerosis of coronary artery bypass graft(s) without angina pectoris: Secondary | ICD-10-CM | POA: Diagnosis not present

## 2014-11-27 DIAGNOSIS — I10 Essential (primary) hypertension: Secondary | ICD-10-CM | POA: Diagnosis not present

## 2014-11-27 DIAGNOSIS — I739 Peripheral vascular disease, unspecified: Secondary | ICD-10-CM

## 2014-11-27 LAB — BASIC METABOLIC PANEL
BUN: 21 mg/dL (ref 6–23)
CO2: 24 mEq/L (ref 19–32)
Calcium: 9.1 mg/dL (ref 8.4–10.5)
Chloride: 96 mEq/L (ref 96–112)
Creatinine, Ser: 1.26 mg/dL (ref 0.40–1.50)
GFR: 60.78 mL/min (ref >60.00)
Glucose, Bld: 92 mg/dL (ref 70–99)
Potassium: 4.2 mEq/L (ref 3.5–5.1)
Sodium: 129 mEq/L — ABNORMAL LOW (ref 135–145)

## 2014-11-27 NOTE — Progress Notes (Signed)
CARDIOLOGY OFFICE NOTE  Date:  11/27/2014    [name] Date of Birth: 10/26/1950 Medical Record [id number]  PCP:  Donnie Coffin, MD  Cardiologist:  Burt Knack    Chief Complaint  Patient presents with  . Coronary Artery Disease    Follow up visit - seen for Dr. Burt Knack    History of Present Illness: [name] is a 64 y.o. male who presents today for a follow up visit. Seen for Dr. Burt Knack. Former patient of Dr. Susa Simmonds. He has an extensive history of PVD and CAD. He was followed by Dr. Bridgett Larsson at [facility] but now seeing Vascular in [facility]. He has had remote cath in 2006 showing normal LV function with a totally occluded RCA with left to right collaterals. He has distal small vessel disease in the RCA with diffuse mild to moderate stenosis in the LAD. Has never had a MI. No longer smoking. His PVD history is quite extensive and includes AAA (with no plans to intervene due to the nature of his aneurysm and his multiple comorbidities).   Other problems include HTN, HLD and OA. He has a history of hyponatremia as well.   Seen several times in 2014/2015 by me and by Dr. Burt Knack. Had had CCS class 2-3 symptoms. Some improvement with long acting nitrate therapy. Had his Norvasc increased as well. [name] has wished to avoid cardiac catheterization and has opted for medical management. However, he has had easily palpable radial pulses and Dr. Burt Knack feels a radial cath could be undertaken despite his extensive PVD.   Last seen in December - fairly stable symptoms.  Missed his follow up back in June. We called due to the unusual nature of this - found out he had staph infection - ended up having in both feet on separate occasions - lots of swelling - multiple rounds of antibiotics with no improvement - I was going to check a doppler and refer him to the wound clinic. I actually saw him when he was here for his doppler - no DVT noted - no actual open wounds - I stopped his Norvasc and  gave him a short course of Lasix.   Seen a few weeks ago - having chest pain. Using more NTG. Not restricting his fluids. Drinking too much alcohol. I increased his nitrate. He was contemplating cardiac cath.   Comes back today. Here with his wife. Doing better. No more swelling. This is totally gone. Feet are no longer red. His chest pain has improved has well. Not using as much NTG. He is happy with how he is doing.   Past Medical History  Diagnosis Date  . Hyperlipidemia   . Coronary artery disease   . AAA (abdominal aortic aneurysm)   . PAD (peripheral artery disease)   . COPD (chronic obstructive pulmonary disease)   . Raynaud's disease /phenomenon   . Lumbar degenerative disc disease   . Stroke 10/15/2005  . PONV (postoperative nausea and vomiting)   . Hypertension     takes meds daily  . Anxiety   . GERD (gastroesophageal reflux disease)   . History of blood transfusion     Past Surgical History  Procedure Laterality Date  . Iliac artery stent  10/15/2005     Left common and external   . Cardiac catheterization  2006  . Carotid endarterectomy      bilateral  . Carotid endarterectomy  04/13/2005    Right and 06/03/2005 Left  .  Abdominal angiogram  03/30/2012  . Hernia repair    . Abdominal aortagram N/A 03/30/2012    Procedure: ABDOMINAL Maxcine Ham;  Surgeon: Conrad Ridgely, MD;  Location: [facility] CATH LAB;  Service: Cardiovascular;  Laterality: N/A;     Medications: Current Outpatient Prescriptions  Medication Sig Dispense Refill  . aspirin 81 MG tablet Take 81 mg by mouth daily.      . carvedilol (COREG) 25 MG tablet take 1 and 1/2 tablets by mouth twice a day 90 tablet 6  . clopidogrel (PLAVIX) 75 MG tablet take 1 tablet by mouth once daily 90 tablet 0  . CRESTOR 20 MG tablet take 1 tablet by mouth once daily at bedtime 30 tablet 5  . cyclobenzaprine (FLEXERIL) 5 MG tablet Take 5 mg by mouth 3 (three) times daily as needed.   0  . furosemide (LASIX) 20 MG tablet Take 1  tablet (20 mg total) by mouth daily as needed. 30 tablet 6  . isosorbide mononitrate (IMDUR) 120 MG 24 hr tablet Take 1 tablet (120 mg total) by mouth daily. 90 tablet 6  . lisinopril (PRINIVIL,ZESTRIL) 20 MG tablet take 1 tablet by mouth twice a day 60 tablet 11  . nitroGLYCERIN (NITROSTAT) 0.4 MG SL tablet Place 1 tablet (0.4 mg total) under the tongue every 5 (five) minutes as needed. 25 tablet 6  . ranitidine (ZANTAC) 300 MG tablet take 1 tablet by mouth at bedtime 30 tablet 6  . Thiamine HCl (VITAMIN B-1) 100 MG tablet Take 100 mg by mouth daily.       No current facility-administered medications for this visit.    Allergies: Allergies  Allergen Reactions  . Pletal [Cilostazol] Diarrhea  . Norvasc [Amlodipine Besylate]     Swelling     Social History: The patient  reports that he quit smoking about 9 years ago. His smoking use included Cigarettes. He has never used smokeless tobacco. He reports that he drinks about 14.4 oz of alcohol per week. He reports that he does not use illicit drugs.   Family History: The patient's family history includes Coronary artery disease (age of onset: 25) in his father; Dementia in his mother; Heart disease in his brother and father; Hyperlipidemia in his brother and father; Hypertension in his brother and father; Other (age of onset: 28) in his mother; Peripheral vascular disease in his father.   Review of Systems: Please see the history of present illness.   Otherwise, the review of systems is positive for none.   All other systems are reviewed and negative.   Physical Exam: VS:  BP 150/80 mmHg  Pulse 68  Ht 5\' 11"  (1.803 m)  Wt 200 lb 12.8 oz (91.082 kg)  BMI 28.02 kg/m2  SpO2 97% .  BMI Body mass index is 28.02 kg/(m^2).  Wt Readings from Last 3 Encounters:  11/27/14 200 lb 12.8 oz (91.082 kg)  11/12/14 201 lb 12.8 oz (91.536 kg)  05/08/14 209 lb 12.8 oz (95.165 kg)    General: Pleasant. Well developed, well nourished and in no acute  distress.  HEENT: Normal. Neck: Supple, no JVD, carotid bruits, or masses noted.  Cardiac: Regular rate and rhythm. No murmurs, rubs, or gallops. No edema.  Respiratory:  Lungs are clear to auscultation bilaterally with normal work of breathing.  GI: Soft and nontender.  MS: No deformity or atrophy. Gait and ROM intact. Skin: Warm and dry. Color is normal.  Neuro:  Strength and sensation are intact and no gross focal deficits  noted.  Psych: Alert, appropriate and with normal affect.   LABORATORY DATA:  EKG:  EKG is not ordered today.   Lab Results  Component Value Date   WBC 6.3 05/14/2013   HGB 12.1* 05/14/2013   HCT 34.8* 05/14/2013   PLT 216.0 05/14/2013   GLUCOSE 96 11/12/2014   CHOL 133 01/08/2014   TRIG 60.0 01/08/2014   HDL 78.00 01/08/2014   LDLCALC 43 01/08/2014   ALT 16 01/08/2014   AST 27 01/08/2014   NA 127* 11/12/2014   K 3.8 11/12/2014   CL 94* 11/12/2014   CREATININE 1.12 11/12/2014   BUN 16 11/12/2014   CO2 23 11/12/2014   TSH 0.74 01/12/2013   INR 0.98 05/29/2012    BNP (last 3 results) No results for input(s): BNP in the last 8760 hours.  ProBNP (last 3 results)  Recent Labs  11/08/14 1544  PROBNP 179.0*     Other Studies Reviewed Today: Myoview Impression from December 2014  Exercise Capacity: [place] with no exercise.  BP Response: Normal blood pressure response.  Clinical Symptoms: There is dyspnea.  ECG Impression: No significant ST segment change suggestive of ischemia.  Comparison with Prior Nuclear Study: Compared to 03/16/05, there is no ischemia on present study.  Overall Impression: Low risk stress nuclear study with a large, severe intensity, fixed inferior defect consistent with prior infarct; no ischemia.  LV Ejection Fraction: 48%. LV Wall Motion: Inferior akinesis.  Kirk Ruths   Myoview was reviewed with Dr. Burt Knack -  "Cecille Rubin - I looked at his images and old cath report from [facility] in 2006. Looks like he  had subtotal occlusion of his RCA and this defect fits with that anatomy. With that said, if he is having chest pain I would probably go ahead and cath him." Ronalee Belts   Assessment / Plan:  1. CAD with stable angina - he wishes to continue with his current medical regimen. He is improved with the increase in nitrate.  I have reminded him that the option of cardiac cath was still "on the table".  He has NTG on hand. We will continue with his current regimen. See again in 4 months.   2. Extensive PVD - followed by Dr. Sammuel Hines at [facility] - will be going back later this summer for evaluation.   3. HTN - BP is fair. He will monitor at home. Call if consistently above 150. May need to add Hydralazine.   4. HLD - on statin therapy   5. Lower extremity edema - off of Norvasc - this is totally resolved now. He will not be able to have this in the future. I have listed this in his allergies.   6. Hyponatremia - sodium was down to 124 a few weeks ago - have reinstituted fluid restriction. Rechecking today.   Current medicines are reviewed with the patient today.  The patient does not have concerns regarding medicines other than what has been noted above.  The following changes have been made:  See above.  Labs/ tests ordered today include:    Orders Placed This Encounter  Procedures  . Basic metabolic panel     Disposition:   FU with me in 62months  Patient is agreeable to this plan and will call if any problems develop in the interim.   Signed: Burtis Junes, RN, ANP-C 11/27/2014 12:09 PM  [facility] [address] [place], [place]  [address] Phone: [phone] Fax: [phone]

## 2014-11-27 NOTE — Patient Instructions (Addendum)
We will be checking the following labs today -  BMET   Medication Instructions:    Continue with your current medicines.     Testing/Procedures To Be Arranged:  N/A  Follow-Up:   See me in 4 months    Other Special Instructions:   Monitor your blood pressure at home. Call me if consistently staying above 150  Call the [place] office at [phone] if you have any questions, problems or concerns.

## 2014-12-03 ENCOUNTER — Other Ambulatory Visit: Payer: Self-pay | Admitting: *Deleted

## 2014-12-03 ENCOUNTER — Telehealth: Payer: Self-pay | Admitting: Nurse Practitioner

## 2014-12-03 DIAGNOSIS — I10 Essential (primary) hypertension: Secondary | ICD-10-CM

## 2014-12-03 MED ORDER — HYDRALAZINE HCL 25 MG PO TABS: 25.0000 mg | ORAL_TABLET | Freq: Two times a day (BID) | ORAL | Status: DC

## 2014-12-03 NOTE — Telephone Encounter (Signed)
Let's start low dose Hydralazine on him - 25 mg just twice a day Continue to monitor BP

## 2014-12-03 NOTE — Telephone Encounter (Signed)
New message      Pt c/o BP issue: STAT if pt c/o blurred vision, one-sided weakness or slurred speech  1. What are your last 5 BP readings? 165/79  2. Are you having any other symptoms (ex. Dizziness, headache, blurred vision, passed out)? no 3. What is your BP issue? Pt was to let Cecille Rubin know if his bp got high

## 2014-12-03 NOTE — Telephone Encounter (Signed)
S/w with wife per DPR is agreeable to plan will start hydralazine ( 25 mg ) bid, sent into pt's requested pharmacy, [facility] on [address], will continue to monitor bp.

## 2014-12-07 ENCOUNTER — Emergency Department (HOSPITAL_COMMUNITY): Payer: Medicare Other

## 2014-12-07 ENCOUNTER — Encounter (HOSPITAL_COMMUNITY): Payer: Self-pay | Admitting: Emergency Medicine

## 2014-12-07 ENCOUNTER — Inpatient Hospital Stay (HOSPITAL_COMMUNITY)
Admission: EM | Admit: 2014-12-07 | Discharge: 2014-12-18 | DRG: 234 | Disposition: A | Discharge status: Home Health | Payer: Medicare Other | Attending: Surgery | Admitting: Surgery

## 2014-12-07 DIAGNOSIS — Z7902 Long term (current) use of antithrombotics/antiplatelets: Secondary | ICD-10-CM | POA: Diagnosis not present

## 2014-12-07 DIAGNOSIS — Z8673 Personal history of transient ischemic attack (TIA), and cerebral infarction without residual deficits: Secondary | ICD-10-CM

## 2014-12-07 DIAGNOSIS — Z951 Presence of aortocoronary bypass graft: Secondary | ICD-10-CM | POA: Diagnosis not present

## 2014-12-07 DIAGNOSIS — Z4682 Encounter for fitting and adjustment of non-vascular catheter: Secondary | ICD-10-CM | POA: Diagnosis not present

## 2014-12-07 DIAGNOSIS — D62 Acute posthemorrhagic anemia: Secondary | ICD-10-CM | POA: Diagnosis not present

## 2014-12-07 DIAGNOSIS — I714 Abdominal aortic aneurysm, without rupture, unspecified: Secondary | ICD-10-CM | POA: Diagnosis present

## 2014-12-07 DIAGNOSIS — I200 Unstable angina: Secondary | ICD-10-CM

## 2014-12-07 DIAGNOSIS — I25110 Atherosclerotic heart disease of native coronary artery with unstable angina pectoris: Secondary | ICD-10-CM | POA: Diagnosis not present

## 2014-12-07 DIAGNOSIS — Z87891 Personal history of nicotine dependence: Secondary | ICD-10-CM

## 2014-12-07 DIAGNOSIS — I10 Essential (primary) hypertension: Secondary | ICD-10-CM | POA: Diagnosis not present

## 2014-12-07 DIAGNOSIS — I237 Postinfarction angina: Secondary | ICD-10-CM | POA: Diagnosis not present

## 2014-12-07 DIAGNOSIS — I2510 Atherosclerotic heart disease of native coronary artery without angina pectoris: Secondary | ICD-10-CM | POA: Diagnosis present

## 2014-12-07 DIAGNOSIS — R079 Chest pain, unspecified: Secondary | ICD-10-CM | POA: Diagnosis not present

## 2014-12-07 DIAGNOSIS — I6523 Occlusion and stenosis of bilateral carotid arteries: Secondary | ICD-10-CM | POA: Diagnosis present

## 2014-12-07 DIAGNOSIS — I7300 Raynaud's syndrome without gangrene: Secondary | ICD-10-CM | POA: Diagnosis present

## 2014-12-07 DIAGNOSIS — Z79899 Other long term (current) drug therapy: Secondary | ICD-10-CM | POA: Diagnosis not present

## 2014-12-07 DIAGNOSIS — Z7982 Long term (current) use of aspirin: Secondary | ICD-10-CM

## 2014-12-07 DIAGNOSIS — E785 Hyperlipidemia, unspecified: Secondary | ICD-10-CM | POA: Diagnosis present

## 2014-12-07 DIAGNOSIS — J449 Chronic obstructive pulmonary disease, unspecified: Secondary | ICD-10-CM | POA: Diagnosis present

## 2014-12-07 DIAGNOSIS — J811 Chronic pulmonary edema: Secondary | ICD-10-CM | POA: Diagnosis not present

## 2014-12-07 DIAGNOSIS — K5900 Constipation, unspecified: Secondary | ICD-10-CM | POA: Diagnosis not present

## 2014-12-07 DIAGNOSIS — I209 Angina pectoris, unspecified: Secondary | ICD-10-CM

## 2014-12-07 DIAGNOSIS — J9811 Atelectasis: Secondary | ICD-10-CM | POA: Diagnosis not present

## 2014-12-07 DIAGNOSIS — K219 Gastro-esophageal reflux disease without esophagitis: Secondary | ICD-10-CM | POA: Diagnosis present

## 2014-12-07 DIAGNOSIS — Z955 Presence of coronary angioplasty implant and graft: Secondary | ICD-10-CM

## 2014-12-07 DIAGNOSIS — I214 Non-ST elevation (NSTEMI) myocardial infarction: Secondary | ICD-10-CM | POA: Diagnosis not present

## 2014-12-07 DIAGNOSIS — D6959 Other secondary thrombocytopenia: Secondary | ICD-10-CM | POA: Diagnosis not present

## 2014-12-07 DIAGNOSIS — Z8249 Family history of ischemic heart disease and other diseases of the circulatory system: Secondary | ICD-10-CM

## 2014-12-07 DIAGNOSIS — Z888 Allergy status to other drugs, medicaments and biological substances status: Secondary | ICD-10-CM | POA: Diagnosis not present

## 2014-12-07 DIAGNOSIS — I70219 Atherosclerosis of native arteries of extremities with intermittent claudication, unspecified extremity: Secondary | ICD-10-CM | POA: Diagnosis present

## 2014-12-07 DIAGNOSIS — E8770 Fluid overload, unspecified: Secondary | ICD-10-CM | POA: Diagnosis not present

## 2014-12-07 DIAGNOSIS — R0789 Other chest pain: Secondary | ICD-10-CM | POA: Diagnosis not present

## 2014-12-07 DIAGNOSIS — I739 Peripheral vascular disease, unspecified: Secondary | ICD-10-CM | POA: Diagnosis present

## 2014-12-07 DIAGNOSIS — E871 Hypo-osmolality and hyponatremia: Secondary | ICD-10-CM | POA: Diagnosis present

## 2014-12-07 DIAGNOSIS — Z452 Encounter for adjustment and management of vascular access device: Secondary | ICD-10-CM | POA: Diagnosis not present

## 2014-12-07 DIAGNOSIS — E876 Hypokalemia: Secondary | ICD-10-CM | POA: Diagnosis not present

## 2014-12-07 HISTORY — DX: Hypo-osmolality and hyponatremia: E87.1

## 2014-12-07 LAB — BASIC METABOLIC PANEL
ANION GAP: 11 (ref 5–15)
BUN: 14 mg/dL (ref 6–20)
CHLORIDE: 98 mmol/L — AB (ref 101–111)
CO2: 22 mmol/L (ref 22–32)
Calcium: 9.1 mg/dL (ref 8.9–10.3)
Creatinine, Ser: 1.15 mg/dL (ref 0.61–1.24)
Glucose, Bld: 104 mg/dL — ABNORMAL HIGH (ref 65–99)
POTASSIUM: 4.4 mmol/L (ref 3.5–5.1)
Sodium: 131 mmol/L — ABNORMAL LOW (ref 135–145)

## 2014-12-07 LAB — CBC WITH DIFFERENTIAL/PLATELET
BASOS ABS: 0 10*3/uL (ref 0.0–0.1)
Basophils Relative: 1 % (ref 0–1)
EOS PCT: 3 % (ref 0–5)
Eosinophils Absolute: 0.2 10*3/uL (ref 0.0–0.7)
HCT: 35 % — ABNORMAL LOW (ref 39.0–52.0)
Hemoglobin: 12.3 g/dL — ABNORMAL LOW (ref 13.0–17.0)
LYMPHS PCT: 11 % — AB (ref 12–46)
Lymphs Abs: 0.9 10*3/uL (ref 0.7–4.0)
MCH: 35 pg — ABNORMAL HIGH (ref 26.0–34.0)
MCHC: 35.1 g/dL (ref 30.0–36.0)
MCV: 99.7 fL (ref 78.0–100.0)
Monocytes Absolute: 1 10*3/uL (ref 0.1–1.0)
Monocytes Relative: 12 % (ref 3–12)
Neutro Abs: 6.4 10*3/uL (ref 1.7–7.7)
Neutrophils Relative %: 75 % (ref 43–77)
PLATELETS: 216 10*3/uL (ref 150–400)
RBC: 3.51 MIL/uL — ABNORMAL LOW (ref 4.22–5.81)
RDW: 12.6 % (ref 11.5–15.5)
WBC: 8.6 10*3/uL (ref 4.0–10.5)

## 2014-12-07 LAB — TROPONIN I
TROPONIN I: 0.04 ng/mL — AB (ref <0.031)
Troponin I: 2.65 ng/mL (ref <0.031)

## 2014-12-07 LAB — APTT: aPTT: 117 seconds — ABNORMAL HIGH (ref 24–37)

## 2014-12-07 LAB — LIPASE, BLOOD: LIPASE: 38 U/L (ref 22–51)

## 2014-12-07 LAB — PROTIME-INR
INR: 1.18 (ref 0.00–1.49)
Prothrombin Time: 15.2 seconds (ref 11.6–15.2)

## 2014-12-07 LAB — HEPARIN LEVEL (UNFRACTIONATED): Heparin Unfractionated: 0.36 IU/mL (ref 0.30–0.70)

## 2014-12-07 MED ORDER — NITROGLYCERIN IN D5W 200-5 MCG/ML-% IV SOLN
0.0000 ug/min | Freq: Once | INTRAVENOUS | Status: AC
  Administered 2014-12-07: 5 ug/min via INTRAVENOUS
  Filled 2014-12-07: qty 250

## 2014-12-07 MED ORDER — SODIUM CHLORIDE 0.9 % IJ SOLN: 3.0000 mL | INTRAMUSCULAR | Status: DC | PRN

## 2014-12-07 MED ORDER — LISINOPRIL 20 MG PO TABS
20.0000 mg | ORAL_TABLET | Freq: Two times a day (BID) | ORAL | Status: DC
  Administered 2014-12-07 – 2014-12-09 (×4): 20 mg via ORAL
  Filled 2014-12-07 (×4): qty 1

## 2014-12-07 MED ORDER — SODIUM CHLORIDE 0.9 % IV SOLN: 250.0000 mL | INTRAVENOUS | Status: DC | PRN

## 2014-12-07 MED ORDER — HEPARIN (PORCINE) IN NACL 100-0.45 UNIT/ML-% IJ SOLN
1450.0000 [IU]/h | INTRAMUSCULAR | Status: DC
  Administered 2014-12-07 – 2014-12-08 (×2): 1100 [IU]/h via INTRAVENOUS
  Administered 2014-12-09: 1450 [IU]/h via INTRAVENOUS
  Filled 2014-12-07 (×3): qty 250

## 2014-12-07 MED ORDER — TRIAMCINOLONE ACETONIDE 0.5 % EX CREA
1.0000 "application " | TOPICAL_CREAM | CUTANEOUS | Status: DC | PRN
  Filled 2014-12-07: qty 15

## 2014-12-07 MED ORDER — ASPIRIN 81 MG PO TABS: 81.0000 mg | ORAL_TABLET | Freq: Every day | ORAL | Status: DC

## 2014-12-07 MED ORDER — ASPIRIN 81 MG PO CHEW
81.0000 mg | CHEWABLE_TABLET | ORAL | Status: AC
  Administered 2014-12-09: 81 mg via ORAL
  Filled 2014-12-07: qty 1

## 2014-12-07 MED ORDER — SODIUM CHLORIDE 0.9 % WEIGHT BASED INFUSION
3.0000 mL/kg/h | INTRAVENOUS | Status: AC
  Administered 2014-12-09: 3 mL/kg/h via INTRAVENOUS

## 2014-12-07 MED ORDER — SODIUM CHLORIDE 0.9 % WEIGHT BASED INFUSION
1.0000 mL/kg/h | INTRAVENOUS | Status: DC
Start: 2014-12-08 — End: 2014-12-09

## 2014-12-07 MED ORDER — CLOPIDOGREL BISULFATE 75 MG PO TABS
75.0000 mg | ORAL_TABLET | Freq: Every day | ORAL | Status: DC
  Administered 2014-12-08 – 2014-12-09 (×2): 75 mg via ORAL
  Filled 2014-12-07 (×3): qty 1

## 2014-12-07 MED ORDER — NITROGLYCERIN 0.4 MG SL SUBL: 0.4000 mg | SUBLINGUAL_TABLET | SUBLINGUAL | Status: DC | PRN

## 2014-12-07 MED ORDER — SODIUM CHLORIDE 0.9 % IV SOLN
INTRAVENOUS | Status: DC
  Administered 2014-12-07: 12:00:00 via INTRAVENOUS

## 2014-12-07 MED ORDER — ACETAMINOPHEN 325 MG PO TABS: 650.0000 mg | ORAL_TABLET | ORAL | Status: DC | PRN

## 2014-12-07 MED ORDER — HYDRALAZINE HCL 25 MG PO TABS
25.0000 mg | ORAL_TABLET | Freq: Two times a day (BID) | ORAL | Status: DC
  Administered 2014-12-07 – 2014-12-09 (×4): 25 mg via ORAL
  Filled 2014-12-07 (×4): qty 1

## 2014-12-07 MED ORDER — NITROGLYCERIN 0.4 MG SL SUBL
0.4000 mg | SUBLINGUAL_TABLET | SUBLINGUAL | Status: DC | PRN
  Administered 2014-12-07: 0.4 mg via SUBLINGUAL

## 2014-12-07 MED ORDER — ONDANSETRON HCL 4 MG/2ML IJ SOLN: 4.0000 mg | Freq: Four times a day (QID) | INTRAMUSCULAR | Status: DC | PRN

## 2014-12-07 MED ORDER — ISOSORBIDE MONONITRATE ER 60 MG PO TB24
120.0000 mg | ORAL_TABLET | Freq: Every day | ORAL | Status: DC
  Administered 2014-12-09: 120 mg via ORAL
  Filled 2014-12-07: qty 2

## 2014-12-07 MED ORDER — ASPIRIN 300 MG RE SUPP: 300.0000 mg | RECTAL | Status: AC

## 2014-12-07 MED ORDER — CYCLOBENZAPRINE HCL 10 MG PO TABS: 5.0000 mg | ORAL_TABLET | Freq: Three times a day (TID) | ORAL | Status: DC | PRN

## 2014-12-07 MED ORDER — HEPARIN BOLUS VIA INFUSION
4000.0000 [IU] | Freq: Once | INTRAVENOUS | Status: AC
  Administered 2014-12-07: 4000 [IU] via INTRAVENOUS
  Filled 2014-12-07: qty 4000

## 2014-12-07 MED ORDER — ASPIRIN 81 MG PO CHEW
324.0000 mg | CHEWABLE_TABLET | ORAL | Status: AC
  Administered 2014-12-07: 324 mg via ORAL
  Filled 2014-12-07: qty 4

## 2014-12-07 MED ORDER — ASPIRIN EC 81 MG PO TBEC: 81.0000 mg | DELAYED_RELEASE_TABLET | Freq: Every day | ORAL | Status: DC

## 2014-12-07 MED ORDER — CARVEDILOL 25 MG PO TABS
37.5000 mg | ORAL_TABLET | Freq: Two times a day (BID) | ORAL | Status: DC
  Administered 2014-12-07 – 2014-12-09 (×4): 37.5 mg via ORAL
  Filled 2014-12-07 (×8): qty 1

## 2014-12-07 MED ORDER — ROSUVASTATIN CALCIUM 10 MG PO TABS
20.0000 mg | ORAL_TABLET | Freq: Every day | ORAL | Status: DC
  Administered 2014-12-07 – 2014-12-08 (×2): 20 mg via ORAL
  Filled 2014-12-07 (×2): qty 2

## 2014-12-07 MED ORDER — FAMOTIDINE 20 MG PO TABS
20.0000 mg | ORAL_TABLET | Freq: Every day | ORAL | Status: DC
  Administered 2014-12-07 – 2014-12-08 (×2): 20 mg via ORAL
  Filled 2014-12-07 (×2): qty 1

## 2014-12-07 MED ORDER — SODIUM CHLORIDE 0.9 % IJ SOLN: 3.0000 mL | Freq: Two times a day (BID) | INTRAMUSCULAR | Status: DC

## 2014-12-07 MED ORDER — ASPIRIN 81 MG PO CHEW
81.0000 mg | CHEWABLE_TABLET | Freq: Every day | ORAL | Status: DC
  Administered 2014-12-10 – 2014-12-12 (×3): 81 mg via ORAL
  Filled 2014-12-07 (×3): qty 1

## 2014-12-07 NOTE — ED Provider Notes (Signed)
CSN: [id number]     Arrival date & time 12/07/14  1009 History   First MD Initiated Contact with Patient 12/07/14 1016     No chief complaint on file.    (Consider location/radiation/quality/duration/timing/severity/associated sxs/prior Treatment) HPI Comments: Patient here complaining of one week of worsening chest pain does characterize as heaviness and worse the morning. Today was associated with diaphoresis. Pain did not radiate to his neck or arms. No associated palpitations or syncope or near-syncope. Did not have any nausea or vomiting. Recently saw his physician and had his dose of Imdur increased. He took 2 nitroglycerin tablets a day with slight resolution of his symptoms and currently has 3 of 10 chest pressure. Denies any prior history of coronary artery stent but does have known history of peripheral vascular disease.  The history is provided by the patient and the spouse.    Past Medical History  Diagnosis Date  . Hyperlipidemia   . Coronary artery disease   . AAA (abdominal aortic aneurysm)   . PAD (peripheral artery disease)   . COPD (chronic obstructive pulmonary disease)   . Raynaud's disease /phenomenon   . Lumbar degenerative disc disease   . Stroke 10/15/2005  . PONV (postoperative nausea and vomiting)   . Hypertension     takes meds daily  . Anxiety   . GERD (gastroesophageal reflux disease)   . History of blood transfusion    Past Surgical History  Procedure Laterality Date  . Iliac artery stent  10/15/2005     Left common and external   . Cardiac catheterization  2006  . Carotid endarterectomy      bilateral  . Carotid endarterectomy  04/13/2005    Right and 06/03/2005 Left  . Abdominal angiogram  03/30/2012  . Hernia repair    . Abdominal aortagram N/A 03/30/2012    Procedure: ABDOMINAL Maxcine Ham;  Surgeon: Conrad Verona, MD;  Location: [facility] CATH LAB;  Service: Cardiovascular;  Laterality: N/A;   Family History  Problem Relation Age of Onset  . Coronary  artery disease Father 46    HTN, Hyperlipidemia died  . Heart disease Father     Heart Disease before age 72  . Hypertension Father   . Hyperlipidemia Father   . Peripheral vascular disease Father   . Other Mother 1     died rheumatoid arthritis  . Dementia Mother   . Heart disease Brother   . Hypertension Brother   . Hyperlipidemia Brother    History  Substance Use Topics  . Smoking status: Former Smoker    Types: Cigarettes    Quit date: 05/24/2005  . Smokeless tobacco: Never Used  . Alcohol Use: 14.4 oz/week    24 Cans of beer per week     Comment: approximately 24 beer a week    Review of Systems  All other systems reviewed and are negative.     Allergies  Pletal and Norvasc  Home Medications   Prior to Admission medications   Medication Sig Start Date End Date Taking? Authorizing Provider  aspirin 81 MG tablet Take 81 mg by mouth daily.      Historical Provider, MD  carvedilol (COREG) 25 MG tablet take 1 and 1/2 tablets by mouth twice a day 07/30/14   [name], MD  clopidogrel (PLAVIX) 75 MG tablet take 1 tablet by mouth once daily 10/18/14   [name], MD  CRESTOR 20 MG tablet take 1 tablet by mouth once daily at bedtime 07/02/14  [name], MD  cyclobenzaprine (FLEXERIL) 5 MG tablet Take 5 mg by mouth 3 (three) times daily as needed.  05/01/14   Historical Provider, MD  furosemide (LASIX) 20 MG tablet Take 1 tablet (20 mg total) by mouth daily as needed. 11/08/14   Burtis Junes, NP  hydrALAZINE (APRESOLINE) 25 MG tablet Take 1 tablet (25 mg total) by mouth 2 (two) times daily. 12/03/14   Burtis Junes, NP  isosorbide mononitrate (IMDUR) 120 MG 24 hr tablet Take 1 tablet (120 mg total) by mouth daily. 11/12/14   Burtis Junes, NP  lisinopril (PRINIVIL,ZESTRIL) 20 MG tablet take 1 tablet by mouth twice a day 11/15/14   [name], MD  nitroGLYCERIN (NITROSTAT) 0.4 MG SL tablet Place 1 tablet (0.4 mg total) under the tongue every 5 (five) minutes  as needed. 07/05/14   Burtis Junes, NP  ranitidine (ZANTAC) 300 MG tablet take 1 tablet by mouth at bedtime 07/30/14   [name], MD  Thiamine HCl (VITAMIN B-1) 100 MG tablet Take 100 mg by mouth daily.      Historical Provider, MD   BP 184/85 mmHg  Pulse 67  Temp(Src) 97.5 F (36.4 C) (Oral)  Resp 18  SpO2 97% Physical Exam  Constitutional: He is oriented to person, place, and time. He appears well-developed and well-nourished.  Non-toxic appearance. No distress.  HENT:  Head: Normocephalic and atraumatic.  Eyes: Conjunctivae, EOM and lids are normal. Pupils are equal, round, and reactive to light.  Neck: Normal range of motion. Neck supple. No tracheal deviation present. No thyroid mass present.  Cardiovascular: Normal rate, regular rhythm and normal heart sounds.  Exam reveals no gallop.   No murmur heard. Pulmonary/Chest: Effort normal and breath sounds normal. No stridor. No respiratory distress. He has no decreased breath sounds. He has no wheezes. He has no rhonchi. He has no rales.  Abdominal: Soft. Normal appearance and bowel sounds are normal. He exhibits no distension. There is no tenderness. There is no rebound and no CVA tenderness.  Musculoskeletal: Normal range of motion. He exhibits no edema or tenderness.  Neurological: He is alert and oriented to person, place, and time. He has normal strength. No cranial nerve deficit or sensory deficit. GCS eye subscore is 4. GCS verbal subscore is 5. GCS motor subscore is 6.  Skin: Skin is warm and dry. No abrasion and no rash noted.  Psychiatric: He has a normal mood and affect. His speech is normal and behavior is normal.  Nursing note and vitals reviewed.   ED Course  Procedures (including critical care time) Labs Review Labs Reviewed  TROPONIN I  CBC WITH DIFFERENTIAL/PLATELET  BASIC METABOLIC PANEL  LIPASE, BLOOD    Imaging Review No results found.   EKG Interpretation   Date/Time:  Saturday December 07 2014  10:41:19 EDT Ventricular Rate:  66 PR Interval:  180 QRS Duration: 95 QT Interval:  402 QTC Calculation: 421 R Axis:   70 Text Interpretation:  Sinus rhythm Abnormal inferior Q waves Borderline ST  depression, lateral leads Confirmed by Nilsa Macht  MD, Jeven Topper ([id number]) on  12/07/2014 11:03:18 AM      MDM   Final diagnoses:  Chest pain    Patient started on nitroglycerin drip and case discussed with cardiology he will come to admit the patient. Pain is much improved after nitroglycerin    Lacretia Leigh, MD 12/07/14 1249

## 2014-12-07 NOTE — H&P (Addendum)
[name] is an 64 y.o. male.   Primary Cardiologist: PMD: Chief Complaint: Chest discomfort HPI: 65 year old man with extensive peripheral vascular disease and known coronary artery disease. He has an occluded right coronary artery. He has been medically managed. He has noticed some tightness in his chest over the past few days. This morning, it became more severe and did not go away with one nitroglycerin. He came to the emergency room. After additional nitroglycerin, his pain resolved. He feels like something is not right in his chest. The episode he had this morning is different from discomfort that he has had in the past. His troponin was mildly elevated. We are asked to further evaluate.  Past Medical History  Diagnosis Date  . Hyperlipidemia   . Coronary artery disease   . AAA (abdominal aortic aneurysm)   . PAD (peripheral artery disease)   . COPD (chronic obstructive pulmonary disease)   . Raynaud's disease /phenomenon   . Lumbar degenerative disc disease   . Stroke 10/15/2005  . PONV (postoperative nausea and vomiting)   . Hypertension     takes meds daily  . Anxiety   . GERD (gastroesophageal reflux disease)   . History of blood transfusion     Past Surgical History  Procedure Laterality Date  . Iliac artery stent  10/15/2005     Left common and external   . Cardiac catheterization  2006  . Carotid endarterectomy      bilateral  . Carotid endarterectomy  04/13/2005    Right and 06/03/2005 Left  . Abdominal angiogram  03/30/2012  . Hernia repair    . Abdominal aortagram N/A 03/30/2012    Procedure: ABDOMINAL Maxcine Ham;  Surgeon: Conrad Gallina, MD;  Location: [facility] CATH LAB;  Service: Cardiovascular;  Laterality: N/A;    Family History  Problem Relation Age of Onset  . Coronary artery disease Father 66    HTN, Hyperlipidemia died  . Heart disease Father     Heart Disease before age 62  . Hypertension Father   . Hyperlipidemia Father   . Peripheral vascular  disease Father   . Other Mother 46     died rheumatoid arthritis  . Dementia Mother   . Heart disease Brother   . Hypertension Brother   . Hyperlipidemia Brother    Social History:  reports that he quit smoking about 9 years ago. His smoking use included Cigarettes. He has never used smokeless tobacco. He reports that he drinks about 14.4 oz of alcohol per week. He reports that he does not use illicit drugs.  Allergies:  Allergies  Allergen Reactions  . Norvasc [Amlodipine Besylate] Swelling       . Pletal [Cilostazol] Diarrhea     (Not in a hospital admission)  Results for orders placed or performed during the hospital encounter of 12/07/14 (from the past 48 hour(s))  Troponin I     Status: Abnormal   Collection Time: 12/07/14 10:28 AM  Result Value Ref Range   Troponin I 0.04 (H) <0.031 ng/mL    Comment:        PERSISTENTLY INCREASED TROPONIN VALUES IN THE RANGE OF 0.04-0.49 ng/mL CAN BE SEEN IN:       -UNSTABLE ANGINA       -CONGESTIVE HEART FAILURE       -MYOCARDITIS       -CHEST TRAUMA       -ARRYHTHMIAS       -LATE PRESENTING MYOCARDIAL INFARCTION       -  COPD   CLINICAL FOLLOW-UP RECOMMENDED.   CBC with Differential/Platelet     Status: Abnormal   Collection Time: 12/07/14 10:28 AM  Result Value Ref Range   WBC 8.6 4.0 - 10.5 K/uL   RBC 3.51 (L) 4.22 - 5.81 MIL/uL   Hemoglobin 12.3 (L) 13.0 - 17.0 g/dL   HCT 35.0 (L) 39.0 - 52.0 %   MCV 99.7 78.0 - 100.0 fL   MCH 35.0 (H) 26.0 - 34.0 pg   MCHC 35.1 30.0 - 36.0 g/dL   RDW 12.6 11.5 - 15.5 %   Platelets 216 150 - 400 K/uL   Neutrophils Relative % 75 43 - 77 %   Neutro Abs 6.4 1.7 - 7.7 K/uL   Lymphocytes Relative 11 (L) 12 - 46 %   Lymphs Abs 0.9 0.7 - 4.0 K/uL   Monocytes Relative 12 3 - 12 %   Monocytes Absolute 1.0 0.1 - 1.0 K/uL   Eosinophils Relative 3 0 - 5 %   Eosinophils Absolute 0.2 0.0 - 0.7 K/uL   Basophils Relative 1 0 - 1 %   Basophils Absolute 0.0 0.0 - 0.1 K/uL  Basic metabolic panel      Status: Abnormal   Collection Time: 12/07/14 10:28 AM  Result Value Ref Range   Sodium 131 (L) 135 - 145 mmol/L   Potassium 4.4 3.5 - 5.1 mmol/L   Chloride 98 (L) 101 - 111 mmol/L   CO2 22 22 - 32 mmol/L   Glucose, Bld 104 (H) 65 - 99 mg/dL   BUN 14 6 - 20 mg/dL   Creatinine, Ser 1.15 0.61 - 1.24 mg/dL   Calcium 9.1 8.9 - 10.3 mg/dL   GFR calc non Af Amer >60 >60 mL/min   GFR calc Af Amer >60 >60 mL/min    Comment: (NOTE) The eGFR has been calculated using the CKD EPI equation. This calculation has not been validated in all clinical situations. eGFR's persistently <60 mL/min signify possible Chronic Kidney Disease.    Anion gap 11 5 - 15  Lipase, blood     Status: None   Collection Time: 12/07/14 10:28 AM  Result Value Ref Range   Lipase 38 22 - 51 U/L   Dg Chest 2 View  12/07/2014   CLINICAL DATA:  Acute on chronic central chest tightness. History of hypertension, AAA, CAD, COPD, stroke, GERD.  EXAM: CHEST  2 VIEW  COMPARISON:  05/29/2012  FINDINGS: Heart size is normal. There is mild perihilar bronchitic change. Biapical pleural parenchymal changes are stable. There are no focal consolidations or pleural effusions. No pulmonary edema. Visualized osseous structures have a normal appearance.  IMPRESSION: 1. Bronchitic changes. 2.  No focal acute pulmonary abnormality.   Electronically Signed   By: Nolon Nations M.D.   On: 12/07/2014 11:17    ROS: Chest discomfort as noted above, no diaphoresis, no edema. All other systems negative.  OBJECTIVE:   Vitals:   Filed Vitals:   12/07/14 1200 12/07/14 1230 12/07/14 1300 12/07/14 1429  BP: 164/81 168/84 177/76 155/75  Pulse: 67 64 65 72  Temp:      TempSrc:      Resp: 11 13  15   Height:      Weight:      SpO2: 98% 98% 98% 97%   I&O's:  No intake or output data in the 24 hours ending 12/07/14 1516 TELEMETRY: Reviewed telemetry pt in normal sinus rhythm:     PHYSICAL EXAM General: Well developed, well nourished,  in no  acute distress Head:   Normal cephalic and atramatic  Lungs:   Clear bilaterally to auscultation. Heart:   HRRR S1 S2  No JVD.   Abdomen: abdomen soft and non-tender Msk:  Back normal,  Normal strength and tone for age. Extremities:   No edema.  Diminished pulses in bilateral lower extremities, 2 + right radial pulse Neuro: Alert and oriented. Psych:  Normal affect, responds appropriately Skin: no rash  LABS: Basic Metabolic Panel:  Recent Labs  12/07/14 1028  NA 131*  K 4.4  CL 98*  CO2 22  GLUCOSE 104*  BUN 14  CREATININE 1.15  CALCIUM 9.1   Liver Function Tests: No results for input(s): AST, ALT, ALKPHOS, BILITOT, PROT, ALBUMIN in the last 72 hours.  Recent Labs  12/07/14 1028  LIPASE 38   CBC:  Recent Labs  12/07/14 1028  WBC 8.6  NEUTROABS 6.4  HGB 12.3*  HCT 35.0*  MCV 99.7  PLT 216   Cardiac Enzymes:  Recent Labs  12/07/14 1028  TROPONINI 0.04*   BNP: Invalid input(s): POCBNP D-Dimer: No results for input(s): DDIMER in the last 72 hours. Hemoglobin A1C: No results for input(s): HGBA1C in the last 72 hours. Fasting Lipid Panel: No results for input(s): CHOL, HDL, LDLCALC, TRIG, CHOLHDL, LDLDIRECT in the last 72 hours. Thyroid Function Tests: No results for input(s): TSH, T4TOTAL, T3FREE, THYROIDAB in the last 72 hours.  Invalid input(s): FREET3 Anemia Panel: No results for input(s): VITAMINB12, FOLATE, FERRITIN, TIBC, IRON, RETICCTPCT in the last 72 hours. Coag Panel:   Lab Results  Component Value Date   INR 0.98 05/29/2012       Assessment/Plan 1) symptoms concerning for unstable angina. Known coronary artery disease.  Plan for cardiac cath on Monday. Cycle enzymes. Start IV heparin. He is already on IV nitroglycerin. Continue this for relief of his chest tightness.  2) continue lipid-lowering therapy with LDL target less than 100. He is taking Crestor.  3) he is already on Coreg and isosorbide for anti-anginal therapy.  4) he  has had issues with hyponatremia. He was fluid restricted but states that over July 4, he did take in more fluid. We'll have to follow his hyponatremia.  5) PAD including AAA. He is followed at [facility] for his abdominal aortic aneurysm. If he cannot make the upcoming appointment that he has on Thursday, his wife would like to know soon as possible and she will cancel that appointment.  VARANASI,JAYADEEP S. 12/07/2014, 3:16 PM

## 2014-12-07 NOTE — Progress Notes (Signed)
ANTICOAGULATION CONSULT NOTE - Initial Consult  Pharmacy Consult for heparin Indication: chest pain/ACS  Allergies  Allergen Reactions  . Norvasc [Amlodipine Besylate] Swelling       . Pletal [Cilostazol] Diarrhea    Patient Measurements: Height: 5\' 11"  (180.3 cm) Weight: 200 lb (90.719 kg) IBW/kg (Calculated) : 75.3 Heparin Dosing Weight: 90kg  Vital Signs: Temp: 97.5 F (36.4 C) (07/16 1022) Temp Source: Oral (07/16 1022) BP: 177/76 mmHg (07/16 1300) Pulse Rate: 65 (07/16 1300)  Labs:  Recent Labs  12/07/14 1028  HGB 12.3*  HCT 35.0*  PLT 216  CREATININE 1.15  TROPONINI 0.04*    Estimated Creatinine Clearance: 72.8 mL/min (by C-G formula based on Cr of 1.15).   Medical History: Past Medical History  Diagnosis Date  . Hyperlipidemia   . Coronary artery disease   . AAA (abdominal aortic aneurysm)   . PAD (peripheral artery disease)   . COPD (chronic obstructive pulmonary disease)   . Raynaud's disease /phenomenon   . Lumbar degenerative disc disease   . Stroke 10/15/2005  . PONV (postoperative nausea and vomiting)   . Hypertension     takes meds daily  . Anxiety   . GERD (gastroesophageal reflux disease)   . History of blood transfusion     Medications:  Infusions:  . sodium chloride 20 mL/hr at 12/07/14 1131  . heparin      Assessment: 67 yof presented to the ED with worsening CP. To start IV heparin for anticoagulation. Troponin mildly elevated and H/H slightly low but plts are WNL. He is not on anticoagulation at home.    Goal of Therapy:  Heparin level 0.3-0.7 units/ml Monitor platelets by anticoagulation protocol: Yes   Plan:  - Heparin bolus 4000 units IV x 1 - Heparin gtt 1100 units/hr - Check a 6 hour heparin level - Daily heparin level and CBC  Tiasha Helvie, Rande Lawman 12/07/2014,2:05 PM

## 2014-12-07 NOTE — Progress Notes (Signed)
ANTICOAGULATION CONSULT NOTE   Pharmacy Consult for heparin Indication: chest pain/ACS  Patient Measurements: Height: 5' 10.5" (179.1 cm) Weight: 197 lb 12.8 oz (89.721 kg) IBW/kg (Calculated) : 74.15 Heparin Dosing Weight: 90kg  Labs:  Recent Labs  12/07/14 1028 12/07/14 1800 12/07/14 2051  HGB 12.3*  --   --   HCT 35.0*  --   --   PLT 216  --   --   APTT  --  117*  --   LABPROT  --  15.2  --   INR  --  1.18  --   HEPARINUNFRC  --   --  0.36  CREATININE 1.15  --   --   TROPONINI 0.04* 2.65*  --     Estimated Creatinine Clearance: 71.9 mL/min (by C-G formula based on Cr of 1.15).  Assessment: 87 yof presented to the ED with worsening CP. To start IV heparin for anticoagulation. Troponin mildly elevated and H/H slightly low but plts are WNL. He is not on anticoagulation at home.   Initial heparin level is at goal (0.36) on 1100/hr. TpI peaked at 2.6 this evening. No bleeding issues noted. Continue heparin at current rate.   Goal of Therapy:  Heparin level 0.3-0.7 units/ml Monitor platelets by anticoagulation protocol: Yes   Plan:  - Heparin gtt 1100 units/hr - Daily heparin level and CBC  Erin Hearing PharmD., BCPS Clinical Pharmacist Pager [phone] 12/07/2014 10:06 PM

## 2014-12-07 NOTE — Progress Notes (Signed)
Pt troponin 2.65.  Pt denies chest pain, IV NTG and heparin infusing.  Dr. Claiborne Billings notified with no new orders.  Will continue to monitor closely.

## 2014-12-07 NOTE — ED Notes (Signed)
Pt arrived with wife POV and c/o CP/Chest heaviness. Pt stated that the heaviness started about 0700 this morning and he had nitro x 2 with no relief. Pt was diaphoretic when CP started. Denies any nausea, dizziness, or SOB. Pain is in the center of chest.

## 2014-12-08 DIAGNOSIS — I214 Non-ST elevation (NSTEMI) myocardial infarction: Principal | ICD-10-CM

## 2014-12-08 LAB — CBC
HCT: 32.1 % — ABNORMAL LOW (ref 39.0–52.0)
HEMOGLOBIN: 11.6 g/dL — AB (ref 13.0–17.0)
MCH: 36.3 pg — ABNORMAL HIGH (ref 26.0–34.0)
MCHC: 36.1 g/dL — ABNORMAL HIGH (ref 30.0–36.0)
MCV: 100.3 fL — AB (ref 78.0–100.0)
PLATELETS: 210 10*3/uL (ref 150–400)
RBC: 3.2 MIL/uL — AB (ref 4.22–5.81)
RDW: 12.6 % (ref 11.5–15.5)
WBC: 9.4 10*3/uL (ref 4.0–10.5)

## 2014-12-08 LAB — BASIC METABOLIC PANEL
ANION GAP: 10 (ref 5–15)
BUN: 13 mg/dL (ref 6–20)
CALCIUM: 8.8 mg/dL — AB (ref 8.9–10.3)
CO2: 22 mmol/L (ref 22–32)
Chloride: 97 mmol/L — ABNORMAL LOW (ref 101–111)
Creatinine, Ser: 1.2 mg/dL (ref 0.61–1.24)
Glucose, Bld: 100 mg/dL — ABNORMAL HIGH (ref 65–99)
POTASSIUM: 4 mmol/L (ref 3.5–5.1)
SODIUM: 129 mmol/L — AB (ref 135–145)

## 2014-12-08 LAB — HEPARIN LEVEL (UNFRACTIONATED)
HEPARIN UNFRACTIONATED: 0.24 [IU]/mL — AB (ref 0.30–0.70)
HEPARIN UNFRACTIONATED: 0.25 [IU]/mL — AB (ref 0.30–0.70)

## 2014-12-08 LAB — LIPID PANEL
CHOLESTEROL: 107 mg/dL (ref 0–200)
HDL: 55 mg/dL (ref >40)
LDL Cholesterol: 41 mg/dL (ref 0–99)
TRIGLYCERIDES: 57 mg/dL (ref <150)
Total CHOL/HDL Ratio: 1.9 RATIO
VLDL: 11 mg/dL (ref 0–40)

## 2014-12-08 LAB — TROPONIN I
TROPONIN I: 5.19 ng/mL — AB (ref <0.031)
TROPONIN I: 7.83 ng/mL — AB (ref <0.031)

## 2014-12-08 MED ORDER — HEPARIN BOLUS VIA INFUSION
1000.0000 [IU] | Freq: Once | INTRAVENOUS | Status: AC
  Administered 2014-12-08: 1000 [IU] via INTRAVENOUS
  Filled 2014-12-08: qty 1000

## 2014-12-08 MED ORDER — NITROGLYCERIN IN D5W 200-5 MCG/ML-% IV SOLN
0.0000 ug/min | Freq: Once | INTRAVENOUS | Status: AC
  Administered 2014-12-08: 25 ug/min via INTRAVENOUS

## 2014-12-08 MED ORDER — NITROGLYCERIN IN D5W 200-5 MCG/ML-% IV SOLN
INTRAVENOUS | Status: AC
  Administered 2014-12-08: 25 ug/min via INTRAVENOUS
  Filled 2014-12-08: qty 250

## 2014-12-08 NOTE — Progress Notes (Signed)
Pts troponin elevated to 7.38. Dr. Claiborne Billings on call made aware. Pt pain free. Cont to monitor.

## 2014-12-08 NOTE — Progress Notes (Signed)
Utilization Review Completed.Forney Kleinpeter T7/17/2016  

## 2014-12-08 NOTE — Progress Notes (Addendum)
ANTICOAGULATION CONSULT NOTE   Pharmacy Consult for heparin Indication: chest pain/ACS  Patient Measurements: Height: 5' 10.5" (179.1 cm) Weight: 197 lb 12.8 oz (89.721 kg) IBW/kg (Calculated) : 74.15 Heparin Dosing Weight: 90kg  Labs:  Recent Labs  12/07/14 1028 12/07/14 1800 12/07/14 2051 12/07/14 2317 12/08/14 0443 12/08/14 1047  HGB 12.3*  --   --   --  11.6*  --   HCT 35.0*  --   --   --  32.1*  --   PLT 216  --   --   --  210  --   APTT  --  117*  --   --   --   --   LABPROT  --  15.2  --   --   --   --   INR  --  1.18  --   --   --   --   HEPARINUNFRC  --   --  0.36  --   --  0.24*  CREATININE 1.15  --   --   --  1.20  --   TROPONINI 0.04* 2.65*  --  5.19* 7.83*  --     Estimated Creatinine Clearance: 68.9 mL/min (by C-G formula based on Cr of 1.2).  Assessment: 15 yof presented to the ED with worsening CP. To start IV heparin for CP/concern for unstable angina. Troponin mildly elevated and H/H slightly low but plts are WNL. He is not on anticoagulation at home.   Heparin level is low today (0.24) on 1100/hr. TpI peaked at 7.9. No bleeding or IV line issues noted per RN. Plan for cath on Monday.   Goal of Therapy:  Heparin level 0.3-0.7 units/ml Monitor platelets by anticoagulation protocol: Yes   Plan:  - Increase Heparin gtt to 1300 units/hr - 6h HL - Daily HL and CBC - Mon s/sx bleeding - Cath planned Monday  Elicia Lamp, PharmD Clinical Pharmacist - Resident Pager [phone] 12/08/2014 1:02 PM  ____________________________________ Addendum:  2nd: HL low at 0.25. Some blood is dripping from IV line, but RN said it is minimal. Bolus 1000 units and increase infusion to 1450 units/hr. F/U HL at 0200 and any worsening bleeding  Levester Fresh, PharmD, [facility] Clinical Pharmacist Pager [phone] 12/08/2014 7:48 PM

## 2014-12-08 NOTE — Progress Notes (Signed)
+   SUBJECTIVE:  No further chest pain.  No SOB.   PHYSICAL EXAM Filed Vitals:   12/08/14 0500 12/08/14 0631 12/08/14 0821 12/08/14 1005  BP: 135/69 138/70 120/73 139/72  Pulse: 68 70 71   Temp: 98 F (36.7 C)  98.3 F (36.8 C)   TempSrc:   Oral   Resp: 16     Height:      Weight:      SpO2: 94%  93%    General:  No distress Lungs:  Clear Heart:  RRR Abdomen:  Positive bowel sounds, no rebound no guarding Extremities:  No edema  LABS: Lab Results  Component Value Date   TROPONINI 7.83* 12/08/2014   Results for orders placed or performed during the hospital encounter of 12/07/14 (from the past 24 hour(s))  Protime-INR     Status: None   Collection Time: 12/07/14  6:00 PM  Result Value Ref Range   Prothrombin Time 15.2 11.6 - 15.2 seconds   INR 1.18 0.00 - 1.49  APTT     Status: Abnormal   Collection Time: 12/07/14  6:00 PM  Result Value Ref Range   aPTT 117 (H) 24 - 37 seconds  Troponin I     Status: Abnormal   Collection Time: 12/07/14  6:00 PM  Result Value Ref Range   Troponin I 2.65 (HH) <0.031 ng/mL  Heparin level (unfractionated)     Status: None   Collection Time: 12/07/14  8:51 PM  Result Value Ref Range   Heparin Unfractionated 0.36 0.30 - 0.70 IU/mL  Troponin I     Status: Abnormal   Collection Time: 12/07/14 11:17 PM  Result Value Ref Range   Troponin I 5.19 (HH) <0.031 ng/mL  Troponin I     Status: Abnormal   Collection Time: 12/08/14  4:43 AM  Result Value Ref Range   Troponin I 7.83 (HH) <0.031 ng/mL  Basic metabolic panel     Status: Abnormal   Collection Time: 12/08/14  4:43 AM  Result Value Ref Range   Sodium 129 (L) 135 - 145 mmol/L   Potassium 4.0 3.5 - 5.1 mmol/L   Chloride 97 (L) 101 - 111 mmol/L   CO2 22 22 - 32 mmol/L   Glucose, Bld 100 (H) 65 - 99 mg/dL   BUN 13 6 - 20 mg/dL   Creatinine, Ser 1.20 0.61 - 1.24 mg/dL   Calcium 8.8 (L) 8.9 - 10.3 mg/dL   GFR calc non Af Amer >60 >60 mL/min   GFR calc Af Amer >60 >60 mL/min   Anion gap 10 5 - 15  CBC     Status: Abnormal   Collection Time: 12/08/14  4:43 AM  Result Value Ref Range   WBC 9.4 4.0 - 10.5 K/uL   RBC 3.20 (L) 4.22 - 5.81 MIL/uL   Hemoglobin 11.6 (L) 13.0 - 17.0 g/dL   HCT 32.1 (L) 39.0 - 52.0 %   MCV 100.3 (H) 78.0 - 100.0 fL   MCH 36.3 (H) 26.0 - 34.0 pg   MCHC 36.1 (H) 30.0 - 36.0 g/dL   RDW 12.6 11.5 - 15.5 %   Platelets 210 150 - 400 K/uL  Lipid panel     Status: None   Collection Time: 12/08/14  4:43 AM  Result Value Ref Range   Cholesterol 107 0 - 200 mg/dL   Triglycerides 57 <150 mg/dL   HDL 55 >40 mg/dL   Total CHOL/HDL Ratio 1.9 RATIO   VLDL 11 0 -  40 mg/dL   LDL Cholesterol 41 0 - 99 mg/dL    Intake/Output Summary (Last 24 hours) at 12/08/14 1043 Last data filed at 12/08/14 0500  Gross per 24 hour  Intake    277 ml  Output    250 ml  Net     27 ml    ASSESSMENT AND PLAN:  NQWMI:  Cath on Monday.   Troponin still rising.  No pain since admission however.  The patient understands that risks included but are not limited to stroke (1 in 1000), death (1 in 80), kidney failure [usually temporary] (1 in 500), bleeding (1 in 200), allergic reaction [possibly serious] (1 in 200).  The patient understands and agrees to proceed.    HYPONATREMIA:  Chronic.  Continue to follow.    Jeneen Rinks [facility] 12/08/2014 10:43 AM

## 2014-12-09 ENCOUNTER — Encounter (HOSPITAL_COMMUNITY)
Admission: EM | Disposition: A | Discharge status: Home Health | Payer: Medicare Other | Source: Home / Self Care | Attending: Interventional Cardiology

## 2014-12-09 ENCOUNTER — Encounter (HOSPITAL_COMMUNITY): Payer: Self-pay | Admitting: Physician Assistant

## 2014-12-09 DIAGNOSIS — E871 Hypo-osmolality and hyponatremia: Secondary | ICD-10-CM | POA: Diagnosis present

## 2014-12-09 DIAGNOSIS — I2510 Atherosclerotic heart disease of native coronary artery without angina pectoris: Secondary | ICD-10-CM

## 2014-12-09 DIAGNOSIS — I214 Non-ST elevation (NSTEMI) myocardial infarction: Secondary | ICD-10-CM | POA: Diagnosis present

## 2014-12-09 HISTORY — PX: CARDIAC CATHETERIZATION: SHX172

## 2014-12-09 LAB — CBC
HEMATOCRIT: 31.4 % — AB (ref 39.0–52.0)
HEMOGLOBIN: 11 g/dL — AB (ref 13.0–17.0)
MCH: 34.8 pg — ABNORMAL HIGH (ref 26.0–34.0)
MCHC: 35 g/dL (ref 30.0–36.0)
MCV: 99.4 fL (ref 78.0–100.0)
Platelets: 195 10*3/uL (ref 150–400)
RBC: 3.16 MIL/uL — ABNORMAL LOW (ref 4.22–5.81)
RDW: 12.6 % (ref 11.5–15.5)
WBC: 9.8 10*3/uL (ref 4.0–10.5)

## 2014-12-09 LAB — MRSA PCR SCREENING: MRSA by PCR: NEGATIVE

## 2014-12-09 LAB — HEPARIN LEVEL (UNFRACTIONATED): Heparin Unfractionated: 0.52 IU/mL (ref 0.30–0.70)

## 2014-12-09 LAB — HEMOGLOBIN A1C
Hgb A1c MFr Bld: 5.3 % (ref 4.8–5.6)
Mean Plasma Glucose: 105 mg/dL

## 2014-12-09 SURGERY — LEFT HEART CATH AND CORONARY ANGIOGRAPHY

## 2014-12-09 MED ORDER — SODIUM CHLORIDE 0.9 % IJ SOLN: 3.0000 mL | INTRAMUSCULAR | Status: DC | PRN

## 2014-12-09 MED ORDER — HYDRALAZINE HCL 20 MG/ML IJ SOLN
10.0000 mg | INTRAMUSCULAR | Status: DC | PRN
  Administered 2014-12-09: 10 mg via INTRAVENOUS
  Filled 2014-12-09: qty 1

## 2014-12-09 MED ORDER — SODIUM CHLORIDE 0.9 % IV SOLN: 250.0000 mL | INTRAVENOUS | Status: DC | PRN

## 2014-12-09 MED ORDER — NITROGLYCERIN IN D5W 200-5 MCG/ML-% IV SOLN
0.0000 ug/min | INTRAVENOUS | Status: DC
  Administered 2014-12-09: 10 ug/min via INTRAVENOUS
  Administered 2014-12-11: 20 ug/min via INTRAVENOUS
  Administered 2014-12-13: 25 ug/min via INTRAVENOUS
  Filled 2014-12-09 (×3): qty 250

## 2014-12-09 MED ORDER — FAMOTIDINE 20 MG PO TABS
20.0000 mg | ORAL_TABLET | Freq: Every day | ORAL | Status: DC
  Administered 2014-12-09 – 2014-12-12 (×4): 20 mg via ORAL
  Filled 2014-12-09 (×5): qty 1

## 2014-12-09 MED ORDER — VERAPAMIL HCL 2.5 MG/ML IV SOLN
INTRAVENOUS | Status: AC
  Filled 2014-12-09: qty 2

## 2014-12-09 MED ORDER — HEPARIN SODIUM (PORCINE) 1000 UNIT/ML IJ SOLN
INTRAMUSCULAR | Status: AC
  Filled 2014-12-09: qty 1

## 2014-12-09 MED ORDER — HYDRALAZINE HCL 25 MG PO TABS
25.0000 mg | ORAL_TABLET | Freq: Two times a day (BID) | ORAL | Status: DC
  Administered 2014-12-09: 25 mg via ORAL
  Filled 2014-12-09 (×3): qty 1

## 2014-12-09 MED ORDER — LIDOCAINE HCL (PF) 1 % IJ SOLN
INTRAMUSCULAR | Status: DC | PRN
  Administered 2014-12-09: 15:00:00

## 2014-12-09 MED ORDER — MIDAZOLAM HCL 2 MG/2ML IJ SOLN
INTRAMUSCULAR | Status: DC | PRN
  Administered 2014-12-09: 2 mg via INTRAVENOUS

## 2014-12-09 MED ORDER — SODIUM CHLORIDE 0.9 % IJ SOLN
3.0000 mL | Freq: Two times a day (BID) | INTRAMUSCULAR | Status: DC
Start: 2014-12-09 — End: 2014-12-13
  Administered 2014-12-09 – 2014-12-12 (×6): 3 mL via INTRAVENOUS

## 2014-12-09 MED ORDER — FENTANYL CITRATE (PF) 100 MCG/2ML IJ SOLN
INTRAMUSCULAR | Status: AC
  Filled 2014-12-09: qty 2

## 2014-12-09 MED ORDER — HEPARIN (PORCINE) IN NACL 100-0.45 UNIT/ML-% IJ SOLN
1450.0000 [IU]/h | INTRAMUSCULAR | Status: DC
  Administered 2014-12-10 – 2014-12-12 (×2): 1450 [IU]/h via INTRAVENOUS
  Filled 2014-12-09 (×8): qty 250

## 2014-12-09 MED ORDER — HEPARIN SODIUM (PORCINE) 1000 UNIT/ML IJ SOLN
INTRAMUSCULAR | Status: DC | PRN
  Administered 2014-12-09: 4000 [IU] via INTRAVENOUS

## 2014-12-09 MED ORDER — ROSUVASTATIN CALCIUM 20 MG PO TABS
20.0000 mg | ORAL_TABLET | Freq: Every day | ORAL | Status: DC
  Administered 2014-12-09 – 2014-12-17 (×8): 20 mg via ORAL
  Filled 2014-12-09 (×11): qty 1

## 2014-12-09 MED ORDER — CYCLOBENZAPRINE HCL 10 MG PO TABS: 5.0000 mg | ORAL_TABLET | Freq: Three times a day (TID) | ORAL | Status: DC | PRN

## 2014-12-09 MED ORDER — LISINOPRIL 20 MG PO TABS
20.0000 mg | ORAL_TABLET | Freq: Two times a day (BID) | ORAL | Status: DC
  Administered 2014-12-09 – 2014-12-12 (×7): 20 mg via ORAL
  Filled 2014-12-09 (×9): qty 1

## 2014-12-09 MED ORDER — FENTANYL CITRATE (PF) 100 MCG/2ML IJ SOLN
INTRAMUSCULAR | Status: DC | PRN
  Administered 2014-12-09: 25 ug via INTRAVENOUS

## 2014-12-09 MED ORDER — IOHEXOL 350 MG/ML SOLN
INTRAVENOUS | Status: DC | PRN
  Administered 2014-12-09: 115 mL via INTRAVENOUS

## 2014-12-09 MED ORDER — MIDAZOLAM HCL 2 MG/2ML IJ SOLN
INTRAMUSCULAR | Status: AC
  Filled 2014-12-09: qty 2

## 2014-12-09 MED ORDER — ACETAMINOPHEN 325 MG PO TABS: 650.0000 mg | ORAL_TABLET | ORAL | Status: DC | PRN

## 2014-12-09 MED ORDER — NITROGLYCERIN IN D5W 200-5 MCG/ML-% IV SOLN
INTRAVENOUS | Status: DC | PRN
  Administered 2014-12-09: 10 ug/min via INTRAVENOUS

## 2014-12-09 MED ORDER — HEPARIN (PORCINE) IN NACL 2-0.9 UNIT/ML-% IJ SOLN
INTRAMUSCULAR | Status: AC
  Filled 2014-12-09: qty 1500

## 2014-12-09 MED ORDER — LIDOCAINE HCL (PF) 1 % IJ SOLN
INTRAMUSCULAR | Status: AC
  Filled 2014-12-09: qty 30

## 2014-12-09 MED ORDER — ROSUVASTATIN CALCIUM 10 MG PO TABS: 40.0000 mg | ORAL_TABLET | Freq: Every day | ORAL | Status: DC

## 2014-12-09 MED ORDER — CARVEDILOL 25 MG PO TABS
37.5000 mg | ORAL_TABLET | Freq: Two times a day (BID) | ORAL | Status: DC
  Administered 2014-12-09 – 2014-12-12 (×7): 37.5 mg via ORAL
  Filled 2014-12-09 (×10): qty 1

## 2014-12-09 MED ORDER — SODIUM CHLORIDE 0.9 % IV SOLN
INTRAVENOUS | Status: AC
  Administered 2014-12-09: 16:00:00 via INTRAVENOUS

## 2014-12-09 MED ORDER — ONDANSETRON HCL 4 MG/2ML IJ SOLN: 4.0000 mg | Freq: Four times a day (QID) | INTRAMUSCULAR | Status: DC | PRN

## 2014-12-09 SURGICAL SUPPLY — 17 items
CATH INFINITI 5 FR JL3.5 (CATHETERS) ×1 IMPLANT
CATH INFINITI 5FR AL1 (CATHETERS) ×2 IMPLANT
CATH INFINITI 5FR ANG PIGTAIL (CATHETERS) ×3 IMPLANT
CATH INFINITI 5FR MULTPACK ANG (CATHETERS) IMPLANT
CATH INFINITI JR4 5F (CATHETERS) ×3 IMPLANT
DEVICE RAD COMP TR BAND LRG (VASCULAR PRODUCTS) ×3 IMPLANT
GLIDESHEATH SLEND SS 6F .021 (SHEATH) ×3 IMPLANT
KIT HEART LEFT (KITS) ×3 IMPLANT
PACK CARDIAC CATHETERIZATION (CUSTOM PROCEDURE TRAY) ×3 IMPLANT
SHEATH PINNACLE 5F 10CM (SHEATH) IMPLANT
SYR MEDRAD MARK V 150ML (SYRINGE) ×3 IMPLANT
TRANSDUCER W/STOPCOCK (MISCELLANEOUS) ×3 IMPLANT
TUBING CIL FLEX 10 FLL-RA (TUBING) ×3 IMPLANT
WIRE AMPLATZ SS-J .035X260CM (WIRE) ×2 IMPLANT
WIRE EMERALD 3MM-J .035X150CM (WIRE) IMPLANT
WIRE HI TORQ VERSACORE-J 145CM (WIRE) ×2 IMPLANT
WIRE SAFE-T 1.5MM-J .035X260CM (WIRE) ×3 IMPLANT

## 2014-12-09 NOTE — Progress Notes (Signed)
[place] for heparin Indication: chest pain/ACS- awaiting CABG  Allergies  Allergen Reactions  . Norvasc [Amlodipine Besylate] Swelling       . Pletal [Cilostazol] Diarrhea    Patient Measurements: Height: 5\' 11"  (180.3 cm) Weight: 195 lb 5.2 oz (88.6 kg) IBW/kg (Calculated) : 75.3 Heparin Dosing Weight: 88.6kg  Vital Signs: Temp: 98.6 F (37 C) (07/18 0734) Temp Source: Oral (07/18 0734) BP: 120/61 mmHg (07/18 1800) Pulse Rate: 70 (07/18 1800)  Labs:  Recent Labs  12/07/14 1028 12/07/14 1800  12/07/14 2317 12/08/14 0443 12/08/14 1047 12/08/14 1831 12/09/14 0135  HGB 12.3*  --   --   --  11.6*  --   --  11.0*  HCT 35.0*  --   --   --  32.1*  --   --  31.4*  PLT 216  --   --   --  210  --   --  195  APTT  --  117*  --   --   --   --   --   --   LABPROT  --  15.2  --   --   --   --   --   --   INR  --  1.18  --   --   --   --   --   --   HEPARINUNFRC  --   --   < >  --   --  0.24* 0.25* 0.52  CREATININE 1.15  --   --   --  1.20  --   --   --   TROPONINI 0.04* 2.65*  --  5.19* 7.83*  --   --   --   < > = values in this interval not displayed.  Estimated Creatinine Clearance: 64.5 mL/min (by C-G formula based on Cr of 1.2).  Assessment: 15 yof presented to the ED with worsening CP. Now s/p cath which showed extensive vessel disease- CVTS consulted for CABG. To resume heparin 2 hours post TR band removal. Was therapeutic on heparin at 1450 units/hr prior to cath. No extensive bleeding noted from cath site. TR band off at 1800.   Goal of Therapy:  Heparin level 0.3-0.7 units/ml Monitor platelets by anticoagulation protocol: Yes   Plan:  - restart heparin at 1450units/hr at 2000 tonight - HL 6 hours after restart - daily HL and CBC - follow CVTS and CABG plans  Taiyo Kozma D. Cniyah Sproull, PharmD, BCPS Clinical Pharmacist Pager: [phone] 12/09/2014 7:04 PM

## 2014-12-09 NOTE — Progress Notes (Signed)
TR band removed at this time. No active bleeding.

## 2014-12-09 NOTE — Care Management Important Message (Signed)
Important Message  Patient Details  Name: [name] MRN: [id number] Date of Birth: 1950/11/12   Medicare Important Message Given:  Yes-second notification given    Pricilla Handler 12/09/2014, 3:12 PM

## 2014-12-09 NOTE — Progress Notes (Signed)
Patient Name: [name] Date of Encounter: 12/09/2014  Principal Problem:   NSTEMI (non-ST elevated myocardial infarction) Active Problems:   Essential hypertension, benign   Pure hypercholesterolemia   PAD (peripheral artery disease)   AAA (abdominal aortic aneurysm) without rupture   Hyponatremia   Primary Cardiologist: Dr Burt Knack, L. Servando Snare, NP  Patient Profile: 64 yo male w/ hx PVD, RCA 100%, L>R collat, AAA (no surgery), HL, HTN, OA, hyponatremia. Admitted 07/16 w NSTEMI  SUBJECTIVE: No chest pain or SOB.  OBJECTIVE Filed Vitals:   12/08/14 2000 12/09/14 0051 12/09/14 0500 12/09/14 0734  BP: 123/65 103/59 143/71 140/71  Pulse: 73 75 74 68  Temp: 98.8 F (37.1 C) 99.3 F (37.4 C) 98.6 F (37 C) 98.6 F (37 C)  TempSrc:    Oral  Resp: 18 20 18 18   Height:      Weight:   193 lb (87.544 kg)   SpO2: 93% 97% 94% 92%    Intake/Output Summary (Last 24 hours) at 12/09/14 1052 Last data filed at 12/09/14 0500  Gross per 24 hour  Intake      0 ml  Output    300 ml  Net   -300 ml   Filed Weights   12/07/14 1036 12/07/14 1647 12/09/14 0500  Weight: 200 lb (90.719 kg) 197 lb 12.8 oz (89.721 kg) 193 lb (87.544 kg)    PHYSICAL EXAM General: Well developed, well nourished, male in no acute distress. Head: Normocephalic, atraumatic.  Neck: Supple without bruits, JVD not elevated. Lungs:  Resp regular and unlabored, few rales bases. Heart: RRR, S1, S2, no S3, S4, or murmur; no rub. Abdomen: Soft, non-tender, non-distended, BS + x 4.  Extremities: No clubbing, cyanosis, edema.  Neuro: Alert and oriented X 3. Moves all extremities spontaneously. Psych: Normal affect.  LABS: CBC:  Recent Labs  12/07/14 1028 12/08/14 0443 12/09/14 0135  WBC 8.6 9.4 9.8  NEUTROABS 6.4  --   --   HGB 12.3* 11.6* 11.0*  HCT 35.0* 32.1* 31.4*  MCV 99.7 100.3* 99.4  PLT 216 210 195   INR:  Recent Labs  12/07/14 1800  INR [id number]   Basic Metabolic Panel:  Recent  Labs  12/07/14 1028 12/08/14 0443  NA 131* 129*  K 4.4 4.0  CL 98* 97*  CO2 22 22  GLUCOSE 104* 100*  BUN 14 13  CREATININE 1.15 1.20  CALCIUM 9.1 8.8*   Cardiac Enzymes:  Recent Labs  12/07/14 1800 12/07/14 2317 12/08/14 0443  TROPONINI 2.65* 5.19* 7.83*   Hemoglobin A1C:  Recent Labs  12/07/14 1800  HGBA1C 5.3   Fasting Lipid Panel:  Recent Labs  12/08/14 0443  CHOL 107  HDL 55  LDLCALC 41  TRIG 57  CHOLHDL 1.9   TELE:  SR, no sig ectopy  Radiology/Studies: Dg Chest 2 View 12/07/2014   CLINICAL DATA:  Acute on chronic central chest tightness. History of hypertension, AAA, CAD, COPD, stroke, GERD.  EXAM: CHEST  2 VIEW  COMPARISON:  05/29/2012  FINDINGS: Heart size is normal. There is mild perihilar bronchitic change. Biapical pleural parenchymal changes are stable. There are no focal consolidations or pleural effusions. No pulmonary edema. Visualized osseous structures have a normal appearance.  IMPRESSION: 1. Bronchitic changes. 2.  No focal acute pulmonary abnormality.   Electronically Signed   By: Nolon Nations M.D.   On: 12/07/2014 11:17     Current Medications:  . aspirin  81 mg Oral Daily  .  carvedilol  37.5 mg Oral BID WC  . clopidogrel  75 mg Oral Daily  . famotidine  20 mg Oral QHS  . hydrALAZINE  25 mg Oral BID  . isosorbide mononitrate  120 mg Oral Daily  . lisinopril  20 mg Oral BID  . rosuvastatin  20 mg Oral QHS  . sodium chloride  3 mL Intravenous Q12H   . sodium chloride 20 mL/hr at 12/07/14 1131  . sodium chloride 1 mL/kg/hr (12/09/14 0700)  . heparin 1,450 Units/hr (12/09/14 0700)    ASSESSMENT AND PLAN: Principal Problem:   NSTEMI (non-ST elevated myocardial infarction)  - ez were still trending up yest am, but pt does not wish to recheck - for cath today, known RCA 100%, may now have obstructive LAD dz (prev moderate) - continue ASA, BB, ACE, heparin, oral nitrates - wean IV nitrates off.   Active Problems:   Essential  hypertension, benign - SBP 100s-150s on current rx, HR generally OK, MD advise on increasing Coreg to 25 mg bid    Pure hypercholesterolemia - with NSTEMI, increase Crestor to high dose    PAD (peripheral artery disease)/AAA (abdominal aortic aneurysm) without rupture - has had rx iliac dz in the past, plus AAA - best access is likely radial    Hyponatremia - needs to limit ETOH - lowest value seen is 124 - continue to follow.   Jonetta Speak , PA-C 10:52 AM 12/09/2014

## 2014-12-09 NOTE — H&P (View-Only) (Signed)
+   SUBJECTIVE:  No further chest pain.  No SOB.   PHYSICAL EXAM Filed Vitals:   12/08/14 0500 12/08/14 0631 12/08/14 0821 12/08/14 1005  BP: 135/69 138/70 120/73 139/72  Pulse: 68 70 71   Temp: 98 F (36.7 C)  98.3 F (36.8 C)   TempSrc:   Oral   Resp: 16     Height:      Weight:      SpO2: 94%  93%    General:  No distress Lungs:  Clear Heart:  RRR Abdomen:  Positive bowel sounds, no rebound no guarding Extremities:  No edema  LABS: Lab Results  Component Value Date   TROPONINI 7.83* 12/08/2014   Results for orders placed or performed during the hospital encounter of 12/07/14 (from the past 24 hour(s))  Protime-INR     Status: None   Collection Time: 12/07/14  6:00 PM  Result Value Ref Range   Prothrombin Time 15.2 11.6 - 15.2 seconds   INR 1.18 0.00 - 1.49  APTT     Status: Abnormal   Collection Time: 12/07/14  6:00 PM  Result Value Ref Range   aPTT 117 (H) 24 - 37 seconds  Troponin I     Status: Abnormal   Collection Time: 12/07/14  6:00 PM  Result Value Ref Range   Troponin I 2.65 (HH) <0.031 ng/mL  Heparin level (unfractionated)     Status: None   Collection Time: 12/07/14  8:51 PM  Result Value Ref Range   Heparin Unfractionated 0.36 0.30 - 0.70 IU/mL  Troponin I     Status: Abnormal   Collection Time: 12/07/14 11:17 PM  Result Value Ref Range   Troponin I 5.19 (HH) <0.031 ng/mL  Troponin I     Status: Abnormal   Collection Time: 12/08/14  4:43 AM  Result Value Ref Range   Troponin I 7.83 (HH) <0.031 ng/mL  Basic metabolic panel     Status: Abnormal   Collection Time: 12/08/14  4:43 AM  Result Value Ref Range   Sodium 129 (L) 135 - 145 mmol/L   Potassium 4.0 3.5 - 5.1 mmol/L   Chloride 97 (L) 101 - 111 mmol/L   CO2 22 22 - 32 mmol/L   Glucose, Bld 100 (H) 65 - 99 mg/dL   BUN 13 6 - 20 mg/dL   Creatinine, Ser 1.20 0.61 - 1.24 mg/dL   Calcium 8.8 (L) 8.9 - 10.3 mg/dL   GFR calc non Af Amer >60 >60 mL/min   GFR calc Af Amer >60 >60 mL/min   Anion gap 10 5 - 15  CBC     Status: Abnormal   Collection Time: 12/08/14  4:43 AM  Result Value Ref Range   WBC 9.4 4.0 - 10.5 K/uL   RBC 3.20 (L) 4.22 - 5.81 MIL/uL   Hemoglobin 11.6 (L) 13.0 - 17.0 g/dL   HCT 32.1 (L) 39.0 - 52.0 %   MCV 100.3 (H) 78.0 - 100.0 fL   MCH 36.3 (H) 26.0 - 34.0 pg   MCHC 36.1 (H) 30.0 - 36.0 g/dL   RDW 12.6 11.5 - 15.5 %   Platelets 210 150 - 400 K/uL  Lipid panel     Status: None   Collection Time: 12/08/14  4:43 AM  Result Value Ref Range   Cholesterol 107 0 - 200 mg/dL   Triglycerides 57 <150 mg/dL   HDL 55 >40 mg/dL   Total CHOL/HDL Ratio 1.9 RATIO   VLDL 11 0 -  40 mg/dL   LDL Cholesterol 41 0 - 99 mg/dL    Intake/Output Summary (Last 24 hours) at 12/08/14 1043 Last data filed at 12/08/14 0500  Gross per 24 hour  Intake    277 ml  Output    250 ml  Net     27 ml    ASSESSMENT AND PLAN:  NQWMI:  Cath on Monday.   Troponin still rising.  No pain since admission however.  The patient understands that risks included but are not limited to stroke (1 in 1000), death (1 in 66), kidney failure [usually temporary] (1 in 500), bleeding (1 in 200), allergic reaction [possibly serious] (1 in 200).  The patient understands and agrees to proceed.    HYPONATREMIA:  Chronic.  Continue to follow.    Jeneen Rinks [facility] 12/08/2014 10:43 AM

## 2014-12-09 NOTE — Interval H&P Note (Signed)
Cath Lab Visit (complete for each Cath Lab visit)  Clinical Evaluation Leading to the Procedure:   ACS: Yes.    Non-ACS:    Anginal Classification: CCS IV  Anti-ischemic medical therapy: Maximal Therapy (2 or more classes of medications)  Non-Invasive Test Results: No non-invasive testing performed  Prior CABG: No previous CABG      History and Physical Interval Note:  12/09/2014 1:54 PM  [name]  has presented today for surgery, with the diagnosis of cp  The various methods of treatment have been discussed with the patient and family. After consideration of risks, benefits and other options for treatment, the patient has consented to  Procedure(s): Left Heart Cath and Coronary Angiography (N/A) as a surgical intervention .  The patient's history has been reviewed, patient examined, no change in status, stable for surgery.  I have reviewed the patient's chart and labs.  Questions were answered to the patient's satisfaction.     [name]

## 2014-12-09 NOTE — Progress Notes (Signed)
ANTICOAGULATION CONSULT NOTE - Follow Up Consult  Pharmacy Consult for heparin Indication: NQWMI   Labs:  Recent Labs  12/07/14 1028 12/07/14 1800  12/07/14 2317 12/08/14 0443 12/08/14 1047 12/08/14 1831 12/09/14 0135  HGB 12.3*  --   --   --  11.6*  --   --  11.0*  HCT 35.0*  --   --   --  32.1*  --   --  31.4*  PLT 216  --   --   --  210  --   --  195  APTT  --  117*  --   --   --   --   --   --   LABPROT  --  15.2  --   --   --   --   --   --   INR  --  1.18  --   --   --   --   --   --   HEPARINUNFRC  --   --   < >  --   --  0.24* 0.25* 0.52  CREATININE 1.15  --   --   --  1.20  --   --   --   TROPONINI 0.04* 2.65*  --  5.19* 7.83*  --   --   --   < > = values in this interval not displayed.    Assessment/Plan:  64yo male therapeutic on heparin after rate adjustment. Will continue gtt at current rate and confirm stable with additional level.   Wynona Neat, PharmD, BCPS  12/09/2014,2:46 AM

## 2014-12-10 ENCOUNTER — Inpatient Hospital Stay (HOSPITAL_COMMUNITY): Payer: Medicare Other

## 2014-12-10 ENCOUNTER — Encounter (HOSPITAL_COMMUNITY): Payer: Self-pay | Admitting: Cardiovascular Disease

## 2014-12-10 DIAGNOSIS — I10 Essential (primary) hypertension: Secondary | ICD-10-CM

## 2014-12-10 DIAGNOSIS — I25110 Atherosclerotic heart disease of native coronary artery with unstable angina pectoris: Secondary | ICD-10-CM

## 2014-12-10 DIAGNOSIS — I714 Abdominal aortic aneurysm, without rupture: Secondary | ICD-10-CM

## 2014-12-10 LAB — BASIC METABOLIC PANEL
Anion gap: 9 (ref 5–15)
BUN: 8 mg/dL (ref 6–20)
CO2: 22 mmol/L (ref 22–32)
CREATININE: 1 mg/dL (ref 0.61–1.24)
Calcium: 8.5 mg/dL — ABNORMAL LOW (ref 8.9–10.3)
Chloride: 99 mmol/L — ABNORMAL LOW (ref 101–111)
Glucose, Bld: 100 mg/dL — ABNORMAL HIGH (ref 65–99)
Potassium: 3.8 mmol/L (ref 3.5–5.1)
Sodium: 130 mmol/L — ABNORMAL LOW (ref 135–145)

## 2014-12-10 LAB — PULMONARY FUNCTION TEST
FEF 25-75 PRE: 1.29 L/s
FEF 25-75 Post: 1.44 L/sec
FEF2575-%Change-Post: 11 %
FEF2575-%PRED-POST: 52 %
FEF2575-%PRED-PRE: 46 %
FEV1-%Change-Post: 0 %
FEV1-%Pred-Post: 75 %
FEV1-%Pred-Pre: 76 %
FEV1-POST: 2.65 L
FEV1-PRE: 2.68 L
FEV1FVC-%Change-Post: 3 %
FEV1FVC-%PRED-PRE: 93 %
FEV6-%Change-Post: -1 %
FEV6-%Pred-Post: 80 %
FEV6-%Pred-Pre: 82 %
FEV6-POST: 3.63 L
FEV6-PRE: 3.68 L
FEV6FVC-%Change-Post: 3 %
FEV6FVC-%Pred-Post: 102 %
FEV6FVC-%Pred-Pre: 99 %
FVC-%CHANGE-POST: -4 %
FVC-%PRED-PRE: 82 %
FVC-%Pred-Post: 78 %
FVC-Post: 3.72 L
FVC-Pre: 3.89 L
POST FEV6/FVC RATIO: 98 %
PRE FEV6/FVC RATIO: 95 %
Post FEV1/FVC ratio: 71 %
Pre FEV1/FVC ratio: 69 %

## 2014-12-10 LAB — CBC
HCT: 31.5 % — ABNORMAL LOW (ref 39.0–52.0)
HEMOGLOBIN: 11.1 g/dL — AB (ref 13.0–17.0)
MCH: 35.5 pg — ABNORMAL HIGH (ref 26.0–34.0)
MCHC: 35.2 g/dL (ref 30.0–36.0)
MCV: 100.6 fL — AB (ref 78.0–100.0)
Platelets: 188 10*3/uL (ref 150–400)
RBC: 3.13 MIL/uL — ABNORMAL LOW (ref 4.22–5.81)
RDW: 12.9 % (ref 11.5–15.5)
WBC: 8.8 10*3/uL (ref 4.0–10.5)

## 2014-12-10 LAB — HEPARIN LEVEL (UNFRACTIONATED)
Heparin Unfractionated: 0.34 IU/mL (ref 0.30–0.70)
Heparin Unfractionated: 0.52 IU/mL (ref 0.30–0.70)

## 2014-12-10 MED ORDER — ALBUTEROL SULFATE (2.5 MG/3ML) 0.083% IN NEBU
2.5000 mg | INHALATION_SOLUTION | Freq: Once | RESPIRATORY_TRACT | Status: AC
  Administered 2014-12-10: 2.5 mg via RESPIRATORY_TRACT

## 2014-12-10 MED ORDER — HYDRALAZINE HCL 50 MG PO TABS
50.0000 mg | ORAL_TABLET | Freq: Two times a day (BID) | ORAL | Status: DC
  Administered 2014-12-10 – 2014-12-11 (×4): 50 mg via ORAL
  Filled 2014-12-10 (×6): qty 1

## 2014-12-10 MED FILL — Verapamil HCl IV Soln 2.5 MG/ML: INTRAVENOUS | Qty: 2 | Status: AC

## 2014-12-10 NOTE — Progress Notes (Signed)
ANTICOAGULATION CONSULT NOTE - Follow Up Consult  Pharmacy Consult for heparin Indication: CAD awaiting CVTS consult   Labs:  Recent Labs  12/07/14 1028 12/07/14 1800  12/07/14 2317 12/08/14 0443  12/08/14 1831 12/09/14 0135 12/10/14 0237  HGB 12.3*  --   --   --  11.6*  --   --  11.0* 11.1*  HCT 35.0*  --   --   --  32.1*  --   --  31.4* 31.5*  PLT 216  --   --   --  210  --   --  195 188  APTT  --  117*  --   --   --   --   --   --   --   LABPROT  --  15.2  --   --   --   --   --   --   --   INR  --  1.18  --   --   --   --   --   --   --   HEPARINUNFRC  --   --   < >  --   --   < > 0.25* 0.52 0.34  CREATININE 1.15  --   --   --  1.20  --   --   --   --   TROPONINI 0.04* 2.65*  --  5.19* 7.83*  --   --   --   --   < > = values in this interval not displayed.   Assessment/Plan:  64yo male therapeutic on heparin after resumed post-cath. Will continue gtt at current rate and confirm stable with additional level.   Wynona Neat, PharmD, BCPS  12/10/2014,4:03 AM

## 2014-12-10 NOTE — Progress Notes (Signed)
CARDIAC REHAB PHASE I   Pt sitting up in chair, has had intermittent chest pain, plan to hold ambulation for now. Pre-op education completed. Reviewed IS, sternal precautions, activity progression, cardiac surgery book, OHS guidelines, and need for 24 hr supervision at discharge. Pt verbalized understanding. Pt states he lives at home with his wife and has trouble ambulating long distances due to "vascular issues." Pt in recliner, call bell within reach. Will follow-up post-op.  [id number]  [name], RN, BSN 12/10/2014 3:25 PM

## 2014-12-10 NOTE — Progress Notes (Signed)
Admitted for NSTEMI - MV CAD on cath.  Subjective:  Still having intermittent CP - NTG gtt increased; waxes & wanes.  Objective:  Vital Signs in the last 24 hours: Temp:  [98.1 F (36.7 C)-98.5 F (36.9 C)] 98.1 F (36.7 C) (07/19 0400) Pulse Rate:  [0-86] 67 (07/19 0700) Resp:  [8-25] 13 (07/19 0700) BP: (102-171)/(51-110) 160/67 mmHg (07/19 0747) SpO2:  [0 %-99 %] 94 % (07/19 0700) Weight:  [88.6 kg (195 lb 5.2 oz)] 88.6 kg (195 lb 5.2 oz) (07/18 1506)  Intake/Output from previous day: 07/18 0701 - 07/19 0700 In: 1482.3 [I.V.:1482.3] Out: 600 [Urine:600] Intake/Output from this shift:    Physical Exam: General appearance: alert, cooperative, appears stated age and no distress Neck: no adenopathy, no JVD, supple, symmetrical, trachea midline, thyroid not enlarged, symmetric, no tenderness/mass/nodules and R CEA scar, L bruit Lungs: clear to auscultation bilaterally and normal percussion bilaterally Heart: RRR, nl S1&2, ~soft S4, no M/R.  non-displaced PMI Abdomen: soft, non-tender; bowel sounds normal; no masses,  no organomegaly Extremities: extremities normal, atraumatic, no cyanosis or edema Pulses: 2+ and symmetric Neurologic: Grossly normal  Lab Results:  Recent Labs  12/09/14 0135 12/10/14 0237  WBC 9.8 8.8  HGB 11.0* 11.1*  PLT 195 188    Recent Labs  12/07/14 1028 12/08/14 0443  NA 131* 129*  K 4.4 4.0  CL 98* 97*  CO2 22 22  GLUCOSE 104* 100*  BUN 14 13  CREATININE 1.15 1.20    Recent Labs  12/07/14 2317 12/08/14 0443  TROPONINI 5.19* 7.83*   Hepatic Function Panel No results for input(s): PROT, ALBUMIN, AST, ALT, ALKPHOS, BILITOT, BILIDIR, IBILI in the last 72 hours.  Recent Labs  12/08/14 0443  CHOL 107   No results for input(s): PROTIME in the last 72 hours.  Imaging:  Cardiac Studies: FINAL CONCLUSIONS:  Chronic occlusion of the RCA with left-to-right collaterals  Severe left-main equivalent disease involving the  trifurcation point of the left main, ostial LAD, LCx, and ramus  99% stenosis of the ramus intermedius (suspected culprit lesion)  Moderate segmental LV systolic dysfunction with inferior wall akinesis, LVEF estimated at 45%  Severe calcified coronary arteries, aortic arch, and innominate artery  RECOMMENDATIONS: Surgical evaluation for CABG. See notes of Dr Sammuel Hines in Care Everywhere related to complex AAA. Pt with extensive vascular disease. Will tx to CCU, resume IV heparin, start IV NTG, and stop Plavix.  Assessment/Plan:  Principal Problem:   NSTEMI (non-ST elevated myocardial infarction) Active Problems:   Atherosclerosis of native coronary artery with unstable angina pectoris   Atherosclerosis of native arteries of extremity with intermittent claudication   Essential hypertension, benign   Hyperlipidemia with target LDL less than 70   PAD (peripheral artery disease)   Carotid stenosis, bilateral   AAA (abdominal aortic aneurysm) without rupture   Intermittent claudication   Hyponatremia   MV CAD with CT Sgx c/s for CABG - still requiring NTG gtt for CP; on Heparin gtt (appreciate pharmacist team assistance) -- was on Plavix (given yesterday) - check P2 Y12 Inh assay in AM (hopefully can expedite surgery) -- awaiting CT Sgx consult  BP in 160s - on high dose BB (37.5 bid) & ACE-I (20 mg BID), increase Hydralazine to 50 mg BID; would use diuretic next (allow time for post-cath renal recovery) On Crestor  Has complex AAA  & PAD - to be re-assessed post-OP   HypoNa 2d ago - recheck labs.  Holding off on diuretics  ON H2 blocker for GI prophylaxis.   LOS: 3 days    [name] 12/10/2014, 8:16 AM

## 2014-12-10 NOTE — Progress Notes (Signed)
Pre-op Cardiac Surgery  Carotid Findings:  Bilateral:  1-39% ICA stenosis.  Vertebral artery flow is antegrade.    Landry Mellow, RDMS, RVT 12/10/2014   Limbs Pending.

## 2014-12-10 NOTE — Consult Note (Signed)
[address]       [place] [address]             [phone]      Cardiothoracic Surgery Consultation   Reason for Consult: Severe multi-vessel coronary disease s/p NSTEMI Referring Physician:  Dr. [name]  [name] is an 64 y.o. male.  HPI:   The patient is a 64 year old vasculopath with hypertension, hyperlipidemia and coronary disease with known prior RCA occlusion that has been managed medically. He presented on 12/07/2014 with a several month history of exertional angina with a one day history of stuttering angina that did not respond to NTG and he ruled in for a NSTEMI with a troponin of 2.65 increasing to 7.83. Cath showed severe LM equivalent disease involving the trifurcation into the LAD, Ramus, and LCX with a 99% proximal Ramus lesion suspected to be the culprit. There is chronic occlusion of the RCA with left to right collaterals. The LVEF is 45%. He has severe calcification of the aortic arch, descending thoracic and abdomino-iliac arteries with a 5 cm x 5.7 cm complex AAA by CT in 07/2013 followed by Dr. Sammuel Hines at [facility]. He was supposed to have a follow up scan a few months ago but developed a staph infection in the left foot that took several months to clear up. He was scheduled to return to Dr. Sammuel Hines for reevaluation later this week. He has had intermittent CP overnight and is now on heparin and NTG.  Past Medical History  Diagnosis Date  . Hyperlipidemia   . Coronary artery disease   . AAA (abdominal aortic aneurysm)   . PAD (peripheral artery disease)   . COPD (chronic obstructive pulmonary disease)   . Raynaud's disease /phenomenon   . Lumbar degenerative disc disease   . Stroke 10/15/2005  . PONV (postoperative nausea and vomiting)   . Hypertension     takes meds daily  . Anxiety   . GERD (gastroesophageal reflux disease)   . History of blood transfusion   . Hyponatremia     Past Surgical History  Procedure Laterality Date    . Iliac artery stent  10/15/2005     Left common and external   . Cardiac catheterization  2006  . Carotid endarterectomy      bilateral  . Carotid endarterectomy  04/13/2005    Right and 06/03/2005 Left  . Abdominal angiogram  03/30/2012  . Hernia repair    . Abdominal aortagram N/A 03/30/2012    Procedure: ABDOMINAL Maxcine Ham;  Surgeon: Conrad Wetonka, MD;  Location: [facility] CATH LAB;  Service: Cardiovascular;  Laterality: N/A;  . Cardiac catheterization N/A 12/09/2014    Procedure: Left Heart Cath and Coronary Angiography;  Surgeon: [name], MD;  Location: [facility] CV LAB;  Service: Cardiovascular;  Laterality: N/A;    Family History  Problem Relation Age of Onset  . Coronary artery disease Father 26    HTN, Hyperlipidemia died  . Heart disease Father     Heart Disease before age 55  . Hypertension Father   . Hyperlipidemia Father   . Peripheral vascular disease Father   . Other Mother 5     died rheumatoid arthritis  . Dementia Mother   . Heart disease Brother   . Hypertension Brother   . Hyperlipidemia Brother     Social History:  reports that he quit smoking about 9 years ago. His smoking use included Cigarettes. He has never  used smokeless tobacco. He reports that he drinks about 14.4 oz of alcohol per week. He reports that he does not use illicit drugs.  Allergies:  Allergies  Allergen Reactions  . Norvasc [Amlodipine Besylate] Swelling       . Pletal [Cilostazol] Diarrhea    Medications:  I have reviewed the patient's current medications. Prior to Admission:  Prescriptions prior to admission  Medication Sig Dispense Refill Last Dose  . aspirin 81 MG tablet Take 81 mg by mouth daily.     12/06/2014 at Unknown time  . carvedilol (COREG) 25 MG tablet take 1 and 1/2 tablets by mouth twice a day (Patient taking differently: Take 37.37m by mouth twice daily) 90 tablet 6 12/07/2014 at 0900  . clopidogrel (PLAVIX) 75 MG tablet take 1 tablet by mouth once daily  (Patient taking differently: Take 781mby mouth once daily) 90 tablet 0 12/07/2014 at Unknown time  . CRESTOR 20 MG tablet take 1 tablet by mouth once daily at bedtime (Patient taking differently: Take 2091my mouth at bedtime) 30 tablet 5 12/06/2014 at Unknown time  . cyclobenzaprine (FLEXERIL) 5 MG tablet Take 5 mg by mouth 3 (three) times daily as needed for muscle spasms.   0 Unknown at Unknown  . hydrALAZINE (APRESOLINE) 25 MG tablet Take 1 tablet (25 mg total) by mouth 2 (two) times daily. 60 tablet 6   . isosorbide mononitrate (IMDUR) 120 MG 24 hr tablet Take 1 tablet (120 mg total) by mouth daily. 90 tablet 6 12/07/2014 at Unknown time  . lisinopril (PRINIVIL,ZESTRIL) 20 MG tablet take 1 tablet by mouth twice a day (Patient taking differently: Take 54m26m mouth twice daily) 60 tablet 11 12/07/2014 at Unknown time  . nitroGLYCERIN (NITROSTAT) 0.4 MG SL tablet Place 1 tablet (0.4 mg total) under the tongue every 5 (five) minutes as needed. (Patient taking differently: Place 0.4 mg under the tongue every 5 (five) minutes as needed for chest pain. ) 25 tablet 6 12/07/2014 at Unknown time  . ranitidine (ZANTAC) 300 MG tablet take 1 tablet by mouth at bedtime (Patient taking differently: Take 300mg62mmouth at bedtime) 30 tablet 6 12/06/2014 at Unknown time  . Thiamine HCl (VITAMIN B-1) 100 MG tablet Take 100 mg by mouth daily.     12/07/2014 at Unknown time  . triamcinolone cream (KENALOG) 0.5 % Apply 1 application topically as needed (for itching).   12/06/2014 at Unknown time  . furosemide (LASIX) 20 MG tablet Take 1 tablet (20 mg total) by mouth daily as needed. (Patient not taking: Reported on 12/07/2014) 30 tablet 6 Taking   Scheduled: . aspirin  81 mg Oral Daily  . carvedilol  37.5 mg Oral BID WC  . famotidine  20 mg Oral QHS  . hydrALAZINE  50 mg Oral BID  . lisinopril  20 mg Oral BID  . rosuvastatin  20 mg Oral QHS  . sodium chloride  3 mL Intravenous Q12H   Continuous: . sodium chloride 20  mL/hr at 12/10/14 0600  . heparin 1,450 Units/hr (12/10/14 0923)  . nitroGLYCERIN 20 mcg/min (12/10/14 0600)   PRN:sDTO:IZTIWPride, acetaminophen, cyclobenzaprine, hydrALAZINE, nitroGLYCERIN, ondansetron (ZOFRAN) IV, sodium chloride Anti-infectives    None      Results for orders placed or performed during the hospital encounter of 12/07/14 (from the past 48 hour(s))  Heparin level (unfractionated)     Status: Abnormal   Collection Time: 12/08/14  6:31 PM  Result Value Ref Range   Heparin Unfractionated 0.25 (  L) 0.30 - 0.70 IU/mL    Comment:        IF HEPARIN RESULTS ARE BELOW EXPECTED VALUES, AND PATIENT DOSAGE HAS BEEN CONFIRMED, SUGGEST FOLLOW UP TESTING OF ANTITHROMBIN III LEVELS.   CBC     Status: Abnormal   Collection Time: 12/09/14  1:35 AM  Result Value Ref Range   WBC 9.8 4.0 - 10.5 K/uL   RBC 3.16 (L) 4.22 - 5.81 MIL/uL   Hemoglobin 11.0 (L) 13.0 - 17.0 g/dL   HCT 31.4 (L) 39.0 - 52.0 %   MCV 99.4 78.0 - 100.0 fL   MCH 34.8 (H) 26.0 - 34.0 pg   MCHC 35.0 30.0 - 36.0 g/dL   RDW 12.6 11.5 - 15.5 %   Platelets 195 150 - 400 K/uL  Heparin level (unfractionated)     Status: None   Collection Time: 12/09/14  1:35 AM  Result Value Ref Range   Heparin Unfractionated 0.52 0.30 - 0.70 IU/mL    Comment:        IF HEPARIN RESULTS ARE BELOW EXPECTED VALUES, AND PATIENT DOSAGE HAS BEEN CONFIRMED, SUGGEST FOLLOW UP TESTING OF ANTITHROMBIN III LEVELS.   MRSA PCR Screening     Status: None   Collection Time: 12/09/14  3:07 PM  Result Value Ref Range   MRSA by PCR NEGATIVE NEGATIVE    Comment:        The GeneXpert MRSA Assay (FDA approved for NASAL specimens only), is one component of a comprehensive MRSA colonization surveillance program. It is not intended to diagnose MRSA infection nor to guide or monitor treatment for MRSA infections.   CBC     Status: Abnormal   Collection Time: 12/10/14  2:37 AM  Result Value Ref Range   WBC 8.8 4.0 - 10.5 K/uL   RBC 3.13  (L) 4.22 - 5.81 MIL/uL   Hemoglobin 11.1 (L) 13.0 - 17.0 g/dL   HCT 31.5 (L) 39.0 - 52.0 %   MCV 100.6 (H) 78.0 - 100.0 fL   MCH 35.5 (H) 26.0 - 34.0 pg   MCHC 35.2 30.0 - 36.0 g/dL   RDW 12.9 11.5 - 15.5 %   Platelets 188 150 - 400 K/uL  Heparin level (unfractionated)     Status: None   Collection Time: 12/10/14  2:37 AM  Result Value Ref Range   Heparin Unfractionated 0.34 0.30 - 0.70 IU/mL    Comment:        IF HEPARIN RESULTS ARE BELOW EXPECTED VALUES, AND PATIENT DOSAGE HAS BEEN CONFIRMED, SUGGEST FOLLOW UP TESTING OF ANTITHROMBIN III LEVELS.   Basic metabolic panel     Status: Abnormal   Collection Time: 12/10/14 10:17 AM  Result Value Ref Range   Sodium 130 (L) 135 - 145 mmol/L   Potassium 3.8 3.5 - 5.1 mmol/L   Chloride 99 (L) 101 - 111 mmol/L   CO2 22 22 - 32 mmol/L   Glucose, Bld 100 (H) 65 - 99 mg/dL   BUN 8 6 - 20 mg/dL   Creatinine, Ser 1.00 0.61 - 1.24 mg/dL   Calcium 8.5 (L) 8.9 - 10.3 mg/dL   GFR calc non Af Amer >60 >60 mL/min   GFR calc Af Amer >60 >60 mL/min    Comment: (NOTE) The eGFR has been calculated using the CKD EPI equation. This calculation has not been validated in all clinical situations. eGFR's persistently <60 mL/min signify possible Chronic Kidney Disease.    Anion gap 9 5 - 15  Heparin level (  unfractionated)     Status: None   Collection Time: 12/10/14 10:50 AM  Result Value Ref Range   Heparin Unfractionated 0.52 0.30 - 0.70 IU/mL    Comment:        IF HEPARIN RESULTS ARE BELOW EXPECTED VALUES, AND PATIENT DOSAGE HAS BEEN CONFIRMED, SUGGEST FOLLOW UP TESTING OF ANTITHROMBIN III LEVELS.     No results found.  Review of Systems  Constitutional: Positive for malaise/fatigue. Negative for fever, chills and weight loss.  HENT: Negative.   Eyes: Negative.   Respiratory: Negative for shortness of breath.   Cardiovascular: Positive for chest pain. Negative for palpitations, orthopnea, leg swelling and PND.  Gastrointestinal:  Negative.   Genitourinary: Negative.   Musculoskeletal: Negative.   Skin: Negative.   Neurological: Negative.   Endo/Heme/Allergies: Negative.   Psychiatric/Behavioral: The patient is nervous/anxious.    Blood pressure 125/46, pulse 67, temperature 98.6 F (37 C), temperature source Oral, resp. rate 10, height 5' 11"  (1.803 m), weight 88.6 kg (195 lb 5.2 oz), SpO2 97 %. Physical Exam  Constitutional: He is oriented to person, place, and time.  Obese gentleman in no distress  HENT:  Head: Normocephalic and atraumatic.  Mouth/Throat: Oropharynx is clear and moist.  Eyes: EOM are normal. Pupils are equal, round, and reactive to light.  Neck: Normal range of motion. Neck supple. No JVD present. No thyromegaly present.  Cardiovascular: Normal rate, regular rhythm and normal heart sounds.   No murmur heard. Pedal pulses not palpable  Respiratory: Effort normal and breath sounds normal. No respiratory distress. He has no rales.  GI: Soft. Bowel sounds are normal. He exhibits no distension. There is no tenderness.  Prominent aortic pulsation  Musculoskeletal: Normal range of motion. He exhibits no edema or tenderness.  Lymphadenopathy:    He has no cervical adenopathy.  Neurological: He is alert and oriented to person, place, and time. He has normal strength. No cranial nerve deficit or sensory deficit.  Skin: Skin is warm and dry.  Psychiatric: He has a normal mood and affect.    Cardiac Cath:  Conclusion    1. There is mild to moderate left ventricular systolic dysfunction. 2. Mid RCA lesion, 100% stenosed. 3. Prox LAD lesion, 90% stenosed. 4. LM lesion, 75% stenosed. 5. Ost Ramus to Ramus lesion, 99% stenosed. 6. 1st Diag lesion, 90% stenosed. 7. Prox Cx lesion, 70% stenosed.  FINAL CONCLUSIONS:  Chronic occlusion of the RCA with left-to-right collaterals  Severe left-main equivalent disease involving the trifurcation point of the left main, ostial LAD, LCx, and  ramus  99% stenosis of the ramus intermedius (suspected culprit lesion)  Moderate segmental LV systolic dysfunction with inferior wall akinesis, LVEF estimated at 45%  Severe calcified coronary arteries, aortic arch, and innominate artery  RECOMMENDATIONS: Surgical evaluation for CABG. See notes of Dr Sammuel Hines in Care Everywhere related to complex AAA. Pt with extensive vascular disease. Will tx to CCU, resume IV heparin, start IV NTG, and stop Plavix.     Coronary Findings    Dominance: Right   Left Main   . LM lesion, 75% stenosed. There is a complex lesion that essentially involves the trifurcation point of the distal left mainstem. The proximal LAD and ramus intermedius branch have critical ostial disease and the proximal circumflex has moderately severe stenosis. All lesions have heavy calcification.     Left Anterior Descending   . Prox LAD lesion, 90% stenosed. calcified eccentric .   Marland Kitchen First Engineer, production   . 1st Diag  lesion, 90% stenosed. calcified .     Ramus Intermedius   . Ost Ramus to Ramus lesion, 99% stenosed. calcified eccentric . There is tight ostial stenosis, followed by aneurysmal dilatation, then critical 99% stenosis into a large intermediate branch.     Left Circumflex   . Prox Cx lesion, 70% stenosed. calcified .     Right Coronary Artery   . Mid RCA lesion, 100% stenosed. calcified diffuse .   Marland Kitchen Inferior Septal   Inf Sept filled by collaterals from 1st Sept.   . Second Right Posterolateral   2nd RPLB filled by collaterals from 1st Sept.      Wall Motion                 Left Heart    Left Ventricle The left ventricular size is normal. There is mild to moderate left ventricular systolic dysfunction. There are wall motion abnormalities in the left ventricle. There are segmental wall motion abnormalities in the left ventricle. The estimated LVEF is 45% secondary to basal and mid-inferior wall akinesis    Coronary Diagrams     Diagnostic Diagram            Implants    Name ID Temporary Type Supply   No information to display    Hemo Data       Most Recent Value   AO Systolic Pressure  062 mmHg   AO Diastolic Pressure  59 mmHg   AO Mean  85 mmHg   LV Systolic Pressure  694 mmHg   LV Diastolic Pressure  11 mmHg   LV EDP  25 mmHg   Arterial Occlusion Pressure Extended Systolic Pressure  854 mmHg   Arterial Occlusion Pressure Extended Diastolic Pressure  70 mmHg   Arterial Occlusion Pressure Extended Mean Pressure  103 mmHg   Left Ventricular Apex Extended Systolic Pressure  627 mmHg   Left Ventricular Apex Extended Diastolic Pressure  17 mmHg   Left Ventricular Apex Extended EDP Pressure  31 mmHg                                     Conclusion    8. There is mild to moderate left ventricular systolic dysfunction. 9. Mid RCA lesion, 100% stenosed. 10. Prox LAD lesion, 90% stenosed. 11. LM lesion, 75% stenosed. 12. Ost Ramus to Ramus lesion, 99% stenosed. 13. 1st Diag lesion, 90% stenosed. 14. Prox Cx lesion, 70% stenosed.  FINAL CONCLUSIONS:  Chronic occlusion of the RCA with left-to-right collaterals  Severe left-main equivalent disease involving the trifurcation point of the left main, ostial LAD, LCx, and ramus  99% stenosis of the ramus intermedius (suspected culprit lesion)  Moderate segmental LV systolic dysfunction with inferior wall akinesis, LVEF estimated at 45%  Severe calcified coronary arteries, aortic arch, and innominate artery  RECOMMENDATIONS: Surgical evaluation for CABG. See notes of Dr Sammuel Hines in Care Everywhere related to complex AAA. Pt with extensive vascular disease. Will tx to CCU, resume IV heparin, start IV NTG, and stop Plavix.     Coronary Findings    Dominance: Right   Left Main   . LM lesion, 75% stenosed. There is a complex lesion that essentially involves the trifurcation point of the distal left mainstem. The  proximal LAD and ramus intermedius branch have critical ostial disease and the proximal circumflex has moderately severe stenosis. All lesions have heavy calcification.  Left Anterior Descending   . Prox LAD lesion, 90% stenosed. calcified eccentric .   Marland Kitchen First Engineer, production   . 1st Diag lesion, 90% stenosed. calcified .     Ramus Intermedius   . Ost Ramus to Ramus lesion, 99% stenosed. calcified eccentric . There is tight ostial stenosis, followed by aneurysmal dilatation, then critical 99% stenosis into a large intermediate branch.     Left Circumflex   . Prox Cx lesion, 70% stenosed. calcified .     Right Coronary Artery   . Mid RCA lesion, 100% stenosed. calcified diffuse .   Marland Kitchen Inferior Septal   Inf Sept filled by collaterals from 1st Sept.   . Second Right Posterolateral   2nd RPLB filled by collaterals from 1st Sept.      Wall Motion                 Left Heart    Left Ventricle The left ventricular size is normal. There is mild to moderate left ventricular systolic dysfunction. There are wall motion abnormalities in the left ventricle. There are segmental wall motion abnormalities in the left ventricle. The estimated LVEF is 45% secondary to basal and mid-inferior wall akinesis    Coronary Diagrams    Diagnostic Diagram            Implants    Name ID Temporary Type Supply   No information to display    Hemo Data       Most Recent Value   AO Systolic Pressure  638 mmHg   AO Diastolic Pressure  59 mmHg   AO Mean  85 mmHg   LV Systolic Pressure  937 mmHg   LV Diastolic Pressure  11 mmHg   LV EDP  25 mmHg   Arterial Occlusion Pressure Extended Systolic Pressure  342 mmHg   Arterial Occlusion Pressure Extended Diastolic Pressure  70 mmHg   Arterial Occlusion Pressure Extended Mean Pressure  103 mmHg   Left Ventricular Apex Extended Systolic Pressure  876 mmHg   Left Ventricular Apex Extended Diastolic Pressure  17 mmHg   Left  Ventricular Apex Extended EDP Pressure  31 mmHg      Conclusion    15. There is mild to moderate left ventricular systolic dysfunction. 16. Mid RCA lesion, 100% stenosed. 17. Prox LAD lesion, 90% stenosed. 18. LM lesion, 75% stenosed. 19. Ost Ramus to Ramus lesion, 99% stenosed. 20. 1st Diag lesion, 90% stenosed. 21. Prox Cx lesion, 70% stenosed.  FINAL CONCLUSIONS:  Chronic occlusion of the RCA with left-to-right collaterals  Severe left-main equivalent disease involving the trifurcation point of the left main, ostial LAD, LCx, and ramus  99% stenosis of the ramus intermedius (suspected culprit lesion)  Moderate segmental LV systolic dysfunction with inferior wall akinesis, LVEF estimated at 45%  Severe calcified coronary arteries, aortic arch, and innominate artery  RECOMMENDATIONS: Surgical evaluation for CABG. See notes of Dr Sammuel Hines in Care Everywhere related to complex AAA. Pt with extensive vascular disease. Will tx to CCU, resume IV heparin, start IV NTG, and stop Plavix.     Coronary Findings    Dominance: Right   Left Main   . LM lesion, 75% stenosed. There is a complex lesion that essentially involves the trifurcation point of the distal left mainstem. The proximal LAD and ramus intermedius branch have critical ostial disease and the proximal circumflex has moderately severe stenosis. All lesions have heavy calcification.     Left Anterior Descending   . Prox  LAD lesion, 90% stenosed. calcified eccentric .   Marland Kitchen First Engineer, production   . 1st Diag lesion, 90% stenosed. calcified .     Ramus Intermedius   . Ost Ramus to Ramus lesion, 99% stenosed. calcified eccentric . There is tight ostial stenosis, followed by aneurysmal dilatation, then critical 99% stenosis into a large intermediate branch.     Left Circumflex   . Prox Cx lesion, 70% stenosed. calcified .     Right Coronary Artery   . Mid RCA lesion, 100% stenosed. calcified diffuse .   Marland Kitchen Inferior  Septal   Inf Sept filled by collaterals from 1st Sept.   . Second Right Posterolateral   2nd RPLB filled by collaterals from 1st Sept.      Wall Motion                 Left Heart    Left Ventricle The left ventricular size is normal. There is mild to moderate left ventricular systolic dysfunction. There are wall motion abnormalities in the left ventricle. There are segmental wall motion abnormalities in the left ventricle. The estimated LVEF is 45% secondary to basal and mid-inferior wall akinesis    Coronary Diagrams    Diagnostic Diagram            Implants    Name ID Temporary Type Supply   No information to display    Hemo Data       Most Recent Value   AO Systolic Pressure  195 mmHg   AO Diastolic Pressure  59 mmHg   AO Mean  85 mmHg   LV Systolic Pressure  093 mmHg   LV Diastolic Pressure  11 mmHg   LV EDP  25 mmHg   Arterial Occlusion Pressure Extended Systolic Pressure  267 mmHg   Arterial Occlusion Pressure Extended Diastolic Pressure  70 mmHg   Arterial Occlusion Pressure Extended Mean Pressure  103 mmHg   Left Ventricular Apex Extended Systolic Pressure  124 mmHg   Left Ventricular Apex Extended Diastolic Pressure  17 mmHg   Left Ventricular Apex Extended EDP Pressure  31 mmHg    Order-Level Documents - 12/07/14:      Scan on 12/10/2014 1:54 PM by Deneise Lever, MDScan on 12/10/2014 1:54 PM by Deneise Lever, MD    Encounter-Level Documents - 12/07/14:      Scan on 12/09/2014 2:29 PM by Provider Default, MDScan on 12/09/2014 2:29 PM by Provider Default, MD     Scan on 12/10/2014 9:06 AM by Provider Default, MDScan on 12/10/2014 9:06 AM by Provider Default, MD     Electronic signature on 12/07/2014 12:18 PM     Electronic signature on 12/07/2014 12:18 PM    Signed    Electronically signed by [name], MD on 12/09/14 at 1551 EDT                                                Assessment/Plan:   NSTEMI (non-ST elevated myocardial infarction)  Atherosclerosis of native coronary artery with unstable angina pectoris  Atherosclerosis of native arteries of extremity with intermittent claudication  Essential hypertension, benign  Hyperlipidemia   PAD (peripheral artery disease)  Carotid stenosis, s/p bilateral CEA  5 cm x 5.7 cm AAA (abdominal aortic aneurysm) without rupture  Intermittent claudication  Hyponatremia  I have personally reviewed his cardiac  cath films and his records from [facility] through [facility]. He has severe multivessel coronary disease with unstable angina on heparin and NTG drips following NSTEMI. I agree that CABG is the best treatment for him. He has diffuse vascular disease as noted above but the ascending aorta appears to be adequate for cannulation with no significant calcification. He has been on Plavix which has been stopped as of Monday. I will check a P2Y12 and plan to do Friday am. I discussed the operative procedure with the patient and family including alternatives, benefits and risks; including but not limited to bleeding, blood transfusion, infection, stroke, myocardial infarction, graft failure, heart block requiring a permanent pacemaker, organ dysfunction, and death.  [name] understands and agrees to proceed.  Gaye Pollack 12/10/2014

## 2014-12-10 NOTE — Progress Notes (Signed)
ANTICOAGULATION CONSULT NOTE - Follow Up Consult  Pharmacy Consult for heparin Indication: CAD awaiting CVTS consult   Labs:  Recent Labs  12/07/14 1800  12/07/14 2317  12/08/14 0443  12/09/14 0135 12/10/14 0237 12/10/14 1017 12/10/14 1050  HGB  --   --   --   < > 11.6*  --  11.0* 11.1*  --   --   HCT  --   --   --   --  32.1*  --  31.4* 31.5*  --   --   PLT  --   --   --   --  210  --  195 188  --   --   APTT 117*  --   --   --   --   --   --   --   --   --   LABPROT 15.2  --   --   --   --   --   --   --   --   --   INR 1.18  --   --   --   --   --   --   --   --   --   HEPARINUNFRC  --   < >  --   --   --   < > 0.52 0.34  --  0.52  CREATININE  --   --   --   --  1.20  --   --   --  1.00  --   TROPONINI 2.65*  --  5.19*  --  7.83*  --   --   --   --   --   < > = values in this interval not displayed.   Assessment/Plan:  64 yo man admitted 12/07/2014 for worsening chest pain over few days.   PMH: HLD, CAD, AAA, PAD, COPD, Raynauds, LDDD, CVA, GERD  Pharmacy consulted to dose heparin.  [id number] for CP/concern for UA  Post cath, no intervention. Pt with extensive vascular disease. Heparin restarted 2h after TR band removal. TR band off at 1800. Restarted heparin at 1450 units/hr, which was therapeutic rate prior to cath  H/H 11.1/31.5 stable, plts WNL, troponin up to 7.83, no AC PTA  HL= 0.52, therapeutic (goal 0.3-0.7) on 1450 units/hr  Goal of Therapy:  Heparin level 0.3-0.7 units/ml Monitor platelets by anticoagulation protocol: Yes  Plan:  - Continue heparin at 1450units/hr  - Daily HL and CBC - Follow CVTS consult & CABG plans  Heloise Ochoa, PharmD  12/10/2014,1:57 PM  Agree with above  Bonnita Nasuti Pharm.D. CPP, BCPS Clinical Pharmacist [phone] 12/10/2014 2:41 PM

## 2014-12-11 ENCOUNTER — Inpatient Hospital Stay (HOSPITAL_COMMUNITY): Payer: Medicare Other

## 2014-12-11 LAB — BASIC METABOLIC PANEL
ANION GAP: 12 (ref 5–15)
BUN: 9 mg/dL (ref 6–20)
CALCIUM: 8.6 mg/dL — AB (ref 8.9–10.3)
CHLORIDE: 99 mmol/L — AB (ref 101–111)
CO2: 20 mmol/L — AB (ref 22–32)
Creatinine, Ser: 1.02 mg/dL (ref 0.61–1.24)
GFR calc Af Amer: >60 mL/min (ref >60)
Glucose, Bld: 92 mg/dL (ref 65–99)
Potassium: 3.6 mmol/L (ref 3.5–5.1)
Sodium: 131 mmol/L — ABNORMAL LOW (ref 135–145)

## 2014-12-11 LAB — HEPARIN LEVEL (UNFRACTIONATED): HEPARIN UNFRACTIONATED: 0.51 [IU]/mL (ref 0.30–0.70)

## 2014-12-11 LAB — CBC
HCT: 30.1 % — ABNORMAL LOW (ref 39.0–52.0)
HEMOGLOBIN: 10.4 g/dL — AB (ref 13.0–17.0)
MCH: 34.9 pg — ABNORMAL HIGH (ref 26.0–34.0)
MCHC: 34.6 g/dL (ref 30.0–36.0)
MCV: 101 fL — ABNORMAL HIGH (ref 78.0–100.0)
Platelets: 186 10*3/uL (ref 150–400)
RBC: 2.98 MIL/uL — ABNORMAL LOW (ref 4.22–5.81)
RDW: 12.9 % (ref 11.5–15.5)
WBC: 8.4 10*3/uL (ref 4.0–10.5)

## 2014-12-11 LAB — PLATELET INHIBITION P2Y12: Platelet Function  P2Y12: 200 [PRU] (ref 194–418)

## 2014-12-11 NOTE — Progress Notes (Signed)
Subjective: No pain currently  Objective: Vital signs in last 24 hours: Temp:  [97.8 F (36.6 C)-98.4 F (36.9 C)] 98.1 F (36.7 C) (07/20 0700) Pulse Rate:  [59-90] 76 (07/20 0800) Resp:  [0-20] 19 (07/20 0700) BP: (77-165)/(46-79) 123/58 mmHg (07/20 0800) SpO2:  [88 %-98 %] 96 % (07/20 0800) Weight change:  Last BM Date: 12/10/14 Intake/Output from previous day: 07/19 0701 - 07/20 0700 In: 1403 [P.O.:480; I.V.:923] Out: 300 [Urine:300] Intake/Output this shift: Total I/O In: 46.5 [I.V.:46.5] Out: -   PE: Per Dr. Ellyn Hack General:Pleasant affect, NAD Skin:Warm and dry, brisk capillary refill HEENT:normocephalic, sclera clear, mucus membranes moist Neck:supple, no JVD, no bruits  Heart:S1S2 RRR without murmur, gallup, rub or click Lungs:clear without rales, rhonchi, or wheezes [id number], non tender, + BS, do not palpate liver spleen or masses Ext:no lower ext edema, 2+ pedal pulses, 2+ radial pulses Neuro:alert and oriented, MAE, follows commands, + facial symmetry Tele: SR  Lab Results:  Recent Labs  12/10/14 0237 12/11/14 0233  WBC 8.8 8.4  HGB 11.1* 10.4*  HCT 31.5* 30.1*  PLT 188 186   BMET  Recent Labs  12/10/14 1017 12/11/14 0233  NA 130* 131*  K 3.8 3.6  CL 99* 99*  CO2 22 20*  GLUCOSE 100* 92  BUN 8 9  CREATININE 1.00 1.02  CALCIUM 8.5* 8.6*   No results for input(s): TROPONINI in the last 72 hours.  Invalid input(s): CK, MB  Lab Results  Component Value Date   CHOL 107 12/08/2014   HDL 55 12/08/2014   LDLCALC 41 12/08/2014   TRIG 57 12/08/2014   CHOLHDL 1.9 12/08/2014   Lab Results  Component Value Date   HGBA1C 5.3 12/07/2014     Lab Results  Component Value Date   TSH 0.74 01/12/2013    Hepatic Function Panel No results for input(s): PROT, ALBUMIN, AST, ALT, ALKPHOS, BILITOT, BILIDIR, IBILI in the last 72 hours. No results for input(s): CHOL in the last 72 hours. No results for input(s): PROTIME in the last  72 hours.     Studies/Results: Cardiac Cath:  Chronic occlusion of the RCA with left-to-right collaterals  Severe left-main equivalent disease involving the trifurcation point of the left main, ostial LAD, LCx, and ramus  99% stenosis of the ramus intermedius (suspected culprit lesion)  Moderate segmental LV systolic dysfunction with inferior wall akinesis, LVEF estimated at 45%  Severe calcified coronary arteries, aortic arch, and innominate artery  RECOMMENDATIONS: Surgical evaluation for CABG. See notes of Dr Sammuel Hines in Care Everywhere related to complex AAA. Pt with extensive vascular disease  Carotid Findings: Bilateral: 1-39% ICA stenosis. Vertebral artery flow is antegrade  Medications: I have reviewed the patient's current medications. Scheduled Meds: . aspirin  81 mg Oral Daily  . carvedilol  37.5 mg Oral BID WC  . famotidine  20 mg Oral QHS  . hydrALAZINE  50 mg Oral BID  . lisinopril  20 mg Oral BID  . rosuvastatin  20 mg Oral QHS  . sodium chloride  3 mL Intravenous Q12H   Continuous Infusions: . sodium chloride 20 mL/hr at 12/11/14 0800  . heparin 1,450 Units/hr (12/11/14 0800)  . nitroGLYCERIN 30 mcg/min (12/11/14 0800)   PRN Meds:.sodium chloride, acetaminophen, cyclobenzaprine, hydrALAZINE, nitroGLYCERIN, ondansetron (ZOFRAN) IV, sodium chloride  Assessment/Plan: Principal Problem:   NSTEMI (non-ST elevated myocardial infarction)-pk troponin 7.83   CAD- significant -including severe Lt main. Dr. Cyndia Bent following- pt had been worked up  at [facility]- some unstable angina during the night.  NTG increased.  Will check EKG today  Hypotension, NTG decreased now without pain.  Will hold hydralazine.    Active Problems:   Atherosclerosis of native coronary artery with unstable angina pectoris- severe multivessel   Essential hypertension, benign   Hyperlipidemia with target LDL less than 70   PAD (peripheral artery disease)   Carotid stenosis, bilateral    Atherosclerosis of native arteries of extremity with intermittent claudication   AAA (abdominal aortic aneurysm) without rupture   Intermittent claudication   Hyponatremia   LV dysfunction with EF 45%.  Has been seen by Dr. Cyndia Bent.   p2Y12 is 200 this AM -plavix last dose 12/09/14 - would anticipate > 250 by Friday On BB, ACE, IV NTG, will hold lisinopril and hydralizine for BP < 100.     LOS: 4 days   Time spent with pt. :15 minutes. [facility] R  Nurse Practitioner Certified Pager [phone] or after 5pm and on weekends call [phone] 12/11/2014, 9:11 AM   I have seen, examined and evaluated the patient this AM along with Cecilie Kicks, NP-C.  After reviewing all the available data and chart,  I agree with her findings, examination as well as impression recommendations.  MV CAD with CT Sgx c/s for CABG - still requiring NTG gtt for CP  - but BP labile with titration.; on Heparin gtt (appreciate pharmacist team assistance) -- was on Plavix (given Monday) - P2 Y12 assay 200 (hopefully can expedite surgery) - would anticipate > 250 by Friday -- Appreciate CT Sgx consult - ? CABG on Friday.  BP in 160s - on high dose BB (37.5 bid) & ACE-I (20 mg BID), increase Hydralazine to 50 mg BID; would use diuretic next (allow time for post-cath renal recovery) -- currently BP low with IV NTG - no room to titrate meds. (holding AM meds to allow BP to recover) On Crestor  Has complex AAA & PAD - to be re-assessed post-OP   HypoNa 2d ago - recheck labs. Holding off on diuretics  ON H2 blocker for GI prophylaxis.    Leonie Man, M.D., M.S. Interventional Cardiologist   Pager # [phone]

## 2014-12-11 NOTE — Progress Notes (Signed)
Pre-op Cardiac Surgery    Upper Extremity Right Left  Brachial Pressures 118 130  Radial Waveforms Tri Tri  Ulnar Waveforms Tri Tri  Palmar Arch (Allen's Test) Decreases less than 50% with radial compression, normal with ulnar compression Decreases greater than 50% with radial compression, normal with ulnar compression      Lower  Extremity Right Left      Anterior Tibial 104, Mono 94, Bi  Posterior Tibial 77, Mono 93, Bi  Ankle/Brachial Indices 0.88 0.72, Hx stent    Landry Mellow, RDMS, RVT 12/11/2014

## 2014-12-11 NOTE — Progress Notes (Signed)
2 Days Post-Op Procedure(s) (LRB): Left Heart Cath and Coronary Angiography (N/A) Subjective:  Had brief pain last pm but none today on heparin and NTG drips.  Objective: Vital signs in last 24 hours: Temp:  [97.7 F (36.5 C)-98.1 F (36.7 C)] 97.7 F (36.5 C) (07/20 1200) Pulse Rate:  [63-90] 74 (07/20 1800) Cardiac Rhythm:  [-] Normal sinus rhythm (07/20 0730) Resp:  [19-20] 20 (07/20 1200) BP: (77-169)/(46-71) 145/62 mmHg (07/20 1800) SpO2:  [88 %-98 %] 96 % (07/20 1800)  Hemodynamic parameters for last 24 hours:    Intake/Output from previous day: 07/19 0701 - 07/20 0700 In: 1403 [P.O.:480; I.V.:923] Out: 300 [Urine:300] Intake/Output this shift:    General appearance: alert and cooperative Heart: regular rate and rhythm, S1, S2 normal, no murmur, click, rub or gallop Lungs: clear to auscultation bilaterally  Lab Results:  Recent Labs  12/10/14 0237 12/11/14 0233  WBC 8.8 8.4  HGB 11.1* 10.4*  HCT 31.5* 30.1*  PLT 188 186   BMET:  Recent Labs  12/10/14 1017 12/11/14 0233  NA 130* 131*  K 3.8 3.6  CL 99* 99*  CO2 22 20*  GLUCOSE 100* 92  BUN 8 9  CREATININE 1.00 1.02  CALCIUM 8.5* 8.6*    PT/INR: No results for input(s): LABPROT, INR in the last 72 hours. ABG    Component Value Date/Time   PHART 7.461* 05/29/2012 1338   HCO3 22.7 05/29/2012 1338   TCO2 23.6 05/29/2012 1338   ACIDBASEDEF 0.7 05/29/2012 1338   O2SAT 96.7 05/29/2012 1338   CBG (last 3)  No results for input(s): GLUCAP in the last 72 hours.  Assessment/Plan:  Severe multi-vessel coronary disease. His P2Y12 level was 200. Will plan to do CABG Friday am. His platelet function should be adequate by then.  LOS: 4 days    Gaye Pollack 12/11/2014

## 2014-12-11 NOTE — Progress Notes (Signed)
ANTICOAGULATION CONSULT NOTE - Follow Up Consult  Pharmacy Consult for heparin Indication: CAD awaiting CVTS consult   Labs:  Recent Labs  12/09/14 0135 12/10/14 0237 12/10/14 1017 12/10/14 1050 12/11/14 0233  HGB 11.0* 11.1*  --   --  10.4*  HCT 31.4* 31.5*  --   --  30.1*  PLT 195 188  --   --  186  HEPARINUNFRC 0.52 0.34  --  0.52 0.51  CREATININE  --   --  1.00  --  1.02     Assessment/Plan:  64 yo man admitted 12/07/2014 for worsening chest pain over few days.   PMH: HLD, CAD, AAA, PAD, COPD, Raynauds, LDDD, CVA, GERD  Pharmacy consulted to dose heparin.  [id number] for CP/concern for UA  Post cath, no intervention. Pt with extensive vascular disease. Heparin restarted 2h after TR band removal. TR band off at 1800. Restarted heparin at 1450 units/hr, which was therapeutic rate prior to cath  H/H 10.4/30.1, plts WNL, troponin up to 7.83, no AC PTA  HL= 0.51, therapeutic (goal 0.3-0.7) on 1450 units/hr  Goal of Therapy:  Heparin level 0.3-0.7 units/ml Monitor platelets by anticoagulation protocol: Yes  Plan:  - Continue heparin at 1450units/hr  - Daily HL and CBC - CABG planned for Friday  Heloise Ochoa, PharmD  12/11/2014,11:37 AM   Discussed with resident and agree with above  Bonnita Nasuti Pharm.D. CPP, BCPS Clinical Pharmacist [phone] 12/13/2014 7:32 AM

## 2014-12-12 DIAGNOSIS — I25110 Atherosclerotic heart disease of native coronary artery with unstable angina pectoris: Secondary | ICD-10-CM

## 2014-12-12 LAB — POCT I-STAT 3, ART BLOOD GAS (G3+)
Acid-base deficit: 2 mmol/L (ref 0.0–2.0)
Bicarbonate: 21.5 mEq/L (ref 20.0–24.0)
O2 Saturation: 95 %
PCO2 ART: 31.5 mmHg — AB (ref 35.0–45.0)
Patient temperature: 97.6
TCO2: 22 mmol/L (ref 0–100)
pH, Arterial: 7.441 (ref 7.350–7.450)
pO2, Arterial: 70 mmHg — ABNORMAL LOW (ref 80.0–100.0)

## 2014-12-12 LAB — CBC
HCT: 30.3 % — ABNORMAL LOW (ref 39.0–52.0)
Hemoglobin: 10.6 g/dL — ABNORMAL LOW (ref 13.0–17.0)
MCH: 35.1 pg — ABNORMAL HIGH (ref 26.0–34.0)
MCHC: 35 g/dL (ref 30.0–36.0)
MCV: 100.3 fL — ABNORMAL HIGH (ref 78.0–100.0)
Platelets: 188 10*3/uL (ref 150–400)
RBC: 3.02 MIL/uL — ABNORMAL LOW (ref 4.22–5.81)
RDW: 12.9 % (ref 11.5–15.5)
WBC: 8.3 10*3/uL (ref 4.0–10.5)

## 2014-12-12 LAB — HEPARIN LEVEL (UNFRACTIONATED): HEPARIN UNFRACTIONATED: 0.63 [IU]/mL (ref 0.30–0.70)

## 2014-12-12 LAB — BASIC METABOLIC PANEL
Anion gap: 10 (ref 5–15)
BUN: 9 mg/dL (ref 6–20)
CHLORIDE: 99 mmol/L — AB (ref 101–111)
CO2: 22 mmol/L (ref 22–32)
Calcium: 8.7 mg/dL — ABNORMAL LOW (ref 8.9–10.3)
Creatinine, Ser: 0.97 mg/dL (ref 0.61–1.24)
GFR calc Af Amer: >60 mL/min (ref >60)
GFR calc non Af Amer: >60 mL/min (ref >60)
Glucose, Bld: 95 mg/dL (ref 65–99)
POTASSIUM: 3.3 mmol/L — AB (ref 3.5–5.1)
Sodium: 131 mmol/L — ABNORMAL LOW (ref 135–145)

## 2014-12-12 LAB — PLATELET INHIBITION P2Y12: Platelet Function  P2Y12: 246 [PRU] (ref 194–418)

## 2014-12-12 LAB — SURGICAL PCR SCREEN
MRSA, PCR: NEGATIVE
STAPHYLOCOCCUS AUREUS: NEGATIVE

## 2014-12-12 MED ORDER — DOPAMINE-DEXTROSE 3.2-5 MG/ML-% IV SOLN
0.0000 ug/kg/min | INTRAVENOUS | Status: DC
  Filled 2014-12-12: qty 250

## 2014-12-12 MED ORDER — NITROGLYCERIN IN D5W 200-5 MCG/ML-% IV SOLN
2.0000 ug/min | INTRAVENOUS | Status: DC
  Filled 2014-12-12: qty 250

## 2014-12-12 MED ORDER — METOPROLOL TARTRATE 12.5 MG HALF TABLET
12.5000 mg | ORAL_TABLET | Freq: Once | ORAL | Status: AC
  Administered 2014-12-13: 12.5 mg via ORAL
  Filled 2014-12-12: qty 1

## 2014-12-12 MED ORDER — POTASSIUM CHLORIDE CRYS ER 20 MEQ PO TBCR
20.0000 meq | EXTENDED_RELEASE_TABLET | Freq: Every day | ORAL | Status: DC
  Filled 2014-12-12: qty 1

## 2014-12-12 MED ORDER — SODIUM CHLORIDE 0.9 % IV SOLN
INTRAVENOUS | Status: DC
  Filled 2014-12-12: qty 30

## 2014-12-12 MED ORDER — CHLORHEXIDINE GLUCONATE CLOTH 2 % EX PADS: 6.0000 | MEDICATED_PAD | Freq: Once | CUTANEOUS | Status: AC

## 2014-12-12 MED ORDER — SODIUM CHLORIDE 0.9 % IV SOLN
INTRAVENOUS | Status: AC
  Administered 2014-12-13: 69.8 mL/h via INTRAVENOUS
  Filled 2014-12-12: qty 40

## 2014-12-12 MED ORDER — DEXTROSE 5 % IV SOLN
750.0000 mg | INTRAVENOUS | Status: DC
  Filled 2014-12-12 (×2): qty 750

## 2014-12-12 MED ORDER — EPINEPHRINE HCL 1 MG/ML IJ SOLN
0.0000 ug/min | INTRAVENOUS | Status: DC
  Filled 2014-12-12: qty 4

## 2014-12-12 MED ORDER — TEMAZEPAM 15 MG PO CAPS: 15.0000 mg | ORAL_CAPSULE | Freq: Once | ORAL | Status: AC | PRN

## 2014-12-12 MED ORDER — MAGNESIUM SULFATE 50 % IJ SOLN
40.0000 meq | INTRAMUSCULAR | Status: DC
  Filled 2014-12-12: qty 10

## 2014-12-12 MED ORDER — PLASMA-LYTE 148 IV SOLN
INTRAVENOUS | Status: AC
  Administered 2014-12-13: 500 mL
  Filled 2014-12-12: qty 2.5

## 2014-12-12 MED ORDER — PHENYLEPHRINE HCL 10 MG/ML IJ SOLN
30.0000 ug/min | INTRAVENOUS | Status: AC
  Administered 2014-12-13: 25 ug/min via INTRAVENOUS
  Filled 2014-12-12: qty 2

## 2014-12-12 MED ORDER — DEXMEDETOMIDINE HCL IN NACL 400 MCG/100ML IV SOLN
0.1000 ug/kg/h | INTRAVENOUS | Status: AC
  Administered 2014-12-13: .2 ug/kg/h via INTRAVENOUS
  Filled 2014-12-12: qty 100

## 2014-12-12 MED ORDER — POTASSIUM CHLORIDE CRYS ER 20 MEQ PO TBCR
40.0000 meq | EXTENDED_RELEASE_TABLET | Freq: Once | ORAL | Status: AC
Start: 2014-12-12 — End: 2014-12-12
  Administered 2014-12-12: 40 meq via ORAL
  Filled 2014-12-12: qty 2

## 2014-12-12 MED ORDER — POTASSIUM CHLORIDE 2 MEQ/ML IV SOLN
80.0000 meq | INTRAVENOUS | Status: DC
  Filled 2014-12-12: qty 40

## 2014-12-12 MED ORDER — ALPRAZOLAM 0.25 MG PO TABS
0.2500 mg | ORAL_TABLET | ORAL | Status: DC | PRN
  Filled 2014-12-12: qty 2

## 2014-12-12 MED ORDER — CHLORHEXIDINE GLUCONATE CLOTH 2 % EX PADS
6.0000 | MEDICATED_PAD | Freq: Once | CUTANEOUS | Status: AC
  Administered 2014-12-12: 6 via TOPICAL

## 2014-12-12 MED ORDER — DIAZEPAM 5 MG PO TABS
5.0000 mg | ORAL_TABLET | Freq: Once | ORAL | Status: AC
  Administered 2014-12-13: 5 mg via ORAL
  Filled 2014-12-12: qty 1

## 2014-12-12 MED ORDER — SODIUM CHLORIDE 0.9 % IV SOLN
INTRAVENOUS | Status: AC
  Administered 2014-12-13: 1 [IU]/h via INTRAVENOUS
  Filled 2014-12-12: qty 2.5

## 2014-12-12 MED ORDER — BISACODYL 5 MG PO TBEC
5.0000 mg | DELAYED_RELEASE_TABLET | Freq: Once | ORAL | Status: AC
  Administered 2014-12-12: 5 mg via ORAL
  Filled 2014-12-12: qty 1

## 2014-12-12 MED ORDER — VANCOMYCIN HCL 10 G IV SOLR
1500.0000 mg | INTRAVENOUS | Status: AC
  Administered 2014-12-13: 1500 mg via INTRAVENOUS
  Filled 2014-12-12: qty 1500

## 2014-12-12 MED ORDER — DEXTROSE 5 % IV SOLN
1.5000 g | INTRAVENOUS | Status: AC
  Administered 2014-12-13: 1.5 g via INTRAVENOUS
  Administered 2014-12-13: .75 g via INTRAVENOUS
  Filled 2014-12-12: qty 1.5

## 2014-12-12 NOTE — Care Management Important Message (Signed)
Important Message  Patient Details  Name: [name] MRN: [id number] Date of Birth: May 10, 1951   Medicare Important Message Given:  Yes-third notification given    Pricilla Handler 12/12/2014, 1:40 PM

## 2014-12-12 NOTE — Progress Notes (Signed)
ANTICOAGULATION CONSULT NOTE - Follow Up Consult  Pharmacy Consult for heparin Indication: CAD awaiting CVTS consult   Labs:  Recent Labs  12/10/14 0237 12/10/14 1017 12/10/14 1050 12/11/14 0233 12/12/14 0310  HGB 11.1*  --   --  10.4* 10.6*  HCT 31.5*  --   --  30.1* 30.3*  PLT 188  --   --  186 188  HEPARINUNFRC 0.34  --  0.52 0.51 0.63  CREATININE  --  1.00  --  1.02 0.97     Assessment/Plan:  64 yo man admitted 12/07/2014 for worsening chest pain over few days.   PMH: HLD, CAD, AAA, PAD, COPD, Raynauds, LDDD, CVA, GERD  Pharmacy consulted to dose heparin.  [id number] for CP/concern for UA  Post cath, no intervention. Pt with extensive vascular disease. Heparin restarted 2h after TR band removal. TR band off at 1800. Restarted heparin at 1450 units/hr, which was therapeutic rate prior to cath  H/H 10.6/30.3 stable, plts WNL, troponin up to 7.83, no AC PTA  HL= 0.63, therapeutic (goal 0.3-0.7) on 1450 units/hr  Goal of Therapy:  Heparin level 0.3-0.7 units/ml Monitor platelets by anticoagulation protocol: Yes  Plan:  - Continue heparin at 1450units/hr  - Daily HL and CBC - CABG planned for Friday  Heloise Ochoa, PharmD  12/12/2014,7:44 AM  I agree with the above assessment and plan. CBC stable, no bleeding noted. Will continue heparin at current rate. Follow up plans for surgery in am.  Erin Hearing PharmD., BCPS Clinical Pharmacist Pager [phone] 12/12/2014 8:18 AM

## 2014-12-12 NOTE — Progress Notes (Signed)
Subjective: No pain currently  Objective: Vital signs in last 24 hours: Temp:  [97.7 F (36.5 C)-98.3 F (36.8 C)] 98.3 F (36.8 C) (07/21 0800) Pulse Rate:  [62-80] 64 (07/21 0800) Resp:  [16-20] 16 (07/21 0730) BP: (87-169)/(46-76) 140/65 mmHg (07/21 0817) SpO2:  [89 %-98 %] 96 % (07/21 0800) Weight change:  Last BM Date: 12/11/14 Intake/Output from previous day: 07/20 0701 - 07/21 0700 In: 1871.6 [P.O.:1060; I.V.:811.6] Out: 300 [Urine:300] Intake/Output this shift: Total I/O In: 23.5 [I.V.:23.5] Out: -   PE: General:Pleasant affect, NAD Skin:Warm and dry, brisk capillary refill HEENT:normocephalic, sclera clear, mucus membranes moist Neck:supple, no JVD, no bruits  Heart:S1S2 RRR without murmur, gallup, rub or click Lungs:clear without rales, rhonchi, or wheezes [id number], non tender, + BS, do not palpate liver spleen or masses Ext:no lower ext edema, 2+ pedal pulses, 2+ radial pulses Neuro:alert and oriented, MAE, follows commands, + facial symmetry Tele: SR  Lab Results:  Recent Labs  12/11/14 0233 12/12/14 0310  WBC 8.4 8.3  HGB 10.4* 10.6*  HCT 30.1* 30.3*  PLT 186 188   BMET  Recent Labs  12/11/14 0233 12/12/14 0310  NA 131* 131*  K 3.6 3.3*  CL 99* 99*  CO2 20* 22  GLUCOSE 92 95  BUN 9 9  CREATININE 1.02 0.97  CALCIUM 8.6* 8.7*   No results for input(s): TROPONINI in the last 72 hours.  Invalid input(s): CK, MB  Lab Results  Component Value Date   CHOL 107 12/08/2014   HDL 55 12/08/2014   LDLCALC 41 12/08/2014   TRIG 57 12/08/2014   CHOLHDL 1.9 12/08/2014   Lab Results  Component Value Date   HGBA1C 5.3 12/07/2014     Lab Results  Component Value Date   TSH 0.74 01/12/2013    Hepatic Function Panel No results for input(s): PROT, ALBUMIN, AST, ALT, ALKPHOS, BILITOT, BILIDIR, IBILI in the last 72 hours. No results for input(s): CHOL in the last 72 hours. No results for input(s): PROTIME in the last 72  hours.  Studies/Results: Cardiac Cath:  Chronic occlusion of the RCA with left-to-right collaterals  Severe left-main equivalent disease involving the trifurcation point of the left main, ostial LAD, LCx, and ramus  99% stenosis of the ramus intermedius (suspected culprit lesion)  Moderate segmental LV systolic dysfunction with inferior wall akinesis, LVEF estimated at 45%  Severe calcified coronary arteries, aortic arch, and innominate artery  RECOMMENDATIONS: Surgical evaluation for CABG. See notes of Dr Sammuel Hines in Care Everywhere related to complex AAA. Pt with extensive vascular disease  Carotid Findings: Bilateral: 1-39% ICA stenosis. Vertebral artery flow is antegrade  Medications: I have reviewed the patient's current medications. Scheduled Meds: . aspirin  81 mg Oral Daily  . carvedilol  37.5 mg Oral BID WC  . famotidine  20 mg Oral QHS  . hydrALAZINE  50 mg Oral BID  . lisinopril  20 mg Oral BID  . rosuvastatin  20 mg Oral QHS  . sodium chloride  3 mL Intravenous Q12H   Continuous Infusions: . sodium chloride Stopped (12/12/14 0548)  . heparin 1,450 Units/hr (12/12/14 0700)  . nitroGLYCERIN 30 mcg/min (12/12/14 0700)   PRN Meds:.sodium chloride, acetaminophen, cyclobenzaprine, hydrALAZINE, nitroGLYCERIN, ondansetron (ZOFRAN) IV, sodium chloride  Assessment/Plan: Principal Problem:   NSTEMI (non-ST elevated myocardial infarction)-pk troponin 7.83   CAD- significant -including severe Lt main. Dr. Cyndia Bent following- pt had been worked up at [facility]- some unstable angina during the night.  NTG increased.  Will check  EKG today  Hypotension, NTG decreased now without pain.  Will hold hydralazine.    Active Problems:   Atherosclerosis of native coronary artery with unstable angina pectoris- severe multivessel   Essential hypertension, benign   Hyperlipidemia with target LDL less than 70   Atherosclerosis of native arteries of extremity with intermittent claudication    AAA (abdominal aortic aneurysm) without rupture   Intermittent claudication   Hyponatremia   LV dysfunction with EF 45%.   HypoKalemia   LOS: 5 days   MV CAD with CT Sgx c/s for CABG - still requiring NTG gtt for CP  - but BP labile with titration.; on Heparin gtt (appreciate pharmacist team assistance) -- was on Plavix (given Monday) - P2 Y12 assay 200 (hopefully can expedite surgery) - would anticipate > 250 by Friday -- Appreciate CT Sgx consult -  CABG on Friday. -- No CHF Sx, hold off on diuresis; on BB & Hydral/Nitrate for afterload in addition to ACE-I  SBP improved to 110-140; - on high dose BB (37.5 bid) & ACE-I (20 mg BID), increased Hydralazine to 50 mg BID yesterday ; would use diuretic next (allow time for post-cath renal recovery) -- remains on IV NTG - no room to titrate meds. On Crestor  Has complex AAA & PAD - to be re-assessed post-OP   HypoNa - back to 131 & stable; Holding off on diuretics; HypoKalemia - supplemental K+  ON H2 blocker for GI prophylaxis.    Leonie Man, M.D., M.S. Interventional Cardiologist   Pager # [phone]

## 2014-12-12 NOTE — Progress Notes (Signed)
3 Days Post-Op Procedure(s) (LRB): Left Heart Cath and Coronary Angiography (N/A) Subjective:  Some chest pain last night but none today.  Objective: Vital signs in last 24 hours: Temp:  [97.9 F (36.6 C)-98.3 F (36.8 C)] 98.2 F (36.8 C) (07/21 1145) Pulse Rate:  [62-80] 69 (07/21 1300) Cardiac Rhythm:  [-] Normal sinus rhythm (07/21 0800) Resp:  [16-18] 18 (07/21 1145) BP: (79-169)/(46-76) 136/70 mmHg (07/21 1145) SpO2:  [89 %-98 %] 96 % (07/21 1300)  Hemodynamic parameters for last 24 hours:    Intake/Output from previous day: 07/20 0701 - 07/21 0700 In: 1871.6 [P.O.:1060; I.V.:811.6] Out: 300 [Urine:300] Intake/Output this shift: Total I/O In: 369.5 [P.O.:240; I.V.:129.5] Out: 375 [Urine:375]  General appearance: alert and cooperative Heart: regular rate and rhythm, S1, S2 normal, no murmur, click, rub or gallop Lungs: clear to auscultation bilaterally  Lab Results:  Recent Labs  12/11/14 0233 12/12/14 0310  WBC 8.4 8.3  HGB 10.4* 10.6*  HCT 30.1* 30.3*  PLT 186 188   BMET:  Recent Labs  12/11/14 0233 12/12/14 0310  NA 131* 131*  K 3.6 3.3*  CL 99* 99*  CO2 20* 22  GLUCOSE 92 95  BUN 9 9  CREATININE 1.02 0.97  CALCIUM 8.6* 8.7*    PT/INR: No results for input(s): LABPROT, INR in the last 72 hours. ABG    Component Value Date/Time   PHART 7.461* 05/29/2012 1338   HCO3 22.7 05/29/2012 1338   TCO2 23.6 05/29/2012 1338   ACIDBASEDEF 0.7 05/29/2012 1338   O2SAT 96.7 05/29/2012 1338   CBG (last 3)  No results for input(s): GLUCAP in the last 72 hours.  Assessment/Plan:  Severe multi-vessel coronary disease. Plan CABG tomorrow. I discussed the operative procedure with the patient and his wife including alternatives, benefits and risks; including but not limited to bleeding, blood transfusion, infection, stroke, myocardial infarction, graft failure, heart block requiring a permanent pacemaker, organ dysfunction, and death.  [name]  understands and agrees to proceed.    LOS: 5 days    Gaye Pollack 12/12/2014

## 2014-12-13 ENCOUNTER — Inpatient Hospital Stay (HOSPITAL_COMMUNITY): Payer: Medicare Other | Admitting: Certified Registered"

## 2014-12-13 ENCOUNTER — Inpatient Hospital Stay (HOSPITAL_COMMUNITY)
Admit: 2014-12-13 | Discharge: 2014-12-13 | Disposition: A | Discharge status: Home / Self Care | Payer: Medicare Other | Attending: Surgery | Admitting: Surgery

## 2014-12-13 ENCOUNTER — Encounter (HOSPITAL_COMMUNITY)
Admission: EM | Disposition: A | Discharge status: Home Health | Payer: Medicare Other | Source: Home / Self Care | Attending: Interventional Cardiology

## 2014-12-13 ENCOUNTER — Inpatient Hospital Stay (HOSPITAL_COMMUNITY): Payer: Medicare Other

## 2014-12-13 DIAGNOSIS — I2510 Atherosclerotic heart disease of native coronary artery without angina pectoris: Secondary | ICD-10-CM | POA: Diagnosis present

## 2014-12-13 HISTORY — PX: INTRAOPERATIVE TRANSESOPHAGEAL ECHOCARDIOGRAM: SHX5062

## 2014-12-13 HISTORY — PX: CORONARY ARTERY BYPASS GRAFT: SHX141

## 2014-12-13 LAB — CBC
HEMATOCRIT: 30 % — AB (ref 39.0–52.0)
HEMATOCRIT: 25.6 % — AB (ref 39.0–52.0)
HEMATOCRIT: 29.2 % — AB (ref 39.0–52.0)
HEMOGLOBIN: 10.4 g/dL — AB (ref 13.0–17.0)
HEMOGLOBIN: 10.3 g/dL — AB (ref 13.0–17.0)
Hemoglobin: 9.1 g/dL — ABNORMAL LOW (ref 13.0–17.0)
MCH: 34.9 pg — AB (ref 26.0–34.0)
MCH: 35.5 pg — AB (ref 26.0–34.0)
MCH: 35.2 pg — AB (ref 26.0–34.0)
MCHC: 34.7 g/dL (ref 30.0–36.0)
MCHC: 35.5 g/dL (ref 30.0–36.0)
MCHC: 35.3 g/dL (ref 30.0–36.0)
MCV: 100.7 fL — ABNORMAL HIGH (ref 78.0–100.0)
MCV: 100 fL (ref 78.0–100.0)
MCV: 99.7 fL (ref 78.0–100.0)
PLATELETS: 108 10*3/uL — AB (ref 150–400)
Platelets: 189 10*3/uL (ref 150–400)
Platelets: 141 10*3/uL — ABNORMAL LOW (ref 150–400)
RBC: 2.98 MIL/uL — ABNORMAL LOW (ref 4.22–5.81)
RBC: 2.56 MIL/uL — ABNORMAL LOW (ref 4.22–5.81)
RBC: 2.93 MIL/uL — ABNORMAL LOW (ref 4.22–5.81)
RDW: 12.8 % (ref 11.5–15.5)
RDW: 12.8 % (ref 11.5–15.5)
RDW: 12.9 % (ref 11.5–15.5)
WBC: 8.5 10*3/uL (ref 4.0–10.5)
WBC: 10.7 10*3/uL — AB (ref 4.0–10.5)
WBC: 13.7 10*3/uL — AB (ref 4.0–10.5)

## 2014-12-13 LAB — POCT I-STAT 7, (LYTES, BLD GAS, ICA,H+H)
Acid-base deficit: 4 mmol/L — ABNORMAL HIGH (ref 0.0–2.0)
Bicarbonate: 22.7 mEq/L (ref 20.0–24.0)
Calcium, Ion: 1.32 mmol/L — ABNORMAL HIGH (ref 1.13–1.30)
HCT: 27 % — ABNORMAL LOW (ref 39.0–52.0)
Hemoglobin: 9.2 g/dL — ABNORMAL LOW (ref 13.0–17.0)
O2 Saturation: 100 %
PCO2 ART: 45.1 mmHg — AB (ref 35.0–45.0)
POTASSIUM: 3.3 mmol/L — AB (ref 3.5–5.1)
Sodium: 132 mmol/L — ABNORMAL LOW (ref 135–145)
TCO2: 24 mmol/L (ref 0–100)
pH, Arterial: 7.299 — ABNORMAL LOW (ref 7.350–7.450)
pO2, Arterial: 197 mmHg — ABNORMAL HIGH (ref 80.0–100.0)

## 2014-12-13 LAB — POCT I-STAT 3, ART BLOOD GAS (G3+)
ACID-BASE DEFICIT: 2 mmol/L (ref 0.0–2.0)
ACID-BASE EXCESS: 2 mmol/L (ref 0.0–2.0)
Acid-base deficit: 1 mmol/L (ref 0.0–2.0)
Acid-base deficit: 1 mmol/L (ref 0.0–2.0)
Acid-base deficit: 1 mmol/L (ref 0.0–2.0)
BICARBONATE: 25.7 meq/L — AB (ref 20.0–24.0)
BICARBONATE: 23.2 meq/L (ref 20.0–24.0)
Bicarbonate: 23.6 mEq/L (ref 20.0–24.0)
Bicarbonate: 23.7 mEq/L (ref 20.0–24.0)
Bicarbonate: 21.9 mEq/L (ref 20.0–24.0)
O2 SAT: 96 %
O2 SAT: 94 %
O2 Saturation: 100 %
O2 Saturation: 89 %
O2 Saturation: 93 %
PCO2 ART: 32.9 mmHg — AB (ref 35.0–45.0)
PCO2 ART: 36.3 mmHg (ref 35.0–45.0)
PCO2 ART: 33.4 mmHg — AB (ref 35.0–45.0)
PCO2 ART: 36.6 mmHg (ref 35.0–45.0)
PCO2 ART: 34.8 mmHg — AB (ref 35.0–45.0)
PH ART: 7.445 (ref 7.350–7.450)
Patient temperature: 34.5
Patient temperature: 37
Patient temperature: 36
Patient temperature: 37
Patient temperature: 37.5
TCO2: 27 mmol/L (ref 0–100)
TCO2: 25 mmol/L (ref 0–100)
TCO2: 24 mmol/L (ref 0–100)
TCO2: 25 mmol/L (ref 0–100)
TCO2: 23 mmol/L (ref 0–100)
pH, Arterial: 7.491 — ABNORMAL HIGH (ref 7.350–7.450)
pH, Arterial: 7.421 (ref 7.350–7.450)
pH, Arterial: 7.419 (ref 7.350–7.450)
pH, Arterial: 7.41 (ref 7.350–7.450)
pO2, Arterial: 323 mmHg — ABNORMAL HIGH (ref 80.0–100.0)
pO2, Arterial: 81 mmHg (ref 80.0–100.0)
pO2, Arterial: 51 mmHg — ABNORMAL LOW (ref 80.0–100.0)
pO2, Arterial: 65 mmHg — ABNORMAL LOW (ref 80.0–100.0)
pO2, Arterial: 70 mmHg — ABNORMAL LOW (ref 80.0–100.0)

## 2014-12-13 LAB — POCT I-STAT, CHEM 8
BUN: 10 mg/dL (ref 6–20)
BUN: 8 mg/dL (ref 6–20)
BUN: 7 mg/dL (ref 6–20)
BUN: 7 mg/dL (ref 6–20)
BUN: 7 mg/dL (ref 6–20)
BUN: 7 mg/dL (ref 6–20)
CALCIUM ION: 1.13 mmol/L (ref 1.13–1.30)
CALCIUM ION: 1.16 mmol/L (ref 1.13–1.30)
CALCIUM ION: 1.2 mmol/L (ref 1.13–1.30)
CHLORIDE: 98 mmol/L — AB (ref 101–111)
CHLORIDE: 95 mmol/L — AB (ref 101–111)
CHLORIDE: 96 mmol/L — AB (ref 101–111)
CHLORIDE: 99 mmol/L — AB (ref 101–111)
CREATININE: 0.8 mg/dL (ref 0.61–1.24)
Calcium, Ion: 1.27 mmol/L (ref 1.13–1.30)
Calcium, Ion: 1.26 mmol/L (ref 1.13–1.30)
Calcium, Ion: 1.14 mmol/L (ref 1.13–1.30)
Chloride: 97 mmol/L — ABNORMAL LOW (ref 101–111)
Chloride: 95 mmol/L — ABNORMAL LOW (ref 101–111)
Creatinine, Ser: 0.9 mg/dL (ref 0.61–1.24)
Creatinine, Ser: 0.8 mg/dL (ref 0.61–1.24)
Creatinine, Ser: 0.9 mg/dL (ref 0.61–1.24)
Creatinine, Ser: 0.9 mg/dL (ref 0.61–1.24)
Creatinine, Ser: 0.9 mg/dL (ref 0.61–1.24)
GLUCOSE: 112 mg/dL — AB (ref 65–99)
Glucose, Bld: 111 mg/dL — ABNORMAL HIGH (ref 65–99)
Glucose, Bld: 108 mg/dL — ABNORMAL HIGH (ref 65–99)
Glucose, Bld: 118 mg/dL — ABNORMAL HIGH (ref 65–99)
Glucose, Bld: 127 mg/dL — ABNORMAL HIGH (ref 65–99)
Glucose, Bld: 123 mg/dL — ABNORMAL HIGH (ref 65–99)
HCT: 30 % — ABNORMAL LOW (ref 39.0–52.0)
HCT: 23 % — ABNORMAL LOW (ref 39.0–52.0)
HCT: 29 % — ABNORMAL LOW (ref 39.0–52.0)
HEMATOCRIT: 28 % — AB (ref 39.0–52.0)
HEMATOCRIT: 23 % — AB (ref 39.0–52.0)
HEMATOCRIT: 30 % — AB (ref 39.0–52.0)
HEMOGLOBIN: 7.8 g/dL — AB (ref 13.0–17.0)
HEMOGLOBIN: 7.8 g/dL — AB (ref 13.0–17.0)
Hemoglobin: 10.2 g/dL — ABNORMAL LOW (ref 13.0–17.0)
Hemoglobin: 9.5 g/dL — ABNORMAL LOW (ref 13.0–17.0)
Hemoglobin: 9.9 g/dL — ABNORMAL LOW (ref 13.0–17.0)
Hemoglobin: 10.2 g/dL — ABNORMAL LOW (ref 13.0–17.0)
POTASSIUM: 3.7 mmol/L (ref 3.5–5.1)
POTASSIUM: 4.3 mmol/L (ref 3.5–5.1)
Potassium: 3.3 mmol/L — ABNORMAL LOW (ref 3.5–5.1)
Potassium: 3.8 mmol/L (ref 3.5–5.1)
Potassium: 3.9 mmol/L (ref 3.5–5.1)
Potassium: 3.8 mmol/L (ref 3.5–5.1)
SODIUM: 132 mmol/L — AB (ref 135–145)
Sodium: 132 mmol/L — ABNORMAL LOW (ref 135–145)
Sodium: 130 mmol/L — ABNORMAL LOW (ref 135–145)
Sodium: 130 mmol/L — ABNORMAL LOW (ref 135–145)
Sodium: 130 mmol/L — ABNORMAL LOW (ref 135–145)
Sodium: 133 mmol/L — ABNORMAL LOW (ref 135–145)
TCO2: 24 mmol/L (ref 0–100)
TCO2: 22 mmol/L (ref 0–100)
TCO2: 23 mmol/L (ref 0–100)
TCO2: 24 mmol/L (ref 0–100)
TCO2: 21 mmol/L (ref 0–100)
TCO2: 22 mmol/L (ref 0–100)

## 2014-12-13 LAB — HEMOGLOBIN AND HEMATOCRIT, BLOOD
HCT: 21.1 % — ABNORMAL LOW (ref 39.0–52.0)
HEMOGLOBIN: 7.5 g/dL — AB (ref 13.0–17.0)

## 2014-12-13 LAB — HEPARIN LEVEL (UNFRACTIONATED): Heparin Unfractionated: 0.58 IU/mL (ref 0.30–0.70)

## 2014-12-13 LAB — POCT I-STAT 3, VENOUS BLOOD GAS (G3P V)
Acid-base deficit: 3 mmol/L — ABNORMAL HIGH (ref 0.0–2.0)
Bicarbonate: 22 mEq/L (ref 20.0–24.0)
O2 SAT: 79 %
PCO2 VEN: 33.8 mmHg — AB (ref 45.0–50.0)
TCO2: 23 mmol/L (ref 0–100)
pH, Ven: 7.411 — ABNORMAL HIGH (ref 7.250–7.300)
pO2, Ven: 37 mmHg (ref 30.0–45.0)

## 2014-12-13 LAB — POCT I-STAT 4, (NA,K, GLUC, HGB,HCT)
GLUCOSE: 107 mg/dL — AB (ref 65–99)
HCT: 33 % — ABNORMAL LOW (ref 39.0–52.0)
Hemoglobin: 11.2 g/dL — ABNORMAL LOW (ref 13.0–17.0)
POTASSIUM: 3.5 mmol/L (ref 3.5–5.1)
SODIUM: 131 mmol/L — AB (ref 135–145)

## 2014-12-13 LAB — GLUCOSE, CAPILLARY
GLUCOSE-CAPILLARY: 97 mg/dL (ref 65–99)
Glucose-Capillary: 101 mg/dL — ABNORMAL HIGH (ref 65–99)
Glucose-Capillary: 111 mg/dL — ABNORMAL HIGH (ref 65–99)
Glucose-Capillary: 108 mg/dL — ABNORMAL HIGH (ref 65–99)
Glucose-Capillary: 119 mg/dL — ABNORMAL HIGH (ref 65–99)
Glucose-Capillary: 118 mg/dL — ABNORMAL HIGH (ref 65–99)

## 2014-12-13 LAB — BASIC METABOLIC PANEL
Anion gap: 9 (ref 5–15)
BUN: 9 mg/dL (ref 6–20)
CALCIUM: 8.8 mg/dL — AB (ref 8.9–10.3)
CO2: 24 mmol/L (ref 22–32)
Chloride: 97 mmol/L — ABNORMAL LOW (ref 101–111)
Creatinine, Ser: 1.15 mg/dL (ref 0.61–1.24)
GFR calc Af Amer: >60 mL/min (ref >60)
GFR calc non Af Amer: >60 mL/min (ref >60)
GLUCOSE: 104 mg/dL — AB (ref 65–99)
POTASSIUM: 3.9 mmol/L (ref 3.5–5.1)
Sodium: 130 mmol/L — ABNORMAL LOW (ref 135–145)

## 2014-12-13 LAB — PLATELET COUNT: Platelets: 130 10*3/uL — ABNORMAL LOW (ref 150–400)

## 2014-12-13 LAB — CREATININE, SERUM
CREATININE: 1.14 mg/dL (ref 0.61–1.24)
GFR calc Af Amer: >60 mL/min (ref >60)
GFR calc non Af Amer: >60 mL/min (ref >60)

## 2014-12-13 LAB — PROTIME-INR
INR: 1.46 (ref 0.00–1.49)
PROTHROMBIN TIME: 17.9 s — AB (ref 11.6–15.2)

## 2014-12-13 LAB — HEMOGLOBIN A1C
HEMOGLOBIN A1C: 5.4 % (ref 4.8–5.6)
Mean Plasma Glucose: 108 mg/dL

## 2014-12-13 LAB — APTT: APTT: 35 s (ref 24–37)

## 2014-12-13 LAB — MAGNESIUM: Magnesium: 2.4 mg/dL (ref 1.7–2.4)

## 2014-12-13 LAB — PREPARE RBC (CROSSMATCH)

## 2014-12-13 SURGERY — CORONARY ARTERY BYPASS GRAFTING (CABG)
Anesthesia: General | Site: Chest

## 2014-12-13 MED ORDER — ASPIRIN EC 325 MG PO TBEC
325.0000 mg | DELAYED_RELEASE_TABLET | Freq: Every day | ORAL | Status: DC
  Administered 2014-12-14 – 2014-12-15 (×2): 325 mg via ORAL
  Filled 2014-12-13 (×2): qty 1

## 2014-12-13 MED ORDER — LIDOCAINE HCL (CARDIAC) 20 MG/ML IV SOLN
INTRAVENOUS | Status: AC
  Filled 2014-12-13: qty 10

## 2014-12-13 MED ORDER — LACTATED RINGERS IV SOLN: INTRAVENOUS | Status: DC

## 2014-12-13 MED ORDER — ASPIRIN 81 MG PO CHEW: 324.0000 mg | CHEWABLE_TABLET | Freq: Every day | ORAL | Status: DC

## 2014-12-13 MED ORDER — INSULIN REGULAR HUMAN 100 UNIT/ML IJ SOLN
INTRAMUSCULAR | Status: DC
  Filled 2014-12-13 (×2): qty 2.5

## 2014-12-13 MED ORDER — SODIUM CHLORIDE 0.9 % IV SOLN: 250.0000 mL | INTRAVENOUS | Status: DC

## 2014-12-13 MED ORDER — MORPHINE SULFATE 2 MG/ML IJ SOLN
1.0000 mg | INTRAMUSCULAR | Status: AC | PRN
  Administered 2014-12-13: 2 mg via INTRAVENOUS
  Filled 2014-12-13: qty 1

## 2014-12-13 MED ORDER — MORPHINE SULFATE 2 MG/ML IJ SOLN
2.0000 mg | INTRAMUSCULAR | Status: DC | PRN
Start: 2014-12-13 — End: 2014-12-15
  Administered 2014-12-13 – 2014-12-14 (×4): 2 mg via INTRAVENOUS
  Filled 2014-12-13 (×4): qty 1

## 2014-12-13 MED ORDER — POTASSIUM CHLORIDE 10 MEQ/50ML IV SOLN
10.0000 meq | INTRAVENOUS | Status: DC
  Administered 2014-12-13: 10 meq via INTRAVENOUS

## 2014-12-13 MED ORDER — METOPROLOL TARTRATE 1 MG/ML IV SOLN: 2.5000 mg | INTRAVENOUS | Status: DC | PRN

## 2014-12-13 MED ORDER — INSULIN ASPART 100 UNIT/ML ~~LOC~~ SOLN
0.0000 [IU] | SUBCUTANEOUS | Status: DC
  Administered 2014-12-13 – 2014-12-14 (×3): 2 [IU] via SUBCUTANEOUS

## 2014-12-13 MED ORDER — FENTANYL CITRATE (PF) 250 MCG/5ML IJ SOLN
INTRAMUSCULAR | Status: AC
  Filled 2014-12-13: qty 5

## 2014-12-13 MED ORDER — ALBUMIN HUMAN 5 % IV SOLN: 250.0000 mL | INTRAVENOUS | Status: AC | PRN

## 2014-12-13 MED ORDER — DEXTROSE 5 % IV SOLN
1.5000 g | Freq: Two times a day (BID) | INTRAVENOUS | Status: AC
  Administered 2014-12-13 – 2014-12-15 (×4): 1.5 g via INTRAVENOUS
  Filled 2014-12-13 (×4): qty 1.5

## 2014-12-13 MED ORDER — FUROSEMIDE 10 MG/ML IJ SOLN
40.0000 mg | Freq: Once | INTRAMUSCULAR | Status: AC
  Administered 2014-12-13: 40 mg via INTRAVENOUS

## 2014-12-13 MED ORDER — LACTATED RINGERS IV SOLN: 500.0000 mL | Freq: Once | INTRAVENOUS | Status: AC | PRN

## 2014-12-13 MED ORDER — CHLORHEXIDINE GLUCONATE 4 % EX LIQD
CUTANEOUS | Status: AC
  Administered 2014-12-13: 05:00:00
  Filled 2014-12-13: qty 15

## 2014-12-13 MED ORDER — ALBUMIN HUMAN 5 % IV SOLN
INTRAVENOUS | Status: DC | PRN
  Administered 2014-12-13: 12:00:00 via INTRAVENOUS

## 2014-12-13 MED ORDER — ACETAMINOPHEN 160 MG/5ML PO SOLN: 650.0000 mg | Freq: Once | ORAL | Status: AC

## 2014-12-13 MED ORDER — DEXMEDETOMIDINE HCL IN NACL 200 MCG/50ML IV SOLN: 0.0000 ug/kg/h | INTRAVENOUS | Status: DC

## 2014-12-13 MED ORDER — VECURONIUM BROMIDE 10 MG IV SOLR
INTRAVENOUS | Status: DC | PRN
  Administered 2014-12-13 (×2): 5 mg via INTRAVENOUS

## 2014-12-13 MED ORDER — POTASSIUM CHLORIDE 10 MEQ/50ML IV SOLN
10.0000 meq | INTRAVENOUS | Status: AC
  Administered 2014-12-13 (×3): 10 meq via INTRAVENOUS

## 2014-12-13 MED ORDER — PROPOFOL 10 MG/ML IV BOLUS
INTRAVENOUS | Status: DC | PRN
  Administered 2014-12-13: 30 mg via INTRAVENOUS

## 2014-12-13 MED ORDER — 0.9 % SODIUM CHLORIDE (POUR BTL) OPTIME
TOPICAL | Status: DC | PRN
  Administered 2014-12-13: 2000 mL

## 2014-12-13 MED ORDER — ARTIFICIAL TEARS OP OINT
TOPICAL_OINTMENT | OPHTHALMIC | Status: DC | PRN
  Administered 2014-12-13: 1 via OPHTHALMIC

## 2014-12-13 MED ORDER — ROCURONIUM BROMIDE 100 MG/10ML IV SOLN
INTRAVENOUS | Status: DC | PRN
  Administered 2014-12-13: 50 mg via INTRAVENOUS

## 2014-12-13 MED ORDER — THROMBIN 20000 UNITS EX SOLR
OROMUCOSAL | Status: DC | PRN
  Administered 2014-12-13: 6 mL via TOPICAL

## 2014-12-13 MED ORDER — SODIUM CHLORIDE 0.9 % IJ SOLN
3.0000 mL | INTRAMUSCULAR | Status: DC | PRN
  Administered 2014-12-14 – 2014-12-15 (×2): 10 mL via INTRAVENOUS
  Filled 2014-12-13 (×2): qty 3

## 2014-12-13 MED ORDER — BISACODYL 10 MG RE SUPP: 10.0000 mg | Freq: Every day | RECTAL | Status: DC

## 2014-12-13 MED ORDER — MIDAZOLAM HCL 5 MG/5ML IJ SOLN
INTRAMUSCULAR | Status: DC | PRN
  Administered 2014-12-13: 4 mg via INTRAVENOUS
  Administered 2014-12-13: 3 mg via INTRAVENOUS
  Administered 2014-12-13: 2 mg via INTRAVENOUS
  Administered 2014-12-13: 1 mg via INTRAVENOUS

## 2014-12-13 MED ORDER — PHENYLEPHRINE HCL 10 MG/ML IJ SOLN
0.0000 ug/min | INTRAVENOUS | Status: DC
  Filled 2014-12-13: qty 2

## 2014-12-13 MED ORDER — PANTOPRAZOLE SODIUM 40 MG PO TBEC
40.0000 mg | DELAYED_RELEASE_TABLET | Freq: Every day | ORAL | Status: DC
  Administered 2014-12-15: 40 mg via ORAL
  Filled 2014-12-13: qty 1

## 2014-12-13 MED ORDER — FENTANYL CITRATE (PF) 100 MCG/2ML IJ SOLN
INTRAMUSCULAR | Status: DC | PRN
  Administered 2014-12-13: 250 ug via INTRAVENOUS
  Administered 2014-12-13: 150 ug via INTRAVENOUS
  Administered 2014-12-13: 50 ug via INTRAVENOUS
  Administered 2014-12-13: 100 ug via INTRAVENOUS
  Administered 2014-12-13: 450 ug via INTRAVENOUS
  Administered 2014-12-13: 100 ug via INTRAVENOUS
  Administered 2014-12-13: 50 ug via INTRAVENOUS
  Administered 2014-12-13: 100 ug via INTRAVENOUS
  Administered 2014-12-13: 250 ug via INTRAVENOUS

## 2014-12-13 MED ORDER — LACTATED RINGERS IV SOLN
INTRAVENOUS | Status: DC | PRN
  Administered 2014-12-13 (×2): via INTRAVENOUS

## 2014-12-13 MED ORDER — ACETAMINOPHEN 650 MG RE SUPP
650.0000 mg | Freq: Once | RECTAL | Status: AC
  Administered 2014-12-13: 650 mg via RECTAL

## 2014-12-13 MED ORDER — ACETAMINOPHEN 500 MG PO TABS
1000.0000 mg | ORAL_TABLET | Freq: Four times a day (QID) | ORAL | Status: DC
  Administered 2014-12-14 – 2014-12-15 (×5): 1000 mg via ORAL
  Filled 2014-12-13 (×8): qty 2

## 2014-12-13 MED ORDER — ACETAMINOPHEN 160 MG/5ML PO SOLN
1000.0000 mg | Freq: Four times a day (QID) | ORAL | Status: DC
  Filled 2014-12-13: qty 40

## 2014-12-13 MED ORDER — ARTIFICIAL TEARS OP OINT
TOPICAL_OINTMENT | OPHTHALMIC | Status: AC
  Filled 2014-12-13: qty 3.5

## 2014-12-13 MED ORDER — HEPARIN SODIUM (PORCINE) 1000 UNIT/ML IJ SOLN
INTRAMUSCULAR | Status: DC | PRN
  Administered 2014-12-13: 24000 [IU] via INTRAVENOUS

## 2014-12-13 MED ORDER — MAGNESIUM SULFATE 4 GM/100ML IV SOLN
4.0000 g | Freq: Once | INTRAVENOUS | Status: AC
  Administered 2014-12-13: 4 g via INTRAVENOUS
  Filled 2014-12-13: qty 100

## 2014-12-13 MED ORDER — SODIUM CHLORIDE 0.45 % IV SOLN
INTRAVENOUS | Status: DC | PRN
  Administered 2014-12-13: 20 mL/h via INTRAVENOUS

## 2014-12-13 MED ORDER — SODIUM CHLORIDE 0.9 % IJ SOLN
INTRAMUSCULAR | Status: AC
  Filled 2014-12-13: qty 10

## 2014-12-13 MED ORDER — METOPROLOL TARTRATE 12.5 MG HALF TABLET
12.5000 mg | ORAL_TABLET | Freq: Two times a day (BID) | ORAL | Status: DC
  Administered 2014-12-14 – 2014-12-15 (×3): 12.5 mg via ORAL
  Filled 2014-12-13 (×5): qty 1

## 2014-12-13 MED ORDER — FAMOTIDINE IN NACL 20-0.9 MG/50ML-% IV SOLN
20.0000 mg | Freq: Two times a day (BID) | INTRAVENOUS | Status: AC
  Administered 2014-12-13 (×2): 20 mg via INTRAVENOUS
  Filled 2014-12-13: qty 50

## 2014-12-13 MED ORDER — ONDANSETRON HCL 4 MG/2ML IJ SOLN: 4.0000 mg | Freq: Four times a day (QID) | INTRAMUSCULAR | Status: DC | PRN

## 2014-12-13 MED ORDER — OXYCODONE HCL 5 MG PO TABS
5.0000 mg | ORAL_TABLET | ORAL | Status: DC | PRN
  Administered 2014-12-14: 10 mg via ORAL
  Administered 2014-12-14 – 2014-12-15 (×3): 5 mg via ORAL
  Filled 2014-12-13: qty 1
  Filled 2014-12-13: qty 2
  Filled 2014-12-13 (×2): qty 1

## 2014-12-13 MED ORDER — PROTAMINE SULFATE 10 MG/ML IV SOLN
INTRAVENOUS | Status: AC
  Filled 2014-12-13: qty 25

## 2014-12-13 MED ORDER — EPHEDRINE SULFATE 50 MG/ML IJ SOLN
INTRAMUSCULAR | Status: DC | PRN
  Administered 2014-12-13: 5 mg via INTRAVENOUS

## 2014-12-13 MED ORDER — THROMBIN 20000 UNITS EX SOLR
CUTANEOUS | Status: AC
  Filled 2014-12-13: qty 20000

## 2014-12-13 MED ORDER — PROTAMINE SULFATE 10 MG/ML IV SOLN
INTRAVENOUS | Status: DC | PRN
  Administered 2014-12-13: 260 mg via INTRAVENOUS

## 2014-12-13 MED ORDER — SODIUM CHLORIDE 0.9 % IJ SOLN
3.0000 mL | Freq: Two times a day (BID) | INTRAMUSCULAR | Status: DC
  Administered 2014-12-14 – 2014-12-15 (×3): 10 mL via INTRAVENOUS

## 2014-12-13 MED ORDER — VANCOMYCIN HCL IN DEXTROSE 1-5 GM/200ML-% IV SOLN
1000.0000 mg | Freq: Once | INTRAVENOUS | Status: AC
  Administered 2014-12-13: 1000 mg via INTRAVENOUS
  Filled 2014-12-13: qty 200

## 2014-12-13 MED ORDER — BISACODYL 5 MG PO TBEC
10.0000 mg | DELAYED_RELEASE_TABLET | Freq: Every day | ORAL | Status: DC
Start: 2014-12-14 — End: 2014-12-15
  Administered 2014-12-14 – 2014-12-15 (×2): 10 mg via ORAL
  Filled 2014-12-13 (×2): qty 2

## 2014-12-13 MED ORDER — LIDOCAINE HCL (CARDIAC) 20 MG/ML IV SOLN
INTRAVENOUS | Status: DC | PRN
  Administered 2014-12-13: 100 mg via INTRAVENOUS

## 2014-12-13 MED ORDER — MIDAZOLAM HCL 10 MG/2ML IJ SOLN
INTRAMUSCULAR | Status: AC
  Filled 2014-12-13: qty 2

## 2014-12-13 MED ORDER — EPHEDRINE SULFATE 50 MG/ML IJ SOLN
INTRAMUSCULAR | Status: AC
  Filled 2014-12-13: qty 1

## 2014-12-13 MED ORDER — NITROGLYCERIN IN D5W 200-5 MCG/ML-% IV SOLN: 0.0000 ug/min | INTRAVENOUS | Status: DC

## 2014-12-13 MED ORDER — TRAMADOL HCL 50 MG PO TABS
50.0000 mg | ORAL_TABLET | ORAL | Status: DC | PRN
  Administered 2014-12-15: 50 mg via ORAL
  Filled 2014-12-13: qty 1

## 2014-12-13 MED ORDER — INSULIN REGULAR BOLUS VIA INFUSION
0.0000 [IU] | Freq: Three times a day (TID) | INTRAVENOUS | Status: DC
  Filled 2014-12-13: qty 10

## 2014-12-13 MED ORDER — METOPROLOL TARTRATE 25 MG/10 ML ORAL SUSPENSION
12.5000 mg | Freq: Two times a day (BID) | ORAL | Status: DC
  Filled 2014-12-13 (×5): qty 5

## 2014-12-13 MED ORDER — HEPARIN SODIUM (PORCINE) 1000 UNIT/ML IJ SOLN
INTRAMUSCULAR | Status: AC
  Filled 2014-12-13: qty 1

## 2014-12-13 MED ORDER — SODIUM CHLORIDE 0.9 % IJ SOLN
INTRAMUSCULAR | Status: AC
  Filled 2014-12-13: qty 20

## 2014-12-13 MED ORDER — ROCURONIUM BROMIDE 50 MG/5ML IV SOLN
INTRAVENOUS | Status: AC
  Filled 2014-12-13: qty 2

## 2014-12-13 MED ORDER — SODIUM CHLORIDE 0.9 % IV SOLN: INTRAVENOUS | Status: DC

## 2014-12-13 MED ORDER — DOCUSATE SODIUM 100 MG PO CAPS
200.0000 mg | ORAL_CAPSULE | Freq: Every day | ORAL | Status: DC
  Administered 2014-12-14 – 2014-12-15 (×2): 200 mg via ORAL
  Filled 2014-12-13 (×2): qty 2

## 2014-12-13 MED ORDER — PROPOFOL 10 MG/ML IV BOLUS
INTRAVENOUS | Status: AC
  Filled 2014-12-13: qty 20

## 2014-12-13 MED ORDER — MIDAZOLAM HCL 2 MG/2ML IJ SOLN: 2.0000 mg | INTRAMUSCULAR | Status: DC | PRN

## 2014-12-13 MED ORDER — VECURONIUM BROMIDE 10 MG IV SOLR
INTRAVENOUS | Status: AC
  Filled 2014-12-13: qty 10

## 2014-12-13 MED FILL — Lidocaine HCl IV Inj 20 MG/ML: INTRAVENOUS | Qty: 5 | Status: AC

## 2014-12-13 MED FILL — Mannitol IV Soln 20%: INTRAVENOUS | Qty: 500 | Status: AC

## 2014-12-13 MED FILL — Electrolyte-R (PH 7.4) Solution: INTRAVENOUS | Qty: 3000 | Status: AC

## 2014-12-13 MED FILL — Heparin Sodium (Porcine) Inj 1000 Unit/ML: INTRAMUSCULAR | Qty: 20 | Status: AC

## 2014-12-13 MED FILL — Heparin Sodium (Porcine) Inj 1000 Unit/ML: INTRAMUSCULAR | Qty: 30 | Status: AC

## 2014-12-13 MED FILL — Sodium Bicarbonate IV Soln 8.4%: INTRAVENOUS | Qty: 50 | Status: AC

## 2014-12-13 MED FILL — Sodium Chloride IV Soln 0.9%: INTRAVENOUS | Qty: 2000 | Status: AC

## 2014-12-13 SURGICAL SUPPLY — 98 items
BAG DECANTER FOR FLEXI CONT (MISCELLANEOUS) ×3 IMPLANT
BANDAGE ELASTIC 4 VELCRO ST LF (GAUZE/BANDAGES/DRESSINGS) ×3 IMPLANT
BANDAGE ELASTIC 6 VELCRO ST LF (GAUZE/BANDAGES/DRESSINGS) ×3 IMPLANT
BASKET HEART  (ORDER IN 25'S) (MISCELLANEOUS) ×1
BASKET HEART (ORDER IN 25'S) (MISCELLANEOUS) ×1
BASKET HEART (ORDER IN 25S) (MISCELLANEOUS) ×1 IMPLANT
BLADE STERNUM SYSTEM 6 (BLADE) ×3 IMPLANT
BNDG GAUZE ELAST 4 BULKY (GAUZE/BANDAGES/DRESSINGS) ×3 IMPLANT
CANISTER SUCTION 2500CC (MISCELLANEOUS) ×3 IMPLANT
CATH ROBINSON RED A/P 18FR (CATHETERS) ×6 IMPLANT
CATH THORACIC 28FR (CATHETERS) ×3 IMPLANT
CATH THORACIC 36FR (CATHETERS) ×3 IMPLANT
CATH THORACIC 36FR RT ANG (CATHETERS) ×3 IMPLANT
CLIP TI MEDIUM 24 (CLIP) IMPLANT
CLIP TI WIDE RED SMALL 24 (CLIP) ×4 IMPLANT
COVER SURGICAL LIGHT HANDLE (MISCELLANEOUS) ×3 IMPLANT
CRADLE DONUT ADULT HEAD (MISCELLANEOUS) ×3 IMPLANT
DRAPE CARDIOVASCULAR INCISE (DRAPES) ×3
DRAPE SLUSH/WARMER DISC (DRAPES) ×3 IMPLANT
DRAPE SRG 135X102X78XABS (DRAPES) ×1 IMPLANT
DRSG COVADERM 4X14 (GAUZE/BANDAGES/DRESSINGS) ×3 IMPLANT
ELECT CAUTERY BLADE 6.4 (BLADE) ×3 IMPLANT
ELECT REM PT RETURN 9FT ADLT (ELECTROSURGICAL) ×6
ELECTRODE REM PT RTRN 9FT ADLT (ELECTROSURGICAL) ×2 IMPLANT
GAUZE SPONGE 4X4 12PLY STRL (GAUZE/BANDAGES/DRESSINGS) ×6 IMPLANT
GLOVE BIO SURGEON STRL SZ 6 (GLOVE) IMPLANT
GLOVE BIO SURGEON STRL SZ 6.5 (GLOVE) ×6 IMPLANT
GLOVE BIO SURGEON STRL SZ7 (GLOVE) IMPLANT
GLOVE BIO SURGEON STRL SZ7.5 (GLOVE) IMPLANT
GLOVE BIO SURGEONS STRL SZ 6.5 (GLOVE) ×6
GLOVE BIOGEL PI IND STRL 6 (GLOVE) IMPLANT
GLOVE BIOGEL PI IND STRL 6.5 (GLOVE) IMPLANT
GLOVE BIOGEL PI IND STRL 7.0 (GLOVE) IMPLANT
GLOVE BIOGEL PI INDICATOR 6 (GLOVE)
GLOVE BIOGEL PI INDICATOR 6.5 (GLOVE)
GLOVE BIOGEL PI INDICATOR 7.0 (GLOVE) ×2
GLOVE EUDERMIC 7 POWDERFREE (GLOVE) ×6 IMPLANT
GLOVE ORTHO TXT STRL SZ7.5 (GLOVE) IMPLANT
GOWN STRL REUS W/ TWL LRG LVL3 (GOWN DISPOSABLE) ×4 IMPLANT
GOWN STRL REUS W/ TWL XL LVL3 (GOWN DISPOSABLE) ×1 IMPLANT
GOWN STRL REUS W/TWL LRG LVL3 (GOWN DISPOSABLE) ×15
GOWN STRL REUS W/TWL XL LVL3 (GOWN DISPOSABLE) ×3
HEMOSTAT POWDER SURGIFOAM 1G (HEMOSTASIS) ×9 IMPLANT
HEMOSTAT SURGICEL 2X14 (HEMOSTASIS) ×3 IMPLANT
INSERT FOGARTY 61MM (MISCELLANEOUS) IMPLANT
INSERT FOGARTY XLG (MISCELLANEOUS) IMPLANT
KIT BASIN OR (CUSTOM PROCEDURE TRAY) ×3 IMPLANT
KIT CATH CPB BARTLE (MISCELLANEOUS) ×3 IMPLANT
KIT ROOM TURNOVER OR (KITS) ×3 IMPLANT
KIT SUCTION CATH 14FR (SUCTIONS) ×3 IMPLANT
KIT VASOVIEW W/TROCAR VH 2000 (KITS) ×3 IMPLANT
LIQUID BAND (GAUZE/BANDAGES/DRESSINGS) ×2 IMPLANT
NS IRRIG 1000ML POUR BTL (IV SOLUTION) ×15 IMPLANT
PACK OPEN HEART (CUSTOM PROCEDURE TRAY) ×3 IMPLANT
PAD ARMBOARD 7.5X6 YLW CONV (MISCELLANEOUS) ×6 IMPLANT
PAD ELECT DEFIB RADIOL ZOLL (MISCELLANEOUS) ×3 IMPLANT
PENCIL BUTTON HOLSTER BLD 10FT (ELECTRODE) ×3 IMPLANT
PUNCH AORTIC ROT 4.0MM RCL 40 (MISCELLANEOUS) ×2 IMPLANT
PUNCH AORTIC ROTATE 4.0MM (MISCELLANEOUS) IMPLANT
PUNCH AORTIC ROTATE 4.5MM 8IN (MISCELLANEOUS) ×3 IMPLANT
PUNCH AORTIC ROTATE 5MM 8IN (MISCELLANEOUS) IMPLANT
SPONGE GAUZE 4X4 12PLY STER LF (GAUZE/BANDAGES/DRESSINGS) ×2 IMPLANT
SPONGE INTESTINAL PEANUT (DISPOSABLE) IMPLANT
SPONGE LAP 18X18 X RAY DECT (DISPOSABLE) IMPLANT
SPONGE LAP 4X18 X RAY DECT (DISPOSABLE) ×1 IMPLANT
SUT BONE WAX W31G (SUTURE) ×3 IMPLANT
SUT MNCRL AB 4-0 PS2 18 (SUTURE) ×2 IMPLANT
SUT PROLENE 3 0 SH DA (SUTURE) IMPLANT
SUT PROLENE 3 0 SH1 36 (SUTURE) ×3 IMPLANT
SUT PROLENE 4 0 RB 1 (SUTURE)
SUT PROLENE 4 0 SH DA (SUTURE) IMPLANT
SUT PROLENE 4-0 RB1 .5 CRCL 36 (SUTURE) IMPLANT
SUT PROLENE 5 0 C 1 36 (SUTURE) IMPLANT
SUT PROLENE 6 0 C 1 30 (SUTURE) ×4 IMPLANT
SUT PROLENE 7 0 BV 1 (SUTURE) ×4 IMPLANT
SUT PROLENE 7 0 BV1 MDA (SUTURE) ×3 IMPLANT
SUT PROLENE 8 0 BV175 6 (SUTURE) IMPLANT
SUT SILK  1 MH (SUTURE)
SUT SILK 1 MH (SUTURE) IMPLANT
SUT STEEL STERNAL CCS#1 18IN (SUTURE) ×2 IMPLANT
SUT STEEL SZ 6 DBL 3X14 BALL (SUTURE) ×4 IMPLANT
SUT VIC AB 1 CTX 36 (SUTURE) ×6
SUT VIC AB 1 CTX36XBRD ANBCTR (SUTURE) ×2 IMPLANT
SUT VIC AB 2-0 CT1 27 (SUTURE)
SUT VIC AB 2-0 CT1 TAPERPNT 27 (SUTURE) IMPLANT
SUT VIC AB 2-0 CTX 27 (SUTURE) ×4 IMPLANT
SUT VIC AB 3-0 SH 27 (SUTURE) ×3
SUT VIC AB 3-0 SH 27X BRD (SUTURE) IMPLANT
SUT VIC AB 3-0 X1 27 (SUTURE) ×2 IMPLANT
SUT VICRYL 4-0 PS2 18IN ABS (SUTURE) IMPLANT
SUTURE E-PAK OPEN HEART (SUTURE) ×3 IMPLANT
SYSTEM SAHARA CHEST DRAIN ATS (WOUND CARE) ×3 IMPLANT
TOWEL OR 17X24 6PK STRL BLUE (TOWEL DISPOSABLE) ×3 IMPLANT
TOWEL OR 17X26 10 PK STRL BLUE (TOWEL DISPOSABLE) ×3 IMPLANT
TRAY FOLEY IC TEMP SENS 16FR (CATHETERS) ×3 IMPLANT
TUBING INSUFFLATION (TUBING) ×3 IMPLANT
UNDERPAD 30X30 INCONTINENT (UNDERPADS AND DIAPERS) ×3 IMPLANT
WATER STERILE IRR 1000ML POUR (IV SOLUTION) ×6 IMPLANT

## 2014-12-13 NOTE — Anesthesia Preprocedure Evaluation (Addendum)
Anesthesia Evaluation  Patient identified by MRN, date of birth, ID band Patient awake    Reviewed: Allergy & Precautions, NPO status , Patient's Chart, lab work & pertinent test results  History of Anesthesia Complications (+) PONV  Airway Mallampati: III  TM Distance: >3 FB Neck ROM: Full    Dental  (+) Edentulous Upper, Edentulous Lower   Pulmonary COPDformer smoker,  breath sounds clear to auscultation        Cardiovascular hypertension, + angina + CAD, + Past MI and + Peripheral Vascular Disease (Infrarenal  AAA) Rhythm:Regular Rate:Normal     Neuro/Psych Anxiety CVA    GI/Hepatic Neg liver ROS, GERD-  ,  Endo/Other  negative endocrine ROS  Renal/GU negative Renal ROS     Musculoskeletal  (+) Arthritis -,   Abdominal   Peds  Hematology  (+) anemia ,   Anesthesia Other Findings   Reproductive/Obstetrics                           Anesthesia Physical Anesthesia Plan  ASA: IV  Anesthesia Plan: General   Post-op Pain Management:    Induction: Intravenous  Airway Management Planned: Oral ETT  Additional Equipment: Arterial line, TEE, CVP, PA Cath and Ultrasound Guidance Line Placement  Intra-op Plan:   Post-operative Plan: Post-operative intubation/ventilation  Informed Consent: I have reviewed the patients History and Physical, chart, labs and discussed the procedure including the risks, benefits and alternatives for the proposed anesthesia with the patient or authorized representative who has indicated his/her understanding and acceptance.   Dental advisory given  Plan Discussed with: CRNA  Anesthesia Plan Comments:         Anesthesia Quick Evaluation

## 2014-12-13 NOTE — Op Note (Signed)
CARDIOVASCULAR SURGERY OPERATIVE NOTE  12/13/2014  Surgeon:  Gaye Pollack, MD  First Assistant: Jadene Pierini,  PA-C   Preoperative Diagnosis:  Severe multi-vessel coronary artery disease   Postoperative Diagnosis:  Same   Procedure:  1. Median Sternotomy 2. Extracorporeal circulation 3.   Coronary artery bypass grafting x 5   Left internal mammary graft to the LAD  SVG to diagonal  SVG to Ramus  SVG to OM  SVG to PDA 4.   Endoscopic vein harvest from the right leg   Anesthesia:  General Endotracheal   Clinical History/Surgical Indication:  The patient is a 64 year old vasculopath with hypertension, hyperlipidemia and coronary disease with known prior RCA occlusion that has been managed medically. He presented on 12/07/2014 with a several month history of exertional angina with a one day history of stuttering angina that did not respond to NTG and he ruled in for a NSTEMI with a troponin of 2.65 increasing to 7.83. Cath showed severe LM equivalent disease involving the trifurcation into the LAD, Ramus, and LCX with a 99% proximal Ramus lesion suspected to be the culprit. There is chronic occlusion of the RCA with left to right collaterals. The LVEF is 45%. He has severe calcification of the aortic arch, descending thoracic and abdomino-iliac arteries with a 5 cm x 5.7 cm complex AAA by CT in 07/2013 followed by Dr. Sammuel Hines at [facility]. He was supposed to have a follow up scan a few months ago but developed a staph infection in the left foot that took several months to clear up. He was scheduled to return to Dr. Sammuel Hines for reevaluation later this week. He has had intermittent CP overnight and is now on heparin and NTG.  He has severe multivessel coronary disease with unstable angina on heparin and NTG drips following NSTEMI. I agree that CABG is the best treatment for him. He has diffuse  vascular disease as noted above but the ascending aorta appears to be adequate for cannulation with no significant calcification. I discussed the operative procedure with the patient and family including alternatives, benefits and risks; including but not limited to bleeding, blood transfusion, infection, stroke, myocardial infarction, graft failure, heart block requiring a permanent pacemaker, organ dysfunction, and death. [name] understands and agrees to proceed.   Preparation:  The patient was seen in the preoperative holding area and the correct patient, correct operation were confirmed with the patient after reviewing the medical record and catheterization. The consent was signed by me. Preoperative antibiotics were given. A pulmonary arterial line and radial arterial line were placed by the anesthesia team. The patient was taken back to the operating room and positioned supine on the operating room table. After being placed under general endotracheal anesthesia by the anesthesia team a foley catheter was placed. The neck, chest, abdomen, and both legs were prepped with betadine soap and solution and draped in the usual sterile manner. A surgical time-out was taken and the correct patient and operative procedure were confirmed with the nursing and anesthesia staff.   Cardiopulmonary Bypass:  A median sternotomy was performed. The pericardium was opened in the midline. Right ventricular function appeared normal. The ascending aorta was of normal size and had no palpable plaque. There were no contraindications to aortic cannulation or cross-clamping. The patient was fully systemically heparinized and the ACT was maintained > 400 sec. The proximal aortic arch was cannulated with a 20 F aortic cannula for arterial inflow. Venous cannulation was performed via the  right atrial appendage using a two-staged venous cannula. An antegrade cardioplegia/vent cannula was inserted into the mid-ascending  aorta. Aortic occlusion was performed with a single cross-clamp. Systemic cooling to 32 degrees Centigrade and topical cooling of the heart with iced saline were used. Hyperkalemic antegrade cold blood cardioplegia was used to induce diastolic arrest and was then given at about 20 minute intervals throughout the period of arrest to maintain myocardial temperature at or below 10 degrees centigrade. A temperature probe was inserted into the interventricular septum and an insulating pad was placed in the pericardium.   Left internal mammary harvest:  The left side of the sternum was retracted using the Rultract retractor. The left internal mammary artery was harvested as a pedicle graft. All side branches were clipped. It was a medium-sized vessel of good quality with excellent blood flow. It was ligated distally and divided. It was sprayed with topical papaverine solution to prevent vasospasm.   Endoscopic vein harvest:  The right greater saphenous vein was harvested endoscopically through a 2 cm incision medial to the right knee. It was harvested from the upper thigh to below the knee. It was a medium-sized vein of good quality. The side branches were all ligated with 4-0 silk ties.    Coronary arteries:  The coronary arteries were examined.   LAD:  Intramyocardial throughout the proximal and mid portions. The distal vessel is graftable with no significant disease. The diagonal is a moderate sized vessel with calcific plaque present proximally but graftable distally.   LCX:  Ramus is large, diffusely diseased in the proximal and mid portions then becomes intramyocardial. I traced this into the muscle where it was less diseased and graftable. The OM is a moderate sized vessel with mild distal disease  RCA:  The main portion of the RCA is diffusely diseased with calcific plaque. The PDA is graftable proximally but small. The PL is tiny.    Grafts:  1. LIMA to the LAD: 1.6 mm distally. It was  sewn end to side using 8-0 prolene continuous suture. 2. SVG to diagonal:  1.6 mm. It was sewn end to side using 7-0 prolene continuous suture. 3. SVG to Ramus:  1.75 mm. It was sewn end to side using 7-0 prolene continuous suture. 4. SVG to OM:  1.6 mm. It was sewn end to side using 7-0 prolene continuous suture. 5. SVG to PDA: 1.5 mm. It was sewn end to side using 7-0 prolene continuous suture.  The proximal vein graft anastomoses were performed to the mid-ascending aorta using continuous 6-0 prolene suture. Graft markers were placed around the proximal anastomoses.   Completion:  The patient was rewarmed to 37 degrees Centigrade. The clamp was removed from the LIMA pedicle and there was rapid warming of the septum and return of ventricular fibrillation. The crossclamp was removed with a time of 99 minutes. There was spontaneous return of sinus rhythm. The distal and proximal anastomoses were checked for hemostasis. The position of the grafts was satisfactory. Two temporary epicardial pacing wires were placed on the right atrium and two on the right ventricle. The patient was weaned from CPB without difficulty on no inotropes. CPB time was 118 minutes. Cardiac output was 6.5 LPM. Heparin was fully reversed with protamine and the aortic and venous cannulas removed. Hemostasis was achieved. Mediastinal and left pleural drainage tubes were placed. The sternum was closed with double #6 stainless steel wires. The fascia was closed with continuous # 1 vicryl suture. The subcutaneous tissue was  closed with 2-0 vicryl continuous suture. The skin was closed with 3-0 vicryl subcuticular suture. All sponge, needle, and instrument counts were reported correct at the end of the case. Dry sterile dressings were placed over the incisions and around the chest tubes which were connected to pleurevac suction. The patient was then transported to the surgical intensive care unit in critical but stable condition.

## 2014-12-13 NOTE — Anesthesia Procedure Notes (Signed)
Procedure Name: Intubation Date/Time: 12/13/2014 7:50 AM Performed by: Gaylene Brooks Pre-anesthesia Checklist: Patient identified, Timeout performed, Emergency Drugs available, Suction available and Patient being monitored Patient Re-evaluated:Patient Re-evaluated prior to inductionOxygen Delivery Method: Circle system utilized Preoxygenation: Pre-oxygenation with 100% oxygen Intubation Type: IV induction Ventilation: Mask ventilation without difficulty and Oral airway inserted - appropriate to patient size Laryngoscope Size: Sabra Heck and 2 Grade View: Grade I Tube type: Oral Tube size: 8.0 mm Number of attempts: 1 Airway Equipment and Method: Stylet Placement Confirmation: ETT inserted through vocal cords under direct vision,  breath sounds checked- equal and bilateral,  positive ETCO2 and CO2 detector Secured at: 23 cm Tube secured with: Tape Dental Injury: Teeth and Oropharynx as per pre-operative assessment

## 2014-12-13 NOTE — Progress Notes (Signed)
Pt. Read educational book on his CABG but did not want to watch the video.

## 2014-12-13 NOTE — Procedures (Signed)
Extubation Procedure Note  Patient Details:   Name: [name] DOB: 04/03/1951 MRN: [id number]   Airway Documentation:  Airway 8 mm (Active)  Secured at (cm) 23 cm 12/13/2014  4:10 PM  Measured From Lips 12/13/2014  4:10 PM  Secured Location Right 12/13/2014  4:10 PM  Secured By Pink Tape 12/13/2014  4:10 PM  Site Condition Dry 12/13/2014  4:10 PM    Evaluation  O2 sats: stable throughout Complications: No apparent complications Patient did tolerate procedure well. Bilateral Breath Sounds: Clear, Diminished   Yes. Extubated patient to 4Lnasal canula. Patient oriented to time and place.  Dimple Nanas 12/13/2014, 5:16 PM

## 2014-12-13 NOTE — Progress Notes (Signed)
  Echocardiogram Echocardiogram Transesophageal has been performed.  [name] 12/13/2014, 8:52 AM

## 2014-12-13 NOTE — Anesthesia Postprocedure Evaluation (Signed)
  Anesthesia Post-op Note  Patient: [name]  Procedure(s) Performed: Procedure(s): CORONARY ARTERY BYPASS GRAFT times five using left internal mammary artery and endoscopic right leg saphenous vein harvest (N/A) INTRAOPERATIVE TRANSESOPHAGEAL ECHOCARDIOGRAM (N/A)  Patient Location: ICU  Anesthesia Type:General  Level of Consciousness: Patient remains intubated per anesthesia plan  Airway and Oxygen Therapy: Patient remains intubated per anesthesia plan  Post-op Pain: none  Post-op Assessment: Post-op Vital signs reviewed              Post-op Vital Signs: Reviewed  Last Vitals:  Filed Vitals:   12/13/14 1345  BP:   Pulse: 90  Temp: 35.9 C  Resp: 21    Complications: No apparent anesthesia complications

## 2014-12-13 NOTE — Progress Notes (Signed)
Patient ID: [name], male   DOB: 17-Apr-1951, 64 y.o.   MRN: [id number]  SICU Evening Rounds:   Hemodynamically stable  CI = 2.0  Extubated and alert  Urine output good  CT output low  CBC    Component Value Date/Time   WBC 13.7* 12/13/2014 1900   RBC 2.93* 12/13/2014 1900   HGB 10.2* 12/13/2014 1904   HCT 30.0* 12/13/2014 1904   PLT 141* 12/13/2014 1900   MCV 99.7 12/13/2014 1900   MCH 35.2* 12/13/2014 1900   MCHC 35.3 12/13/2014 1900   RDW 12.9 12/13/2014 1900   LYMPHSABS 0.9 12/07/2014 1028   MONOABS 1.0 12/07/2014 1028   EOSABS 0.2 12/07/2014 1028   BASOSABS 0.0 12/07/2014 1028     BMET    Component Value Date/Time   NA 133* 12/13/2014 1904   K 4.3 12/13/2014 1904   CL 99* 12/13/2014 1904   CO2 24 12/13/2014 0246   GLUCOSE 123* 12/13/2014 1904   BUN 7 12/13/2014 1904   CREATININE 0.90 12/13/2014 1904   CALCIUM 8.8* 12/13/2014 0246   GFRNONAA >60 12/13/2014 0246   GFRAA >60 12/13/2014 0246     A/P:  Stable postop course. Continue current plans

## 2014-12-13 NOTE — Transfer of Care (Signed)
Immediate Anesthesia Transfer of Care Note  Patient: [name]  Procedure(s) Performed: Procedure(s): CORONARY ARTERY BYPASS GRAFT times five using left internal mammary artery and endoscopic right leg saphenous vein harvest (N/A) INTRAOPERATIVE TRANSESOPHAGEAL ECHOCARDIOGRAM (N/A)  Patient Location: SICU  Anesthesia Type:General  Level of Consciousness: sedated, unresponsive and Patient remains intubated per anesthesia plan  Airway & Oxygen Therapy: Patient remains intubated per anesthesia plan and Patient placed on Ventilator (see vital sign flow sheet for setting)  Post-op Assessment: Report given to RN and Post -op Vital signs reviewed and stable  Post vital signs: Reviewed and stable  Last Vitals:  Filed Vitals:   12/12/14 2000  BP: 131/65  Pulse: 62  Temp: 36.4 C  Resp:     Complications: No apparent anesthesia complications

## 2014-12-13 NOTE — Brief Op Note (Signed)
12/07/2014 - 12/13/2014  11:12 AM  PATIENT:  [name]  64 y.o. male  PRE-OPERATIVE DIAGNOSIS:  1. S/p NSTEMI 2.CAD  POST-OPERATIVE DIAGNOSIS:  1. S/p NSTEMI 2.CAD  PROCEDURE: INTRAOPERATIVE TRANSESOPHAGEAL ECHOCARDIOGRAM. MEDIAN STERNOTOMY forCORONARY ARTERY BYPASS GRAFT x 5 (LIMA to LAD, SVG to DIAGONAL, SVG to RAMUS INTERMEDIATE, SVG to OM, and SVG to PDA) using left internal mammary artery and endoscopic right leg saphenous vein harvest    SURGEON:  Surgeon(s) and Role:    * Gaye Pollack, MD - Primary  PHYSICIAN ASSISTANT: Lars Pinks PA-C  ANESTHESIA:   general  EBL:  Total I/O In: 1000 [I.V.:1000] Out: 300 [Urine:300]  DRAINS: Chest tubes placed in the mediastinal and pleural spaces   COUNTS CORRECT:  YES  DICTATION: .Dragon Dictation  PLAN OF CARE: Admit to inpatient   PATIENT DISPOSITION:  ICU - intubated and hemodynamically stable.   Delay start of Pharmacological VTE agent (>24hrs) due to surgical blood loss or risk of bleeding: yes  BASELINE WEIGHT: 89 kg

## 2014-12-14 ENCOUNTER — Inpatient Hospital Stay (HOSPITAL_COMMUNITY): Payer: Medicare Other

## 2014-12-14 LAB — BASIC METABOLIC PANEL
ANION GAP: 9 (ref 5–15)
BUN: 7 mg/dL (ref 6–20)
CHLORIDE: 99 mmol/L — AB (ref 101–111)
CO2: 23 mmol/L (ref 22–32)
Calcium: 7.9 mg/dL — ABNORMAL LOW (ref 8.9–10.3)
Creatinine, Ser: 1.15 mg/dL (ref 0.61–1.24)
GFR calc non Af Amer: >60 mL/min (ref >60)
Glucose, Bld: 119 mg/dL — ABNORMAL HIGH (ref 65–99)
Potassium: 3.8 mmol/L (ref 3.5–5.1)
Sodium: 131 mmol/L — ABNORMAL LOW (ref 135–145)

## 2014-12-14 LAB — POCT I-STAT, CHEM 8
BUN: 12 mg/dL (ref 6–20)
CALCIUM ION: 1.21 mmol/L (ref 1.13–1.30)
CREATININE: 1.2 mg/dL (ref 0.61–1.24)
Chloride: 96 mmol/L — ABNORMAL LOW (ref 101–111)
Glucose, Bld: 106 mg/dL — ABNORMAL HIGH (ref 65–99)
HCT: 29 % — ABNORMAL LOW (ref 39.0–52.0)
Hemoglobin: 9.9 g/dL — ABNORMAL LOW (ref 13.0–17.0)
Potassium: 4.1 mmol/L (ref 3.5–5.1)
Sodium: 132 mmol/L — ABNORMAL LOW (ref 135–145)
TCO2: 23 mmol/L (ref 0–100)

## 2014-12-14 LAB — MAGNESIUM
MAGNESIUM: 1.9 mg/dL (ref 1.7–2.4)
Magnesium: 1.7 mg/dL (ref 1.7–2.4)

## 2014-12-14 LAB — GLUCOSE, CAPILLARY
GLUCOSE-CAPILLARY: 97 mg/dL (ref 65–99)
GLUCOSE-CAPILLARY: 121 mg/dL — AB (ref 65–99)
GLUCOSE-CAPILLARY: 125 mg/dL — AB (ref 65–99)
GLUCOSE-CAPILLARY: 112 mg/dL — AB (ref 65–99)
Glucose-Capillary: 116 mg/dL — ABNORMAL HIGH (ref 65–99)
Glucose-Capillary: 117 mg/dL — ABNORMAL HIGH (ref 65–99)
Glucose-Capillary: 144 mg/dL — ABNORMAL HIGH (ref 65–99)
Glucose-Capillary: 119 mg/dL — ABNORMAL HIGH (ref 65–99)
Glucose-Capillary: 100 mg/dL — ABNORMAL HIGH (ref 65–99)

## 2014-12-14 LAB — CBC
HCT: 26.8 % — ABNORMAL LOW (ref 39.0–52.0)
HEMATOCRIT: 26.9 % — AB (ref 39.0–52.0)
Hemoglobin: 9.3 g/dL — ABNORMAL LOW (ref 13.0–17.0)
Hemoglobin: 9.4 g/dL — ABNORMAL LOW (ref 13.0–17.0)
MCH: 34.7 pg — AB (ref 26.0–34.0)
MCH: 35.6 pg — ABNORMAL HIGH (ref 26.0–34.0)
MCHC: 34.6 g/dL (ref 30.0–36.0)
MCHC: 35.1 g/dL (ref 30.0–36.0)
MCV: 100.4 fL — ABNORMAL HIGH (ref 78.0–100.0)
MCV: 101.5 fL — AB (ref 78.0–100.0)
PLATELETS: 152 10*3/uL (ref 150–400)
Platelets: 143 10*3/uL — ABNORMAL LOW (ref 150–400)
RBC: 2.68 MIL/uL — ABNORMAL LOW (ref 4.22–5.81)
RBC: 2.64 MIL/uL — ABNORMAL LOW (ref 4.22–5.81)
RDW: 13.1 % (ref 11.5–15.5)
RDW: 13.2 % (ref 11.5–15.5)
WBC: 13 10*3/uL — AB (ref 4.0–10.5)
WBC: 16.2 10*3/uL — ABNORMAL HIGH (ref 4.0–10.5)

## 2014-12-14 LAB — CREATININE, SERUM
Creatinine, Ser: 1.3 mg/dL — ABNORMAL HIGH (ref 0.61–1.24)
GFR, EST NON AFRICAN AMERICAN: 56 mL/min — AB (ref >60)

## 2014-12-14 MED ORDER — POTASSIUM CHLORIDE CRYS ER 20 MEQ PO TBCR
20.0000 meq | EXTENDED_RELEASE_TABLET | Freq: Two times a day (BID) | ORAL | Status: AC
  Administered 2014-12-14 (×2): 20 meq via ORAL
  Filled 2014-12-14 (×2): qty 1

## 2014-12-14 MED ORDER — INSULIN ASPART 100 UNIT/ML ~~LOC~~ SOLN: 0.0000 [IU] | SUBCUTANEOUS | Status: DC

## 2014-12-14 MED ORDER — ENOXAPARIN SODIUM 40 MG/0.4ML ~~LOC~~ SOLN
40.0000 mg | Freq: Every day | SUBCUTANEOUS | Status: DC
  Administered 2014-12-14 – 2014-12-17 (×4): 40 mg via SUBCUTANEOUS
  Filled 2014-12-14 (×6): qty 0.4

## 2014-12-14 MED ORDER — FUROSEMIDE 10 MG/ML IJ SOLN
40.0000 mg | Freq: Two times a day (BID) | INTRAMUSCULAR | Status: AC
  Administered 2014-12-14 (×2): 40 mg via INTRAVENOUS

## 2014-12-14 NOTE — Plan of Care (Signed)
Problem: Phase II - Intermediate Post-Op Goal: Advance Diet Outcome: Not Progressing Pt does not want to advance diet at this time

## 2014-12-14 NOTE — Progress Notes (Signed)
1 Day Post-Op Procedure(s) (LRB): CORONARY ARTERY BYPASS GRAFT times five using left internal mammary artery and endoscopic right leg saphenous vein harvest (N/A) INTRAOPERATIVE TRANSESOPHAGEAL ECHOCARDIOGRAM (N/A) Subjective:  No complaints  Objective: Vital signs in last 24 hours: Temp:  [96.4 F (35.8 C)-99.9 F (37.7 C)] 99.9 F (37.7 C) (07/23 0700) Pulse Rate:  [76-101] 85 (07/23 0700) Cardiac Rhythm:  [-] Normal sinus rhythm (07/22 2000) Resp:  [9-31] 22 (07/23 0700) BP: (96-141)/(53-84) 121/67 mmHg (07/23 0700) SpO2:  [92 %-99 %] 97 % (07/23 0700) Arterial Line BP: (95-154)/(47-71) 128/57 mmHg (07/23 0700) FiO2 (%):  [40 %-50 %] 40 % (07/22 1610) Weight:  [91 kg (200 lb 9.9 oz)] 91 kg (200 lb 9.9 oz) (07/23 0600)  Hemodynamic parameters for last 24 hours: PAP: (19-38)/(9-22) 28/12 mmHg CO:  [3.9 L/min-6 L/min] 5.1 L/min CI:  [1.9 L/min/m2-2.9 L/min/m2] 2.5 L/min/m2  Intake/Output from previous day: 07/22 0701 - 07/23 0700 In: 5177.7 [P.O.:240; I.V.:4120.7; Blood:287; NG/GT:30; IV Piggyback:500] Out: [id number]; Chest Tube:675] Intake/Output this shift:    General appearance: alert and cooperative Neurologic: intact Heart: regular rate and rhythm, S1, S2 normal, no murmur, click, rub or gallop Lungs: clear to auscultation bilaterally Extremities: edema mild Wound: dressing dry  Lab Results:  Recent Labs  12/13/14 1900 12/13/14 1904 12/14/14 0430  WBC 13.7*  --  13.0*  HGB 10.3* 10.2* 9.3*  HCT 29.2* 30.0* 26.9*  PLT 141*  --  143*   BMET:  Recent Labs  12/13/14 0246  12/13/14 1904 12/14/14 0430  NA 130*  < > 133* 131*  K 3.9  < > 4.3 3.8  CL 97*  < > 99* 99*  CO2 24  --   --  23  GLUCOSE 104*  < > 123* 119*  BUN 9  < > 7 7  CREATININE 1.15  < > 0.90 1.15  CALCIUM 8.8*  --   --  7.9*  < > = values in this interval not displayed.  PT/INR:  Recent Labs  12/13/14 1313  LABPROT 17.9*  INR 1.46   ABG    Component Value Date/Time   PHART 7.410 12/13/2014 1816   HCO3 21.9 12/13/2014 1816   TCO2 22 12/13/2014 1904   ACIDBASEDEF 2.0 12/13/2014 1816   O2SAT 94.0 12/13/2014 1816   CBG (last 3)   Recent Labs  12/13/14 2150 12/13/14 2351 12/14/14 0426  GLUCAP 97 144* 119*    Assessment/Plan: S/P Procedure(s) (LRB): CORONARY ARTERY BYPASS GRAFT times five using left internal mammary artery and endoscopic right leg saphenous vein harvest (N/A) INTRAOPERATIVE TRANSESOPHAGEAL ECHOCARDIOGRAM (N/A) Mobilize Diuresis. Wt is 7 lbs over preop Diabetes control: preop Hgb A1c 5.4. Continue SSI d/c tubes/lines Continue foley due to diuresing patient and patient in ICU See progression orders   LOS: 7 days    Gaye Pollack 12/14/2014

## 2014-12-15 ENCOUNTER — Inpatient Hospital Stay (HOSPITAL_COMMUNITY): Payer: Medicare Other

## 2014-12-15 LAB — CBC
HCT: 21.8 % — ABNORMAL LOW (ref 39.0–52.0)
Hemoglobin: 7.6 g/dL — ABNORMAL LOW (ref 13.0–17.0)
MCH: 35.3 pg — ABNORMAL HIGH (ref 26.0–34.0)
MCHC: 34.9 g/dL (ref 30.0–36.0)
MCV: 101.4 fL — ABNORMAL HIGH (ref 78.0–100.0)
Platelets: 141 10*3/uL — ABNORMAL LOW (ref 150–400)
RBC: 2.15 MIL/uL — ABNORMAL LOW (ref 4.22–5.81)
RDW: 13.1 % (ref 11.5–15.5)
WBC: 14.2 10*3/uL — ABNORMAL HIGH (ref 4.0–10.5)

## 2014-12-15 LAB — BASIC METABOLIC PANEL
ANION GAP: 8 (ref 5–15)
BUN: 13 mg/dL (ref 6–20)
CO2: 27 mmol/L (ref 22–32)
CREATININE: 1.21 mg/dL (ref 0.61–1.24)
Calcium: 8 mg/dL — ABNORMAL LOW (ref 8.9–10.3)
Chloride: 96 mmol/L — ABNORMAL LOW (ref 101–111)
GFR calc non Af Amer: >60 mL/min (ref >60)
Glucose, Bld: 109 mg/dL — ABNORMAL HIGH (ref 65–99)
Potassium: 3.8 mmol/L (ref 3.5–5.1)
Sodium: 131 mmol/L — ABNORMAL LOW (ref 135–145)

## 2014-12-15 LAB — GLUCOSE, CAPILLARY
Glucose-Capillary: 101 mg/dL — ABNORMAL HIGH (ref 65–99)
Glucose-Capillary: 112 mg/dL — ABNORMAL HIGH (ref 65–99)
Glucose-Capillary: 117 mg/dL — ABNORMAL HIGH (ref 65–99)
Glucose-Capillary: 76 mg/dL (ref 65–99)
Glucose-Capillary: 86 mg/dL (ref 65–99)
Glucose-Capillary: 92 mg/dL (ref 65–99)

## 2014-12-15 MED ORDER — ONDANSETRON HCL 4 MG/2ML IJ SOLN: 4.0000 mg | Freq: Four times a day (QID) | INTRAMUSCULAR | Status: DC | PRN

## 2014-12-15 MED ORDER — MOVING RIGHT ALONG BOOK
Freq: Once | Status: AC
  Administered 2014-12-15: 16:00:00
  Filled 2014-12-15: qty 1

## 2014-12-15 MED ORDER — METOPROLOL TARTRATE 12.5 MG HALF TABLET
12.5000 mg | ORAL_TABLET | Freq: Two times a day (BID) | ORAL | Status: DC
  Administered 2014-12-15 – 2014-12-16 (×2): 12.5 mg via ORAL
  Filled 2014-12-15 (×3): qty 1

## 2014-12-15 MED ORDER — DOCUSATE SODIUM 100 MG PO CAPS
200.0000 mg | ORAL_CAPSULE | Freq: Every day | ORAL | Status: DC
  Administered 2014-12-16 – 2014-12-18 (×3): 200 mg via ORAL
  Filled 2014-12-15 (×4): qty 2

## 2014-12-15 MED ORDER — FUROSEMIDE 40 MG PO TABS
40.0000 mg | ORAL_TABLET | Freq: Every day | ORAL | Status: DC
  Administered 2014-12-15 – 2014-12-18 (×4): 40 mg via ORAL
  Filled 2014-12-15 (×4): qty 1

## 2014-12-15 MED ORDER — SODIUM CHLORIDE 0.9 % IJ SOLN
3.0000 mL | Freq: Two times a day (BID) | INTRAMUSCULAR | Status: DC
  Administered 2014-12-15 – 2014-12-17 (×6): 3 mL via INTRAVENOUS

## 2014-12-15 MED ORDER — POTASSIUM CHLORIDE CRYS ER 20 MEQ PO TBCR: 20.0000 meq | EXTENDED_RELEASE_TABLET | Freq: Two times a day (BID) | ORAL | Status: DC

## 2014-12-15 MED ORDER — FAMOTIDINE 20 MG PO TABS
20.0000 mg | ORAL_TABLET | Freq: Two times a day (BID) | ORAL | Status: DC
  Administered 2014-12-15 – 2014-12-18 (×6): 20 mg via ORAL
  Filled 2014-12-15 (×8): qty 1

## 2014-12-15 MED ORDER — OXYCODONE HCL 5 MG PO TABS
5.0000 mg | ORAL_TABLET | ORAL | Status: DC | PRN
  Administered 2014-12-16 (×2): 5 mg via ORAL
  Filled 2014-12-15 (×3): qty 1

## 2014-12-15 MED ORDER — ACETAMINOPHEN 325 MG PO TABS
650.0000 mg | ORAL_TABLET | Freq: Four times a day (QID) | ORAL | Status: DC | PRN
  Administered 2014-12-15: 650 mg via ORAL
  Filled 2014-12-15: qty 2

## 2014-12-15 MED ORDER — ASPIRIN EC 325 MG PO TBEC
325.0000 mg | DELAYED_RELEASE_TABLET | Freq: Every day | ORAL | Status: DC
  Administered 2014-12-16 – 2014-12-18 (×3): 325 mg via ORAL
  Filled 2014-12-15 (×3): qty 1

## 2014-12-15 MED ORDER — ONDANSETRON HCL 4 MG PO TABS: 4.0000 mg | ORAL_TABLET | Freq: Four times a day (QID) | ORAL | Status: DC | PRN

## 2014-12-15 MED ORDER — TRAMADOL HCL 50 MG PO TABS
50.0000 mg | ORAL_TABLET | ORAL | Status: DC | PRN
  Administered 2014-12-17: 50 mg via ORAL
  Filled 2014-12-15: qty 1

## 2014-12-15 MED ORDER — POTASSIUM CHLORIDE CRYS ER 20 MEQ PO TBCR
20.0000 meq | EXTENDED_RELEASE_TABLET | Freq: Two times a day (BID) | ORAL | Status: DC
  Administered 2014-12-15: 20 meq via ORAL
  Filled 2014-12-15 (×3): qty 1

## 2014-12-15 MED ORDER — BISACODYL 10 MG RE SUPP: 10.0000 mg | Freq: Every day | RECTAL | Status: DC | PRN

## 2014-12-15 MED ORDER — SODIUM CHLORIDE 0.9 % IV SOLN: 250.0000 mL | INTRAVENOUS | Status: DC | PRN

## 2014-12-15 MED ORDER — SODIUM CHLORIDE 0.9 % IJ SOLN: 3.0000 mL | INTRAMUSCULAR | Status: DC | PRN

## 2014-12-15 MED ORDER — BISACODYL 5 MG PO TBEC: 10.0000 mg | DELAYED_RELEASE_TABLET | Freq: Every day | ORAL | Status: DC | PRN

## 2014-12-15 NOTE — Progress Notes (Signed)
2 Days Post-Op Procedure(s) (LRB): CORONARY ARTERY BYPASS GRAFT times five using left internal mammary artery and endoscopic right leg saphenous vein harvest (N/A) INTRAOPERATIVE TRANSESOPHAGEAL ECHOCARDIOGRAM (N/A) Subjective:  No complaints  Objective: Vital signs in last 24 hours: Temp:  [97.5 F (36.4 C)-99 F (37.2 C)] 98.2 F (36.8 C) (07/24 0743) Pulse Rate:  [78-98] 85 (07/24 0800) Cardiac Rhythm:  [-] Normal sinus rhythm (07/24 0800) Resp:  [9-21] 9 (07/24 0800) BP: (85-166)/(51-81) 126/55 mmHg (07/24 0800) SpO2:  [64 %-100 %] 95 % (07/24 0800) Arterial Line BP: (75-142)/(37-62) 109/49 mmHg (07/23 1700) Weight:  [90 kg (198 lb 6.6 oz)] 90 kg (198 lb 6.6 oz) (07/24 0500)  Hemodynamic parameters for last 24 hours:    Intake/Output from previous day: 07/23 0701 - 07/24 0700 In: [id number] [P.O.:1090; I.V.:490; IV Piggyback:150] Out: 2280 [Urine:2260; Chest Tube:20] Intake/Output this shift: Total I/O In: 20 [I.V.:20] Out: 70 [Urine:70]  General appearance: alert and cooperative Heart: regular rate and rhythm, S1, S2 normal, no murmur, click, rub or gallop Lungs: clear to auscultation bilaterally Extremities: edema mild Wound: dressings dry  Lab Results:  Recent Labs  12/14/14 1630 12/14/14 1638 12/15/14 0418  WBC 16.2*  --  14.2*  HGB 9.4* 9.9* 7.6*  HCT 26.8* 29.0* 21.8*  PLT 152  --  141*   BMET:  Recent Labs  12/14/14 0430  12/14/14 1638 12/15/14 0418  NA 131*  --  132* 131*  K 3.8  --  4.1 3.8  CL 99*  --  96* 96*  CO2 23  --   --  27  GLUCOSE 119*  --  106* 109*  BUN 7  --  12 13  CREATININE 1.15  < > 1.20 1.21  CALCIUM 7.9*  --   --  8.0*  < > = values in this interval not displayed.  PT/INR:  Recent Labs  12/13/14 1313  LABPROT 17.9*  INR 1.46   ABG    Component Value Date/Time   PHART 7.410 12/13/2014 1816   HCO3 21.9 12/13/2014 1816   TCO2 23 12/14/2014 1638   ACIDBASEDEF 2.0 12/13/2014 1816   O2SAT 94.0 12/13/2014 1816   CBG  (last 3)   Recent Labs  12/14/14 2354 12/15/14 0359 12/15/14 0741  GLUCAP 76 101* 112*    Assessment/Plan: S/P Procedure(s) (LRB): CORONARY ARTERY BYPASS GRAFT times five using left internal mammary artery and endoscopic right leg saphenous vein harvest (N/A) INTRAOPERATIVE TRANSESOPHAGEAL ECHOCARDIOGRAM (N/A) Mobilize Diuresis Plan for transfer to step-down: see transfer orders  Expected postop blood loss anemia: Hgb went from 9.9 last pm to 7.6 this am. I suspect this is lab error. Will repeat in am. Start Fe2+.   LOS: 8 days    Gaye Pollack 12/15/2014

## 2014-12-16 ENCOUNTER — Encounter (HOSPITAL_COMMUNITY): Payer: Self-pay | Admitting: Surgery

## 2014-12-16 LAB — BASIC METABOLIC PANEL
Anion gap: 10 (ref 5–15)
BUN: 18 mg/dL (ref 6–20)
CALCIUM: 8.7 mg/dL — AB (ref 8.9–10.3)
CHLORIDE: 96 mmol/L — AB (ref 101–111)
CO2: 26 mmol/L (ref 22–32)
Creatinine, Ser: 1.18 mg/dL (ref 0.61–1.24)
GFR calc non Af Amer: >60 mL/min (ref >60)
GLUCOSE: 108 mg/dL — AB (ref 65–99)
Potassium: 4.4 mmol/L (ref 3.5–5.1)
SODIUM: 132 mmol/L — AB (ref 135–145)

## 2014-12-16 LAB — TYPE AND SCREEN
ABO/RH(D): B POS
Antibody Screen: NEGATIVE
UNIT DIVISION: 0
Unit division: 0
Unit division: 0
Unit division: 0
Unit division: 0
Unit division: 0

## 2014-12-16 LAB — CBC
HEMATOCRIT: 27 % — AB (ref 39.0–52.0)
Hemoglobin: 9.4 g/dL — ABNORMAL LOW (ref 13.0–17.0)
MCH: 35.9 pg — ABNORMAL HIGH (ref 26.0–34.0)
MCHC: 34.8 g/dL (ref 30.0–36.0)
MCV: 103.1 fL — AB (ref 78.0–100.0)
Platelets: 166 10*3/uL (ref 150–400)
RBC: 2.62 MIL/uL — AB (ref 4.22–5.81)
RDW: 13 % (ref 11.5–15.5)
WBC: 14.7 10*3/uL — ABNORMAL HIGH (ref 4.0–10.5)

## 2014-12-16 MED ORDER — LACTULOSE 10 GM/15ML PO SOLN
20.0000 g | Freq: Once | ORAL | Status: DC
  Filled 2014-12-16: qty 30

## 2014-12-16 MED ORDER — METOPROLOL TARTRATE 25 MG PO TABS
25.0000 mg | ORAL_TABLET | Freq: Two times a day (BID) | ORAL | Status: DC
Start: 2014-12-16 — End: 2014-12-17
  Administered 2014-12-16: 25 mg via ORAL
  Filled 2014-12-16 (×3): qty 1

## 2014-12-16 MED ORDER — POTASSIUM CHLORIDE CRYS ER 20 MEQ PO TBCR
20.0000 meq | EXTENDED_RELEASE_TABLET | Freq: Every day | ORAL | Status: DC
  Administered 2014-12-16 – 2014-12-18 (×3): 20 meq via ORAL
  Filled 2014-12-16 (×3): qty 1

## 2014-12-16 MED FILL — Magnesium Sulfate Inj 50%: INTRAMUSCULAR | Qty: 10 | Status: AC

## 2014-12-16 MED FILL — Potassium Chloride Inj 2 mEq/ML: INTRAVENOUS | Qty: 40 | Status: AC

## 2014-12-16 MED FILL — Heparin Sodium (Porcine) Inj 1000 Unit/ML: INTRAMUSCULAR | Qty: 30 | Status: AC

## 2014-12-16 NOTE — Progress Notes (Signed)
12/16/2014 11:14 AM EPW d/c per order and per protocol. Ends intact. Pt. Tolerated well. Advised BR x 1 HR. Call bell within reach. VS collected per protocol. Will continue to monitor patient.  [name], [name]

## 2014-12-16 NOTE — Progress Notes (Addendum)
      [address]       [place] [address]             [phone]        3 Days Post-Op Procedure(s) (LRB): CORONARY ARTERY BYPASS GRAFT times five using left internal mammary artery and endoscopic right leg saphenous vein harvest (N/A) INTRAOPERATIVE TRANSESOPHAGEAL ECHOCARDIOGRAM (N/A)  Subjective: Patient about to eat breakfast. No specific complaints. Passing flatus but no bowel movement yet.  Objective: Vital signs in last 24 hours: Temp:  [98.2 F (36.8 C)-100.5 F (38.1 C)] 98.7 F (37.1 C) (07/25 0454) Pulse Rate:  [77-112] 106 (07/25 0454) Cardiac Rhythm:  [-] Normal sinus rhythm (07/24 1954) Resp:  [9-19] 18 (07/25 0454) BP: (113-165)/(55-77) 113/67 mmHg (07/25 0454) SpO2:  [92 %-100 %] 94 % (07/25 0454) Weight:  [193 lb 1.6 oz (87.59 kg)] 193 lb 1.6 oz (87.59 kg) (07/25 0454)  Pre op weight 89 kg Current Weight  12/16/14 193 lb 1.6 oz (87.59 kg)      Intake/Output from previous day: 07/24 0701 - 07/25 0700 In: 603 [P.O.:490; I.V.:63; IV Piggyback:50] Out: 595 [Urine:595]  Physical Exam:  Cardiovascular: RRR Pulmonary: Clear to auscultation bilaterally; no rales, wheezes, or rhonchi. Abdomen: Soft, non tender, bowel sounds present. Extremities: Nolower extremity edema. Wounds: Sternal dressing is clean and dry. Right lower extremity wounds are clean and dry.  Lab Results: CBC: Recent Labs  12/15/14 0418 12/16/14 0352  WBC 14.2* 14.7*  HGB 7.6* 9.4*  HCT 21.8* 27.0*  PLT 141* 166   BMET:  Recent Labs  12/15/14 0418 12/16/14 0352  NA 131* 132*  K 3.8 4.4  CL 96* 96*  CO2 27 26  GLUCOSE 109* 108*  BUN 13 18  CREATININE 1.21 1.18  CALCIUM 8.0* 8.7*    PT/INR:  Lab Results  Component Value Date   INR 1.46 12/13/2014   INR 1.18 12/07/2014   INR 0.98 05/29/2012   ABG:  INR: Will add last result for INR, ABG once components are confirmed Will add last 4 CBG results once components are  confirmed  Assessment/Plan:  1. CV - SR in the 90's this am. On Lopressor 12.5 mg bid. Will increase to 25 mg bid. 2.  Pulmonary - On room air. 3.  Acute blood loss anemia - H and H stable at 9.4 and 27 4. Thrombocytopeniaresolved-platelets up to 166,000 5. Remove EPW 6. LOC constipation 7. Will diurese today and then stop at discharge as below pre op weight and no LE edema 8. Possibly home 1-2 days  ZIMMERMAN,DONIELLE MPA-C 12/16/2014,7:29 AM   Chart reviewed, patient examined, agree with above. He only walked once today and was very fatigued and short of breath and had to stop multiple times. He can't go home until he can walk more comfortably.

## 2014-12-16 NOTE — Care Management Important Message (Signed)
Important Message  Patient Details  Name: [name] MRN: [id number] Date of Birth: 07/30/1950   Medicare Important Message Given:  Yes-fourth notification given    Nathen May 12/16/2014, 2:18 [place] Message  Patient Details  Name: [name] MRN: [id number] Date of Birth: 07-Jan-1951   Medicare Important Message Given:  Yes-fourth notification given    Nathen May 12/16/2014, 2:18 PM

## 2014-12-16 NOTE — Discharge Summary (Signed)
Physician Discharge Summary       [address]       [place] [address]             [phone]    Patient ID: [name] MRN: [id number] DOB/AGE: 08-28-1950 64 y.o.  Admit date: 12/07/2014 Discharge date: 12/18/2014  Admission Diagnoses: 1. S/p NSTEMI 2. History of CAD 3. History of hypertnesion 4.History of hyperlipidemia 5. History of AAA 6. History of PAD 7. History of tobacco abuse 8. History of GERD 9. History of COPD 10. History of Raynaud's 11. History of stroke 12. History of internal carotid artery disease (s/p bilateral CEA's)  Discharge Diagnoses:  1. S/p NSTEMI 2. History of CAD 3. History of hypertnesion 4.History of hyperlipidemia 5. History of AAA 6. History of PAD 7. History of tobacco abuse 8. History of GERD 9. History of COPD 10. History of Raynaud's 11. History of stroke 12. History of internal carotid artery disease (s/p bilateral CEA's) 13. ABL anemia   Procedure (s):  Cardiac catheterization done by Dr. Burt Knack on 12/09/2014: 1. There is mild to moderate left ventricular systolic dysfunction. 2. Mid RCA lesion, 100% stenosed. 3. Prox LAD lesion, 90% stenosed. 4. LM lesion, 75% stenosed. 5. Ost Ramus to Ramus lesion, 99% stenosed. 6. 1st Diag lesion, 90% stenosed. 7. Prox Cx lesion, 70% stenosed.  FINAL CONCLUSIONS:  Chronic occlusion of the RCA with left-to-right collaterals  Severe left-main equivalent disease involving the trifurcation point of the left main, ostial LAD, LCx, and ramus  99% stenosis of the ramus intermedius (suspected culprit lesion)  Moderate segmental LV systolic dysfunction with inferior wall akinesis, LVEF estimated at 45%  Severe calcified coronary arteries, aortic arch, and innominate artery  RECOMMENDATIONS: Surgical evaluation for CABG. See notes of Dr Sammuel Hines in Care Everywhere related to complex AAA. Pt with extensive vascular disease. Will tx to CCU, resume IV heparin, start IV  NTG, and stop Plavix 8. Median Sternotomy 9. Extracorporeal circulation 3. Coronary artery bypass grafting x 5   Left internal mammary graft to the LAD  SVG to diagonal  SVG to Ramus  SVG to OM  SVG to PDA 4. Endoscopic vein harvest from the right leg by Dr. Cyndia Bent on 12/13/2014.  History of Presenting Illness: The patient is a 64 year old vasculopath with hypertension, hyperlipidemia and coronary disease with known prior RCA occlusion that has been managed medically. He presented on 12/07/2014 with a several month history of exertional angina with a one day history of stuttering angina that did not respond to NTG and he ruled in for a NSTEMI with a troponin of 2.65 increasing to 7.83. Cath showed severe LM equivalent disease involving the trifurcation into the LAD, Ramus, and LCX with a 99% proximal Ramus lesion suspected to be the culprit. There is chronic occlusion of the RCA with left to right collaterals. The LVEF is 45%. He has severe calcification of the aortic arch, descending thoracic and abdomino-iliac arteries with a 5 cm x 5.7 cm complex AAA by CT in 07/2013 followed by Dr. Sammuel Hines at [facility]. He was supposed to have a follow up scan a few months ago but developed a staph infection in the left foot that took several months to clear up. He was scheduled to return to Dr. Sammuel Hines for reevaluation later this week. He has had intermittent CP overnight and is now on heparin and NTG. Dr. Cyndia Bent was consulted for the consideration of coronary artery bypass grafting surgery. Potential risks, benefits, and  complications of the surgery were discussed with the patient and he agreed to proceed with surgery. ABI showed 0.88 on the right and 0.72 on the left. After a several day wash out of Plavix, he underwent a CABG x 5 on 12/13/2014.  Brief Hospital Course:  The patient was extubated the evening of surgery without difficulty. He remained afebrile and hemodynamically stable. Gordy Councilman, a line, and  chest tubes were removed early in the post operative course. Foley did remain for a few days then was removed.Lopressor was started and titrated accordingly. He was volume over loaded and diuresed. He had ABL anemia. He was started on oral Ferrous. He did not require a post op transfusion. His last H and H was stable at 9.4 and 27. He did have mild thrombocytopenia post op as well, but this did resolve as his last platelet count was 166,000. He was weaned off the insulin drip. The patient's glucose remained well controlled. The patient's HGA1C pre op was 5.4. The patient was felt surgically stable for transfer from the ICU to PCTU for further convalescence on 12/15/2014. He continues to progress with cardiac rehab. He/she was ambulating on room air. He has been tolerating a diet and has had a bowel movement. Epicardial pacing wires have been removed. Chest tube sutures will removed after discharge in the office.Per Dr. Cyndia Bent, we will restart his Plavix at discharge. Home health RN and PT has been arranged. The patient is felt surgically stable for discharge today.  Latest Vital Signs: Blood pressure 135/74, pulse 85, temperature 99.1 F (37.3 C), temperature source Oral, resp. rate 16, height 5\' 11"  (1.803 m), weight 196 lb 4.8 oz (89.041 kg), SpO2 100 %.  Physical Exam: Cardiovascular: RRR Pulmonary: Clear to auscultation bilaterally; no rales, wheezes, or rhonchi. Abdomen: Soft, non tender, bowel sounds present. Extremities: Nolower extremity edema. Wounds: Sternal dressing is clean and dry. Right lower extremity wounds are clean and dry.  Discharge Condition:Stable and discharged to home  Recent laboratory studies:  Lab Results  Component Value Date   WBC 14.7* 12/16/2014   HGB 9.4* 12/16/2014   HCT 27.0* 12/16/2014   MCV 103.1* 12/16/2014   PLT 166 12/16/2014   Lab Results  Component Value Date   NA 132* 12/16/2014   K 4.4 12/16/2014   CL 96* 12/16/2014   CO2 26 12/16/2014    CREATININE 1.18 12/16/2014   GLUCOSE 108* 12/16/2014     Diagnostic Studies: Dg Chest Port 1 View  12/15/2014   CLINICAL DATA:  64 year old male post CABG 12/13/2014. Subsequent exam.  EXAM: PORTABLE CHEST - 1 VIEW  COMPARISON:  12/14/2014.  FINDINGS: Swan-Ganz catheter removed. Introducer remains in place projecting at the level of the proximal right internal jugular vein.  Left-sided chest tube and mediastinal drain have been removed. No gross pneumothorax.  Mediastinal drain removed. Post CABG. Cardiomegaly. Calcified minimally tortuous aorta.  Consolidation right lung base may represent elevated right hemidiaphragm although limited for excluding right base atelectasis.  IMPRESSION: Swan-Ganz catheter and left-sided chest tube vessels mediastinal drain removed without evidence gross pneumothorax.  Consolidation right lung base may represent elevated right hemidiaphragm although limited for excluding right base atelectasis.  Post CABG with cardiomegaly.   Electronically Signed   By: Genia Del M.D.   On: 12/15/2014 09:37   Discharge Medications:   Medication List    STOP taking these medications        carvedilol 25 MG tablet  Commonly known as:  COREG  furosemide 20 MG tablet  Commonly known as:  LASIX     hydrALAZINE 25 MG tablet  Commonly known as:  APRESOLINE     isosorbide mononitrate 120 MG 24 hr tablet  Commonly known as:  IMDUR     nitroGLYCERIN 0.4 MG SL tablet  Commonly known as:  NITROSTAT      TAKE these medications        aspirin 81 MG tablet  Take 81 mg by mouth daily.     clopidogrel 75 MG tablet  Commonly known as:  PLAVIX  take 1 tablet by mouth once daily     CRESTOR 20 MG tablet  Generic drug:  rosuvastatin  take 1 tablet by mouth once daily at bedtime     cyclobenzaprine 5 MG tablet  Commonly known as:  FLEXERIL  Take 5 mg by mouth 3 (three) times daily as needed for muscle spasms.     lisinopril 5 MG tablet  Commonly known as:   PRINIVIL,ZESTRIL  Take 1 tablet (5 mg total) by mouth daily.     Metoprolol Tartrate 37.5 MG Tabs  Take 37.5 mg by mouth 2 (two) times daily.     oxyCODONE 5 MG immediate release tablet  Commonly known as:  Oxy IR/ROXICODONE  Take 1-2 tablets (5-10 mg total) by mouth every 4 (four) hours as needed for severe pain.     ranitidine 300 MG tablet  Commonly known as:  ZANTAC  take 1 tablet by mouth at bedtime     thiamine 100 MG tablet  Commonly known as:  VITAMIN B-1  Take 100 mg by mouth daily.     triamcinolone cream 0.5 %  Commonly known as:  KENALOG  Apply 1 application topically as needed (for itching).        The patient has been discharged on:   1.Beta Blocker:  Yes [ x  ]                              No   [   ]                              If No, reason:  2.Ace Inhibitor/ARB: Yes [   ]                                     No  [  x  ]                                     If No, reason:Labile BP  3.Statin:   Yes [  x ]                  No  [   ]                  If No, reason:  4.Ecasa:  Yes  [ x  ]                  No   [   ]                  If No, reason:  Follow Up Appointments: Follow-up Information    Follow up  with Gaye Pollack, MD On 01/22/2015.   Specialty:  Cardiothoracic Surgery   Why:  PA/LAT CXR to be taken (at [facility] which is in the same building as Dr. Vivi Martens office) on 01/22/2015 at 9:15 am;Appointment with Dr. Caffie Pinto is at 10:00 am   Contact information:   [address] [place] [address] [phone]       Follow up with Nurse On 12/24/2014.   Why:  Appointment is with nurse only to have chest tube sutures removed. Appointment time is at 10:30 am   Contact information:   [place] [address] [phone]      Follow up with Truitt Merle, NP On 01/01/2015.   Specialties:  Nurse Practitioner, Interventional Cardiology, Cardiology, Radiology   Why:  Appointment time is at 11:30 am    Contact information:   [place] [place] [address] [phone]       Signed: Lars Pinks MPA-C 12/18/2014, 7:55 AM

## 2014-12-16 NOTE — Discharge Instructions (Signed)
Activity: 1.May walk up steps                2.No lifting more than ten pounds for four weeks.                 3.No driving for four weeks.                4.Stop any activity that causes chest pain, shortness of breath, dizziness,  sweating or excessive weakness.                5.Avoid straining.                6.Continue with your breathing exercises daily.  Diet: Low sugar,low fat, and low salt  diet  Wound Care: May shower.  Clean wounds with mild soap and water daily. Contact the office at [phone] if any problems arise.  Coronary Artery Bypass Grafting, Care After Refer to this sheet in the next few weeks. These instructions provide you with information on caring for yourself after your procedure. Your health care provider may also give you more specific instructions. Your treatment has been planned according to current medical practices, but problems sometimes occur. Call your health care provider if you have any problems or questions after your procedure. WHAT TO EXPECT AFTER THE PROCEDURE Recovery from surgery will be different for everyone. Some people feel well after 3 or 4 weeks, while for others it takes longer. After your procedure, it is typical to have the following:  Nausea and a lack of appetite.   Constipation.  Weakness and fatigue.   Depression or irritability.   Pain or discomfort at your incision site. HOME CARE INSTRUCTIONS  Take medicines only as directed by your health care provider. Do not stop taking medicines or start any new medicines without first checking with your health care provider.  Take your pulse as directed by your health care provider.  Perform deep breathing as directed by your health care provider. If you were given a device called an incentive spirometer, use it to practice deep breathing several times a day. Support your chest with a pillow or your arms when you take deep breaths or cough.  Keep incision areas clean, dry, and  protected. Remove or change any bandages (dressings) only as directed by your health care provider. You may have skin adhesive strips over the incision areas. Do not take the strips off. They will fall off on their own.  Check incision areas daily for any swelling, redness, or drainage.  If incisions were made in your legs, do the following:  Avoid crossing your legs.   Avoid sitting for long periods of time. Change positions every 30 minutes.   Elevate your legs when you are sitting.  Wear compression stockings as directed by your health care provider. These stockings help keep blood clots from forming in your legs.  Take showers once your health care provider approves. Until then, only take sponge baths. Pat incisions dry. Do not rub incisions with a washcloth or towel. Do not take baths, swim, or use a hot tub until your health care provider approves.  Eat foods that are high in fiber, such as raw fruits and vegetables, whole grains, beans, and nuts. Meats should be lean cut. Avoid canned, processed, and fried foods.  Drink enough fluid to keep your urine clear or pale yellow.  Weigh yourself every day. This helps identify if you are retaining fluid that may make your heart and  lungs work harder.  Rest and limit activity as directed by your health care provider. You may be instructed to:  Stop any activity at once if you have chest pain, shortness of breath, irregular heartbeats, or dizziness. Get help right away if you have any of these symptoms.  Move around frequently for short periods or take short walks as directed by your health care provider. Increase your activities gradually. You may need physical therapy or cardiac rehabilitation to help strengthen your muscles and build your endurance.  Avoid lifting, pushing, or pulling anything heavier than 10 lb (4.5 kg) for at least 6 weeks after surgery.  Do not drive until your health care provider approves.  Ask your health  care provider when you may return to work.  Ask your health care provider when you may resume sexual activity.  Keep all follow-up visits as directed by your health care provider. This is important. SEEK MEDICAL CARE IF:  You have swelling, redness, increasing pain, or drainage at the site of an incision.  You have a fever.  You have swelling in your ankles or legs.  You have pain in your legs.   You gain 2 or more pounds (0.9 kg) a day.  You are nauseous or vomit.  You have diarrhea. SEEK IMMEDIATE MEDICAL CARE IF:  You have chest pain that goes to your jaw or arms.  You have shortness of breath.   You have a fast or irregular heartbeat.   You notice a "clicking" in your breastbone (sternum) when you move.   You have numbness or weakness in your arms or legs.  You feel dizzy or light-headed.  MAKE SURE YOU:  Understand these instructions.  Will watch your condition.  Will get help right away if you are not doing well or get worse. Document Released: 11/27/2004 Document Revised: 09/24/2013 Document Reviewed: 10/17/2012 [facility] Patient Information 2015 [place], [place]. This information is not intended to replace advice given to you by your health care provider. Make sure you discuss any questions you have with your health care provider.

## 2014-12-16 NOTE — Progress Notes (Signed)
CARDIAC REHAB PHASE I   PRE:  Rate/Rhythm: 100 ST    BP: sitting 150/76    SaO2: 92-93 RA  MODE:  Ambulation: 200 ft   POST:  Rate/Rhythm: 108 ST    BP: sitting 124/65     SaO2: wouldn't register (Raynauds)  Pt just out of BR and SaO2 92-93 RA, even though he was SOB. Used RW. Unsteady due to claudication, his baseline. Apparently able to walk 300 ft yest but today only 200 ft with many rest stops on return trip. Exhausted by end (c/o leg fatigue and SOB). Pt would benefit from rollator at home to help increase his walking tolerance. Wife is also interested in [facility] and [place]. Discussed CRPII and will send referral to [place]. [id number]   Josephina Shih Hamilton CES, ACSM 12/16/2014 11:04 AM

## 2014-12-16 NOTE — Progress Notes (Signed)
UR Completed. Javona Bergevin, RN, BSN.  [phone] 

## 2014-12-17 MED ORDER — ASPIRIN 325 MG PO TBEC: 325.0000 mg | DELAYED_RELEASE_TABLET | Freq: Every day | ORAL | Status: DC

## 2014-12-17 MED ORDER — OXYCODONE HCL 5 MG PO TABS: 5.0000 mg | ORAL_TABLET | ORAL | Status: DC | PRN

## 2014-12-17 MED ORDER — METOPROLOL TARTRATE 37.5 MG PO TABS: 37.5000 mg | ORAL_TABLET | Freq: Two times a day (BID) | ORAL | Status: DC

## 2014-12-17 MED ORDER — METOPROLOL TARTRATE 25 MG PO TABS
37.5000 mg | ORAL_TABLET | Freq: Two times a day (BID) | ORAL | Status: DC
  Administered 2014-12-17 – 2014-12-18 (×3): 37.5 mg via ORAL
  Filled 2014-12-17 (×4): qty 1

## 2014-12-17 NOTE — Progress Notes (Signed)
Walked about 270 feet.  For the last 30 feet, was short of breath and had to sit in a chair and wheeled back to his room

## 2014-12-17 NOTE — Progress Notes (Signed)
CARDIAC REHAB PHASE I   PRE:  Rate/Rhythm: 85 SR  BP:  Sitting: 140/68        SaO2: 97 RA  MODE:  Ambulation: 200 ft   POST:  Rate/Rhythm: 84 SR  BP:  Sitting: 119/67         SaO2: UTA-pt has Raynaud's difficult to read  Pt sitting up in chair, wife at bedside. Pt wife states she is concerned pt is somewhat confused and irritable. She states she mentioned it to staff last night and they attributed it to his pain medication. Pt giving short responses to questions in conversation and seems to have some difficulty following conversation and answering questions fully (when attempting to discuss home use of walker or rollator, pt seemed distracted). Pt was much more quick to respond prior to surgery and seemed less "foggy headed" as his wife describes it. Pt wife states pt has not had pain medication since last night. Pt ambulated 200 ft on RA, rolling walker (pt declined use of rollator although he would benefit from seat for rest), assist x1, unsteady gait (pt does have hx claudication, however, pt wife states he is more unsteady than usual), tolerated fair. Pt c/o some DOE, denies CP, dizziness, standing rest x1. Pt is somewhat impulsive and needs reminders for using sternal precautions and for sitting, standing, proper use of walker. Pt attempted to go to bathroom without assistance, throwing walker to the side and attempted to sit while he was still several feet away from the chair. Pt and wife state some concern about going home today and do not feel pt is strong enough or  "clear headed" enough for discharge. Pt wife states pt does not walk much at home as he is limited by his PVD but he is normally more steady in his gait. Pt would benefit from PT prior to discharge. Discussed with RN who will discuss with MD. Will follow-up tomorrow.   [id number]  [name], RN, BSN 12/17/2014 11:30 AM

## 2014-12-17 NOTE — Care Management Note (Signed)
Case Management Note  Patient Details  Name: [name] MRN: [id number] Date of Birth: 11/20/1950  Subjective/Objective:                  CORONARY ARTERY BYPASS GRAFT   Action/Plan: CM spoke to patient and family at the bedside and they shared that pt likely to be discharged home on 12/18/14 depending on ambulation tonight and tomorrow. CM offered choice to pt who states that he wanted [facility] for [facility] PT, RN and RW.  CM to arrange [facility] on 12/18/14 and will continue to monitor for additional CM needs.   Expected Discharge Date:  12/18/14              Expected Discharge Plan:  [place]  In-House Referral:     Discharge planning Services  CM Consult  Post Acute Care Choice:  Durable Medical Equipment, Home Health Choice offered to:  Patient  DME Arranged:    DME Agency:     HH Arranged:    [place] Agency:     Status of Service:  In process, will continue to follow  Medicare Important Message Given:  Date Medicare IM Given:    Medicare IM give by:    Date Additional Medicare IM Given:    Additional Medicare Important Message give by:     If discussed at [place] of Stay Meetings, dates discussed:    Additional Comments:  Guido Sander, RN 12/17/2014, 8:39 PM

## 2014-12-17 NOTE — Progress Notes (Addendum)
      [address]       [place] [address]             [phone]        4 Days Post-Op Procedure(s) (LRB): CORONARY ARTERY BYPASS GRAFT times five using left internal mammary artery and endoscopic right leg saphenous vein harvest (N/A) INTRAOPERATIVE TRANSESOPHAGEAL ECHOCARDIOGRAM (N/A)  Subjective: Patient eating breakfast this am. He has no specific complaints.  Objective: Vital signs in last 24 hours: Temp:  [98.7 F (37.1 C)-99.3 F (37.4 C)] 99.3 F (37.4 C) (07/26 0539) Pulse Rate:  [9-113] 9 (07/26 0539) Cardiac Rhythm:  [-] Sinus tachycardia (07/25 2000) Resp:  [18-19] 18 (07/26 0539) BP: (109-134)/(65-69) 109/68 mmHg (07/25 1407) SpO2:  [93 %-96 %] 93 % (07/26 0539) Weight:  [196 lb 13.9 oz (89.3 kg)] 196 lb 13.9 oz (89.3 kg) (07/26 0539)  Pre op weight 89 kg Current Weight  12/17/14 196 lb 13.9 oz (89.3 kg)      Intake/Output from previous day: 07/25 0701 - 07/26 0700 In: 1136 [P.O.:1136] Out: 420 [Urine:420]  Physical Exam:  Cardiovascular: RRR Pulmonary: Clear to auscultation bilaterally; no rales, wheezes, or rhonchi. Abdomen: Soft, non tender, bowel sounds present. Extremities: No lower extremity edema. Wounds: All wounds are clean and dry. No signs of infection.  Lab Results: CBC:  Recent Labs  12/15/14 0418 12/16/14 0352  WBC 14.2* 14.7*  HGB 7.6* 9.4*  HCT 21.8* 27.0*  PLT 141* 166   BMET:   Recent Labs  12/15/14 0418 12/16/14 0352  NA 131* 132*  K 3.8 4.4  CL 96* 96*  CO2 27 26  GLUCOSE 109* 108*  BUN 13 18  CREATININE 1.21 1.18  CALCIUM 8.0* 8.7*    PT/INR:  Lab Results  Component Value Date   INR 1.46 12/13/2014   INR 1.18 12/07/2014   INR 0.98 05/29/2012   ABG:  INR: Will add last result for INR, ABG once components are confirmed Will add last 4 CBG results once components are confirmed  Assessment/Plan:  1. CV - SR in the 90's this am. On Lopressor 25 mg bid. Will increase to 37.5 mg  bid as continues to have HR in the low to mid 100's. Also, was on Plavix 75 mg daily pre op. Will discuss with Dr. Cyndia Bent if need to resume at discharge. 2.  Pulmonary - On room air. Encourage incentive spirometer. 3.  Acute blood loss anemia - H and H stable at 9.4 and 27 4. Thrombocytopeniaresolved-platelets up to 166,000 5. Possible discharge later today vs am. Will see how progresses with cardiac rehab today.   ZIMMERMAN,DONIELLE MPA-C 12/17/2014,7:21 AM    Chart reviewed, patient examined, agree with above. He is doing well overall and walked better this afternoon with PT. He is going to do another walk this evening and if he looks ok in the am he can go home tomorrow afternoon when his wife can pick him up.

## 2014-12-17 NOTE — Progress Notes (Signed)
Patient walked about 270 feet. Needed a wheelchair for the last 30 feet due to been "tired". Back in the room now. Will continue to monitor. Patient refused been put on a bed alarm for safety.

## 2014-12-17 NOTE — Evaluation (Signed)
Physical Therapy Evaluation Patient Details Name: [name] MRN: [id number] DOB: June 07, 1950 Today's Date: 12/17/2014   History of Present Illness  64 year old man with extensive peripheral vascular disease and known coronary artery disease. He has an occluded right coronary artery. He has been medically managed. He has noticed some tightness in his chest over the past few days. This morning, it became more severe and did not go away with one nitroglycerin. He came to the emergency room. After additional nitroglycerin, his pain resolved. He feels like something is not right in his chest. The episode he had this morning is different from discomfort that he has had in the past. His troponin was mildly elevated. We are asked to further evaluate.  Pt now s/p CABG x 5.   Clinical Impression  Pt presents with above.  Note limited by decreased awareness of precautions and decreased balance along with impulsivity with mobility.  Assessed use of rollator today per request of wife/pt and cardiac rehab.  He requires steadying A with rollator, however feel that he would be more stable with use of RW to start with at home.  Distance is limited due to pain in PVD, however this is baseline.  Discussed starting with RW at home and that they would be able to purchase rollator in future.  Also discussed starting with HHPT and transitioning to cardiac rehab.  Pt and wife agreeable.     Follow Up Recommendations Home health PT;Supervision for mobility/OOB    Equipment Recommendations  Rolling walker with 5" wheels    Recommendations for Other Services       Precautions / Restrictions Precautions Precautions: Sternal Precaution Comments: Pt requires total cues to recall precautions Restrictions Weight Bearing Restrictions: No      Mobility  Bed Mobility Overal bed mobility: Needs Assistance Bed Mobility: Supine to Sit;Sit to Supine     Supine to sit: Supervision Sit to supine: Supervision    General bed mobility comments: S for safety with cues for maintaining sternal precautions.   Transfers Overall transfer level: Needs assistance Equipment used: 4-wheeled walker Transfers: Sit to/from Stand Sit to Stand: Min assist         General transfer comment: Pt requires demonstration and cues for hand placement on thighs to maintain sternal precautions as well as cues for scooting to edge of chair prior to standing.   Ambulation/Gait Ambulation/Gait assistance: Min guard Ambulation Distance (Feet): 200 Feet Assistive device: 4-wheeled walker Gait Pattern/deviations: Step-through pattern;Decreased stride length;Trunk flexed     General Gait Details: Demonstration and cues for safe use of rollator as well as how to safely function brakes and turn and sit when needing to due to fatigue.  Pt require mod cues throughout for safe use of rollator.    Stairs Stairs: Yes Stairs assistance: Min assist Stair Management: One rail Right;Step to pattern;Forwards Number of Stairs: 3 General stair comments: Cues for safety and how to have wife assist if rails are too far apart.   Wheelchair Mobility    Modified Rankin (Stroke Patients Only)       Balance Overall balance assessment: Needs assistance Sitting-balance support: Feet supported Sitting balance-Leahy Scale: Good     Standing balance support: During functional activity;Bilateral upper extremity supported Standing balance-Leahy Scale: Poor Standing balance comment: Requires use of UEs or rollator for support during balance.  Pertinent Vitals/Pain Pain Assessment: 0-10 Pain Score: 2  Pain Location: chest  Pain Descriptors / Indicators: Aching Pain Intervention(s): Limited activity within patient's tolerance;Monitored during session;Premedicated before session    Home Living Family/patient expects to be discharged to:: Private residence Living Arrangements:  Spouse/significant other Available Help at Discharge: Available 24 hours/day;Family Type of Home: House Home Access: Stairs to enter Entrance Stairs-Rails: Psychiatric nurse of Steps: 3 Home Layout: Multi-level Home Equipment: Grab bars - tub/shower;None      Prior Function Level of Independence: Independent               Hand Dominance        Extremity/Trunk Assessment   Upper Extremity Assessment: Generalized weakness           Lower Extremity Assessment: Generalized weakness      Cervical / Trunk Assessment: Normal  Communication   Communication: No difficulties  Cognition Arousal/Alertness: Awake/alert Behavior During Therapy: WFL for tasks assessed/performed Overall Cognitive Status: Within Functional Limits for tasks assessed                      General Comments      Exercises        Assessment/Plan    PT Assessment Patient needs continued PT services  PT Diagnosis Difficulty walking;Generalized weakness;Acute pain   PT Problem List Decreased strength;Decreased activity tolerance;Decreased balance;Decreased mobility;Decreased knowledge of use of DME;Decreased safety awareness;Decreased knowledge of precautions;Cardiopulmonary status limiting activity  PT Treatment Interventions DME instruction;Gait training;Stair training;Functional mobility training;Therapeutic activities;Therapeutic exercise;Balance training;Patient/family education   PT Goals (Current goals can be found in the Care Plan section) Acute Rehab PT Goals Patient Stated Goal: to return home PT Goal Formulation: With patient/family Time For Goal Achievement: 12/24/14 Potential to Achieve Goals: Good    Frequency Min 3X/week   Barriers to discharge        Co-evaluation               End of Session Equipment Utilized During Treatment: Gait belt Activity Tolerance: Patient tolerated treatment well Patient left: in chair;with call bell/phone  within reach;with family/visitor present Nurse Communication: Mobility status         Time: 1539-1611 PT Time Calculation (min) (ACUTE ONLY): 32 min   Charges:   PT Evaluation $Initial PT Evaluation Tier I: 1 Procedure PT Treatments $Gait Training: 8-22 mins   PT G CodesDenice Bors 12/17/2014, 4:44 PM

## 2014-12-18 MED ORDER — LISINOPRIL 2.5 MG PO TABS
2.5000 mg | ORAL_TABLET | Freq: Every day | ORAL | Status: DC
  Administered 2014-12-18: 2.5 mg via ORAL
  Filled 2014-12-18: qty 1

## 2014-12-18 MED ORDER — LISINOPRIL 5 MG PO TABS: 5.0000 mg | ORAL_TABLET | Freq: Two times a day (BID) | ORAL | Status: DC

## 2014-12-18 MED ORDER — LISINOPRIL 5 MG PO TABS: 5.0000 mg | ORAL_TABLET | Freq: Every day | ORAL | Status: DC

## 2014-12-18 NOTE — Progress Notes (Addendum)
      [address]       [place] [address]             [phone]        5 Days Post-Op Procedure(s) (LRB): CORONARY ARTERY BYPASS GRAFT times five using left internal mammary artery and endoscopic right leg saphenous vein harvest (N/A) INTRAOPERATIVE TRANSESOPHAGEAL ECHOCARDIOGRAM (N/A)  Subjective: Patient eating breakfast this am. He has no specific complaints.  Objective: Vital signs in last 24 hours: Temp:  [98.2 F (36.8 C)-99.1 F (37.3 C)] 99.1 F (37.3 C) (07/27 0612) Pulse Rate:  [80-86] 85 (07/27 0612) Cardiac Rhythm:  [-] Normal sinus rhythm (07/26 1930) Resp:  [16-18] 16 (07/27 0612) BP: (122-135)/(62-76) 135/74 mmHg (07/27 0612) SpO2:  [96 %-100 %] 100 % (07/27 0612) Weight:  [196 lb 4.8 oz (89.041 kg)] 196 lb 4.8 oz (89.041 kg) (07/27 0612)  Pre op weight 89 kg Current Weight  12/18/14 196 lb 4.8 oz (89.041 kg)      Intake/Output from previous day: 07/26 0701 - 07/27 0700 In: 240 [P.O.:240] Out: 180 [Urine:180]  Physical Exam:  Cardiovascular: RRR Pulmonary: Clear to auscultation bilaterally; no rales, wheezes, or rhonchi. Abdomen: Soft, non tender, bowel sounds present. Extremities: No lower extremity edema. Wounds: All wounds are clean and dry. No signs of infection.  Lab Results: CBC:  Recent Labs  12/16/14 0352  WBC 14.7*  HGB 9.4*  HCT 27.0*  PLT 166   BMET:   Recent Labs  12/16/14 0352  NA 132*  K 4.4  CL 96*  CO2 26  GLUCOSE 108*  BUN 18  CREATININE 1.18  CALCIUM 8.7*    PT/INR:  Lab Results  Component Value Date   INR 1.46 12/13/2014   INR 1.18 12/07/2014   INR 0.98 05/29/2012   ABG:  INR: Will add last result for INR, ABG once components are confirmed Will add last 4 CBG results once components are confirmed  Assessment/Plan:  1. CV - SR in the 90's this am. On Lopressor 37.5 mg bid. Will restart Plavix as taken pre op at discharge.Will restart low dose Lisinopril for better BP  control. 2.  Pulmonary - On room air. Encourage incentive spirometer. 3.  Acute blood loss anemia - H and H stable at 9.4 and 27 4. Will discharge later today with [facility] RN and PT.   ZIMMERMAN,DONIELLE MPA-C 12/18/2014,7:43 AM     Chart reviewed, patient examined, agree with above. He is doing well and ready to go home today.

## 2014-12-18 NOTE — Progress Notes (Addendum)
CARDIAC REHAB PHASE I   Pt up ad lib in room today, states he is waiting to go home and has already walked today. [facility] discharge education completed with pt wife at bedside. Reviewed IS, sternal precautions, activity progression, exercise, risk factors, heart healthy diet, daily weights, sodium restrictions, and phase 2 cardiac rehab. Pt verbalized understanding. Pt agrees to phase 2 cardiac rehab. Will send referral to [facility].  Pt declined cardiac surgery discharge video, states he has no other questions at this time. Pt sitting in recliner, call light within reach, wife at bedside.    RL:[id number]  [name], RN, BSN 12/18/2014 12:12 PM

## 2014-12-18 NOTE — Care Management Important Message (Signed)
Important Message  Patient Details  Name: [name] MRN: [id number] Date of Birth: [birth year]   Medicare Important Message Given:  Yes-fourth notification given    Nathen May 12/18/2014, 12:06 [place] Message  Patient Details  Name: [name] MRN: [id number] Date of Birth: 10/04/1950   Medicare Important Message Given:  Yes-fourth notification given    Nathen May 12/18/2014, 12:06 PM

## 2014-12-18 NOTE — Care Management Note (Addendum)
Case Management Note  Patient Details  Name: [name] MRN: [id number] Date of Birth: 24-Oct-1950  Subjective/Objective:                  CORONARY ARTERY BYPASS GRAFT   Action/Plan: CM spoke to patient and family at the bedside and they shared that pt likely to be discharged home on 12/18/14 depending on ambulation tonight and tomorrow. CM offered choice to pt who states that he wanted [facility] for [facility] PT, RN and RW.  CM to arrange [facility] on 12/18/14 and will continue to monitor for additional CM needs.   Expected Discharge Date:  12/18/14              Expected Discharge Plan:  [place]  In-House Referral:     Discharge planning Services  CM Consult  Post Acute Care Choice:  Durable Medical Equipment, Home Health Choice offered to:  Patient  DME Arranged:  RW DME Agency:  [facility]  HH Arranged:  RN,PT [place] Agency:  [facility]  Status of Service:  Complete, will sign off  Medicare Important Message Given:  Date Medicare IM Given:    Medicare IM give by:    Date Additional Medicare IM Given:    Additional Medicare Important Message give by:     If discussed at [place] of Stay Meetings, dates discussed:    Additional Comments: Pt offered choice, pt chose [facility], CM contacted agency, referral accepted.  CM verified pt address and phone numbers against epic.  Agency informed pt has discharge orders at this time.  Wife will provide 24 hour supervision as recommended. Maryclare Labrador, RN 12/18/2014, 8:31 AM

## 2014-12-22 DIAGNOSIS — Z87891 Personal history of nicotine dependence: Secondary | ICD-10-CM | POA: Diagnosis not present

## 2014-12-22 DIAGNOSIS — K219 Gastro-esophageal reflux disease without esophagitis: Secondary | ICD-10-CM | POA: Diagnosis not present

## 2014-12-22 DIAGNOSIS — I214 Non-ST elevation (NSTEMI) myocardial infarction: Secondary | ICD-10-CM | POA: Diagnosis not present

## 2014-12-22 DIAGNOSIS — I10 Essential (primary) hypertension: Secondary | ICD-10-CM | POA: Diagnosis not present

## 2014-12-22 DIAGNOSIS — Z48812 Encounter for surgical aftercare following surgery on the circulatory system: Secondary | ICD-10-CM | POA: Diagnosis not present

## 2014-12-22 DIAGNOSIS — Z951 Presence of aortocoronary bypass graft: Secondary | ICD-10-CM | POA: Diagnosis not present

## 2014-12-22 DIAGNOSIS — E785 Hyperlipidemia, unspecified: Secondary | ICD-10-CM | POA: Diagnosis not present

## 2014-12-22 DIAGNOSIS — I739 Peripheral vascular disease, unspecified: Secondary | ICD-10-CM | POA: Diagnosis not present

## 2014-12-22 DIAGNOSIS — J449 Chronic obstructive pulmonary disease, unspecified: Secondary | ICD-10-CM | POA: Diagnosis not present

## 2014-12-22 DIAGNOSIS — I2510 Atherosclerotic heart disease of native coronary artery without angina pectoris: Secondary | ICD-10-CM | POA: Diagnosis not present

## 2014-12-23 DIAGNOSIS — I214 Non-ST elevation (NSTEMI) myocardial infarction: Secondary | ICD-10-CM | POA: Diagnosis not present

## 2014-12-23 DIAGNOSIS — Z48812 Encounter for surgical aftercare following surgery on the circulatory system: Secondary | ICD-10-CM | POA: Diagnosis not present

## 2014-12-23 DIAGNOSIS — I739 Peripheral vascular disease, unspecified: Secondary | ICD-10-CM | POA: Diagnosis not present

## 2014-12-23 DIAGNOSIS — I10 Essential (primary) hypertension: Secondary | ICD-10-CM | POA: Diagnosis not present

## 2014-12-23 DIAGNOSIS — J449 Chronic obstructive pulmonary disease, unspecified: Secondary | ICD-10-CM | POA: Diagnosis not present

## 2014-12-23 DIAGNOSIS — I2510 Atherosclerotic heart disease of native coronary artery without angina pectoris: Secondary | ICD-10-CM | POA: Diagnosis not present

## 2014-12-24 ENCOUNTER — Ambulatory Visit: Payer: Medicare Other | Admitting: *Deleted

## 2014-12-24 ENCOUNTER — Other Ambulatory Visit: Payer: Self-pay | Admitting: *Deleted

## 2014-12-24 DIAGNOSIS — Z951 Presence of aortocoronary bypass graft: Secondary | ICD-10-CM

## 2014-12-24 DIAGNOSIS — Z4802 Encounter for removal of sutures: Secondary | ICD-10-CM

## 2014-12-24 DIAGNOSIS — R609 Edema, unspecified: Secondary | ICD-10-CM

## 2014-12-24 DIAGNOSIS — I2510 Atherosclerotic heart disease of native coronary artery without angina pectoris: Secondary | ICD-10-CM

## 2014-12-24 MED ORDER — FUROSEMIDE 20 MG PO TABS: 40.0000 mg | ORAL_TABLET | Freq: Every day | ORAL | Status: DC

## 2014-12-24 MED ORDER — POTASSIUM CHLORIDE CRYS ER 10 MEQ PO TBCR: 20.0000 meq | EXTENDED_RELEASE_TABLET | Freq: Every day | ORAL | Status: DC

## 2014-12-24 NOTE — Progress Notes (Signed)
[name] returns s/p CABG X 5 for the removal of his chest tube sutures.  These were easily removed without discomfort. These sites as well as his sternal incision and right leg evh sites are all very well healed. He is eating  And his bowels are moving.  He is walking with the assistance of a walker.  His main issue is lower extremity swelling particularly in the right leg and both feet. His renal function was normal on discharge. Truitt Merle, N.P. had prescribed Lasix preop per his wife and she showed me his prescription.  I suggested he resume his Lasik and also Potassium until seen by cardiology and they agree.  These scripts were e prescribed to his pharmacy.  I will inform Dr. Cyndia Bent of his visit and call them if there are any changes. Otherwise he will be seen as scheduled with a cxr.

## 2014-12-25 ENCOUNTER — Telehealth: Payer: Self-pay | Admitting: *Deleted

## 2014-12-25 DIAGNOSIS — I10 Essential (primary) hypertension: Secondary | ICD-10-CM | POA: Diagnosis not present

## 2014-12-25 DIAGNOSIS — I739 Peripheral vascular disease, unspecified: Secondary | ICD-10-CM | POA: Diagnosis not present

## 2014-12-25 DIAGNOSIS — Z48812 Encounter for surgical aftercare following surgery on the circulatory system: Secondary | ICD-10-CM | POA: Diagnosis not present

## 2014-12-25 DIAGNOSIS — I214 Non-ST elevation (NSTEMI) myocardial infarction: Secondary | ICD-10-CM | POA: Diagnosis not present

## 2014-12-25 DIAGNOSIS — J449 Chronic obstructive pulmonary disease, unspecified: Secondary | ICD-10-CM | POA: Diagnosis not present

## 2014-12-25 DIAGNOSIS — I2510 Atherosclerotic heart disease of native coronary artery without angina pectoris: Secondary | ICD-10-CM | POA: Diagnosis not present

## 2014-12-25 NOTE — Telephone Encounter (Signed)
[name] was seen yesterday for suture removal s/p CABG and Lasix/K was ordered for persistent lower extremity swelling, more right than left.  His wife has called saying he is very much pain when trying to walk and the leg is still significantly swollen after two doses of Lasix. I offered him an appointment today but he wanted "to give it another day".  I said he might need to have his leg scanned to rule out a clot.  She will call tomorrow.  He would be able to see Dr. Cyndia Bent at that time.

## 2014-12-27 ENCOUNTER — Ambulatory Visit: Payer: Medicare Other | Admitting: Surgery

## 2014-12-27 ENCOUNTER — Telehealth: Payer: Self-pay | Admitting: *Deleted

## 2014-12-27 ENCOUNTER — Emergency Department (HOSPITAL_COMMUNITY): Payer: Medicare Other

## 2014-12-27 ENCOUNTER — Inpatient Hospital Stay (HOSPITAL_COMMUNITY)
Admission: EM | Admit: 2014-12-27 | Discharge: 2015-01-05 | DRG: 280 | Disposition: A | Discharge status: Home Health | Payer: Medicare Other | Attending: Internal Medicine | Admitting: Internal Medicine

## 2014-12-27 ENCOUNTER — Encounter (HOSPITAL_COMMUNITY): Payer: Self-pay | Admitting: Emergency Medicine

## 2014-12-27 DIAGNOSIS — K219 Gastro-esophageal reflux disease without esophagitis: Secondary | ICD-10-CM | POA: Diagnosis present

## 2014-12-27 DIAGNOSIS — Z95820 Peripheral vascular angioplasty status with implants and grafts: Secondary | ICD-10-CM

## 2014-12-27 DIAGNOSIS — Z8679 Personal history of other diseases of the circulatory system: Secondary | ICD-10-CM | POA: Diagnosis not present

## 2014-12-27 DIAGNOSIS — Z8673 Personal history of transient ischemic attack (TIA), and cerebral infarction without residual deficits: Secondary | ICD-10-CM | POA: Diagnosis not present

## 2014-12-27 DIAGNOSIS — F419 Anxiety disorder, unspecified: Secondary | ICD-10-CM | POA: Diagnosis present

## 2014-12-27 DIAGNOSIS — I82411 Acute embolism and thrombosis of right femoral vein: Secondary | ICD-10-CM | POA: Diagnosis not present

## 2014-12-27 DIAGNOSIS — E162 Hypoglycemia, unspecified: Secondary | ICD-10-CM | POA: Diagnosis not present

## 2014-12-27 DIAGNOSIS — R0600 Dyspnea, unspecified: Secondary | ICD-10-CM

## 2014-12-27 DIAGNOSIS — I82441 Acute embolism and thrombosis of right tibial vein: Principal | ICD-10-CM | POA: Diagnosis present

## 2014-12-27 DIAGNOSIS — N179 Acute kidney failure, unspecified: Secondary | ICD-10-CM | POA: Diagnosis present

## 2014-12-27 DIAGNOSIS — M79606 Pain in leg, unspecified: Secondary | ICD-10-CM

## 2014-12-27 DIAGNOSIS — I82431 Acute embolism and thrombosis of right popliteal vein: Secondary | ICD-10-CM | POA: Diagnosis present

## 2014-12-27 DIAGNOSIS — Z7982 Long term (current) use of aspirin: Secondary | ICD-10-CM | POA: Diagnosis not present

## 2014-12-27 DIAGNOSIS — D7582 Heparin induced thrombocytopenia (HIT): Secondary | ICD-10-CM | POA: Diagnosis not present

## 2014-12-27 DIAGNOSIS — I2510 Atherosclerotic heart disease of native coronary artery without angina pectoris: Secondary | ICD-10-CM | POA: Diagnosis present

## 2014-12-27 DIAGNOSIS — I714 Abdominal aortic aneurysm, without rupture, unspecified: Secondary | ICD-10-CM | POA: Diagnosis present

## 2014-12-27 DIAGNOSIS — I2699 Other pulmonary embolism without acute cor pulmonale: Secondary | ICD-10-CM | POA: Diagnosis not present

## 2014-12-27 DIAGNOSIS — I82402 Acute embolism and thrombosis of unspecified deep veins of left lower extremity: Secondary | ICD-10-CM | POA: Diagnosis not present

## 2014-12-27 DIAGNOSIS — R079 Chest pain, unspecified: Secondary | ICD-10-CM | POA: Diagnosis not present

## 2014-12-27 DIAGNOSIS — D520 Dietary folate deficiency anemia: Secondary | ICD-10-CM | POA: Diagnosis not present

## 2014-12-27 DIAGNOSIS — Z79899 Other long term (current) drug therapy: Secondary | ICD-10-CM

## 2014-12-27 DIAGNOSIS — E871 Hypo-osmolality and hyponatremia: Secondary | ICD-10-CM | POA: Diagnosis present

## 2014-12-27 DIAGNOSIS — I214 Non-ST elevation (NSTEMI) myocardial infarction: Secondary | ICD-10-CM | POA: Diagnosis present

## 2014-12-27 DIAGNOSIS — I739 Peripheral vascular disease, unspecified: Secondary | ICD-10-CM | POA: Diagnosis present

## 2014-12-27 DIAGNOSIS — D649 Anemia, unspecified: Secondary | ICD-10-CM | POA: Diagnosis not present

## 2014-12-27 DIAGNOSIS — I82401 Acute embolism and thrombosis of unspecified deep veins of right lower extremity: Secondary | ICD-10-CM

## 2014-12-27 DIAGNOSIS — Z8249 Family history of ischemic heart disease and other diseases of the circulatory system: Secondary | ICD-10-CM | POA: Diagnosis not present

## 2014-12-27 DIAGNOSIS — I252 Old myocardial infarction: Secondary | ICD-10-CM | POA: Diagnosis not present

## 2014-12-27 DIAGNOSIS — Z951 Presence of aortocoronary bypass graft: Secondary | ICD-10-CM

## 2014-12-27 DIAGNOSIS — J449 Chronic obstructive pulmonary disease, unspecified: Secondary | ICD-10-CM | POA: Diagnosis present

## 2014-12-27 DIAGNOSIS — E161 Other hypoglycemia: Secondary | ICD-10-CM | POA: Diagnosis not present

## 2014-12-27 DIAGNOSIS — K5900 Constipation, unspecified: Secondary | ICD-10-CM | POA: Diagnosis not present

## 2014-12-27 DIAGNOSIS — Z87891 Personal history of nicotine dependence: Secondary | ICD-10-CM | POA: Diagnosis not present

## 2014-12-27 DIAGNOSIS — M79604 Pain in right leg: Secondary | ICD-10-CM | POA: Diagnosis not present

## 2014-12-27 DIAGNOSIS — M79605 Pain in left leg: Secondary | ICD-10-CM | POA: Diagnosis not present

## 2014-12-27 DIAGNOSIS — E860 Dehydration: Secondary | ICD-10-CM | POA: Diagnosis present

## 2014-12-27 DIAGNOSIS — I82409 Acute embolism and thrombosis of unspecified deep veins of unspecified lower extremity: Secondary | ICD-10-CM | POA: Diagnosis not present

## 2014-12-27 DIAGNOSIS — Z7902 Long term (current) use of antithrombotics/antiplatelets: Secondary | ICD-10-CM

## 2014-12-27 DIAGNOSIS — E785 Hyperlipidemia, unspecified: Secondary | ICD-10-CM | POA: Diagnosis present

## 2014-12-27 DIAGNOSIS — N170 Acute kidney failure with tubular necrosis: Secondary | ICD-10-CM | POA: Diagnosis not present

## 2014-12-27 DIAGNOSIS — I10 Essential (primary) hypertension: Secondary | ICD-10-CM | POA: Diagnosis not present

## 2014-12-27 DIAGNOSIS — J90 Pleural effusion, not elsewhere classified: Secondary | ICD-10-CM

## 2014-12-27 DIAGNOSIS — D62 Acute posthemorrhagic anemia: Secondary | ICD-10-CM | POA: Diagnosis present

## 2014-12-27 DIAGNOSIS — S299XXA Unspecified injury of thorax, initial encounter: Secondary | ICD-10-CM | POA: Diagnosis not present

## 2014-12-27 HISTORY — DX: Heart failure, unspecified: I50.9

## 2014-12-27 HISTORY — DX: Acute myocardial infarction, unspecified: I21.9

## 2014-12-27 LAB — COMPREHENSIVE METABOLIC PANEL
ALT: 17 U/L (ref 17–63)
AST: 24 U/L (ref 15–41)
Albumin: 2.8 g/dL — ABNORMAL LOW (ref 3.5–5.0)
Alkaline Phosphatase: 68 U/L (ref 38–126)
Anion gap: 12 (ref 5–15)
BILIRUBIN TOTAL: 1.4 mg/dL — AB (ref 0.3–1.2)
BUN: 24 mg/dL — ABNORMAL HIGH (ref 6–20)
CO2: 25 mmol/L (ref 22–32)
CREATININE: 1.93 mg/dL — AB (ref 0.61–1.24)
Calcium: 8.7 mg/dL — ABNORMAL LOW (ref 8.9–10.3)
Chloride: 93 mmol/L — ABNORMAL LOW (ref 101–111)
GFR calc Af Amer: 40 mL/min — ABNORMAL LOW (ref >60)
GFR calc non Af Amer: 35 mL/min — ABNORMAL LOW (ref >60)
Glucose, Bld: 106 mg/dL — ABNORMAL HIGH (ref 65–99)
POTASSIUM: 3.9 mmol/L (ref 3.5–5.1)
Sodium: 130 mmol/L — ABNORMAL LOW (ref 135–145)
Total Protein: 6.2 g/dL — ABNORMAL LOW (ref 6.5–8.1)

## 2014-12-27 LAB — APTT: aPTT: 31 seconds (ref 24–37)

## 2014-12-27 LAB — CBC
HEMATOCRIT: 22.3 % — AB (ref 39.0–52.0)
HEMOGLOBIN: 7.7 g/dL — AB (ref 13.0–17.0)
MCH: 33.5 pg (ref 26.0–34.0)
MCHC: 34.5 g/dL (ref 30.0–36.0)
MCV: 97 fL (ref 78.0–100.0)
Platelets: 153 10*3/uL (ref 150–400)
RBC: 2.3 MIL/uL — ABNORMAL LOW (ref 4.22–5.81)
RDW: 13.7 % (ref 11.5–15.5)
WBC: 7.4 10*3/uL (ref 4.0–10.5)

## 2014-12-27 LAB — URINALYSIS, ROUTINE W REFLEX MICROSCOPIC
BILIRUBIN URINE: NEGATIVE
GLUCOSE, UA: NEGATIVE mg/dL
HGB URINE DIPSTICK: NEGATIVE
Ketones, ur: NEGATIVE mg/dL
Leukocytes, UA: NEGATIVE
Nitrite: NEGATIVE
Protein, ur: NEGATIVE mg/dL
SPECIFIC GRAVITY, URINE: 1.008 (ref 1.005–1.030)
UROBILINOGEN UA: 0.2 mg/dL (ref 0.0–1.0)
pH: 6 (ref 5.0–8.0)

## 2014-12-27 LAB — VITAMIN B12: Vitamin B-12: 691 pg/mL (ref 180–914)

## 2014-12-27 LAB — IRON AND TIBC
Iron: 24 ug/dL — ABNORMAL LOW (ref 45–182)
Saturation Ratios: 11 % — ABNORMAL LOW (ref 17.9–39.5)
TIBC: 213 ug/dL — ABNORMAL LOW (ref 250–450)
UIBC: 189 ug/dL

## 2014-12-27 LAB — BRAIN NATRIURETIC PEPTIDE: B NATRIURETIC PEPTIDE 5: 568.1 pg/mL — AB (ref 0.0–100.0)

## 2014-12-27 LAB — FOLATE: Folate: 10.3 ng/mL (ref >5.9)

## 2014-12-27 LAB — RETICULOCYTES
RBC.: 2.21 MIL/uL — ABNORMAL LOW (ref 4.22–5.81)
Retic Count, Absolute: 86.2 10*3/uL (ref 19.0–186.0)
Retic Ct Pct: 3.9 % — ABNORMAL HIGH (ref 0.4–3.1)

## 2014-12-27 LAB — I-STAT TROPONIN, ED: TROPONIN I, POC: 0.05 ng/mL (ref 0.00–0.08)

## 2014-12-27 LAB — FERRITIN: FERRITIN: 627 ng/mL — AB (ref 24–336)

## 2014-12-27 LAB — PROTIME-INR
INR: 1.27 (ref 0.00–1.49)
PROTHROMBIN TIME: 16 s — AB (ref 11.6–15.2)

## 2014-12-27 LAB — CK: CK TOTAL: 43 U/L — AB (ref 49–397)

## 2014-12-27 MED ORDER — HYDROMORPHONE HCL 1 MG/ML IJ SOLN
1.0000 mg | Freq: Once | INTRAMUSCULAR | Status: AC
  Administered 2014-12-27: 1 mg via INTRAVENOUS
  Filled 2014-12-27: qty 1

## 2014-12-27 MED ORDER — HEPARIN BOLUS VIA INFUSION
4000.0000 [IU] | Freq: Once | INTRAVENOUS | Status: AC
  Administered 2014-12-27: 4000 [IU] via INTRAVENOUS
  Filled 2014-12-27: qty 4000

## 2014-12-27 MED ORDER — OXYCODONE HCL 5 MG PO TABS
5.0000 mg | ORAL_TABLET | ORAL | Status: DC | PRN
  Administered 2014-12-27 – 2014-12-29 (×5): 5 mg via ORAL
  Administered 2014-12-29: 10 mg via ORAL
  Administered 2014-12-29: 5 mg via ORAL
  Administered 2014-12-30: 10 mg via ORAL
  Administered 2014-12-30: 5 mg via ORAL
  Administered 2014-12-31 – 2015-01-02 (×4): 10 mg via ORAL
  Filled 2014-12-27: qty 2
  Filled 2014-12-27: qty 1
  Filled 2014-12-27 (×3): qty 2
  Filled 2014-12-27 (×5): qty 1
  Filled 2014-12-27: qty 2
  Filled 2014-12-27 (×2): qty 1
  Filled 2014-12-27: qty 2

## 2014-12-27 MED ORDER — METOPROLOL TARTRATE 25 MG PO TABS
37.5000 mg | ORAL_TABLET | Freq: Two times a day (BID) | ORAL | Status: DC
  Administered 2014-12-27 – 2014-12-30 (×7): 37.5 mg via ORAL
  Filled 2014-12-27 (×9): qty 1.5

## 2014-12-27 MED ORDER — SODIUM CHLORIDE 0.9 % IV SOLN
INTRAVENOUS | Status: DC
  Administered 2014-12-27: 15:00:00 via INTRAVENOUS
  Administered 2014-12-28: 75 mL/h via INTRAVENOUS

## 2014-12-27 MED ORDER — ONDANSETRON HCL 4 MG/2ML IJ SOLN
4.0000 mg | Freq: Once | INTRAMUSCULAR | Status: AC
  Administered 2014-12-27: 4 mg via INTRAVENOUS
  Filled 2014-12-27: qty 2

## 2014-12-27 MED ORDER — ALUM & MAG HYDROXIDE-SIMETH 200-200-20 MG/5ML PO SUSP: 30.0000 mL | Freq: Four times a day (QID) | ORAL | Status: DC | PRN

## 2014-12-27 MED ORDER — ACETAMINOPHEN 325 MG PO TABS
650.0000 mg | ORAL_TABLET | Freq: Four times a day (QID) | ORAL | Status: DC | PRN
  Administered 2014-12-27 – 2015-01-05 (×10): 650 mg via ORAL
  Filled 2014-12-27 (×9): qty 2

## 2014-12-27 MED ORDER — MORPHINE SULFATE 2 MG/ML IJ SOLN: 1.0000 mg | INTRAMUSCULAR | Status: DC | PRN

## 2014-12-27 MED ORDER — ASPIRIN EC 81 MG PO TBEC
81.0000 mg | DELAYED_RELEASE_TABLET | Freq: Every evening | ORAL | Status: DC
  Administered 2014-12-27 – 2014-12-30 (×4): 81 mg via ORAL
  Filled 2014-12-27 (×5): qty 1

## 2014-12-27 MED ORDER — HEPARIN (PORCINE) IN NACL 100-0.45 UNIT/ML-% IJ SOLN
1450.0000 [IU]/h | INTRAMUSCULAR | Status: AC
  Administered 2014-12-27 – 2014-12-29 (×3): 1450 [IU]/h via INTRAVENOUS
  Filled 2014-12-27 (×7): qty 250

## 2014-12-27 MED ORDER — ONDANSETRON HCL 4 MG/2ML IJ SOLN: 4.0000 mg | Freq: Four times a day (QID) | INTRAMUSCULAR | Status: DC | PRN

## 2014-12-27 MED ORDER — FAMOTIDINE 20 MG PO TABS
20.0000 mg | ORAL_TABLET | Freq: Two times a day (BID) | ORAL | Status: DC
  Administered 2014-12-27 – 2015-01-05 (×18): 20 mg via ORAL
  Filled 2014-12-27 (×19): qty 1

## 2014-12-27 MED ORDER — SODIUM CHLORIDE 0.9 % IJ SOLN
3.0000 mL | Freq: Two times a day (BID) | INTRAMUSCULAR | Status: DC
  Administered 2014-12-28 – 2015-01-05 (×11): 3 mL via INTRAVENOUS

## 2014-12-27 MED ORDER — ACETAMINOPHEN 325 MG PO TABS: 650.0000 mg | ORAL_TABLET | Freq: Four times a day (QID) | ORAL | Status: DC | PRN

## 2014-12-27 MED ORDER — ZOLPIDEM TARTRATE 5 MG PO TABS: 5.0000 mg | ORAL_TABLET | Freq: Once | ORAL | Status: DC

## 2014-12-27 MED ORDER — DOCUSATE SODIUM 100 MG PO CAPS
100.0000 mg | ORAL_CAPSULE | Freq: Two times a day (BID) | ORAL | Status: DC
  Administered 2014-12-27 – 2015-01-04 (×14): 100 mg via ORAL
  Filled 2014-12-27 (×19): qty 1

## 2014-12-27 MED ORDER — ONDANSETRON HCL 4 MG PO TABS: 4.0000 mg | ORAL_TABLET | Freq: Four times a day (QID) | ORAL | Status: DC | PRN

## 2014-12-27 MED ORDER — ACETAMINOPHEN 650 MG RE SUPP: 650.0000 mg | Freq: Four times a day (QID) | RECTAL | Status: DC | PRN

## 2014-12-27 MED ORDER — CLOPIDOGREL BISULFATE 75 MG PO TABS
75.0000 mg | ORAL_TABLET | Freq: Every day | ORAL | Status: DC
  Administered 2014-12-27 – 2014-12-31 (×5): 75 mg via ORAL
  Filled 2014-12-27 (×5): qty 1

## 2014-12-27 MED ORDER — ROSUVASTATIN CALCIUM 20 MG PO TABS
20.0000 mg | ORAL_TABLET | Freq: Every day | ORAL | Status: DC
  Administered 2014-12-27 – 2015-01-04 (×9): 20 mg via ORAL
  Filled 2014-12-27 (×10): qty 1

## 2014-12-27 NOTE — H&P (Signed)
[facility] History and Physical                                                                                    [name], is a 64 y.o. male  MRN: [id number]   DOB - February 13, 1951  Admit Date - 12/27/2014  Outpatient Primary MD for the patient is Donnie Coffin, MD  Referring MD: Tyrone Nine / ER  Consulting M.D: Bartle/ CVTS  With History of -  Past Medical History  Diagnosis Date  . Hyperlipidemia   . Coronary artery disease   . AAA (abdominal aortic aneurysm)   . PAD (peripheral artery disease)   . COPD (chronic obstructive pulmonary disease)   . Raynaud's disease /phenomenon   . Lumbar degenerative disc disease   . Stroke 10/15/2005  . PONV (postoperative nausea and vomiting)   . Hypertension     takes meds daily  . Anxiety   . GERD (gastroesophageal reflux disease)   . History of blood transfusion   . Hyponatremia       Past Surgical History  Procedure Laterality Date  . Iliac artery stent  10/15/2005     Left common and external   . Cardiac catheterization  2006  . Carotid endarterectomy      bilateral  . Carotid endarterectomy  04/13/2005    Right and 06/03/2005 Left  . Abdominal angiogram  03/30/2012  . Hernia repair    . Abdominal aortagram N/A 03/30/2012    Procedure: ABDOMINAL [name];  Surgeon: Conrad Melissa, MD;  Location: [facility] CATH LAB;  Service: Cardiovascular;  Laterality: N/A;  . Cardiac catheterization N/A 12/09/2014    Procedure: Left Heart Cath and Coronary Angiography;  Surgeon: [name], MD;  Location: [place] CV LAB;  Service: Cardiovascular;  Laterality: N/A;  . Coronary artery bypass graft N/A 12/13/2014    Procedure: CORONARY ARTERY BYPASS GRAFT times five using left internal mammary artery and endoscopic right leg saphenous vein harvest;  Surgeon: Gaye Pollack, MD;  Location: [place] OR;  Service: Open Heart Surgery;  Laterality: N/A;  . Intraoperative transesophageal echocardiogram N/A 12/13/2014    Procedure: INTRAOPERATIVE  TRANSESOPHAGEAL ECHOCARDIOGRAM;  Surgeon: Gaye Pollack, MD;  Location: [facility] OR;  Service: Open Heart Surgery;  Laterality: N/A;    in for   Chief Complaint  Patient presents with  . Fall  . Leg Swelling    bilateral     HPI This is a 64 year old male patient recent non-STEMI and subsequent CABG 5 with vein harvesting from the right lower extremity on 12/13/14 who presents with progressive right lower extremity swelling and discomfort. In addition to the above problem patient also has history significant arteriovascular disease with a complex AAA all by Dr. Sammuel Hines at [facility], prior carotid endarterectomy, known peripheral arterial disease with prior bypass grafting procedures and chronically dopplered dorsalis pedis pulse of the right foot, dyslipidemia, COPD, hypertension, anxiety and GERD. Patient reports that initially post operative from the heart surgery he had an very well and was walking without difficulty but beginning on 8/1 he began developing bilateral lower extremity edema which eventually progressed to being markedly worse on the right lower extremity.  He followed up with the cardiac surgery office on 8/2 and was started on Lasix and potassium for bilateral lower extremity edema. Unfortunately although the swelling decreased somewhat (especially on the left lower extremity with the use of Lasix) the patient continued with significant pain which influenced his mobility. Last night he fell at home and he was on the floor for at least 2 hours before his wife found him. Because of these symptoms he presented to the emergency department.  In the ER the patient was afebrile and otherwise hemodynamically stable. O2 saturations were 100% on room air. Sodium was 130 which is around his baseline, BUN though was elevated at 24 with creatinine of 1.93, glucose 106, albumen 2.8, total protein 6.2 and total bilirubin 1.4. BNP was 568, CK was 43, troponin was 0.0. White count was 7400, hemoglobin  was 7.7 which is lower than it was on the date of discharge noting at that time hemoglobin 9.4. PT 16 I now 1.27 PTT 31, urinalysis was unremarkable. Chest x-ray was unremarkable. Because of significant unilateral swelling EDP ordered venous duplex which revealed extensive acute DVT involving the right distal femoral vein, popliteal vein, posterior tibial veins and gastrocnemius veins. No DVT in the left lower extremity. Patient was started on IV heparin. EDP notified Dr. Cyndia Bent who was in the operating room; he deferred admission to the hospitalist service   Review of Systems   In addition to the HPI above,  No Fever-chills, myalgias or other constitutional symptoms No Headache, changes with Vision or hearing, new weakness, tingling, numbness in any extremity, No problems swallowing food or Liquids, indigestion/reflux No Chest pain, Cough or Shortness of Breath, palpitations, orthopnea or DOE No Abdominal pain, N/V; no melena or hematochezia, no dark tarry stools, Bowel movements are regular, No dysuria, hematuria or flank pain No new skin rashes, lesions, masses or bruises, No new joints pains-aches No recent weight gain or loss No polyuria, polydypsia or polyphagia,  *A full 10 point Review of Systems was done, except as stated above, all other Review of Systems were negative.  Social History History  Substance Use Topics  . Smoking status: Former Smoker    Types: Cigarettes    Quit date: 05/24/2005  . Smokeless tobacco: Never Used  . Alcohol Use: 14.4 oz/week    24 Cans of beer per week     Comment: approximately 24 beer a week    Resides at: Private residence  Lives with: Wife  Ambulatory status: Without assistive devices prior to new swelling right lower extremity with associated pain   Family History Family History  Problem Relation Age of Onset  . Coronary artery disease Father 46    HTN, Hyperlipidemia died  . Heart disease Father     Heart Disease before age 24    . Hypertension Father   . Hyperlipidemia Father   . Peripheral vascular disease Father   . Other Mother 30     died rheumatoid arthritis  . Dementia Mother   . Heart disease Brother   . Hypertension Brother   . Hyperlipidemia Brother      Prior to Admission medications   Medication Sig Start Date End Date Taking? Authorizing Provider  acetaminophen (TYLENOL) 325 MG tablet Take 650 mg by mouth every 6 (six) hours as needed for mild pain or headache.   Yes Historical Provider, MD  aspirin 81 MG tablet Take 81 mg by mouth every evening.    Yes Historical Provider, MD  clopidogrel (PLAVIX) 75 MG  tablet take 1 tablet by mouth once daily Patient taking differently: Take 75mg  by mouth once daily 10/18/14  Yes [name], MD  CRESTOR 20 MG tablet take 1 tablet by mouth once daily at bedtime Patient taking differently: Take 20mg  by mouth at bedtime 07/02/14  Yes [name], MD  furosemide (LASIX) 20 MG tablet Take 2 tablets (40 mg total) by mouth daily. X 10 days 12/24/14 01/03/15 Yes Gaye Pollack, MD  lisinopril (PRINIVIL,ZESTRIL) 5 MG tablet Take 1 tablet (5 mg total) by mouth daily. 12/18/14  Yes Donielle Liston Alba, PA-C  Metoprolol Tartrate 37.5 MG TABS Take 37.5 mg by mouth 2 (two) times daily. 12/17/14  Yes Donielle Liston Alba, PA-C  NITROSTAT 0.4 MG SL tablet Place 0.4 mg under the tongue every 5 (five) minutes as needed. Chest pain 12/04/14  Yes Historical Provider, MD  oxyCODONE (OXY IR/ROXICODONE) 5 MG immediate release tablet Take 1-2 tablets (5-10 mg total) by mouth every 4 (four) hours as needed for severe pain. 12/17/14  Yes Donielle Liston Alba, PA-C  potassium chloride (K-DUR,KLOR-CON) 10 MEQ tablet Take 2 tablets (20 mEq total) by mouth daily. 12/24/14  Yes Gaye Pollack, MD  ranitidine (ZANTAC) 300 MG tablet take 1 tablet by mouth at bedtime Patient taking differently: Take 300mg  by mouth at bedtime 07/30/14  Yes [name], MD  Thiamine HCl (VITAMIN B-1) 100 MG tablet Take  100 mg by mouth daily.     Yes Historical Provider, MD  triamcinolone cream (KENALOG) 0.5 % Apply 1 application topically as needed (for itching).   Yes Historical Provider, MD    Allergies  Allergen Reactions  . Norvasc [Amlodipine Besylate] Swelling       . Pletal [Cilostazol] Diarrhea    Physical Exam  Vitals  Blood pressure 128/64, pulse 85, temperature 98.1 F (36.7 C), temperature source Oral, resp. rate 15, height 5\' 11"  (1.803 m), weight 197 lb (89.359 kg), SpO2 97 %.   General:  In no acute distress, appears stated age in primarily complaining of pain and right lower sternum at the below the knee  Psych:  Normal affect, Denies Suicidal or Homicidal ideations, Awake Alert, Oriented X 3. Speech and thought patterns are clear and appropriate, no apparent short term memory deficits  Neuro:   No focal neurological deficits, CN II through XII intact, Strength 5/5 all 4 extremities, Sensation intact all 4 extremities.  ENT:  Ears and Eyes appear Normal, Conjunctivae clear, PER. Moist oral mucosa without erythema or exudates.  Neck:  Supple, No lymphadenopathy appreciated  Respiratory:  Symmetrical chest wall movement, Good air movement bilaterally, CTAB. Room Air  Cardiac:  RRR, No Murmurs, bilateral LE edema noted: Left lower extremity below the knee 1+ and right lower extremity below the knee 3+, no JVD, No carotid bruits, peripheral pulses palpable at 2+ except for left lower extremity which is 1+ and right lower extremity by Doppler  Abdomen:  Positive bowel sounds, Soft, Non tender, Non distended,  No masses appreciated, no obvious hepatosplenomegaly  Skin:  No Cyanosis, Normal Skin Turgor, No Skin Rash or Bruise.  Extremities: Symmetrical without obvious trauma or injury,  no effusions.  Data Review  CBC  Recent Labs Lab 12/27/14 1028  WBC 7.4  HGB 7.7*  HCT 22.3*  PLT 153  MCV 97.0  MCH 33.5  MCHC 34.5  RDW 13.7    Chemistries   Recent Labs Lab  12/27/14 1028  NA 130*  K 3.9  CL 93*  CO2 25  GLUCOSE 106*  BUN 24*  CREATININE 1.93*  CALCIUM 8.7*  AST 24  ALT 17  ALKPHOS 68  BILITOT 1.4*    estimated creatinine clearance is 40.1 mL/min (by C-G formula based on Cr of 1.93).  No results for input(s): TSH, T4TOTAL, T3FREE, THYROIDAB in the last 72 hours.  Invalid input(s): FREET3  Coagulation profile  Recent Labs Lab 12/27/14 1028  INR 1.27    No results for input(s): DDIMER in the last 72 hours.  Cardiac Enzymes No results for input(s): CKMB, TROPONINI, MYOGLOBIN in the last 168 hours.  Invalid input(s): CK  Invalid input(s): POCBNP  Urinalysis    Component Value Date/Time   COLORURINE YELLOW 12/27/2014 [place] 12/27/2014 1040   LABSPEC 1.008 12/27/2014 1040   PHURINE 6.0 12/27/2014 1040   GLUCOSEU NEGATIVE 12/27/2014 1040   HGBUR NEGATIVE 12/27/2014 1040   BILIRUBINUR NEGATIVE 12/27/2014 1040   KETONESUR NEGATIVE 12/27/2014 1040   PROTEINUR NEGATIVE 12/27/2014 1040   UROBILINOGEN 0.2 12/27/2014 1040   NITRITE NEGATIVE 12/27/2014 1040   LEUKOCYTESUR NEGATIVE 12/27/2014 1040    Imaging results:   Dg Chest 2 View  12/27/2014   CLINICAL DATA:  Pain following fall 1 day prior  EXAM: CHEST  2 VIEW  COMPARISON:  December 15, 2014  FINDINGS: There is no edema or consolidation. Heart size and pulmonary vascularity within normal limits. Patient is status post coronary artery bypass grafting. No adenopathy. No pneumothorax. No acute fracture. There are foci of arterial vascular calcification.  IMPRESSION: No edema or consolidation. No change in cardiac silhouette. No pneumothorax apparent.   Electronically Signed   By: Lowella Grip III M.D.   On: 12/27/2014 11:02   Dg Chest 2 View  12/07/2014   CLINICAL DATA:  Acute on chronic central chest tightness. History of hypertension, AAA, CAD, COPD, stroke, GERD.  EXAM: CHEST  2 VIEW  COMPARISON:  05/29/2012  FINDINGS: Heart size is normal. There is  mild perihilar bronchitic change. Biapical pleural parenchymal changes are stable. There are no focal consolidations or pleural effusions. No pulmonary edema. Visualized osseous structures have a normal appearance.  IMPRESSION: 1. Bronchitic changes. 2.  No focal acute pulmonary abnormality.   Electronically Signed   By: Nolon Nations M.D.   On: 12/07/2014 11:17   Dg Chest Port 1 View  12/15/2014   CLINICAL DATA:  64 year old male post CABG 12/13/2014. Subsequent exam.  EXAM: PORTABLE CHEST - 1 VIEW  COMPARISON:  12/14/2014.  FINDINGS: Swan-Ganz catheter removed. Introducer remains in place projecting at the level of the proximal right internal jugular vein.  Left-sided chest tube and mediastinal drain have been removed. No gross pneumothorax.  Mediastinal drain removed. Post CABG. Cardiomegaly. Calcified minimally tortuous aorta.  Consolidation right lung base may represent elevated right hemidiaphragm although limited for excluding right base atelectasis.  IMPRESSION: Swan-Ganz catheter and left-sided chest tube vessels mediastinal drain removed without evidence gross pneumothorax.  Consolidation right lung base may represent elevated right hemidiaphragm although limited for excluding right base atelectasis.  Post CABG with cardiomegaly.   Electronically Signed   By: Genia Del M.D.   On: 12/15/2014 09:37   Dg Chest Port 1 View  12/14/2014   CLINICAL DATA:  Recent coronary bypass grafting  EXAM: PORTABLE CHEST - 1 VIEW  COMPARISON:  12/13/2014  FINDINGS: Swan-Ganz catheter, mediastinal drain and left thoracostomy catheter are again noted. There is been interval removal of the nasogastric catheter and endotracheal tube. The lungs are well aerated bilaterally  with minimal basilar atelectasis. No focal confluent infiltrate is seen. Overall improved aeration in the left base is noted from the prior exam.  IMPRESSION: Bibasilar atelectasis although overall improved aeration particularly in the left base.   Tubes and lines as described.   Electronically Signed   By: Inez Catalina M.D.   On: 12/14/2014 07:29   Dg Chest Port 1 View  12/13/2014   CLINICAL DATA:  Postop CABG x5. History of hypertension, coronary artery disease, AAA  EXAM: PORTABLE CHEST - 1 VIEW  COMPARISON:  Chest x-ray dated 12/07/2014.  FINDINGS: Status post interval median sternotomy. Endotracheal tube appears adequately positioned with tip at the level of the clavicles. Swan-Ganz catheter in place with tip just to the left of midline. Left-sided chest tube in place with tip directed towards the left lung apex. I believe there is a second left-sided chest tube at the left lung base.  Distal portion of the nasogastric tube is not clearly seen separate from the overlying tubes and lines but I believe the tip of the nasogastric tube is just below the gastroesophageal junction and that the proximal side holes are within the lower esophagus.  There is a probable small left pleural effusion with adjacent atelectasis. There is mild bilateral interstitial edema that may be accentuated by low lung volumes. No pneumothorax seen.  IMPRESSION: 1. Distal tip of the nasogastric tube is not clearly seen. I suspect that it is in the upper stomach and that the proximal side holes of the nasogastric tube are within the distal esophagus. Would consider advancing for optimal radiographic appearance. 2. Left-sided chest tube with tip directed towards the left lung apex. A possible second left-sided chest tube at the left lung base? 3. Probable small left pleural effusion with adjacent atelectasis. 4. Mild bilateral interstitial edema likely accentuated by low lung volumes.  These results will be called to the patient's nurse by the Radiologist Assistant, and communication documented in the PACS or zVision Dashboard.   Electronically Signed   By: Franki Cabot M.D.   On: 12/13/2014 13:53     EKG: (Independently reviewed) sinus rhythm, QTC 445 ms, inverted T waves in  inferolateral leads which are nonspecific and unchanged from previous EKG 7/23   Assessment & Plan  Principal Problem:   Acute DVT  -Admit to telemetry -Continue IV heparin but will hold on longer acting medication such as Coumadin or NOAC until Dr. Bertrum Sol has evaluated patient and determine such medications appropriate 5 days postop CABG -No SCDs -Bedrest with bathroom privileges only initially -Utilize narcotic and nonnarcotic medications for pain  Active Problems:   Anemia -Has worsened postoperatively -Check stools for blood -Check anemia panel -Documented as acute blood loss anemia postoperative during last admission -Current MCV and normocytic range but previously in macrocytic range    Acute renal failure/dehydration with hyponatremia -Seems related to recent addition of Lasix -Holding Lasix -IV fluids -BMET in am    Essential hypertension, benign -Current blood pressure controlled -ACE inhibitor on hold in setting of acute renal failure -Continue beta blocker    CAD/recent NSTEMI/S/P CABG x 5 (12/13/14) -Dr. Cyndia Bent notified by EDP of patient admission and reason for admission -Await formal consultation and recommendations regarding chronic anticoagulation -Continue beta blocker, Plavix and baby aspirin for now-may need to stop baby aspirin depending on type of long-acting anticoagulation chosen    Hyperlipidemia  -Continue Crestor    PAD  -Stable and pulses at baseline according to family and patient  AAA  -Very complex and due to degree of calcification has been deemed not electively operable -Follow[facility] at [facility]    DVT Prophylaxis: Full dose IV heparin  Family Communication:   Wife at bedside  Code Status:  Full code  Condition:  Stable  Discharge disposition: Anticipate discharge back to home with wife  Time spent in minutes : 60      Takiya Belmares L. ANP on 12/27/2014 at 4:08 PM  Between 7am to 7pm - Pager -  [phone]  After 7pm go to [email or web address] - password [email or web address]  And look for the night coverage person covering me after hours  [facility]

## 2014-12-27 NOTE — ED Notes (Addendum)
EMS - Patient coming from home with c/o of fall last night.  Patients wife states patient told her it took him about 2 hours to get up from the bathroom.  Patient denies LOC.  Wife gave patient 2 Oxycodone last night around 10 pm and patient fell around 2am.  Patient was c/o of pain this am and given 1 oxycodone this am around 06:00.  Patient uses a walker to ambulate.  Patient has bilateral leg swelling that has worsened in the past couple of days.  CABG x 3 weeks ago.  135/67 BP, 86 normal sinus, and 96% room air.

## 2014-12-27 NOTE — Progress Notes (Signed)
ANTICOAGULATION CONSULT NOTE - Initial Consult  Pharmacy Consult for Heparin Indication: DVT  Allergies  Allergen Reactions  . Norvasc [Amlodipine Besylate] Swelling       . Pletal [Cilostazol] Diarrhea    Patient Measurements: Height: 5\' 11"  (180.3 cm) Weight: 197 lb (89.359 kg) IBW/kg (Calculated) : 75.3 Heparin Dosing Weight:  89.4 kg  Vital Signs: Temp: 98.1 F (36.7 C) (08/05 0951) Temp Source: Oral (08/05 0951) BP: 125/62 mmHg (08/05 1309) Pulse Rate: 87 (08/05 1309)  Labs:  Recent Labs  12/27/14 1028  HGB 7.7*  HCT 22.3*  PLT 153  LABPROT 16.0*  INR 1.27  CREATININE 1.93*  CKTOTAL 43*    Estimated Creatinine Clearance: 40.1 mL/min (by C-G formula based on Cr of 1.93).   Medical History: Past Medical History  Diagnosis Date  . Hyperlipidemia   . Coronary artery disease   . AAA (abdominal aortic aneurysm)   . PAD (peripheral artery disease)   . COPD (chronic obstructive pulmonary disease)   . Raynaud's disease /phenomenon   . Lumbar degenerative disc disease   . Stroke 10/15/2005  . PONV (postoperative nausea and vomiting)   . Hypertension     takes meds daily  . Anxiety   . GERD (gastroesophageal reflux disease)   . History of blood transfusion   . Hyponatremia     Medications:  See med rec  Assessment: 64 y/o M s/p CABG 3 wks ago presents s/p fall last PM and with B leg swelling/pain. Doppler + for R DVT.  Abnormal labs: Na 130, Scr elevated 1.93, Tbili 1.4, BNP 568, Hgb 7.7  Goal of Therapy:  Heparin level 0.3-0.7 units/ml Monitor platelets by anticoagulation protocol: Yes   Plan:  Heparin 4000 unit IV bolus Heparin 1450 units/hr Heparin level in 6-8 hrs Daily heparin level and CBC   Srihan Brutus S. Alford Highland, PharmD, BCPS Clinical Staff Pharmacist Pager [phone]  Eilene Ghazi Stillinger 12/27/2014,1:39 PM

## 2014-12-27 NOTE — ED Provider Notes (Signed)
CSN: [id number]     Arrival date & time 12/27/14  0942 History   First MD Initiated Contact with Patient 12/27/14 [phone]     Chief Complaint  Patient presents with  . Fall  . Leg Swelling    bilateral     (Consider location/radiation/quality/duration/timing/severity/associated sxs/prior Treatment) Patient is a 64 y.o. male presenting with fall and leg pain. The history is provided by the patient.  Fall This is a new problem. The current episode started yesterday. The problem occurs rarely. The problem has been resolved. Associated symptoms include chest pain. Pertinent negatives include no abdominal pain, no headaches and no shortness of breath. Nothing aggravates the symptoms. Nothing relieves the symptoms. He has tried nothing for the symptoms. The treatment provided no relief.  Leg Pain Location:  Leg Time since incident:  3 weeks Leg location:  L leg and R leg Pain details:    Quality:  Aching and sharp   Radiates to:  Does not radiate   Severity:  Severe   Onset quality:  Gradual   Duration:  3 weeks   Timing:  Constant   Progression:  Worsening Chronicity:  New Dislocation: no   Prior injury to area: Recent CABG 3 weeks ago. Relieved by:  Nothing Worsened by:  Nothing tried Ineffective treatments:  None tried Associated symptoms: stiffness and swelling   Associated symptoms: no fatigue and no fever    64 yo M with a chief complaint of fall. Patient states this occurred last night. Patient thinks that he slipped and then fell and hit the ground. Patient denies any loss of consciousness though is amnestic to the actual event. Patient was unable to get up off the ground for approximately 2 hours. Was eventually able to get up himself. Thinks the reason he fell was due to his bilateral leg pain. Had a CABG 3 weeks ago since then has had progressive swelling and pain to bilateral lower extremities right greater than left. Denies any fevers or chills.  Past Medical History   Diagnosis Date  . Hyperlipidemia   . Coronary artery disease   . AAA (abdominal aortic aneurysm)   . PAD (peripheral artery disease)   . COPD (chronic obstructive pulmonary disease)   . Raynaud's disease /phenomenon   . Lumbar degenerative disc disease   . Stroke 10/15/2005  . PONV (postoperative nausea and vomiting)   . Hypertension     takes meds daily  . Anxiety   . GERD (gastroesophageal reflux disease)   . History of blood transfusion   . Hyponatremia    Past Surgical History  Procedure Laterality Date  . Iliac artery stent  10/15/2005     Left common and external   . Cardiac catheterization  2006  . Carotid endarterectomy      bilateral  . Carotid endarterectomy  04/13/2005    Right and 06/03/2005 Left  . Abdominal angiogram  03/30/2012  . Hernia repair    . Abdominal aortagram N/A 03/30/2012    Procedure: ABDOMINAL Maxcine Ham;  Surgeon: Conrad Claxton, MD;  Location: [facility] CATH LAB;  Service: Cardiovascular;  Laterality: N/A;  . Cardiac catheterization N/A 12/09/2014    Procedure: Left Heart Cath and Coronary Angiography;  Surgeon: [name], MD;  Location: [place] CV LAB;  Service: Cardiovascular;  Laterality: N/A;  . Coronary artery bypass graft N/A 12/13/2014    Procedure: CORONARY ARTERY BYPASS GRAFT times five using left internal mammary artery and endoscopic right leg saphenous vein harvest;  Surgeon: Gaspar Bidding  Alveria Apley, MD;  Location: [place];  Service: Open Heart Surgery;  Laterality: N/A;  . Intraoperative transesophageal echocardiogram N/A 12/13/2014    Procedure: INTRAOPERATIVE TRANSESOPHAGEAL ECHOCARDIOGRAM;  Surgeon: Gaye Pollack, MD;  Location: [facility] OR;  Service: Open Heart Surgery;  Laterality: N/A;   Family History  Problem Relation Age of Onset  . Coronary artery disease Father 35    HTN, Hyperlipidemia died  . Heart disease Father     Heart Disease before age 62  . Hypertension Father   . Hyperlipidemia Father   . Peripheral vascular disease Father   .  Other Mother 39     died rheumatoid arthritis  . Dementia Mother   . Heart disease Brother   . Hypertension Brother   . Hyperlipidemia Brother    History  Substance Use Topics  . Smoking status: Former Smoker    Types: Cigarettes    Quit date: 05/24/2005  . Smokeless tobacco: Never Used  . Alcohol Use: 14.4 oz/week    24 Cans of beer per week     Comment: approximately 24 beer a week    Review of Systems  Constitutional: Negative for fever, chills and fatigue.  HENT: Negative for congestion and facial swelling.   Eyes: Negative for discharge and visual disturbance.  Respiratory: Negative for shortness of breath.   Cardiovascular: Positive for chest pain. Negative for palpitations.  Gastrointestinal: Negative for vomiting, abdominal pain and diarrhea.  Musculoskeletal: Positive for myalgias, arthralgias and stiffness.  Skin: Negative for color change and rash.  Neurological: Negative for tremors, syncope and headaches.  Psychiatric/Behavioral: Negative for confusion and dysphoric mood.      Allergies  Norvasc and Pletal  Home Medications   Prior to Admission medications   Medication Sig Start Date End Date Taking? Authorizing Provider  acetaminophen (TYLENOL) 325 MG tablet Take 650 mg by mouth every 6 (six) hours as needed for mild pain or headache.   Yes Historical Provider, MD  aspirin 81 MG tablet Take 81 mg by mouth every evening.    Yes Historical Provider, MD  clopidogrel (PLAVIX) 75 MG tablet take 1 tablet by mouth once daily Patient taking differently: Take 75mg  by mouth once daily 10/18/14  Yes [name], MD  CRESTOR 20 MG tablet take 1 tablet by mouth once daily at bedtime Patient taking differently: Take 20mg  by mouth at bedtime 07/02/14  Yes [name], MD  furosemide (LASIX) 20 MG tablet Take 2 tablets (40 mg total) by mouth daily. X 10 days 12/24/14 01/03/15 Yes Gaye Pollack, MD  lisinopril (PRINIVIL,ZESTRIL) 5 MG tablet Take 1 tablet (5 mg total) by  mouth daily. 12/18/14  Yes Donielle Liston Alba, PA-C  Metoprolol Tartrate 37.5 MG TABS Take 37.5 mg by mouth 2 (two) times daily. 12/17/14  Yes Donielle Liston Alba, PA-C  NITROSTAT 0.4 MG SL tablet Place 0.4 mg under the tongue every 5 (five) minutes as needed. Chest pain 12/04/14  Yes Historical Provider, MD  oxyCODONE (OXY IR/ROXICODONE) 5 MG immediate release tablet Take 1-2 tablets (5-10 mg total) by mouth every 4 (four) hours as needed for severe pain. 12/17/14  Yes Donielle Liston Alba, PA-C  potassium chloride (K-DUR,KLOR-CON) 10 MEQ tablet Take 2 tablets (20 mEq total) by mouth daily. 12/24/14  Yes Gaye Pollack, MD  ranitidine (ZANTAC) 300 MG tablet take 1 tablet by mouth at bedtime Patient taking differently: Take 300mg  by mouth at bedtime 07/30/14  Yes [name], MD  Thiamine HCl (VITAMIN B-1) 100 MG  tablet Take 100 mg by mouth daily.     Yes Historical Provider, MD  triamcinolone cream (KENALOG) 0.5 % Apply 1 application topically as needed (for itching).   Yes Historical Provider, MD   BP 125/63 mmHg  Pulse 81  Temp(Src) 98.1 F (36.7 C) (Oral)  Resp 12  Ht 5\' 11"  (1.803 m)  Wt 197 lb (89.359 kg)  BMI 27.49 kg/m2  SpO2 96% Physical Exam  Constitutional: He is oriented to person, place, and time. He appears well-developed and well-nourished.  HENT:  Head: Normocephalic and atraumatic.  Eyes: EOM are normal. Pupils are equal, round, and reactive to light.  Neck: Normal range of motion. Neck supple. No JVD present.  Cardiovascular: Normal rate and regular rhythm.  Exam reveals no gallop and no friction rub.   No murmur heard. Pulmonary/Chest: No respiratory distress. He has no wheezes. He exhibits tenderness (CABG scar with mild erythema and tender to palpation no noted drainage.).  JVD halfway up the neck.  Abdominal: He exhibits no distension. There is no rebound and no guarding.  Musculoskeletal: Normal range of motion. He exhibits edema and tenderness.  Marked edema to  bilateral lower extremities. Mild erythema to the right lower extremity. Swelling greater in the right lower extremity compared to the left. 4+ pitting edema up to the thigh. Incisions with mild erythema to the right lower extremity.  Neurological: He is alert and oriented to person, place, and time.  Skin: No rash noted. No pallor.  Psychiatric: He has a normal mood and affect. His behavior is normal.    ED Course  Procedures (including critical care time) Labs Review Labs Reviewed  CBC - Abnormal; Notable for the following:    RBC 2.30 (*)    Hemoglobin 7.7 (*)    HCT 22.3 (*)    All other components within normal limits  PROTIME-INR - Abnormal; Notable for the following:    Prothrombin Time 16.0 (*)    All other components within normal limits  COMPREHENSIVE METABOLIC PANEL - Abnormal; Notable for the following:    Sodium 130 (*)    Chloride 93 (*)    Glucose, Bld 106 (*)    BUN 24 (*)    Creatinine, Ser 1.93 (*)    Calcium 8.7 (*)    Total Protein 6.2 (*)    Albumin 2.8 (*)    Total Bilirubin 1.4 (*)    GFR calc non Af Amer 35 (*)    GFR calc Af Amer 40 (*)    All other components within normal limits  BRAIN NATRIURETIC PEPTIDE - Abnormal; Notable for the following:    B Natriuretic Peptide 568.1 (*)    All other components within normal limits  CK - Abnormal; Notable for the following:    Total CK 43 (*)    All other components within normal limits  URINALYSIS, ROUTINE W REFLEX MICROSCOPIC (NOT AT [facility])  APTT  HEPARIN LEVEL (UNFRACTIONATED)  I-STAT TROPOININ, ED    Imaging Review Dg Chest 2 View  12/27/2014   CLINICAL DATA:  Pain following fall 1 day prior  EXAM: CHEST  2 VIEW  COMPARISON:  December 15, 2014  FINDINGS: There is no edema or consolidation. Heart size and pulmonary vascularity within normal limits. Patient is status post coronary artery bypass grafting. No adenopathy. No pneumothorax. No acute fracture. There are foci of arterial vascular calcification.   IMPRESSION: No edema or consolidation. No change in cardiac silhouette. No pneumothorax apparent.   Electronically Signed  By: Lowella Grip III M.D.   On: 12/27/2014 11:02     EKG Interpretation None      MDM   Final diagnoses:  Leg pain  Acute DVT (deep venous thrombosis), right  AKI (acute kidney injury)  Anemia, unspecified anemia type    64 yo M with recent CABG comes in with a chief complaint of bilateral lower extremity pain and a fall. Patient unable to walk or bear weight at home. Obtain bilateral lower extremity DVT studies BNP chest x-ray CBC troponin CMP.  Patient found to have a right lower extremity DVT. Also found to have acute kidney injury. Mild decrease in his hemoglobin. Notified his cardiothoracic surgeon. Will admit to the hospitalist. The patients results and plan were reviewed and discussed.   Any x-rays performed were independently reviewed by myself.   Differential diagnosis were considered with the presenting HPI.  Medications  heparin ADULT infusion 100 units/mL (25000 units/250 mL) (1,450 Units/hr Intravenous New Bag/Given 12/27/14 1449)  0.9 %  sodium chloride infusion ( Intravenous New Bag/Given 12/27/14 1446)  HYDROmorphone (DILAUDID) injection 1 mg (1 mg Intravenous Given 12/27/14 1113)  ondansetron (ZOFRAN) injection 4 mg (4 mg Intravenous Given 12/27/14 1111)  heparin bolus via infusion 4,000 Units (4,000 Units Intravenous Given 12/27/14 1447)    Filed Vitals:   12/27/14 1400 12/27/14 1415 12/27/14 1430 12/27/14 1445  BP: 120/65 102/58 101/69 125/63  Pulse: 85 76 79 81  Temp:      TempSrc:      Resp: 19 11 11 12   Height:      Weight:      SpO2: 95% 96% 95% 96%    Final diagnoses:  Leg pain  Acute DVT (deep venous thrombosis), right  AKI (acute kidney injury)  Anemia, unspecified anemia type    Admission/ observation were discussed with the admitting physician, patient and/or family and they are comfortable with the plan.    Deno Etienne, DO 12/27/14 [phone]

## 2014-12-27 NOTE — ED Notes (Signed)
Admitting at bedside with the patient.

## 2014-12-27 NOTE — Progress Notes (Addendum)
VASCULAR LAB PRELIMINARY  PRELIMINARY  PRELIMINARY  PRELIMINARY  Bilateral lower extremity venous duplex completed. Positive for acute deep vein thrombosis involving the right distal femoral vein, popliteal vein, posterior tibial veins, and gastro  veins. No evidence of DVT or SVT in the left lower extremity. Results given to Dr. Deno Etienne.  Alla German, RVT 12/27/2014, 1:06 PM

## 2014-12-27 NOTE — Telephone Encounter (Signed)
Mrs. Sprott called back on Thursday 12/26/14 and I made an apt for him to see Dr. Cyndia Bent on Friday at 11 am.  This was done with much encouragement as he has difficulty walking due to the pain/swelling.  I said we had wheelchairs in the entrance of the building. She left a message Friday morning with the answering service to cancel the appointment with Dr. Cyndia Bent.  I called her and she said due to a fall at some ?time she called EMS to take him to the ER.

## 2014-12-28 DIAGNOSIS — I714 Abdominal aortic aneurysm, without rupture: Secondary | ICD-10-CM

## 2014-12-28 DIAGNOSIS — I82401 Acute embolism and thrombosis of unspecified deep veins of right lower extremity: Secondary | ICD-10-CM

## 2014-12-28 DIAGNOSIS — I82409 Acute embolism and thrombosis of unspecified deep veins of unspecified lower extremity: Secondary | ICD-10-CM

## 2014-12-28 DIAGNOSIS — I10 Essential (primary) hypertension: Secondary | ICD-10-CM

## 2014-12-28 DIAGNOSIS — N179 Acute kidney failure, unspecified: Secondary | ICD-10-CM

## 2014-12-28 DIAGNOSIS — E785 Hyperlipidemia, unspecified: Secondary | ICD-10-CM

## 2014-12-28 LAB — CBC
HEMATOCRIT: 19.5 % — AB (ref 39.0–52.0)
HEMOGLOBIN: 6.7 g/dL — AB (ref 13.0–17.0)
MCH: 34 pg (ref 26.0–34.0)
MCHC: 34.4 g/dL (ref 30.0–36.0)
MCV: 99 fL (ref 78.0–100.0)
Platelets: 134 10*3/uL — ABNORMAL LOW (ref 150–400)
RBC: 1.97 MIL/uL — ABNORMAL LOW (ref 4.22–5.81)
RDW: 13.6 % (ref 11.5–15.5)
WBC: 5.8 10*3/uL (ref 4.0–10.5)

## 2014-12-28 LAB — BASIC METABOLIC PANEL
Anion gap: 9 (ref 5–15)
BUN: 19 mg/dL (ref 6–20)
CO2: 26 mmol/L (ref 22–32)
Calcium: 8.1 mg/dL — ABNORMAL LOW (ref 8.9–10.3)
Chloride: 96 mmol/L — ABNORMAL LOW (ref 101–111)
Creatinine, Ser: 1.59 mg/dL — ABNORMAL HIGH (ref 0.61–1.24)
GFR calc non Af Amer: 44 mL/min — ABNORMAL LOW (ref >60)
GFR, EST AFRICAN AMERICAN: 51 mL/min — AB (ref >60)
GLUCOSE: 98 mg/dL (ref 65–99)
POTASSIUM: 4.3 mmol/L (ref 3.5–5.1)
SODIUM: 131 mmol/L — AB (ref 135–145)

## 2014-12-28 LAB — PROTIME-INR
INR: 1.44 (ref 0.00–1.49)
Prothrombin Time: 17.6 seconds — ABNORMAL HIGH (ref 11.6–15.2)

## 2014-12-28 LAB — HEMOGLOBIN AND HEMATOCRIT, BLOOD
HCT: 25.5 % — ABNORMAL LOW (ref 39.0–52.0)
Hemoglobin: 8.7 g/dL — ABNORMAL LOW (ref 13.0–17.0)

## 2014-12-28 LAB — HEPARIN LEVEL (UNFRACTIONATED)
HEPARIN UNFRACTIONATED: 0.55 [IU]/mL (ref 0.30–0.70)
HEPARIN UNFRACTIONATED: 0.53 [IU]/mL (ref 0.30–0.70)

## 2014-12-28 LAB — PREPARE RBC (CROSSMATCH)

## 2014-12-28 LAB — APTT: aPTT: 143 seconds — ABNORMAL HIGH (ref 24–37)

## 2014-12-28 MED ORDER — SODIUM CHLORIDE 0.9 % IV SOLN
Freq: Once | INTRAVENOUS | Status: AC
  Administered 2014-12-28: 08:00:00 via INTRAVENOUS

## 2014-12-28 NOTE — Progress Notes (Signed)
ANTICOAGULATION CONSULT NOTE - Follow Up Consult  Pharmacy Consult for heparin Indication: DVT  Allergies  Allergen Reactions  . Norvasc [Amlodipine Besylate] Swelling       . Pletal [Cilostazol] Diarrhea    Patient Measurements: Height: 5\' 11"  (180.3 cm) Weight: 191 lb 9.6 oz (86.909 kg) IBW/kg (Calculated) : 75.3  Vital Signs: Temp: 98.5 F (36.9 C) (08/06 0434) Temp Source: Oral (08/06 0434) BP: 132/52 mmHg (08/06 0954) Pulse Rate: 75 (08/06 0954)  Labs:  Recent Labs  12/27/14 1028 12/28/14 0016 12/28/14 0348  HGB 7.7*  --  6.7*  HCT 22.3*  --  19.5*  PLT 153  --  134*  APTT 31  --  143*  LABPROT 16.0*  --  17.6*  INR 1.27  --  1.44  HEPARINUNFRC  --  0.55 0.53  CREATININE 1.93*  --  1.59*  CKTOTAL 43*  --   --     Estimated Creatinine Clearance: 48.7 mL/min (by C-G formula based on Cr of 1.59).  Assessment: 21 YOM s/p CABG 3 weeks ago, readmitted with new DVT, AKI, and worsening anemia, currently on IV heparin, heparin level therapeutic x 2 on 1450 units/hr. hgb dropped to 6.7 this morning, checking FOB. No bleeding noted per chart.    Goal of Therapy:  Heparin level 0.3-0.7 units/ml Monitor platelets by anticoagulation protocol: Yes   Plan:  - Continue heparin infusion 1450 units/hr  - Monitor CBC, s/sx of bleeding closely - f/u plans for oral anticoagulation   [name], PharmD, BCPS  Clinical Pharmacist  Pager: [phone]   12/28/2014,10:37 AM

## 2014-12-28 NOTE — Progress Notes (Signed)
[facility] PROGRESS NOTE  [name] [id number] DOB: Jun 28, 1950 DOA: 12/27/2014 PCP: Donnie Coffin, MD  Assessment/Plan:  Acute DVT  -Thus far continued on IV heparin. Appreciate CT surg input. Recs for factor Xa inhibitor x 6 mos. Will discuss with SW regarding most affordable option for pt before choosing a regimen -Bedrest with bathroom privileges only initially -Utilize narcotic and nonnarcotic medications for pain  Active Problems:  Anemia -Has worsened postoperatively -Checingk stools for blood -Documented as acute blood loss anemia postoperative during last admission -Current MCV and normocytic range but previously in macrocytic range -Transfuse 2 units   Acute renal failure/dehydration with hyponatremia -Seems related to recent addition of Lasix -Holding Lasix -cont IV fluids   Essential hypertension, benign -Current blood pressure controlled -ACE inhibitor on hold in setting of acute renal failure -Continue beta blocker   CAD/recent NSTEMI/S/P CABG x 5 (12/13/14) -Pt well known to Dr. Cyndia Bent. Appreciate input -Continue beta blocker, Plavix and baby aspirin for now-may need to stop baby aspirin depending on type of long-acting anticoagulation chosen   Hyperlipidemia  -Continue Crestor   PAD  -Stable and pulses at baseline according to family and patient   AAA  -Very complex and due to degree of calcification has been deemed not electively operable -Followed by Dr. Sammuel Hines at [place] Status: Full Family Communication: Pt in room (indicate person spoken with, relationship, and if by phone, the number) Disposition Plan: Pending   Consultants:  CT Surgery  Procedures:    Antibiotics:    HPI/Subjective: Reports feeling chills. No chest pain.   Objective: Filed Vitals:   12/28/14 0954 12/28/14 1456 12/28/14 1539 12/28/14 1559  BP: 132/52 139/59 134/57 138/57  Pulse: 75 74 80 85  Temp:  98 F (36.7 C) 98.8 F (37.1  C) 99 F (37.2 C)  TempSrc:  Oral Oral Oral  Resp:  18 18 18   Height:      Weight:      SpO2:  99% 98% 100%    Intake/Output Summary (Last 24 hours) at 12/28/14 1702 Last data filed at 12/28/14 1559  Gross per 24 hour  Intake   1055 ml  Output   1100 ml  Net    -45 ml   Filed Weights   12/27/14 0951 12/27/14 1621 12/28/14 0434  Weight: 89.359 kg (197 lb) 87.952 kg (193 lb 14.4 oz) 86.909 kg (191 lb 9.6 oz)    Exam:   General:  Awake, in nad  Cardiovascular: regular, s1, s2  Respiratory: normal resp effort, no wheezing  Abdomen: soft,nondistended  Musculoskeletal: perfused, no clubbing   Data Reviewed: Basic Metabolic Panel:  Recent Labs Lab 12/27/14 1028 12/28/14 0348  NA 130* 131*  K 3.9 4.3  CL 93* 96*  CO2 25 26  GLUCOSE 106* 98  BUN 24* 19  CREATININE 1.93* 1.59*  CALCIUM 8.7* 8.1*   Liver Function Tests:  Recent Labs Lab 12/27/14 1028  AST 24  ALT 17  ALKPHOS 68  BILITOT 1.4*  PROT 6.2*  ALBUMIN 2.8*   No results for input(s): LIPASE, AMYLASE in the last 168 hours. No results for input(s): AMMONIA in the last 168 hours. CBC:  Recent Labs Lab 12/27/14 1028 12/28/14 0348  WBC 7.4 5.8  HGB 7.7* 6.7*  HCT 22.3* 19.5*  MCV 97.0 99.0  PLT 153 134*   Cardiac Enzymes:  Recent Labs Lab 12/27/14 1028  CKTOTAL 43*   BNP (last 3 results)  Recent Labs  12/27/14 1028  BNP 568.1*    ProBNP (last 3 results)  Recent Labs  11/08/14 1544  PROBNP 179.0*    CBG: No results for input(s): GLUCAP in the last 168 hours.  No results found for this or any previous visit (from the past 240 hour(s)).   Studies: Dg Chest 2 View  12/27/2014   CLINICAL DATA:  Pain following fall 1 day prior  EXAM: CHEST  2 VIEW  COMPARISON:  December 15, 2014  FINDINGS: There is no edema or consolidation. Heart size and pulmonary vascularity within normal limits. Patient is status post coronary artery bypass grafting. No adenopathy. No pneumothorax. No acute  fracture. There are foci of arterial vascular calcification.  IMPRESSION: No edema or consolidation. No change in cardiac silhouette. No pneumothorax apparent.   Electronically Signed   By: Lowella Grip III M.D.   On: 12/27/2014 11:02    Scheduled Meds: . aspirin EC  81 mg Oral QPM  . clopidogrel  75 mg Oral Daily  . docusate sodium  100 mg Oral BID  . famotidine  20 mg Oral BID  . metoprolol tartrate  37.5 mg Oral BID  . rosuvastatin  20 mg Oral QHS  . sodium chloride  3 mL Intravenous Q12H  . zolpidem  5 mg Oral Once   Continuous Infusions: . sodium chloride 75 mL/hr at 12/27/14 1446  . heparin 1,450 Units/hr (12/27/14 1449)    Principal Problem:   Acute DVT (deep venous thrombosis) Active Problems:   Essential hypertension, benign   Hyperlipidemia with target LDL less than 70   PAD (peripheral artery disease)   AAA (abdominal aortic aneurysm)   CAD (coronary artery disease)   S/P CABG x 5 12/13/14   Anemia   Acute renal failure   AKI (acute kidney injury)   Absolute anemia    [name] K  [facility] Pager [phone]. If 7PM-7AM, please contact night-coverage at [email or web address], password [facility] 12/28/2014, 5:02 PM  LOS: 1 day

## 2014-12-28 NOTE — Progress Notes (Signed)
ANTICOAGULATION CONSULT NOTE - Follow Up Consult  Pharmacy Consult for Heparin  Indication: DVT  Allergies  Allergen Reactions  . Norvasc [Amlodipine Besylate] Swelling       . Pletal [Cilostazol] Diarrhea    Patient Measurements: Height: 5\' 11"  (180.3 cm) Weight: 193 lb 14.4 oz (87.952 kg) IBW/kg (Calculated) : 75.3  Vital Signs: Temp: 100.1 F (37.8 C) (08/05 2154) Temp Source: Oral (08/05 2154) BP: 115/57 mmHg (08/05 2154) Pulse Rate: 89 (08/05 2154)  Labs:  Recent Labs  12/27/14 1028 12/28/14 0016  HGB 7.7*  --   HCT 22.3*  --   PLT 153  --   APTT 31  --   LABPROT 16.0*  --   INR 1.27  --   HEPARINUNFRC  --  0.55  CREATININE 1.93*  --   CKTOTAL 43*  --     Estimated Creatinine Clearance: 40.1 mL/min (by C-G formula based on Cr of 1.93).    Assessment: Initial heparin level is therapeutic   Goal of Therapy:  Heparin level 0.3-0.7 units/ml Monitor platelets by anticoagulation protocol: Yes   Plan:  -Continue heparin at 1450 units/hr -Confirmatory HL with AM labs -Daily CBC/HL -Monitor for bleeding -F/U oral anti-coagulation plans  Narda Bonds 12/28/2014,12:59 AM

## 2014-12-28 NOTE — Progress Notes (Signed)
Utilization review completed.  

## 2014-12-28 NOTE — Progress Notes (Signed)
Subjective:  Patient well-known to me from recent CABG surgery. He had left endoscopic saphenous vein harvest. He has postop lovenox DVT prophylaxis and SCD's. He had an uneventful course and went home. After a few days at home he developed edema in both legs but says it was slightly greater in the right leg. He was seen by our office nurse when he came in for suture removal and started on lasix. The left leg edema decreased but the right leg did not and then suddenly became painful and he could not walk on it. He felt overnight and came in by EMS because he could not get up on his leg. Duplex showed right distal femoral, popliteal and posterior tibial and gastroc vein DVT. He has been started on heparin and right leg swelling has markedly decreased. His leg is still painful and he says he can't stand on it. Creat was also elevated on admission probably due to dehydration from poor po intake, lasix and lisinopril. This is improving. He was also anemic. Hgb was 9.4 on discharge, 7.7 on admission yesterday, and 6.7 today. He is to get a transfusion. He has not had a BM in 5 days. No visible blood loss.   Objective: Vital signs in last 24 hours: Temp:  [98.5 F (36.9 C)-100.1 F (37.8 C)] 98.5 F (36.9 C) (08/06 0434) Pulse Rate:  [71-89] 75 (08/06 0954) Cardiac Rhythm:  [-] Normal sinus rhythm (08/06 0814) Resp:  [10-18] 18 (08/06 0434) BP: (110-139)/(50-64) 132/52 mmHg (08/06 0954) SpO2:  [94 %-98 %] 96 % (08/06 0434) Weight:  [86.909 kg (191 lb 9.6 oz)-87.952 kg (193 lb 14.4 oz)] 86.909 kg (191 lb 9.6 oz) (08/06 0434)  Hemodynamic parameters for last 24 hours:    Intake/Output from previous day: 08/05 0701 - 08/06 0700 In: 240 [P.O.:240] Out: 450 [Urine:450] Intake/Output this shift: Total I/O In: 240 [P.O.:240] Out: 350 [Urine:350]  General appearance: alert and cooperative Neurologic: intact Heart: regular rate and rhythm, S1, S2 normal, no murmur, click, rub or gallop Lungs:  clear to auscultation bilaterally Abdomen: soft, non-tender; bowel sounds normal; no masses,  no organomegaly Extremities: edema mild in the right leg, no significant tenderness. Wound: incisions healing well  Lab Results:  Recent Labs  12/27/14 1028 12/28/14 0348  WBC 7.4 5.8  HGB 7.7* 6.7*  HCT 22.3* 19.5*  PLT 153 134*   BMET:  Recent Labs  12/27/14 1028 12/28/14 0348  NA 130* 131*  K 3.9 4.3  CL 93* 96*  CO2 25 26  GLUCOSE 106* 98  BUN 24* 19  CREATININE 1.93* 1.59*  CALCIUM 8.7* 8.1*    PT/INR:  Recent Labs  12/28/14 0348  LABPROT 17.6*  INR 1.44   ABG    Component Value Date/Time   PHART 7.410 12/13/2014 1816   HCO3 21.9 12/13/2014 1816   TCO2 23 12/14/2014 1638   ACIDBASEDEF 2.0 12/13/2014 1816   O2SAT 94.0 12/13/2014 1816   CBG (last 3)  No results for input(s): GLUCAP in the last 72 hours.  Assessment/Plan:   RLE DVT s/p CABG on 12/13/2014.  He is on heparin now and can be converted to a factor Xa inhibitor for at least 6 months. Eliquis 10 mg bid for 7 days followed by 5 mg bid or Xarelto 15 mg bid for 3 weeks followed by 20 mg daily. He could also be treated with Pradaxa. Heparin can be stopped as soon as one of these is started. I don't think there is any benefit  of one over the other but cost and insurance coverage is frequently an issue. Coumadin is cheaper but more labor intensive and not as effective. I will be happy to follow him for this or it can be done by cardiology. His progressive anemia is of concern because I would not expect him to have such a drop in Hgb since discharge. Usually the Hgb is improving since discharge. There has been no visible GI blood loss.  LOS: 1 day    Gaye Pollack 12/28/2014

## 2014-12-28 NOTE — Progress Notes (Signed)
CRITICAL VALUE ALERT  Critical value received:  Hgb 6.7  Date of notification:  12/28/2014  Time of notification:  0650  Critical value read back: yes  Nurse who received alert:  Arnell Sieving   MD notified (1st page):  Donnal Debar, NP  Time of first page:  [phone]  MD notified (2nd page):  Time of second page:  Responding MD:  Donnal Debar, NP  Time MD responded:  [phone]

## 2014-12-29 DIAGNOSIS — D520 Dietary folate deficiency anemia: Secondary | ICD-10-CM

## 2014-12-29 LAB — TYPE AND SCREEN
ABO/RH(D): B POS
ANTIBODY SCREEN: NEGATIVE
Unit division: 0
Unit division: 0

## 2014-12-29 LAB — CBC
HEMATOCRIT: 24.6 % — AB (ref 39.0–52.0)
HEMOGLOBIN: 8.5 g/dL — AB (ref 13.0–17.0)
MCH: 32.1 pg (ref 26.0–34.0)
MCHC: 34.6 g/dL (ref 30.0–36.0)
MCV: 92.8 fL (ref 78.0–100.0)
PLATELETS: 108 10*3/uL — AB (ref 150–400)
RBC: 2.65 MIL/uL — AB (ref 4.22–5.81)
RDW: 16.8 % — AB (ref 11.5–15.5)
WBC: 6.9 10*3/uL (ref 4.0–10.5)

## 2014-12-29 LAB — HEPARIN LEVEL (UNFRACTIONATED): Heparin Unfractionated: 0.51 IU/mL (ref 0.30–0.70)

## 2014-12-29 MED ORDER — SORBITOL 70 % SOLN
30.0000 mL | Status: AC
Start: 2014-12-29 — End: 2014-12-29
  Administered 2014-12-29: 30 mL via ORAL
  Filled 2014-12-29: qty 30

## 2014-12-29 NOTE — Progress Notes (Signed)
ANTICOAGULATION CONSULT NOTE - Follow Up Consult  Pharmacy Consult for heparin Indication: DVT  Allergies  Allergen Reactions  . Norvasc [Amlodipine Besylate] Swelling       . Pletal [Cilostazol] Diarrhea    Patient Measurements: Height: 5\' 11"  (180.3 cm) Weight: 195 lb 1.7 oz (88.5 kg) IBW/kg (Calculated) : 75.3  Vital Signs: Temp: 98.6 F (37 C) (08/07 0332) Temp Source: Oral (08/07 0332) BP: 140/72 mmHg (08/07 0939) Pulse Rate: 81 (08/07 0939)  Labs:  Recent Labs  12/27/14 1028 12/28/14 0016 12/28/14 0348 12/28/14 2138 12/29/14 0448  HGB 7.7*  --  6.7* 8.7* 8.5*  HCT 22.3*  --  19.5* 25.5* 24.6*  PLT 153  --  134*  --  108*  APTT 31  --  143*  --   --   LABPROT 16.0*  --  17.6*  --   --   INR 1.27  --  1.44  --   --   HEPARINUNFRC  --  0.55 0.53  --  0.51  CREATININE 1.93*  --  1.59*  --   --   CKTOTAL 43*  --   --   --   --     Estimated Creatinine Clearance: 48.7 mL/min (by C-G formula based on Cr of 1.59).  Assessment: 83 YOM s/p CABG 3 weeks ago, readmitted with new DVT, AKI, and worsening anemia, currently on IV heparin, heparin level therapeutic stable and therapeutic on 1450 units/hr. hgb 6.7 > 8.5 after 2 units PRBCs yesterday. Plt 108, trending down, checking FOB. No bleeding noted per chart. Might consider oral factor Xa inhibitor x 6 months for anticoagulation.    Goal of Therapy:  Heparin level 0.3-0.7 units/ml Monitor platelets by anticoagulation protocol: Yes   Plan:  - Continue heparin infusion 1450 units/hr  - Monitor CBC, s/sx of bleeding closely - f/u plans for oral anticoagulation   [name], PharmD, BCPS  Clinical Pharmacist  Pager: [phone]   12/29/2014,11:15 AM

## 2014-12-29 NOTE — Progress Notes (Deleted)
[facility] PROGRESS NOTE  [name] [id number] DOB: 1950-05-27 DOA: 12/27/2014 PCP: Donnie Coffin, MD  Assessment/Plan:  Acute DVT  -Thus far continued on IV heparin. Appreciate CT surg input. Recs for factor Xa inhibitor x 6 mos. Will discuss with SW regarding most affordable option for pt before choosing a regimen -Bedrest with bathroom privileges only initially -Utilize narcotic and nonnarcotic medications for pain  Active Problems:  Anemia -Worsened postoperatively -Checking stools for blood -Documented as acute blood loss anemia postoperative during last admission -Current MCV and normocytic range but previously in macrocytic range -Transfused 2 units with good response - Pt constipated. Will give cathartic   Acute renal failure/dehydration with hyponatremia -Seems related to recent addition of Lasix -Holding Lasix -cont IV fluids   Essential hypertension, benign -Current blood pressure controlled -ACE inhibitor on hold in setting of acute renal failure -Continue beta blocker   CAD/recent NSTEMI/S/P CABG x 5 (12/13/14) -Pt well known to Dr. Cyndia Bent. Appreciate input -Continue beta blocker, Plavix and baby aspirin for now-may need to stop baby aspirin depending on type of long-acting anticoagulation chosen   Hyperlipidemia  -Continue Crestor   PAD  -Stable and pulses at baseline according to family and patient   AAA  -Very complex and due to degree of calcification has been deemed not electively operable -Followed by Dr. Sammuel Hines at [facility] Status: Full Family Communication: Pt in room  Disposition Plan: Pending   Consultants:  CT Surgery  Procedures:    Antibiotics:    HPI/Subjective: Feels constipated. No other complaints  Objective: Filed Vitals:   12/28/14 2040 12/29/14 0332 12/29/14 0939 12/29/14 1342  BP: 159/71 138/70 140/72 143/63  Pulse: 81 82 81 82  Temp: 98.2 F (36.8 C) 98.6 F (37 C)  98.7 F (37.1  C)  TempSrc: Oral Oral  Oral  Resp: 18 20  18   Height:      Weight:  88.5 kg (195 lb 1.7 oz)    SpO2: 98% 95%  97%    Intake/Output Summary (Last 24 hours) at 12/29/14 1551 Last data filed at 12/29/14 1237  Gross per 24 hour  Intake   1708 ml  Output   1275 ml  Net    433 ml   Filed Weights   12/27/14 1621 12/28/14 0434 12/29/14 0332  Weight: 87.952 kg (193 lb 14.4 oz) 86.909 kg (191 lb 9.6 oz) 88.5 kg (195 lb 1.7 oz)    Exam:   General:  Awake, in nad  Cardiovascular: regular, s1, s2  Respiratory: normal resp effort, no wheezing  Abdomen: soft,nondistended  Musculoskeletal: perfused, no clubbing   Data Reviewed: Basic Metabolic Panel:  Recent Labs Lab 12/27/14 1028 12/28/14 0348  NA 130* 131*  K 3.9 4.3  CL 93* 96*  CO2 25 26  GLUCOSE 106* 98  BUN 24* 19  CREATININE 1.93* 1.59*  CALCIUM 8.7* 8.1*   Liver Function Tests:  Recent Labs Lab 12/27/14 1028  AST 24  ALT 17  ALKPHOS 68  BILITOT 1.4*  PROT 6.2*  ALBUMIN 2.8*   No results for input(s): LIPASE, AMYLASE in the last 168 hours. No results for input(s): AMMONIA in the last 168 hours. CBC:  Recent Labs Lab 12/27/14 1028 12/28/14 0348 12/28/14 2138 12/29/14 0448  WBC 7.4 5.8  --  6.9  HGB 7.7* 6.7* 8.7* 8.5*  HCT 22.3* 19.5* 25.5* 24.6*  MCV 97.0 99.0  --  92.8  PLT 153 134*  --  108*   Cardiac  Enzymes:  Recent Labs Lab 12/27/14 1028  CKTOTAL 43*   BNP (last 3 results)  Recent Labs  12/27/14 1028  BNP 568.1*    ProBNP (last 3 results)  Recent Labs  11/08/14 1544  PROBNP 179.0*    CBG: No results for input(s): GLUCAP in the last 168 hours.  No results found for this or any previous visit (from the past 240 hour(s)).   Studies: No results found.  Scheduled Meds: . aspirin EC  81 mg Oral QPM  . clopidogrel  75 mg Oral Daily  . docusate sodium  100 mg Oral BID  . famotidine  20 mg Oral BID  . metoprolol tartrate  37.5 mg Oral BID  . rosuvastatin  20 mg  Oral QHS  . sodium chloride  3 mL Intravenous Q12H  . zolpidem  5 mg Oral Once   Continuous Infusions: . sodium chloride 75 mL/hr (12/28/14 2103)  . heparin 1,450 Units/hr (12/28/14 2104)    Principal Problem:   Acute DVT (deep venous thrombosis) Active Problems:   Essential hypertension, benign   Hyperlipidemia with target LDL less than 70   PAD (peripheral artery disease)   AAA (abdominal aortic aneurysm)   CAD (coronary artery disease)   S/P CABG x 5 12/13/14   Anemia   Acute renal failure   AKI (acute kidney injury)   Absolute anemia    Maisen Schmit K  [facility] Pager [phone]. If 7PM-7AM, please contact night-coverage at [email or web address], password [facility] 12/29/2014, 3:51 PM  LOS: 2 days

## 2014-12-29 NOTE — Progress Notes (Signed)
[facility] PROGRESS NOTE  [name] [id number] DOB: November 13, 1950 DOA: 12/27/2014 PCP: Donnie Coffin, MD  Assessment/Plan:  Acute DVT  -Thus far continued on IV heparin. Appreciate CT surg input. Recs for factor Xa inhibitor x 6 mos. Will discuss with SW regarding most affordable option for pt before choosing a regimen -Bedrest with bathroom privileges only initially -Utilize narcotic and nonnarcotic medications for pain  Active Problems:  Anemia -Worsened postoperatively -Checking stools for blood -Documented as acute blood loss anemia postoperative during last admission -Current MCV and normocytic range but previously in macrocytic range -Transfused 2 units with good response - Pt constipated. Will give cathartic   Acute renal failure/dehydration with hyponatremia -Seems related to recent addition of Lasix -Holding Lasix -cont IV fluids   Essential hypertension, benign -Current blood pressure controlled -ACE inhibitor on hold in setting of acute renal failure -Continue beta blocker   CAD/recent NSTEMI/S/P CABG x 5 (12/13/14) -Pt well known to Dr. Cyndia Bent. Appreciate input -Continue beta blocker, Plavix and baby aspirin for now-may need to stop baby aspirin depending on type of long-acting anticoagulation chosen   Hyperlipidemia  -Continue Crestor   PAD  -Stable and pulses at baseline according to family and patient   AAA  -Very complex and due to degree of calcification has been deemed not electively operable -Followed by Dr. Sammuel Hines at [facility] Status: Full Family Communication: Pt in room  Disposition Plan: Pending   Consultants:  CT Surgery  Procedures:    Antibiotics:    HPI/Subjective: Feels constipated. No other complaints  Objective: Filed Vitals:   12/28/14 2040 12/29/14 0332 12/29/14 0939 12/29/14 1342  BP: 159/71 138/70 140/72 143/63  Pulse: 81 82 81 82  Temp: 98.2 F (36.8 C) 98.6 F (37 C)  98.7 F (37.1  C)  TempSrc: Oral Oral  Oral  Resp: 18 20  18   Height:      Weight:  88.5 kg (195 lb 1.7 oz)    SpO2: 98% 95%  97%    Intake/Output Summary (Last 24 hours) at 12/29/14 1554 Last data filed at 12/29/14 1237  Gross per 24 hour  Intake   1708 ml  Output   1275 ml  Net    433 ml   Filed Weights   12/27/14 1621 12/28/14 0434 12/29/14 0332  Weight: 87.952 kg (193 lb 14.4 oz) 86.909 kg (191 lb 9.6 oz) 88.5 kg (195 lb 1.7 oz)    Exam:   General:  Awake, in nad  Cardiovascular: regular, s1, s2  Respiratory: normal resp effort, no wheezing  Abdomen: soft,nondistended, pos BS  Musculoskeletal: perfused, no clubbing, no cyanosis  Data Reviewed: Basic Metabolic Panel:  Recent Labs Lab 12/27/14 1028 12/28/14 0348  NA 130* 131*  K 3.9 4.3  CL 93* 96*  CO2 25 26  GLUCOSE 106* 98  BUN 24* 19  CREATININE 1.93* 1.59*  CALCIUM 8.7* 8.1*   Liver Function Tests:  Recent Labs Lab 12/27/14 1028  AST 24  ALT 17  ALKPHOS 68  BILITOT 1.4*  PROT 6.2*  ALBUMIN 2.8*   No results for input(s): LIPASE, AMYLASE in the last 168 hours. No results for input(s): AMMONIA in the last 168 hours. CBC:  Recent Labs Lab 12/27/14 1028 12/28/14 0348 12/28/14 2138 12/29/14 0448  WBC 7.4 5.8  --  6.9  HGB 7.7* 6.7* 8.7* 8.5*  HCT 22.3* 19.5* 25.5* 24.6*  MCV 97.0 99.0  --  92.8  PLT 153 134*  --  108*  Cardiac Enzymes:  Recent Labs Lab 12/27/14 1028  CKTOTAL 43*   BNP (last 3 results)  Recent Labs  12/27/14 1028  BNP 568.1*    ProBNP (last 3 results)  Recent Labs  11/08/14 1544  PROBNP 179.0*    CBG: No results for input(s): GLUCAP in the last 168 hours.  No results found for this or any previous visit (from the past 240 hour(s)).   Studies: No results found.  Scheduled Meds: . aspirin EC  81 mg Oral QPM  . clopidogrel  75 mg Oral Daily  . docusate sodium  100 mg Oral BID  . famotidine  20 mg Oral BID  . metoprolol tartrate  37.5 mg Oral BID  .  rosuvastatin  20 mg Oral QHS  . sodium chloride  3 mL Intravenous Q12H  . zolpidem  5 mg Oral Once   Continuous Infusions: . sodium chloride 75 mL/hr (12/28/14 2103)  . heparin 1,450 Units/hr (12/28/14 2104)    Principal Problem:   Acute DVT (deep venous thrombosis) Active Problems:   Essential hypertension, benign   Hyperlipidemia with target LDL less than 70   PAD (peripheral artery disease)   AAA (abdominal aortic aneurysm)   CAD (coronary artery disease)   S/P CABG x 5 12/13/14   Anemia   Acute renal failure   AKI (acute kidney injury)   Absolute anemia    [name] K  [facility] Pager [phone]. If 7PM-7AM, please contact night-coverage at [email or web address], password [facility] 12/29/2014, 3:54 PM  LOS: 2 days

## 2014-12-30 DIAGNOSIS — I739 Peripheral vascular disease, unspecified: Secondary | ICD-10-CM

## 2014-12-30 DIAGNOSIS — Z951 Presence of aortocoronary bypass graft: Secondary | ICD-10-CM

## 2014-12-30 DIAGNOSIS — I82401 Acute embolism and thrombosis of unspecified deep veins of right lower extremity: Secondary | ICD-10-CM

## 2014-12-30 LAB — BASIC METABOLIC PANEL
ANION GAP: 12 (ref 5–15)
BUN: 9 mg/dL (ref 6–20)
CO2: 22 mmol/L (ref 22–32)
CREATININE: 1.15 mg/dL (ref 0.61–1.24)
Calcium: 8.2 mg/dL — ABNORMAL LOW (ref 8.9–10.3)
Chloride: 98 mmol/L — ABNORMAL LOW (ref 101–111)
GFR calc Af Amer: >60 mL/min (ref >60)
GLUCOSE: 99 mg/dL (ref 65–99)
Potassium: 3.3 mmol/L — ABNORMAL LOW (ref 3.5–5.1)
Sodium: 132 mmol/L — ABNORMAL LOW (ref 135–145)

## 2014-12-30 LAB — CBC
HCT: 28.4 % — ABNORMAL LOW (ref 39.0–52.0)
HEMOGLOBIN: 9.7 g/dL — AB (ref 13.0–17.0)
MCH: 31.7 pg (ref 26.0–34.0)
MCHC: 34.2 g/dL (ref 30.0–36.0)
MCV: 92.8 fL (ref 78.0–100.0)
Platelets: 84 10*3/uL — ABNORMAL LOW (ref 150–400)
RBC: 3.06 MIL/uL — ABNORMAL LOW (ref 4.22–5.81)
RDW: 16.1 % — AB (ref 11.5–15.5)
WBC: 6.5 10*3/uL (ref 4.0–10.5)

## 2014-12-30 LAB — OCCULT BLOOD X 1 CARD TO LAB, STOOL: Fecal Occult Bld: NEGATIVE

## 2014-12-30 LAB — HEPARIN LEVEL (UNFRACTIONATED): HEPARIN UNFRACTIONATED: 0.4 [IU]/mL (ref 0.30–0.70)

## 2014-12-30 MED ORDER — DOCUSATE SODIUM 100 MG PO CAPS: 100.0000 mg | ORAL_CAPSULE | Freq: Two times a day (BID) | ORAL | Status: DC

## 2014-12-30 MED ORDER — RIVAROXABAN (XARELTO) VTE STARTER PACK (15 & 20 MG): ORAL_TABLET | ORAL | Status: DC

## 2014-12-30 MED ORDER — POLYETHYLENE GLYCOL 3350 17 G PO PACK
17.0000 g | PACK | ORAL | Status: AC
  Administered 2014-12-30: 17 g via ORAL
  Filled 2014-12-30: qty 1

## 2014-12-30 MED ORDER — RIVAROXABAN 20 MG PO TABS: 20.0000 mg | ORAL_TABLET | Freq: Every day | ORAL | Status: DC

## 2014-12-30 MED ORDER — RIVAROXABAN 15 MG PO TABS
15.0000 mg | ORAL_TABLET | Freq: Two times a day (BID) | ORAL | Status: DC
  Administered 2014-12-30 – 2015-01-01 (×5): 15 mg via ORAL
  Filled 2014-12-30 (×7): qty 1

## 2014-12-30 NOTE — Progress Notes (Signed)
ANTICOAGULATION CONSULT NOTE - Follow Up Consult  Pharmacy Consult for heparin Indication: DVT  Allergies  Allergen Reactions  . Norvasc [Amlodipine Besylate] Swelling       . Pletal [Cilostazol] Diarrhea    Patient Measurements: Height: 5\' 11"  (180.3 cm) Weight: 192 lb 14.4 oz (87.5 kg) IBW/kg (Calculated) : 75.3  Vital Signs: Temp: 99 F (37.2 C) (08/08 0417) Temp Source: Oral (08/08 0417) BP: 156/68 mmHg (08/08 0417) Pulse Rate: 81 (08/08 0417)  Labs:  Recent Labs  12/27/14 1028  12/28/14 0348 12/28/14 2138 12/29/14 0448 12/30/14 0350  HGB 7.7*  --  6.7* 8.7* 8.5* 9.7*  HCT 22.3*  --  19.5* 25.5* 24.6* 28.4*  PLT 153  --  134*  --  108* 84*  APTT 31  --  143*  --   --   --   LABPROT 16.0*  --  17.6*  --   --   --   INR 1.27  --  1.44  --   --   --   HEPARINUNFRC  --   < > 0.53  --  0.51 0.40  CREATININE 1.93*  --  1.59*  --   --  1.15  CKTOTAL 43*  --   --   --   --   --   < > = values in this interval not displayed.  Estimated Creatinine Clearance: 67.3 mL/min (by C-G formula based on Cr of 1.15).  Assessment: 64 y/o M s/p CABG 3 wks ago presents s/p fall last PM and with B leg swelling/pain. Doppler + for R DVT.  Anticoagulation: new RLE DVT, on IV heparin till cleared by cards to start NOACs. Heparin level remains therapeutic on 1450 units/hr (0.40 this am, now therapeutic x 3)  Hematology / Oncology: H&H 9.7/28.4, Plt 84   Goal of Therapy:  Heparin level 0.3-0.7 units/ml Monitor platelets by anticoagulation protocol: Yes   Plan:  - Continue heparin at 1450 units/hr  - Daily heparin level and CBC  - Monitor CBC, s/sx of bleeding closely  - f/u plans for oral anticoagulation  Levester Fresh, PharmD, BCPS Clinical Pharmacist Pager [phone] 12/30/2014 8:30 AM

## 2014-12-30 NOTE — Progress Notes (Signed)
[facility] PROGRESS NOTE  [name] [id number] DOB: [birth year] DOA: 12/27/2014 PCP: Donnie Coffin, MD  Assessment/Plan:  Acute DVT  -Appreciate CT surg input. -Utilize narcotic and nonnarcotic medications for pain -Pt has been transitioned to Xarelto  Active Problems:  Anemia -Worsened postoperatively -Stools neg for blood -Documented as acute blood loss anemia postoperative during last admission -Current MCV and normocytic range but previously in macrocytic range -Transfused 2 units with good response, hgb up to 9.7   Acute renal failure/dehydration with hyponatremia -Seems related to recent addition of Lasix -Holding Lasix -cont IV fluids -improved   Essential hypertension, benign -Current blood pressure controlled -ACE inhibitor on hold in setting of acute renal failure -Continue beta blocker   CAD/recent NSTEMI/S/P CABG x 5 (12/13/14) -Pt well known to Dr. Cyndia Bent. Appreciate input -Continue beta blocker, Plavix and baby aspirin for now-may need to stop baby aspirin depending on type of long-acting anticoagulation chosen   Hyperlipidemia  -Continue Crestor   PAD  -Stable and pulses at baseline according to family and patient   AAA  -Very complex and due to degree of calcification has been deemed not electively operable -Followed by Dr. Sammuel Hines at [facility]  Constipation - Good improvement with cathartics  Code Status: Full Family Communication: Pt in room  Disposition Plan: Pending   Consultants:  CT Surgery  Procedures:    Antibiotics:    HPI/Subjective: Continues to feel constipated. No improvement from sorbitol given yesterday  Objective: Filed Vitals:   12/29/14 2100 12/30/14 0417 12/30/14 1013 12/30/14 1350  BP: 156/71 156/68 155/65 134/68  Pulse: 93 81 85 93  Temp: 99.9 F (37.7 C) 99 F (37.2 C)  98.3 F (36.8 C)  TempSrc: Oral Oral  Oral  Resp: 18 18  18   Height:      Weight:  87.5 kg (192 lb 14.4 oz)     SpO2: 94% 98%  98%    Intake/Output Summary (Last 24 hours) at 12/30/14 1826 Last data filed at 12/30/14 1230  Gross per 24 hour  Intake   2748 ml  Output   1702 ml  Net   1046 ml   Filed Weights   12/28/14 0434 12/29/14 0332 12/30/14 0417  Weight: 86.909 kg (191 lb 9.6 oz) 88.5 kg (195 lb 1.7 oz) 87.5 kg (192 lb 14.4 oz)    Exam:   General:  Awake, in nad  Cardiovascular: regular, s1, s2  Respiratory: normal resp effort, no wheezing  Abdomen: soft,nondistended, pos BS  Musculoskeletal: perfused, no clubbing, no cyanosis  Data Reviewed: Basic Metabolic Panel:  Recent Labs Lab 12/27/14 1028 12/28/14 0348 12/30/14 0350  NA 130* 131* 132*  K 3.9 4.3 3.3*  CL 93* 96* 98*  CO2 25 26 22   GLUCOSE 106* 98 99  BUN 24* 19 9  CREATININE 1.93* 1.59* 1.15  CALCIUM 8.7* 8.1* 8.2*   Liver Function Tests:  Recent Labs Lab 12/27/14 1028  AST 24  ALT 17  ALKPHOS 68  BILITOT 1.4*  PROT 6.2*  ALBUMIN 2.8*   No results for input(s): LIPASE, AMYLASE in the last 168 hours. No results for input(s): AMMONIA in the last 168 hours. CBC:  Recent Labs Lab 12/27/14 1028 12/28/14 0348 12/28/14 2138 12/29/14 0448 12/30/14 0350  WBC 7.4 5.8  --  6.9 6.5  HGB 7.7* 6.7* 8.7* 8.5* 9.7*  HCT 22.3* 19.5* 25.5* 24.6* 28.4*  MCV 97.0 99.0  --  92.8 92.8  PLT 153 134*  --  108* 84*  Cardiac Enzymes:  Recent Labs Lab 12/27/14 1028  CKTOTAL 43*   BNP (last 3 results)  Recent Labs  12/27/14 1028  BNP 568.1*    ProBNP (last 3 results)  Recent Labs  11/08/14 1544  PROBNP 179.0*    CBG: No results for input(s): GLUCAP in the last 168 hours.  No results found for this or any previous visit (from the past 240 hour(s)).   Studies: No results found.  Scheduled Meds: . aspirin EC  81 mg Oral QPM  . clopidogrel  75 mg Oral Daily  . docusate sodium  100 mg Oral BID  . famotidine  20 mg Oral BID  . metoprolol tartrate  37.5 mg Oral BID  . Rivaroxaban  15 mg  Oral BID WC  . [START ON 01/20/2015] rivaroxaban  20 mg Oral Q supper  . rosuvastatin  20 mg Oral QHS  . sodium chloride  3 mL Intravenous Q12H  . zolpidem  5 mg Oral Once   Continuous Infusions: . sodium chloride 75 mL/hr (12/28/14 2103)    Principal Problem:   Acute DVT (deep venous thrombosis) Active Problems:   Essential hypertension, benign   Hyperlipidemia with target LDL less than 70   PAD (peripheral artery disease)   AAA (abdominal aortic aneurysm)   CAD (coronary artery disease)   S/P CABG x 5 12/13/14   Anemia   Acute renal failure   AKI (acute kidney injury)   Absolute anemia    CHIU, STEPHEN K  [facility] Pager [phone]. If 7PM-7AM, please contact night-coverage at [email or web address], password [facility] 12/30/2014, 6:26 PM  LOS: 3 days

## 2014-12-30 NOTE — Care Management Note (Signed)
Case Management Note  Patient Details  Name: [name] MRN: [id number] Date of Birth: 1951/05/18  Subjective/Objective:    Pt admitted with Acute DVT                Action/Plan:  Pt is independent from home.  Pt will discharge on Xeralto, CM will assist with medication initiation post discharge.  CM will follow for disposition needs   Expected Discharge Date:                  Expected Discharge Plan:     In-House Referral:     Discharge planning Services  CM Consult, Medication Assistance  Post Acute Care Choice:    Choice offered to:     DME Arranged:    DME Agency:     HH Arranged:    HH Agency:     Status of Service:  In process, will continue to follow  Medicare Important Message Given:    Date Medicare IM Given:    Medicare IM give by:    Date Additional Medicare IM Given:    Additional Medicare Important Message give by:     If discussed at [place] of Stay Meetings, dates discussed:    Additional Comments: CM assessed pt; CM provided pt free 30 day Xeralto card and instructed pt to provide with non refillable prescription at pharmacy of choice [facility] on [address]).  CM contacted pharmacy and was informed that prescription is available for pick up.  CM submitted benefit check:   S/W MARGIE @ [employer] # [phone]   XARELTO 15 MG BID FOR 30 DAYS  COVER- YES  CO-PAY- $ 20.00 60 PILLS FOR 30 DAY SUPPLY  TIER- 3 DRUG  PRIOR APPROVAL - YES # [phone]   XARELTO 15 MG QD FOR 30 DAYS  COVER-YES  CO-PAY- $20.00 30 PILLS FOR 30 DAY SUPPLY  TIER-3 DRUG  PRIOR APPROVAL,- YES # [phone]  PHARMACY- [facility], [facility] AND    [facility] NOT IN NETWORK    XARELTO 20 MG BID FOR 30 DAYS  COVER- YES  CO-PAY- $20.00 60 PILLS FOR DAY SUPPLY  TIER- 3 DRUG  PRIOR APPROVAL - YES # [id number]   XARELTO 20 MG QD FOR 30 DAYS  COVER- YES  CO-PAY- $ 20.00 30 PILLS FOR 30 DAY SUPPLY  TIER- 3 DRUG  PRIOR  APPROVAL - YES # [phone]  PHARMACY : SAME AS ABOVE   CM provided copay information to pt, pt stated he could pay copay. Maryclare Labrador, RN 12/30/2014, 12:24 PM

## 2014-12-30 NOTE — Progress Notes (Signed)
  Subjective:  Constipated. Right leg feeling better. Says he has walked a little  Objective: Vital signs in last 24 hours: Temp:  [98.7 F (37.1 C)-100.7 F (38.2 C)] 99 F (37.2 C) (08/08 0417) Pulse Rate:  [81-95] 85 (08/08 1013) Cardiac Rhythm:  [-] Normal sinus rhythm (08/08 0742) Resp:  [18] 18 (08/08 0417) BP: (143-175)/(63-76) 155/65 mmHg (08/08 1013) SpO2:  [94 %-98 %] 98 % (08/08 0417) Weight:  [87.5 kg (192 lb 14.4 oz)] 87.5 kg (192 lb 14.4 oz) (08/08 0417)  Hemodynamic parameters for last 24 hours:    Intake/Output from previous day: 08/07 0701 - 08/08 0700 In: 2148 [I.V.:2148] Out: 1750 [Urine:1750] Intake/Output this shift: Total I/O In: 360 [P.O.:360] Out: 650 [Urine:650]  General appearance: alert and cooperative Heart: regular rate and rhythm, S1, S2 normal, no murmur, click, rub or gallop Lungs: clear to auscultation bilaterally Extremities: edema minimal Wound: incisions ok  Lab Results:  Recent Labs  12/29/14 0448 12/30/14 0350  WBC 6.9 6.5  HGB 8.5* 9.7*  HCT 24.6* 28.4*  PLT 108* 84*   BMET:  Recent Labs  12/28/14 0348 12/30/14 0350  NA 131* 132*  K 4.3 3.3*  CL 96* 98*  CO2 26 22  GLUCOSE 98 99  BUN 19 9  CREATININE 1.59* 1.15  CALCIUM 8.1* 8.2*    PT/INR:  Recent Labs  12/28/14 0348  LABPROT 17.6*  INR 1.44   ABG    Component Value Date/Time   PHART 7.410 12/13/2014 1816   HCO3 21.9 12/13/2014 1816   TCO2 23 12/14/2014 1638   ACIDBASEDEF 2.0 12/13/2014 1816   O2SAT 94.0 12/13/2014 1816   CBG (last 3)  No results for input(s): GLUCAP in the last 72 hours.  Assessment/Plan:  RLE DVT: I would start a Xa inhibitor now and get off heparin. His platelet count has dropped over the past two days from 134 to 84. He had recent heparin exposure for CABG surgery and therefore at increased risk for HIT. I think he should be ambulating as much as tolerated. There is no reason for bedrest with DVT after the patient is on  anticoagulation. They have better outcomes with early ambulation.  LOS: 3 days    Gaye Pollack 12/30/2014

## 2014-12-30 NOTE — Discharge Instructions (Addendum)

## 2014-12-30 NOTE — Care Management Important Message (Signed)
Important Message  Patient Details  Name: [name] MRN: [id number] Date of Birth: 1951-04-24   Medicare Important Message Given:  Yes-second notification given    Pricilla Handler 12/30/2014, 2:39 PM

## 2014-12-31 LAB — CBC
HEMATOCRIT: 29.9 % — AB (ref 39.0–52.0)
Hemoglobin: 10.2 g/dL — ABNORMAL LOW (ref 13.0–17.0)
MCH: 31.4 pg (ref 26.0–34.0)
MCHC: 34.1 g/dL (ref 30.0–36.0)
MCV: 92 fL (ref 78.0–100.0)
Platelets: 64 10*3/uL — ABNORMAL LOW (ref 150–400)
RBC: 3.25 MIL/uL — ABNORMAL LOW (ref 4.22–5.81)
RDW: 15.3 % (ref 11.5–15.5)
WBC: 6.6 10*3/uL (ref 4.0–10.5)

## 2014-12-31 LAB — BASIC METABOLIC PANEL
Anion gap: 8 (ref 5–15)
BUN: 8 mg/dL (ref 6–20)
CO2: 24 mmol/L (ref 22–32)
Calcium: 8.5 mg/dL — ABNORMAL LOW (ref 8.9–10.3)
Chloride: 99 mmol/L — ABNORMAL LOW (ref 101–111)
Creatinine, Ser: 1.21 mg/dL (ref 0.61–1.24)
GFR calc Af Amer: >60 mL/min (ref >60)
GFR calc non Af Amer: >60 mL/min (ref >60)
Glucose, Bld: 101 mg/dL — ABNORMAL HIGH (ref 65–99)
POTASSIUM: 3.3 mmol/L — AB (ref 3.5–5.1)
SODIUM: 131 mmol/L — AB (ref 135–145)

## 2014-12-31 LAB — MAGNESIUM: Magnesium: 1.1 mg/dL — ABNORMAL LOW (ref 1.7–2.4)

## 2014-12-31 MED ORDER — POTASSIUM CHLORIDE CRYS ER 20 MEQ PO TBCR
40.0000 meq | EXTENDED_RELEASE_TABLET | Freq: Once | ORAL | Status: AC
Start: 2014-12-31 — End: 2014-12-31
  Administered 2014-12-31: 40 meq via ORAL
  Filled 2014-12-31: qty 2

## 2014-12-31 MED ORDER — METOPROLOL TARTRATE 50 MG PO TABS
50.0000 mg | ORAL_TABLET | Freq: Two times a day (BID) | ORAL | Status: DC
  Administered 2014-12-31 – 2015-01-05 (×11): 50 mg via ORAL
  Filled 2014-12-31 (×12): qty 1

## 2014-12-31 MED ORDER — POTASSIUM CHLORIDE CRYS ER 20 MEQ PO TBCR
30.0000 meq | EXTENDED_RELEASE_TABLET | Freq: Two times a day (BID) | ORAL | Status: DC
  Administered 2014-12-31 – 2015-01-05 (×11): 30 meq via ORAL
  Filled 2014-12-31 (×12): qty 1

## 2014-12-31 NOTE — Progress Notes (Addendum)
      [address]       [place] [address]             [phone]             Subjective: Patient has complaints of right calf pain  Objective: Vital signs in last 24 hours: Temp:  [98.3 F (36.8 C)-98.6 F (37 C)] 98.6 F (37 C) (08/08 2037) Pulse Rate:  [85-99] 99 (08/09 0538) Cardiac Rhythm:  [-] Normal sinus rhythm (08/09 0751) Resp:  [18] 18 (08/09 0538) BP: (134-175)/(65-85) 151/81 mmHg (08/09 0538) SpO2:  [96 %-98 %] 96 % (08/09 0538) Weight:  [190 lb 7.6 oz (86.4 kg)] 190 lb 7.6 oz (86.4 kg) (08/09 0538)   Current Weight  12/31/14 190 lb 7.6 oz (86.4 kg)      Intake/Output from previous day: 08/08 0701 - 08/09 0700 In: 600 [P.O.:600] Out: 1902 [Urine:1900; Stool:2]   Physical Exam:  Cardiovascular: Tachycardic Pulmonary: Clear to auscultation bilaterally; no rales, wheezes, or rhonchi. Abdomen: Soft, non tender, bowel sounds present. Extremities: Right lower extremity edema Wounds: Clean and dry.  No erythema or signs of infection.  Lab Results: CBC: Recent Labs  12/30/14 0350 12/31/14 0337  WBC 6.5 6.6  HGB 9.7* 10.2*  HCT 28.4* 29.9*  PLT 84* 64*   BMET:  Recent Labs  12/30/14 0350 12/31/14 0337  NA 132* 131*  K 3.3* 3.3*  CL 98* 99*  CO2 22 24  GLUCOSE 99 101*  BUN 9 8  CREATININE 1.15 1.21  CALCIUM 8.2* 8.5*    PT/INR:  Lab Results  Component Value Date   INR 1.44 12/28/2014   INR 1.27 12/27/2014   INR 1.46 12/13/2014   ABG:  INR: Will add last result for INR, ABG once components are confirmed Will add last 4 CBG results once components are confirmed  Assessment/Plan:  1. CV - ST in the 110's. On Lopressor 37.5 mg bid and Plavix 75 mg daily. Will increase Lopressor to 50 mg bid for better HR control. 2.  DVT-on Xarelto 3. Anemia-Had previous transfusion and H and H up to 10.2 and 29.9 4. Thrombocytopenia-platelets decreased to 64,000. On baby aspirin and Plavix. Recent heparin exposure with heart  surgery. Will discuss with Dr. Cyndia Bent if should check HIT and stop aspirin. 5. Supplement potassium 6. Will stop IVF as tolerating diet and has had recent heart surgery  ZIMMERMAN,DONIELLE MPA-C 12/31/2014,7:54 AM   Chart reviewed, patient examined, agree with above. I have stopped aspirin and Plavix while on Xarelto. His platelet count has dropped significantly on heparin. I would send HIT panel. I ordered it. If his platelets start to rise over the next few days off heparin then HIT unlikely. I would not send him home until his platelets start to rise. He is walking a little but not very far due to leg pain. He should be taking at least three walks per day even if short.

## 2014-12-31 NOTE — Progress Notes (Addendum)
[facility] PROGRESS NOTE  [name] [id number] DOB: Nov 25, 1950 DOA: 12/27/2014 PCP: Donnie Coffin, MD   Off Service Summary: [phone] s/p recent CABG presents with new LE DVT. Initially  On heparin gtt transitioned to xarelto. Pt developed thrombocytopenia, likely HIT. Cont to monitor plts.  Assessment/Plan:  Acute DVT  -Appreciate CT surg input. -Utilize narcotic and nonnarcotic medications for pain -Pt has been transitioned to Xarelto. Tolerating thus far  Active Problems:  Anemia -Worsened postoperatively -Stools are neg for blood -Documented as acute blood loss anemia postoperative during last admission -Current MCV and normocytic range but previously in macrocytic range -Transfused 2 units with good response, hgb up to 10.2  Thrombocytopenia - Suspect related to recent heparin gtt - Heparin now on hold - Cont monitor plt count   Acute renal failure/dehydration with hyponatremia -Seems related to recent addition of Lasix -Holding Lasix -cont IV fluids -improved   Essential hypertension, benign -Current blood pressure controlled -ACE inhibitor on hold in setting of acute renal failure -Continue beta blocker   CAD/recent NSTEMI/S/P CABG x 5 (12/13/14) -Pt well known to Dr. Cyndia Bent. Appreciate input -Continue beta blocker -Stable   Hyperlipidemia  -Continue Crestor   PAD  -Stable and pulses at baseline according to family and patient   AAA  -Very complex and due to degree of calcification has been deemed not electively operable -Followed by Dr. Sammuel Hines at [facility]  Constipation - Good improvement with cathartics  Code Status: Full Family Communication: Pt in room  Disposition Plan: Pending   Consultants:  CT Surgery  Procedures:    Antibiotics:    HPI/Subjective: Feels well today. No complaints  Objective: Filed Vitals:   12/30/14 2037 12/31/14 0538 12/31/14 0941 12/31/14 1500  BP: 175/85 151/81 155/82 116/70  Pulse:  92 99 98 100  Temp: 98.6 F (37 C)   99.9 F (37.7 C)  TempSrc: Oral   Oral  Resp: 18 18  20   Height:      Weight:  86.4 kg (190 lb 7.6 oz)    SpO2: 96% 96%  98%    Intake/Output Summary (Last 24 hours) at 12/31/14 1803 Last data filed at 12/31/14 1145  Gross per 24 hour  Intake    240 ml  Output    700 ml  Net   -460 ml   Filed Weights   12/29/14 0332 12/30/14 0417 12/31/14 0538  Weight: 88.5 kg (195 lb 1.7 oz) 87.5 kg (192 lb 14.4 oz) 86.4 kg (190 lb 7.6 oz)    Exam:   General:  Awake, in nad  Cardiovascular: regular, s1, s2  Respiratory: normal resp effort, no wheezing  Abdomen: soft,nondistended, pos BS  Musculoskeletal: perfused, no clubbing, no cyanosis  Data Reviewed: Basic Metabolic Panel:  Recent Labs Lab 12/27/14 1028 12/28/14 0348 12/30/14 0350 12/31/14 0337  NA 130* 131* 132* 131*  K 3.9 4.3 3.3* 3.3*  CL 93* 96* 98* 99*  CO2 25 26 22 24   GLUCOSE 106* 98 99 101*  BUN 24* 19 9 8   CREATININE 1.93* 1.59* 1.15 1.21  CALCIUM 8.7* 8.1* 8.2* 8.5*  MG  --   --   --  1.1*   Liver Function Tests:  Recent Labs Lab 12/27/14 1028  AST 24  ALT 17  ALKPHOS 68  BILITOT 1.4*  PROT 6.2*  ALBUMIN 2.8*   No results for input(s): LIPASE, AMYLASE in the last 168 hours. No results for input(s): AMMONIA in the last 168 hours. CBC:  Recent Labs  Lab 12/27/14 1028 12/28/14 0348 12/28/14 2138 12/29/14 0448 12/30/14 0350 12/31/14 0337  WBC 7.4 5.8  --  6.9 6.5 6.6  HGB 7.7* 6.7* 8.7* 8.5* 9.7* 10.2*  HCT 22.3* 19.5* 25.5* 24.6* 28.4* 29.9*  MCV 97.0 99.0  --  92.8 92.8 92.0  PLT 153 134*  --  108* 84* 64*   Cardiac Enzymes:  Recent Labs Lab 12/27/14 1028  CKTOTAL 43*   BNP (last 3 results)  Recent Labs  12/27/14 1028  BNP 568.1*    ProBNP (last 3 results)  Recent Labs  11/08/14 1544  PROBNP 179.0*    CBG: No results for input(s): GLUCAP in the last 168 hours.  No results found for this or any previous visit (from the past  240 hour(s)).   Studies: No results found.  Scheduled Meds: . docusate sodium  100 mg Oral BID  . famotidine  20 mg Oral BID  . metoprolol tartrate  50 mg Oral BID  . potassium chloride  30 mEq Oral BID  . Rivaroxaban  15 mg Oral BID WC  . [START ON 01/20/2015] rivaroxaban  20 mg Oral Q supper  . rosuvastatin  20 mg Oral QHS  . sodium chloride  3 mL Intravenous Q12H  . zolpidem  5 mg Oral Once   Continuous Infusions:    Principal Problem:   Acute DVT (deep venous thrombosis) Active Problems:   Essential hypertension, benign   Hyperlipidemia with target LDL less than 70   PAD (peripheral artery disease)   AAA (abdominal aortic aneurysm)   CAD (coronary artery disease)   S/P CABG x 5 12/13/14   Anemia   Acute renal failure   AKI (acute kidney injury)   Absolute anemia    CHIU, STEPHEN K  [facility] Pager [phone]. If 7PM-7AM, please contact night-coverage at [email or web address], password [facility] 12/31/2014, 6:03 PM  LOS: 4 days

## 2014-12-31 NOTE — Progress Notes (Signed)
UR Completed. Leira Regino, RN, BSN.  [phone] 

## 2014-12-31 NOTE — Progress Notes (Signed)
CARDIAC REHAB PHASE I   PRE:  Rate/Rhythm: 93 sR  BP:  Sitting: 138/71        SaO2: 97 RA  MODE:  Ambulation: 36 ft   POST:  Rate/Rhythm: 113 ST  BP:  Sitting: 150/73         SaO2: 99 RA  Pt up in chair, reluctant to ambulate, ultimately agreeable with encouragement. Pt needed to be reminded of sternal precautions, stood with minimal assistance. Pt ambulated 36 ft on RA, rolling walker, gait belt, assist x 2 (followed with wheelchair), mildly unsteady gait, tolerated fair, standing rest x2, seated rest x1 (pt had to sit to rest in chair upon entering room after walk then ambulated to recliner). Pt c/o burning in the R leg with ambulation, using his arms and rolling walker as a crutch in order to avoid putting weight on his R leg.  Pt did c/o of chest pain after ambulation and after putting pressure on arms/sternum.  Pt needs frequent reminders of sternal precautions during ambulation. Pt to recliner after walk, feet elevated, call bell within reach. Will continue to follow.  [id number]  [name], RN, BSN 12/31/2014 1:43 PM

## 2014-12-31 NOTE — Evaluation (Signed)
Physical Therapy Evaluation Patient Details Name: [name] MRN: [id number] DOB: Sep 26, 1950 Today's Date: 12/31/2014   History of Present Illness  Pt is a 64 y/o male with a recent NSTEMI and subsequent CABG x5 with vein harvesting from the RLE on 12/13/14. He presents with progressive RLE swelling and discomfort, and a fall in the early morning of admission. Pt was found to have extensive acute DVT in the RLE involving R distal femoral vein, popliteal vein, posterior tibial veins and gastrocnemius veins.  Clinical Impression  Pt admitted with above diagnosis. Pt currently with functional limitations due to the deficits listed below (see PT Problem List). At the time of PT eval pt demonstrated a decline in function since d/c at the end of July. Pt requires mod assist to achieve full standing from a low seated surface, and even from a boosted recliner chair. Wife reports she cannot physically care for pt at home if he cannot transfer himself at least to standing. Pt is adamant about returning home at d/c, which is the plan as of now. Will continue to assess for needs and appropriate d/c disposition. Pt will benefit from skilled PT to increase their independence and safety with mobility to allow discharge to the venue listed below.       Follow Up Recommendations Home health PT;Supervision/Assistance - 24 hour    Equipment Recommendations  None recommended by PT    Recommendations for Other Services Other (comment) (Cardiac rehab)     Precautions / Restrictions Precautions Precautions: Fall;Sternal Precaution Comments: Pt requires total cues to recall precautions Restrictions Weight Bearing Restrictions: Yes (Sternal)      Mobility  Bed Mobility Overal bed mobility: Needs Assistance Bed Mobility: Supine to Sit     Supine to sit: Min assist     General bed mobility comments: Assist for trunk elevation as pt transitioned to full sitting position.   Transfers Overall transfer  level: Needs assistance Equipment used: Rolling walker (2 wheeled) Transfers: Sit to/from Stand Sit to Stand: Mod assist         General transfer comment: Assist required to power-up to full standing position. Pt pushed from knees as he was unable to rise without UE support. VC's for sternal precautions.   Ambulation/Gait Ambulation/Gait assistance: Min guard Ambulation Distance (Feet): 24 Feet (12' x2) Assistive device: Rolling walker (2 wheeled) Gait Pattern/deviations: Step-to pattern;Decreased stride length;Trunk flexed Gait velocity: Decreased Gait velocity interpretation: Below normal speed for age/gender General Gait Details: VC's for sequencing with the RW for energy conservation and pain control with RLE. Pt moving very slowly and required seated rest break after 12'. When pt sat he was SOB and using accessory muscles to breathe. O2 sats at 96% on RA during this time, HR at 112bpm.  Stairs            Wheelchair Mobility    Modified Rankin (Stroke Patients Only)       Balance Overall balance assessment: Needs assistance Sitting-balance support: Feet supported;No upper extremity supported Sitting balance-Leahy Scale: Fair     Standing balance support: Bilateral upper extremity supported;During functional activity Standing balance-Leahy Scale: Poor                               Pertinent Vitals/Pain Pain Assessment: Faces Faces Pain Scale: Hurts even more Pain Location: RLE Pain Descriptors / Indicators: Aching;Burning Pain Intervention(s): Limited activity within patient's tolerance;Monitored during session;Repositioned    Home Living Family/patient  expects to be discharged to:: Private residence Living Arrangements: Spouse/significant other Available Help at Discharge: Available 24 hours/day;Family Type of Home: House Home Access: Stairs to enter Entrance Stairs-Rails: Psychiatric nurse of Steps: 3 Home Layout:  Multi-level Home Equipment: Grab bars - tub/shower;Walker - 2 wheels;Shower seat - built in      Prior Function Level of Independence: Independent with assistive device(s)         Comments: Wife states that pt was doing very well with RW, sometimes not using it in the house when going a short distance. Was not having any difficulty with the stairs. When LE's began swelling that is when mobility started to decline.      Hand Dominance        Extremity/Trunk Assessment   Upper Extremity Assessment: Defer to OT evaluation           Lower Extremity Assessment: Generalized weakness;RLE deficits/detail RLE Deficits / Details: Acute pain and decreased strength consistent with above diagnosis.     Cervical / Trunk Assessment: Normal  Communication   Communication: No difficulties  Cognition Arousal/Alertness: Awake/alert Behavior During Therapy: WFL for tasks assessed/performed Overall Cognitive Status: Impaired/Different from baseline Area of Impairment: Memory;Safety/judgement;Awareness;Problem solving     Memory: Decreased recall of precautions   Safety/Judgement: Decreased awareness of safety;Decreased awareness of deficits Awareness: Emergent Problem Solving: Slow processing;Decreased initiation;Difficulty sequencing;Requires verbal cues General Comments: Wife reports that pt has intermittent confusion and she notes it more after he has had some of his medications.     General Comments      Exercises        Assessment/Plan    PT Assessment Patient needs continued PT services  PT Diagnosis Difficulty walking;Generalized weakness;Acute pain   PT Problem List Decreased strength;Decreased range of motion;Decreased activity tolerance;Decreased balance;Decreased mobility;Decreased knowledge of use of DME;Decreased safety awareness;Decreased knowledge of precautions;Pain  PT Treatment Interventions DME instruction;Gait training;Stair training;Functional mobility  training;Therapeutic activities;Therapeutic exercise;Neuromuscular re-education;Patient/family education   PT Goals (Current goals can be found in the Care Plan section) Acute Rehab PT Goals Patient Stated Goal: to return home PT Goal Formulation: With patient/family Time For Goal Achievement: 01/07/15 Potential to Achieve Goals: Good    Frequency Min 3X/week   Barriers to discharge        Co-evaluation               End of Session Equipment Utilized During Treatment: Gait belt Activity Tolerance: Patient limited by fatigue;No increased pain Patient left: in chair;with call bell/phone within reach;with family/visitor present Nurse Communication: Mobility status         Time: [id number] PT Time Calculation (min) (ACUTE ONLY): 38 min   Charges:   PT Evaluation $Initial PT Evaluation Tier I: 1 Procedure PT Treatments $Gait Training: 8-22 mins $Therapeutic Activity: 8-22 mins   PT G Codes:        Rolinda Roan 01/20/2015, 2:44 PM   Rolinda Roan, PT, DPT Acute Rehabilitation Services Pager: [phone]

## 2015-01-01 ENCOUNTER — Encounter: Payer: Medicare Other | Admitting: Nurse Practitioner

## 2015-01-01 ENCOUNTER — Inpatient Hospital Stay (HOSPITAL_COMMUNITY): Payer: Medicare Other

## 2015-01-01 ENCOUNTER — Encounter (HOSPITAL_COMMUNITY): Payer: Self-pay | Admitting: Radiology

## 2015-01-01 DIAGNOSIS — I82441 Acute embolism and thrombosis of right tibial vein: Principal | ICD-10-CM

## 2015-01-01 DIAGNOSIS — I82411 Acute embolism and thrombosis of right femoral vein: Secondary | ICD-10-CM

## 2015-01-01 DIAGNOSIS — D649 Anemia, unspecified: Secondary | ICD-10-CM

## 2015-01-01 DIAGNOSIS — I82431 Acute embolism and thrombosis of right popliteal vein: Secondary | ICD-10-CM

## 2015-01-01 DIAGNOSIS — I2699 Other pulmonary embolism without acute cor pulmonale: Secondary | ICD-10-CM

## 2015-01-01 DIAGNOSIS — D696 Thrombocytopenia, unspecified: Secondary | ICD-10-CM

## 2015-01-01 LAB — CBC
HCT: 28 % — ABNORMAL LOW (ref 39.0–52.0)
HEMOGLOBIN: 9.7 g/dL — AB (ref 13.0–17.0)
MCH: 32.3 pg (ref 26.0–34.0)
MCHC: 34.6 g/dL (ref 30.0–36.0)
MCV: 93.3 fL (ref 78.0–100.0)
PLATELETS: 73 10*3/uL — AB (ref 150–400)
RBC: 3 MIL/uL — AB (ref 4.22–5.81)
RDW: 15 % (ref 11.5–15.5)
WBC: 12.5 10*3/uL — AB (ref 4.0–10.5)

## 2015-01-01 LAB — HEPARIN INDUCED PLATELET AB (HIT ANTIBODY): HEPARIN INDUCED PLT AB: 2.744 {OD_unit} — AB (ref 0.000–0.400)

## 2015-01-01 MED ORDER — IOHEXOL 350 MG/ML SOLN
80.0000 mL | Freq: Once | INTRAVENOUS | Status: AC | PRN
  Administered 2015-01-01: 80 mL via INTRAVENOUS

## 2015-01-01 MED ORDER — FONDAPARINUX SODIUM 7.5 MG/0.6ML ~~LOC~~ SOLN
7.5000 mg | Freq: Every day | SUBCUTANEOUS | Status: DC
  Administered 2015-01-02 – 2015-01-05 (×4): 7.5 mg via SUBCUTANEOUS
  Filled 2015-01-01 (×5): qty 0.6

## 2015-01-01 NOTE — Progress Notes (Signed)
[facility] PROGRESS NOTE  [name] [id number] DOB: 02-Nov-1950 DOA: 12/27/2014 PCP: Donnie Coffin, MD   Off Service Summary: [phone] s/p recent CABG presents with new LE DVT. Initially  On heparin gtt transitioned to xarelto. Pt developed thrombocytopenia, likely HIT. Cont to monitor plts.  Assessment/Plan:  Acute RLE DVT  -Appreciate CVTS input. -was on IV Heparin, this was transitioned to Xarelto on 8/8, due to drop in platelets -HIT panel positive, will change to Arixtra, ask Pharmacy to dsoe -also check CTA chest due to dyspnea -will ask Heme to evaluate   Anemia-post op blood loss anemia -Worsened postoperatively -hemoccult negative -Transfused 2 units PRBC with good response, stable  Thrombocytopenia - Heparin induced clinically and HIt Ab panel positive - stop xarelto, start Arixtra, will ask pharmacy to dose - check Serotonin release assay  - requested Heme eval, d/w Dr.Shadad - FU CBC   Acute renal failure/dehydration with hyponatremia -Seems related to recent addition of Lasix -Holding Lasix, improved with hydration   Essential hypertension, benign -Current blood pressure controlled -ACE inhibitor held in setting of acute renal failure -Continue beta blocker   CAD/recent NSTEMI/S/P CABG x 5 (12/13/14) - Dr. Cyndia Bent following - Continue beta blocker, crestor   PAD  -Stable    AAA  -Very complex and due to degree of calcification has been deemed not electively operable -Followed by Dr[facility] Sammuel H[facility] at [facility]  Constipation - Good improvement with laxatives  Code Status: Full Family Communication: Pt in room  Disposition Plan: Pending   Consultants:  CT Surgery  Procedures:    Antibiotics:    HPI/Subjective: Feels well today. Mild dyspnea  Objective: Filed Vitals:   01/01/15 0649 01/01/15 0943 01/01/15 1136 01/01/15 1335  BP: 179/89 145/75  103/65  Pulse: 110 110  86  Temp: 99.9 F (37.7 C) 101 F (38.3 C) 98.9 F  (37.2 C) 98.5 F (36.9 C)  TempSrc: Oral Oral Oral Oral  Resp: 18 18  18   Height:      Weight:      SpO2: 99% 100%  100%    Intake/Output Summary (Last 24 hours) at 01/01/15 1354 Last data filed at 01/01/15 1230  Gross per 24 hour  Intake    720 ml  Output    300 ml  Net    420 ml   Filed Weights   12/30/14 0417 12/31/14 0538 01/01/15 0643  Weight: 87.5 kg (192 lb 14.4 oz) 86.4 kg (190 lb 7.6 oz) 87.1 kg (192 lb 0.3 oz)    Exam:   General:  Awake, in nad  Cardiovascular: regular, s1, s2  Respiratory: normal resp effort, no wheezing  Abdomen: soft,nondistended, pos BS Musculoskeletal: perfused, RLE 1-2plus edema  Data Reviewed: Basic Metabolic Panel:  Recent Labs Lab 12/27/14 1028 12/28/14 0348 12/30/14 0350 12/31/14 0337  NA 130* 131* 132* 131*  K 3.9 4.3 3.3* 3.3*  CL 93* 96* 98* 99*  CO2 25 26 22 24   GLUCOSE 106* 98 99 101*  BUN 24* 19 9 8   CREATININE 1.93* 1.59* 1.15 1.21  CALCIUM 8.7* 8.1* 8.2* 8.5*  MG  --   --   --  1.1*   Liver Function Tests:  Recent Labs Lab 12/27/14 1028  AST 24  ALT 17  ALKPHOS 68  BILITOT 1.4*  PROT 6.2*  ALBUMIN 2.8*   No results for input(s): LIPASE, AMYLASE in the last 168 hours. No results for input(s): AMMONIA in the last 168 hours. CBC:  Recent Labs Lab 12/28/14  [id number] 12/28/14 2138 12/29/14 0448 12/30/14 0350 12/31/14 0337 01/01/15 0610  WBC 5.8  --  6.9 6.5 6.6 12.5*  HGB 6.7* 8.7* 8.5* 9.7* 10.2* 9.7*  HCT 19.5* 25.5* 24.6* 28.4* 29.9* 28.0*  MCV 99.0  --  92.8 92.8 92.0 93.3  PLT 134*  --  108* 84* 64* 73*   Cardiac Enzymes:  Recent Labs Lab 12/27/14 1028  CKTOTAL 43*   BNP (last 3 results)  Recent Labs  12/27/14 1028  BNP 568.1*    ProBNP (last 3 results)  Recent Labs  11/08/14 1544  PROBNP 179.0*    CBG: No results for input(s): GLUCAP in the last 168 hours.  No results found for this or any previous visit (from the past 240 hour(s)).   Studies: No results  found.  Scheduled Meds: . docusate sodium  100 mg Oral BID  . famotidine  20 mg Oral BID  . metoprolol tartrate  50 mg Oral BID  . potassium chloride  30 mEq Oral BID  . Rivaroxaban  15 mg Oral BID WC  . [START ON 01/20/2015] rivaroxaban  20 mg Oral Q supper  . rosuvastatin  20 mg Oral QHS  . sodium chloride  3 mL Intravenous Q12H  . zolpidem  5 mg Oral Once   Continuous Infusions:    Principal Problem:   Acute DVT (deep venous thrombosis) Active Problems:   Essential hypertension, benign   Hyperlipidemia with target LDL less than 70   PAD (peripheral artery disease)   AAA (abdominal aortic aneurysm)   CAD (coronary artery disease)   S/P CABG x 5 12/13/14   Anemia   Acute renal failure   AKI (acute kidney injury)   Absolute anemia    [facility]  [facility] Pager [phone]. If 7PM-7AM, please contact night-coverage at [email or web address], password [facility] 01/01/2015, 1:54 PM  LOS: 5 days

## 2015-01-01 NOTE — Progress Notes (Signed)
ANTICOAGULATION CONSULT NOTE - Follow Up Consult  Pharmacy Consult for Arixtra Indication: DVT (HIT panel positive)  Allergies  Allergen Reactions  . Norvasc [Amlodipine Besylate] Swelling       . Pletal [Cilostazol] Diarrhea    Patient Measurements: Height: 5\' 11"  (180.3 cm) Weight: 192 lb 0.3 oz (87.1 kg) IBW/kg (Calculated) : 75.3   Vital Signs: Temp: 98.5 F (36.9 C) (08/10 1335) Temp Source: Oral (08/10 1335) BP: 103/65 mmHg (08/10 1335) Pulse Rate: 86 (08/10 1335)  Labs:  Recent Labs  12/30/14 0350 12/31/14 0337 01/01/15 0610  HGB 9.7* 10.2* 9.7*  HCT 28.4* 29.9* 28.0*  PLT 84* 64* 73*  HEPARINUNFRC 0.40  --   --   CREATININE 1.15 1.21  --     Estimated Creatinine Clearance: 64 mL/min (by C-G formula based on Cr of 1.21).   Assessment: 64 y/o M s/p CABG 3 wks ago, readmitted with new DVT, initially placed on IV heparin, then transitioned to xarelto. Noted pltc dropping and HIT antibody is back positive, SRA pending. Pharmacy is consulted to change xarelto to Arixtra. Hgb 9.7, plt 73K, sl up today, est. crcl ~ 60-65 ml/min. Last xarelto dose this morning at 0447  Goal of Therapy:   Monitor platelets by anticoagulation protocol: Yes   Plan:  - d/c xarelto - Arixtra 7.5mg  daily first dose tomorrow at 0600 - f/u SRA (need to add heparin allergy if positive)  [name], PharmD, BCPS  Clinical Pharmacist  Pager: [phone]   01/01/2015,2:44 PM

## 2015-01-01 NOTE — Progress Notes (Signed)
      [address]       [place] [address]             [phone]          Subjective: Feels ok, some DOE but stable , some RLE pain- stable, edema is improving  Objective: Vital signs in last 24 hours: Temp:  [99 F (37.2 C)-99.9 F (37.7 C)] 99.9 F (37.7 C) (08/10 0649) Pulse Rate:  [98-110] 110 (08/10 0649) Cardiac Rhythm:  [-] Sinus tachycardia (08/09 1934) Resp:  [18-20] 18 (08/10 0649) BP: (116-179)/(70-89) 179/89 mmHg (08/10 0649) SpO2:  [97 %-99 %] 99 % (08/10 0649) Weight:  [192 lb 0.3 oz (87.1 kg)] 192 lb 0.3 oz (87.1 kg) (08/10 0643)  Hemodynamic parameters for last 24 hours:    Intake/Output from previous day: 08/09 0701 - 08/10 0700 In: 360 [P.O.:360] Out: 475 [Urine:475] Intake/Output this shift:    General appearance: alert, cooperative and no distress Heart: regular rate and rhythm Lungs: clear to auscultation bilaterally Abdomen: benign Extremities: + RLE edema Wound: incis healing well  Lab Results:  Recent Labs  12/31/14 0337 01/01/15 0610  WBC 6.6 12.5*  HGB 10.2* 9.7*  HCT 29.9* 28.0*  PLT 64* 73*   BMET:  Recent Labs  12/30/14 0350 12/31/14 0337  NA 132* 131*  K 3.3* 3.3*  CL 98* 99*  CO2 22 24  GLUCOSE 99 101*  BUN 9 8  CREATININE 1.15 1.21  CALCIUM 8.2* 8.5*    PT/INR: No results for input(s): LABPROT, INR in the last 72 hours. ABG    Component Value Date/Time   PHART 7.410 12/13/2014 1816   HCO3 21.9 12/13/2014 1816   TCO2 23 12/14/2014 1638   ACIDBASEDEF 2.0 12/13/2014 1816   O2SAT 94.0 12/13/2014 1816   CBG (last 3)  No results for input(s): GLUCAP in the last 72 hours.  Meds Scheduled Meds: . docusate sodium  100 mg Oral BID  . famotidine  20 mg Oral BID  . metoprolol tartrate  50 mg Oral BID  . potassium chloride  30 mEq Oral BID  . Rivaroxaban  15 mg Oral BID WC  . [START ON 01/20/2015] rivaroxaban  20 mg Oral Q supper  . rosuvastatin  20 mg Oral QHS  . sodium chloride  3 mL  Intravenous Q12H  . zolpidem  5 mg Oral Once   Continuous Infusions:  PRN Meds:.acetaminophen **OR** acetaminophen, alum & mag hydroxide-simeth, morphine injection, ondansetron **OR** ondansetron (ZOFRAN) IV, oxyCODONE  Xrays No results found.  Assessment/Plan:  RLE DVT- stable on Rivaroxaban, Platelets slightly improved, HIT pending Remains tachy, SOB Borderline [id number] EKG pattern, could consider CT to eval for PE Cont medical management per primary   LOS: 5 days    Larita Deremer E 01/01/2015

## 2015-01-01 NOTE — Consult Note (Signed)
[facility] CONSULT[facility]  Patient Care Team: L.Donnie Coffin, MD as PCP - General (Family Medicine) [name], MD (Cardiology) Gaye Pollack, MD as Consulting Physician (Cardiothoracic Surgery)  REASON FOR CONSULT: Deep Venous Thrombosis (DVT)  Consulting Physician: Zola Button, MD  HISTORY OF PRESENTING ILLNESS:  [name] 64 y.o. male with multiple cardiac issues, including a recent hospitalization with NSTEMI, requiring CABG on 7/22 with discharge on 7/27  Admitted on 8/5 after sustaining a mechanical fall due limited mobility related to lower extremity swelling and right leg pain. He denied erythema. At the ED, chest x ray was unremarkable, but lower extremity ultrasound was positive for extensive acute DVT involving the right distal femoral vein, popliteal vein, posterior tibial veins and gastrocnemius veins. No DVT in the left lower extremity.CT angio was positive for bilateral PE, right greater than left.   Denies pre-syncopal episodes, palpitations or hemoptysis. He denies any shortness of breath. Denies any bleeding issues such as epistaxis, hematemesis, hematuria or hematochezia. He is indeed at risk of bleeding due to known AAA, non surgical candidate. Patient had a history of CVA in 2007, but no prior PE/DVT history. He does have a history of Raynauds's disease.  No prior history of cancer is reported. Denies tobacco use.He does have COPD.  Patient denies taking hormone replacement therapy. He was on ASA, Plavix , denies NSAIDs. Patient states that has never had a hematological evaluation prior to this admission. Never had a bone marrow biopsy. 2-D echo does not show any thrombotic findings. Patient was started on IV heparin once diagnosis of DVT/PE was made. Status was complicated by development of thrombocytopenia on 8/8, although it is slowly trending up again, today at 73k from 64k yesterday. Heparin was discontinued as HIT antibody is elevated at 2.744  (H). He is being transitioned to Xarelto per Pharmacy, then changed to [place] since 8/10 due to risk of  bleeding, in the setting of AAA for which he is not a surgical candidate.   Based on these findings we were requested to see patient in consultation with recommendations.   MEDICAL HISTORY:  Past Medical History  Diagnosis Date  . Hyperlipidemia   . Coronary artery disease   . AAA (abdominal aortic aneurysm)   . PAD (peripheral artery disease)   . COPD (chronic obstructive pulmonary disease)   . Raynaud's disease /phenomenon   . Lumbar degenerative disc disease   . Stroke 10/15/2005  . PONV (postoperative nausea and vomiting)   . Hypertension     takes meds daily  . Anxiety   . GERD (gastroesophageal reflux disease)   . History of blood transfusion   . Hyponatremia   . Myocardial infarction   . CHF (congestive heart failure)     SURGICAL HISTORY: Past Surgical History  Procedure Laterality Date  . Iliac artery stent  10/15/2005     Left common and external   . Cardiac catheterization  2006  . Carotid endarterectomy      bilateral  . Carotid endarterectomy  04/13/2005    Right and 06/03/2005 Left  . Abdominal angiogram  03/30/2012  . Hernia repair    . Abdominal aortagram N/A 03/30/2012    Procedure: ABDOMINAL Maxcine Ham;  Surgeon: Conrad Aneta, MD;  Location: [facility] CATH LAB;  Service: Cardiovascular;  Laterality: N/A;  . Cardiac catheterization N/A 12/09/2014    Procedure: Left Heart Cath and Coronary Angiography;  Surgeon: [name], MD;  Location: [facility] CV LAB;  Service: Cardiovascular;  Laterality:  N/A;  . Coronary artery bypass graft N/A 12/13/2014    Procedure: CORONARY ARTERY BYPASS GRAFT times five using left internal mammary artery and endoscopic right leg saphenous vein harvest;  Surgeon: Gaye Pollack, MD;  Location: [facility] OR;  Service: Open Heart Surgery;  Laterality: N/A;  . Intraoperative transesophageal echocardiogram N/A 12/13/2014    Procedure:  INTRAOPERATIVE TRANSESOPHAGEAL ECHOCARDIOGRAM;  Surgeon: Gaye Pollack, MD;  Location: [facility] OR;  Service: Open Heart Surgery;  Laterality: N/A;    SOCIAL HISTORY: Social History   Social History  . Marital Status: Married    Spouse Name: N/A  . Number of Children: N/A  . Years of Education: N/A   Occupational History  . MANAGER. owns [employer]    Social History Main Topics  . Smoking status: Former Smoker    Types: Cigarettes    Quit date: 05/24/2005  . Smokeless tobacco: Never Used  . Alcohol Use: 14.4 oz/week    24 Cans of beer per week     Comment: approximately 24 beer a week  . Drug Use: No  . Sexual Activity: Not on file   Other Topics Concern  . Not on file   Social History Narrative    FAMILY HISTORY: Family History  Problem Relation Age of Onset  . Coronary artery disease Father 77    HTN, Hyperlipidemia died  . Heart disease Father     Heart Disease before age 48  . Hypertension Father   . Hyperlipidemia Father   . Peripheral vascular disease Father   . Other Mother 75     died rheumatoid arthritis  . Dementia Mother   . Heart disease Brother   . Hypertension Brother   . Hyperlipidemia Brother     ALLERGIES:  is allergic to norvasc and pletal.  MEDICATIONS:  Scheduled Meds: . docusate sodium  100 mg Oral BID  . famotidine  20 mg Oral BID  . metoprolol tartrate  50 mg Oral BID  . potassium chloride  30 mEq Oral BID  . rosuvastatin  20 mg Oral QHS  . sodium chloride  3 mL Intravenous Q12H  . zolpidem  5 mg Oral Once   Continuous Infusions:  PRN Meds:.acetaminophen **OR** acetaminophen, alum & mag hydroxide-simeth, morphine injection, ondansetron **OR** ondansetron (ZOFRAN) IV, oxyCODONE   ROS: Constitutional: Denies fevers, chills or abnormal night sweats Eyes: Denies blurriness of vision, double vision or watery eyes Ears, nose, mouth, throat, and face: Denies mucositis or sore throat Respiratory: Denies cough, dyspnea or  wheezes Cardiovascular: Denies palpitation, chest discomfort ; lower extremity swelling and right calf pain as per HPI Gastrointestinal:  Denies nausea, heartburn or change in bowel habits Skin: Denies abnormal skin rashes Lymphatics: Denies new lymphadenopathy or easy bruising Neurological:Denies numbness, tingling or new weaknesses Behavioral/Psych: Mood is stable, no new changes  All other systems were reviewed with the patient and are negative.   Physical Exam     Filed Vitals:   01/01/15 1335  BP: 103/65  Pulse: 86  Temp: 98.5 F (36.9 C)  Resp: 18   Filed Weights   12/30/14 0417 12/31/14 0538 01/01/15 0643  Weight: 192 lb 14.4 oz (87.5 kg) 190 lb 7.6 oz (86.4 kg) 192 lb 0.3 oz (87.1 kg)    GENERAL:alert, no distress and comfortable SKIN: skin color, texture, turgor are normal, no rashes or significant lesions. Well healed sternal and venectomy scar. EYES: normal, conjunctiva are pink and non-injected, sclera clear OROPHARYNX:no exudate, no erythema and lips, buccal mucosa, and  tongue normal  NECK: supple, thyroid normal size, non-tender, without nodularity LYMPH:  no palpable lymphadenopathy in the cervical, axillary or inguinal LUNGS: clear to auscultation and percussion with normal breathing effort HEART: regular rate & rhythm and no murmurs and no lower extremity edema ABDOMEN:abdomen soft, non-tender and normal bowel sounds Musculoskeletal:no cyanosis of digits and no clubbing  PSYCH: alert & oriented x 3 with fluent speech NEURO: no focal motor/sensory deficits   LABORATORY DATA:    Recent Labs Lab 12/28/14 0348 12/28/14 2138 12/29/14 0448 12/30/14 0350 12/31/14 0337 01/01/15 0610  WBC 5.8  --  6.9 6.5 6.6 12.5*  HGB 6.7* 8.7* 8.5* 9.7* 10.2* 9.7*  HCT 19.5* 25.5* 24.6* 28.4* 29.9* 28.0*  PLT 134*  --  108* 84* 64* 73*  MCV 99.0  --  92.8 92.8 92.0 93.3  MCH 34.0  --  32.1 31.7 31.4 32.3  MCHC 34.4  --  34.6 34.2 34.1 34.6  RDW 13.6  --  16.8*  16.1* 15.3 15.0    Chemistries   Recent Labs Lab 12/27/14 1028 12/28/14 0348 12/30/14 0350 12/31/14 0337  NA 130* 131* 132* 131*  K 3.9 4.3 3.3* 3.3*  CL 93* 96* 98* 99*  CO2 25 26 22 24   GLUCOSE 106* 98 99 101*  BUN 24* 19 9 8   CREATININE 1.93* 1.59* 1.15 1.21  CALCIUM 8.7* 8.1* 8.2* 8.5*  MG  --   --   --  1.1*    Anemia panel:  No results for input(s): VITAMINB12, FOLATE, FERRITIN, TIBC, IRON, RETICCTPCT in the last 72 hours.  Coagulation profile  Recent Labs Lab 12/27/14 1028 12/28/14 0348  INR 1.27 1.44    Urine Studies  No results for input(s): UHGB, CRYS in the last 72 hours.  Invalid input(s): UACOL, UAPR, USPG, UPH, UTP, UGL, UKET, UBIL, UNIT, UROB, ULEU, UEPI, UWBC, URBC, UBAC, CAST, UCOM, BILUA   Radiology Studies:      Ct Angio Chest Pe W/cm &/or Wo Cm  01/01/2015   CLINICAL DATA:  Recent CABG procedure. Progressive right lower extremity swelling and discomfort. Right lower extremity DVT. Patient has dyspnea.  EXAM: CT ANGIOGRAPHY CHEST WITH CONTRAST  TECHNIQUE: Multidetector CT imaging of the chest was performed using the standard protocol during bolus administration of intravenous contrast. Multiplanar CT image reconstructions and MIPs were obtained to evaluate the vascular anatomy.  CONTRAST:  64m OMNIPAQUE IOHEXOL 350 MG/ML SOLN  COMPARISON:  Chest radiograph 12/27/2014 and chest CT 04/14/2012  FINDINGS: Study is positive for pulmonary embolism. There is clot in the distal right pulmonary artery with a large amount of clot extending into the right lower lobar arteries. Small amount of clot in the proximal right middle lobe pulmonary artery. Small amount of clot extending into the proximal right upper lobe pulmonary arteries. Normal caliber of the main pulmonary artery measuring 2.8 cm. There may be a small amount of clot involving a left lower lobe subsegmental branch. No significant enlargement of the right ventricle and no evidence for interventricular  septal deviation.  Postsurgical changes compatible with a CABG procedure. There are trace bilateral pleural effusions, left side greater than right. There is small amount of pericardial fluid.  Images of the upper abdomen again demonstrate a small lymph node with calcifications in the porta hepatis. No acute abnormality in the upper abdomen. There is no significant chest lymphadenopathy. Atherosclerotic calcifications involving the thoracic aorta and great vessels. The right brachiocephalic artery is heavily calcified.  The trachea and mainstem bronchi are patent.  Mild emphysematous changes in the upper lobes. There are patchy peripheral densities in the right lower lobe which are concerning for an infarct. Alternatively, this could be related to atelectasis. There is no significant airspace disease or consolidation in the left lung. Few peripheral densities in the left upper lobe are nonspecific.  No acute bone abnormality.  Old right rib fractures.  Review of the MIP images confirms the above findings.  IMPRESSION: Positive for pulmonary emboli. The clot burden is predominantly in the right pulmonary arteries, largest in the right lower lobe. Peripheral densities in the right lower lobe are concerning for an infarct.  Trace bilateral pleural effusions.  Postsurgical changes in the chest.  Critical Value/emergent results were called by telephone at the time of interpretation on 01/01/2015 at 1:45 pm to Dr. Domenic Polite , who verbally acknowledged these results.   Electronically Signed   By: Markus Daft M.D.   On: 01/01/2015 14:00    ASSESSMENT/PLAN:    1. Bilateral PE/ DVT:  Appears to be provoked by recent surgery, decreased mobility due to pain, recent [place] lines His other risk factors include prior stroke, CAD among other medical problems. His current anticoagulation therapy will interfere with some the tests and it is not possible to interpret the test results. Taking him off the anticoagulation  therapy to do the tests may precipitate another thrombotic event.  Due to development of HIT Ab, Heparinization was discontinued and his transition to Xarelto stopped.  He is on Arixtra since 8/10 by Pharmacy. Preventive measures such as frequent ambulation for long distance travel and aggressive DVT prophylaxis in all surgical settings is recommended. An appointment will be arranged upon discharge. If needed preoperatively, he need any interruption of his anticoagulation therapy for elective procedures.  Thrombocytopenia HIT Ab positive In the setting of recent surgery requiring anticoagulation, and heparinization during this admission In addition, he was diagnosed with HIT Ab, therefore Heparin dc'd. He is on Arixtra per Pharmacy Will await for the final HIT panel report    Anemia In the setting of recent surgery with post op blood loss, dilution, multiple draws. Hemoccult was negative He has received 2 units of PRBCs for Hb 6.9, currently at 9.7 Continue to transfuse to maintain Hb above 8 as patient is symptomatic.  Full Code  Other medical issues including hyponatremia, acute renal failure, HTN. CAD and AAA as per primary team and specialties Will follow    WERTMAN,SARA E, PA-C @T @ 2:26 PM   Patient seen and examined please see full note above. This is a pleasant 64 year old gentleman with extensive vascular disease including peripheral vascular disease and coronary artery disease. He is status post recent CABG on 12/13/2014. Upon his discharge his platelet count was within normal range and presented again for hospitalization on 12/26/2014. At that time he was found to have a deep vein thrombosis and was started on heparin. He did have a precipitous platelet dropped down to close to 60,000 from a normal range. Heparin has been discontinued last 3 days and initially was placed on Xarelto but currently on Arixtra. He was found to have a pulmonary embolism as well documented by a CT  scan. His platelet count did improve slightly upon discontinuation of heparin.  On physical examination, he was awake and alert gentleman slightly dyspneic. He did not appear in any active distress. His head was normocephalic without any trauma, pupils are equal round reactive to light and sclerae anicteric. Neck was supple without any lymphadenopathy. Heart was regular  rate and lungs are clear to auscultation. His abdomen is soft nontender without lower extremity edema noted.  Laboratory data were reviewed and detailed above. His CT scan images were also reviewed and showed pulmonary embolism.  Impression and recommendations:  64 year old gentleman with a deep vein thrombosis and was treated with heparin and developed what appeared to be heparin-induced thrombocytopenia. This was complicated by thrombosis with a pulmonary embolism. It is difficult to tell whether his DVT and PE all 1 episode or 2 separate episodes at this time. It will be difficult to differentiate the two.   At this time I agree with the current management. I would discontinue all heparin products including heparin flushes and prophylaxis dosing.  He will need full dose anticoagulation like you are doing with Arixtra.  If a cardiac intervention is needed I would recommend using bivalirudin or argatroban depending on his kidney and liver function.  He will need full dose anticoagulation with warfarin for at least 6 months. I will not start the warfarin for at least another 24 hours to ensure that he is fully anticoagulated before starting warfarin.  Please call with any questions.

## 2015-01-02 ENCOUNTER — Inpatient Hospital Stay (HOSPITAL_COMMUNITY): Payer: Medicare Other

## 2015-01-02 DIAGNOSIS — I82401 Acute embolism and thrombosis of unspecified deep veins of right lower extremity: Secondary | ICD-10-CM

## 2015-01-02 DIAGNOSIS — N170 Acute kidney failure with tubular necrosis: Secondary | ICD-10-CM

## 2015-01-02 LAB — SEROTONIN RELEASE ASSAY (SRA)
SRA .2 IU/mL UFH Ser-aCnc: 62 % — ABNORMAL HIGH (ref 0–20)
SRA 100IU/mL UFH Ser-aCnc: 1 % (ref 0–20)

## 2015-01-02 LAB — BASIC METABOLIC PANEL
Anion gap: 11 (ref 5–15)
BUN: 13 mg/dL (ref 6–20)
CHLORIDE: 99 mmol/L — AB (ref 101–111)
CO2: 23 mmol/L (ref 22–32)
Calcium: 8.8 mg/dL — ABNORMAL LOW (ref 8.9–10.3)
Creatinine, Ser: 1.33 mg/dL — ABNORMAL HIGH (ref 0.61–1.24)
GFR calc Af Amer: >60 mL/min (ref >60)
GFR calc non Af Amer: 54 mL/min — ABNORMAL LOW (ref >60)
GLUCOSE: 98 mg/dL (ref 65–99)
POTASSIUM: 4.2 mmol/L (ref 3.5–5.1)
Sodium: 133 mmol/L — ABNORMAL LOW (ref 135–145)

## 2015-01-02 LAB — CBC
HEMATOCRIT: 29 % — AB (ref 39.0–52.0)
Hemoglobin: 9.9 g/dL — ABNORMAL LOW (ref 13.0–17.0)
MCH: 32.5 pg (ref 26.0–34.0)
MCHC: 34.1 g/dL (ref 30.0–36.0)
MCV: 95.1 fL (ref 78.0–100.0)
Platelets: 99 10*3/uL — ABNORMAL LOW (ref 150–400)
RBC: 3.05 MIL/uL — ABNORMAL LOW (ref 4.22–5.81)
RDW: 15.1 % (ref 11.5–15.5)
WBC: 13 10*3/uL — ABNORMAL HIGH (ref 4.0–10.5)

## 2015-01-02 LAB — PROTIME-INR
INR: 2.03 — ABNORMAL HIGH (ref 0.00–1.49)
Prothrombin Time: 22.8 seconds — ABNORMAL HIGH (ref 11.6–15.2)

## 2015-01-02 LAB — URINALYSIS, ROUTINE W REFLEX MICROSCOPIC
Glucose, UA: NEGATIVE mg/dL
KETONES UR: 15 mg/dL — AB
Leukocytes, UA: NEGATIVE
Nitrite: NEGATIVE
Protein, ur: 100 mg/dL — AB
SPECIFIC GRAVITY, URINE: 1.026 (ref 1.005–1.030)
Urobilinogen, UA: >8 mg/dL — ABNORMAL HIGH (ref 0.0–1.0)
pH: 6.5 (ref 5.0–8.0)

## 2015-01-02 LAB — URINE MICROSCOPIC-ADD ON

## 2015-01-02 MED ORDER — WARFARIN - PHARMACIST DOSING INPATIENT
Freq: Every day | Status: DC
  Administered 2015-01-02 – 2015-01-04 (×2)

## 2015-01-02 MED ORDER — WARFARIN SODIUM 7.5 MG PO TABS
7.5000 mg | ORAL_TABLET | Freq: Once | ORAL | Status: AC
  Administered 2015-01-02: 7.5 mg via ORAL
  Filled 2015-01-02: qty 1

## 2015-01-02 MED ORDER — COUMADIN BOOK
Freq: Once | Status: AC
  Administered 2015-01-02: 17:00:00
  Filled 2015-01-02: qty 1

## 2015-01-02 MED ORDER — WARFARIN VIDEO
Freq: Once | Status: AC
  Administered 2015-01-02: 17:00:00

## 2015-01-02 NOTE — Progress Notes (Signed)
[facility] PROGRESS NOTE  [name] [id number] DOB: 1951/02/23 DOA: 12/27/2014 PCP: Donnie Coffin, MD   Narrative: [phone] s/p recent CABG presents with new LE DVT. Initially  On heparin gtt transitioned to xarelto. Pt developed thrombocytopenia. Hit Ab screen positive  Assessment/Plan:  Acute RLE DVT and PE -Appreciate CVTS input. -was on IV Heparin, this was transitioned to Xarelto on 8/8, due to drop in platelets -HIT panel positive, changed to Arixtra 8/10 -appreciate Heme Dr.Shadads consult -start warfarin tonight  Low grade fever -could be related to acute PE  Vs pulm infarcts -monitor   Anemia-post op blood loss anemia -Worsened postoperatively -hemoccult negative -Transfused 2 units PRBC with good response, stable  Thrombocytopenia - Heparin induced clinically and HIt Ab panel positive - stop xarelto, started Arixtra,  - FU Serotonin release assay  - appreciate consult per Dr.Shadad - plts improving   Acute renal failure/dehydration with hyponatremia -Seems related to recent addition of Lasix -Holding Lasix, improved with hydration, slightly worsened, bmet in am   Essential hypertension, benign -Current blood pressure controlled -ACE inhibitor held in setting of acute renal failure -Continue beta blocker   CAD/recent NSTEMI/S/P CABG x 5 (12/13/14) - Dr. Cyndia Bent following - Continue beta blocker, crestor   PAD  -Stable    AAA  -Very complex and due to degree of calcification has been deemed not electively operable -Followed by Dr. Sammuel Hines at [facility]  Constipation - Good improvement with laxatives  Code Status: Full Family Communication: wife at bedside Disposition Plan: Pending   Consultants:  CT Surgery  Procedures:    Antibiotics:    HPI/Subjective: Some cough, dyspnea, congestion  Objective: Filed Vitals:   01/01/15 1335 01/01/15 2100 01/02/15 0510 01/02/15 0941  BP: 103/65 139/78 140/77 134/63  Pulse: 86  102 104 105  Temp: 98.5 F (36.9 C) 100.8 F (38.2 C) 100 F (37.8 C) 100.2 F (37.9 C)  TempSrc: Oral Oral Oral   Resp: 18 18 17    Height:      Weight:   84.55 kg (186 lb 6.4 oz)   SpO2: 100% 95% 96%     Intake/Output Summary (Last 24 hours) at 01/02/15 1333 Last data filed at 01/02/15 0945  Gross per 24 hour  Intake      3 ml  Output    425 ml  Net   -422 ml   Filed Weights   12/31/14 0538 01/01/15 0643 01/02/15 0510  Weight: 86.4 kg (190 lb 7.6 oz) 87.1 kg (192 lb 0.3 oz) 84.55 kg (186 lb 6.4 oz)    Exam:   General:  Awake, in nad  Cardiovascular: regular, s1, s2  Respiratory: normal resp effort, poor air movement b/l  Abdomen: soft,nondistended, pos BS  Musculoskeletal: RLE 1plus edema  Data Reviewed: Basic Metabolic Panel:  Recent Labs Lab 12/27/14 1028 12/28/14 0348 12/30/14 0350 12/31/14 0337 01/02/15 0449  NA 130* 131* 132* 131* 133*  K 3.9 4.3 3.3* 3.3* 4.2  CL 93* 96* 98* 99* 99*  CO2 25 26 22 24 23   GLUCOSE 106* 98 99 101* 98  BUN 24* 19 9 8 13   CREATININE 1.93* 1.59* 1.15 1.21 1.33*  CALCIUM 8.7* 8.1* 8.2* 8.5* 8.8*  MG  --   --   --  1.1*  --    Liver Function Tests:  Recent Labs Lab 12/27/14 1028  AST 24  ALT 17  ALKPHOS 68  BILITOT 1.4*  PROT 6.2*  ALBUMIN 2.8*   No results for input(s): LIPASE,  AMYLASE in the last 168 hours. No results for input(s): AMMONIA in the last 168 hours. CBC:  Recent Labs Lab 12/29/14 0448 12/30/14 0350 12/31/14 0337 01/01/15 0610 01/02/15 0449  WBC 6.9 6.5 6.6 12.5* 13.0*  HGB 8.5* 9.7* 10.2* 9.7* 9.9*  HCT 24.6* 28.4* 29.9* 28.0* 29.0*  MCV 92.8 92.8 92.0 93.3 95.1  PLT 108* 84* 64* 73* 99*   Cardiac Enzymes:  Recent Labs Lab 12/27/14 1028  CKTOTAL 43*   BNP (last 3 results)  Recent Labs  12/27/14 1028  BNP 568.1*    ProBNP (last 3 results)  Recent Labs  11/08/14 1544  PROBNP 179.0*    CBG: No results for input(s): GLUCAP in the last 168 hours.  No results found  for this or any previous visit (from the past 240 hour(s)).   Studies: Dg Chest 2 View  01/02/2015   CLINICAL DATA:  Pleural effusion  EXAM: CHEST - 2 VIEW  COMPARISON:  01/01/2015, 12/27/2014  FINDINGS: Cardiac shadow is mildly enlarged in size. Postsurgical changes are seen. The lungs are hyperinflated consistent with COPD. Small bilateral pleural effusions are noted similar to that seen on recent CT examination.  IMPRESSION: Small pleural effusions.  Changes of COPD.   Electronically Signed   By: Inez Catalina M.D.   On: 01/02/2015 08:38   Ct Angio Chest Pe W/cm &/or Wo Cm  01/01/2015   CLINICAL DATA:  Recent CABG procedure. Progressive right lower extremity swelling and discomfort. Right lower extremity DVT. Patient has dyspnea.  EXAM: CT ANGIOGRAPHY CHEST WITH CONTRAST  TECHNIQUE: Multidetector CT imaging of the chest was performed using the standard protocol during bolus administration of intravenous contrast. Multiplanar CT image reconstructions and MIPs were obtained to evaluate the vascular anatomy.  CONTRAST:  64mL OMNIPAQUE IOHEXOL 350 MG/ML SOLN  COMPARISON:  Chest radiograph 12/27/2014 and chest CT 04/14/2012  FINDINGS: Study is positive for pulmonary embolism. There is clot in the distal right pulmonary artery with a large amount of clot extending into the right lower lobar arteries. Small amount of clot in the proximal right middle lobe pulmonary artery. Small amount of clot extending into the proximal right upper lobe pulmonary arteries. Normal caliber of the main pulmonary artery measuring 2.8 cm. There may be a small amount of clot involving a left lower lobe subsegmental branch. No significant enlargement of the right ventricle and no evidence for interventricular septal deviation.  Postsurgical changes compatible with a CABG procedure. There are trace bilateral pleural effusions, left side greater than right. There is small amount of pericardial fluid.  Images of the upper abdomen again  demonstrate a small lymph node with calcifications in the porta hepatis. No acute abnormality in the upper abdomen. There is no significant chest lymphadenopathy. Atherosclerotic calcifications involving the thoracic aorta and great vessels. The right brachiocephalic artery is heavily calcified.  The trachea and mainstem bronchi are patent. Mild emphysematous changes in the upper lobes. There are patchy peripheral densities in the right lower lobe which are concerning for an infarct. Alternatively, this could be related to atelectasis. There is no significant airspace disease or consolidation in the left lung. Few peripheral densities in the left upper lobe are nonspecific.  No acute bone abnormality.  Old right rib fractures.  Review of the MIP images confirms the above findings.  IMPRESSION: Positive for pulmonary emboli. The clot burden is predominantly in the right pulmonary arteries, largest in the right lower lobe. Peripheral densities in the right lower lobe are concerning for an  infarct.  Trace bilateral pleural effusions.  Postsurgical changes in the chest.  Critical Value/emergent results were called by telephone at the time of interpretation on 01/01/2015 at 1:45 pm to Dr. Domenic Polite , who verbally acknowledged these results.   Electronically Signed   By: Markus Daft M.D.   On: 01/01/2015 14:00    Scheduled Meds: . docusate sodium  100 mg Oral BID  . famotidine  20 mg Oral BID  . fondaparinux (ARIXTRA) injection  7.5 mg Subcutaneous Q0600  . metoprolol tartrate  50 mg Oral BID  . potassium chloride  30 mEq Oral BID  . rosuvastatin  20 mg Oral QHS  . sodium chloride  3 mL Intravenous Q12H  . zolpidem  5 mg Oral Once   Continuous Infusions:    Principal Problem:   Acute DVT (deep venous thrombosis) Active Problems:   Essential hypertension, benign   Hyperlipidemia with target LDL less than 70   PAD (peripheral artery disease)   AAA (abdominal aortic aneurysm)   CAD (coronary artery  disease)   S/P CABG x 5 12/13/14   Anemia   Acute renal failure   AKI (acute kidney injury)   Absolute anemia    [facility]  [facility] Pager [phone]. If 7PM-7AM, please contact night-coverage at [email or web address], password [email or web address] 01/02/2015, 1:33 PM  LOS: 6 days

## 2015-01-02 NOTE — Progress Notes (Signed)
Patient complains of having difficulty urinating, however refuses to allow his bladder to be scanned; oriented only to self. Will continue to monitor.

## 2015-01-02 NOTE — Progress Notes (Addendum)
      [address]       [place] [address]             [phone]             Subjective: Patient's only complaint of right calf pain   Objective: Vital signs in last 24 hours: Temp:  [98.5 F (36.9 C)-101 F (38.3 C)] 100 F (37.8 C) (08/11 0510) Pulse Rate:  [86-110] 104 (08/11 0510) Cardiac Rhythm:  [-] Normal sinus rhythm (08/10 2030) Resp:  [17-18] 17 (08/11 0510) BP: (103-145)/(65-78) 140/77 mmHg (08/11 0510) SpO2:  [95 %-100 %] 96 % (08/11 0510) Weight:  [186 lb 6.4 oz (84.55 kg)] 186 lb 6.4 oz (84.55 kg) (08/11 0510)   Current Weight  01/02/15 186 lb 6.4 oz (84.55 kg)      Intake/Output from previous day: 08/10 0701 - 08/11 0700 In: 600 [P.O.:600] Out: 425 [Urine:425]   Physical Exam:  Cardiovascular: Slightly tachycardic Pulmonary: Clear to auscultation bilaterally; no rales, wheezes, or rhonchi. Abdomen: Soft, non tender, bowel sounds present. Extremities: Right lower extremity edema Wounds: Clean and dry.  No erythema or signs of infection.  Lab Results: CBC:  Recent Labs  01/01/15 0610 01/02/15 0449  WBC 12.5* 13.0*  HGB 9.7* 9.9*  HCT 28.0* 29.0*  PLT 73* 99*   BMET:   Recent Labs  12/31/14 0337 01/02/15 0449  NA 131* 133*  K 3.3* 4.2  CL 99* 99*  CO2 24 23  GLUCOSE 101* 98  BUN 8 13  CREATININE 1.21 1.33*  CALCIUM 8.5* 8.8*    PT/INR:  Lab Results  Component Value Date   INR 1.44 12/28/2014   INR 1.27 12/27/2014   INR 1.46 12/13/2014   ABG:  INR: Will add last result for INR, ABG once components are confirmed Will add last 4 CBG results once components are confirmed  Assessment/Plan:  1. CV - Slightly tachy in the low 100's. On Lopressor 50 mg bid . 2.  Right DVT and PE 3. Anemia-H and H stable at 9.9 and 29. 4. Thrombocytopenia-platelets increased to 99,000. HIT 2.744 On Arixtra 5. Fever to 100.8 and WBC slightly elevated at 13,000. Check CXR and UA. No signs of wound  infection.  ZIMMERMAN,DONIELLE MPA-C 01/02/2015,7:29 AM    Chart reviewed, patient examined, agree with above.

## 2015-01-02 NOTE — Progress Notes (Signed)
ANTICOAGULATION CONSULT NOTE - Initial Consult  Pharmacy Consult for Coumadin Indication: PE/DVT  Allergies  Allergen Reactions  . Norvasc [Amlodipine Besylate] Swelling       . Pletal [Cilostazol] Diarrhea    Patient Measurements: Height: 5\' 11"  (180.3 cm) Weight: 186 lb 6.4 oz (84.55 kg) IBW/kg (Calculated) : 75.3   Vital Signs: Temp: 100.2 F (37.9 C) (08/11 0941) Temp Source: Oral (08/11 0510) BP: 153/72 mmHg (08/11 1346) Pulse Rate: 87 (08/11 1346)  Labs:  Recent Labs  12/31/14 0337 01/01/15 0610 01/02/15 0449 01/02/15 1554  HGB 10.2* 9.7* 9.9*  --   HCT 29.9* 28.0* 29.0*  --   PLT 64* 73* 99*  --   LABPROT  --   --   --  22.8*  INR  --   --   --  2.03*  CREATININE 1.21  --  1.33*  --     Estimated Creatinine Clearance: 58.2 mL/min (by C-G formula based on Cr of 1.33).   Medical History: Past Medical History  Diagnosis Date  . Hyperlipidemia   . Coronary artery disease   . AAA (abdominal aortic aneurysm)   . PAD (peripheral artery disease)   . COPD (chronic obstructive pulmonary disease)   . Raynaud's disease /phenomenon   . Lumbar degenerative disc disease   . Stroke 10/15/2005  . PONV (postoperative nausea and vomiting)   . Hypertension     takes meds daily  . Anxiety   . GERD (gastroesophageal reflux disease)   . History of blood transfusion   . Hyponatremia   . Myocardial infarction   . CHF (congestive heart failure)   }  Assessment: 64 y/o M s/p CABG 3 wks ago presents s/p fall last PM and with B leg swelling/pain. Doppler + for R DVT.  Anticoag: Plt 64>73>99, HIT antibody +, SRA +. CTA shows PE. Now on arixtra. 8/11: Plts up to 99. Baseline INR 2.03. (Last dose of Xarelto 8/10 causing elevation of INR). May start Warfarin tonight when next dose of Xarelto was due. INR will not be truly reflective of warfarin until Xarelto cleared and warfarin loaded.   Goal of Therapy:  INR 2-3 Monitor platelets by anticoagulation protocol: Yes    Plan:  - Arixtra 7.5 mg daily (Continue until INR therapeutic x 24hrs) - Warfarin 7.5 mg x 1 tonight - Monitor CBC, s/sx of bleeding, and renal function closely - Daily PT/INR   Jonty Morrical S. Alford Highland, PharmD, BCPS Clinical Staff Pharmacist Pager [phone]  Eilene Ghazi Stillinger 01/02/2015,4:41 PM

## 2015-01-02 NOTE — Progress Notes (Signed)
CARDIAC REHAB PHASE I   PRE:  Rate/Rhythm: 92 SR  BP:  Supine: 138/63  Sitting:   Standing:    SaO2: 97%RA  MODE:  Ambulation: 150 ft   POST:  Rate/Rhythm: 104 ST  BP:  Supine:   Sitting: 147/72  Standing:    SaO2: 98%RA 1410-1430 Pt walked 150 ft with gait belt use, rolling walker and asst x 2. One asst to follow with wheelchair. Pt did not need to sit. Encouraged to stand more upright and stay close to walker. To recliner after walk. Encouraged pt to walk with PT later.   Graylon Good, RN BSN  01/02/2015 2:26 PM

## 2015-01-02 NOTE — Progress Notes (Signed)
Laboratory data reviewed and appear that his blood counts is improving off heparin. His platelet count is up to 99,000 with a hemoglobin of 9.9. His heparin antibody or positive indicating the presence of HIT  I agree with the current management andwould recommend continue Arixtra for the time being. The options of long term anticoagulation will be Arixtra for at least 6 months versus oral warfarin. It would be reasonable to start dosing him with warfarin if this is the preferred method of long term anticoagulation while on Arixtra.  Please call with questions.

## 2015-01-02 NOTE — Care Management Important Message (Signed)
Important Message  Patient Details  Name: NYHEIM CHAFFER MRN: [id number] Date of Birth: [birth year]   Medicare Important Message Given:  Yes-third notification given    Nathen May 01/02/2015, 2:49 [place] Message  Patient Details  Name: USIEL ZUEGE MRN: [id number] Date of Birth: 01/23/1951   Medicare Important Message Given:  Yes-third notification given    Nathen May 01/02/2015, 2:48 PM

## 2015-01-02 NOTE — Progress Notes (Addendum)
Physical Therapy Treatment Patient Details Name: [name] MRN: [id number] DOB: 02-15-1951 Today's Date: 01/02/2015    History of Present Illness Pt is a 64 y/o male with a recent NSTEMI and subsequent CABG x5 with vein harvesting from the RLE on 12/13/14. He presents with progressive RLE swelling and discomfort, and a fall in the early morning of admission. Pt was found to have extensive acute DVT in the RLE involving R distal femoral vein, popliteal vein, posterior tibial veins and gastrocnemius veins. 8/10 found to have multiple PE's (primarily Rt lower lung); on Heparin developed HIT with decr platelets; switched to Arixtra    PT Comments    Patient lethargic with frequent eye closing (wife reports very sleepy today with fever persisting). Increased RLE pain in standing with pt self-selecting TDWB RLE and full weight-bearing through his arms on RW. Due to sternal precautions, encouraged incr weight-bearing through RLE and pt unable to tolerate at this time. Tolerated leg exercises with max encouragement.    Follow Up Recommendations  Home health PT;Supervision/Assistance - 24 hour (wife wants him to go home; has been confused overnight)     Equipment Recommendations  None recommended by PT    Recommendations for Other Services Other (comment) (Cardiac rehab)     Precautions / Restrictions Precautions Precautions: Fall;Sternal Precaution Comments: Pt requires total cues to recall precautions Restrictions Weight Bearing Restrictions:  (Sternal) RLE Weight Bearing: Weight bearing as tolerated    Mobility  Bed Mobility                  Transfers Overall transfer level: Needs assistance Equipment used: Rolling walker (2 wheeled) Transfers: Sit to/from Stand Sit to Stand: Min assist;From elevated surface (pillow in recliner)         General transfer comment: Assist required to power-up to full standing position. Pt pushed from knees as he was unable to rise  without UE support. Max VC's for sternal precautions (pt trying to push through his elbows on armrests, multiple attempts to use UEs to pull on armrests)  Ambulation/Gait             General Gait Details: recently walked with Cardiac Rehab; pt with incr Rt calf pain and barely putting any weight on RLE with full weight on his arms; frequent eye closing; worked on standing balance to pt's tolerance and returned to sitting   Stairs            Wheelchair Mobility    Modified Rankin (Stroke Patients Only)       Balance Overall balance assessment: Needs assistance Sitting-balance support: Feet supported Sitting balance-Leahy Scale: Fair     Standing balance support: Bilateral upper extremity supported Standing balance-Leahy Scale: Poor Standing balance comment: only able to let go with one UE from RW at a time; grimaces with incr pressure on RLE and again grabs RW; stood x 90 seconds                    Cognition Arousal/Alertness: Awake/alert Behavior During Therapy: Flat affect Overall Cognitive Status: Impaired/Different from baseline Area of Impairment: Memory;Safety/judgement;Awareness;Problem solving     Memory: Decreased recall of precautions   Safety/Judgement: Decreased awareness of safety;Decreased awareness of deficits Awareness: Emergent Problem Solving: Slow processing;Decreased initiation;Difficulty sequencing;Requires verbal cues      Exercises General Exercises - Lower Extremity Ankle Circles/Pumps: AROM;Both;10 reps Quad Sets: AROM;Both;10 reps Long Arc Quad: AROM;Both;5 reps Heel Raises: AROM;Both;5 reps;Limitations;Seated Heel Raises Limitations: tolerated minimal lifting of Rt heel  by pressing down through his toes against floor Mini-Sqauts:  (unable due to RLE pain)    General Comments        Pertinent Vitals/Pain Pain Assessment: Faces Faces Pain Scale: Hurts even more Pain Location: Rt calf Pain Descriptors / Indicators:  Guarding;Grimacing Pain Intervention(s): Limited activity within patient's tolerance;Monitored during session;Repositioned    Home Living                      Prior Function            PT Goals (current goals can now be found in the care plan section) Acute Rehab PT Goals Patient Stated Goal: to return home PT Goal Formulation: With patient/family Time For Goal Achievement: 01/07/15 Potential to Achieve Goals: Good Progress towards PT goals: Not progressing toward goals - comment (incr RLE pain)    Frequency  Min 3X/week    PT Plan Current plan remains appropriate    Co-evaluation             End of Session Equipment Utilized During Treatment: Gait belt Activity Tolerance: Patient limited by fatigue;No increased pain Patient left: in chair;with call bell/phone within reach;with family/visitor present     Time: [id number] PT Time Calculation (min) (ACUTE ONLY): 29 min  Charges:  $Therapeutic Exercise: 8-22 mins $Therapeutic Activity: 8-22 mins                    G Codes:      [name] 11-Jan-2015, 4:00 PM  Pager [phone]

## 2015-01-03 DIAGNOSIS — I2699 Other pulmonary embolism without acute cor pulmonale: Secondary | ICD-10-CM | POA: Diagnosis present

## 2015-01-03 LAB — CBC
HCT: 27.2 % — ABNORMAL LOW (ref 39.0–52.0)
Hemoglobin: 9.1 g/dL — ABNORMAL LOW (ref 13.0–17.0)
MCH: 31.6 pg (ref 26.0–34.0)
MCHC: 33.5 g/dL (ref 30.0–36.0)
MCV: 94.4 fL (ref 78.0–100.0)
PLATELETS: 117 10*3/uL — AB (ref 150–400)
RBC: 2.88 MIL/uL — ABNORMAL LOW (ref 4.22–5.81)
RDW: 14.9 % (ref 11.5–15.5)
WBC: 10.4 10*3/uL (ref 4.0–10.5)

## 2015-01-03 LAB — BASIC METABOLIC PANEL
Anion gap: 8 (ref 5–15)
BUN: 17 mg/dL (ref 6–20)
CO2: 25 mmol/L (ref 22–32)
CREATININE: 1.47 mg/dL — AB (ref 0.61–1.24)
Calcium: 8.7 mg/dL — ABNORMAL LOW (ref 8.9–10.3)
Chloride: 101 mmol/L (ref 101–111)
GFR calc Af Amer: 56 mL/min — ABNORMAL LOW (ref >60)
GFR, EST NON AFRICAN AMERICAN: 48 mL/min — AB (ref >60)
Glucose, Bld: 96 mg/dL (ref 65–99)
Potassium: 4.8 mmol/L (ref 3.5–5.1)
Sodium: 134 mmol/L — ABNORMAL LOW (ref 135–145)

## 2015-01-03 LAB — PROTIME-INR
INR: 1.83 — ABNORMAL HIGH (ref 0.00–1.49)
PROTHROMBIN TIME: 21.1 s — AB (ref 11.6–15.2)

## 2015-01-03 MED ORDER — SODIUM CHLORIDE 0.9 % IV SOLN
INTRAVENOUS | Status: DC
  Administered 2015-01-03: 10:00:00 via INTRAVENOUS

## 2015-01-03 MED ORDER — WARFARIN SODIUM 7.5 MG PO TABS
7.5000 mg | ORAL_TABLET | Freq: Once | ORAL | Status: AC
  Administered 2015-01-03: 7.5 mg via ORAL
  Filled 2015-01-03: qty 1

## 2015-01-03 NOTE — Progress Notes (Addendum)
      [address]       [place] [address]             [phone]             Subjective: Patient states has less right calf pain   Objective: Vital signs in last 24 hours: Temp:  [99.7 F (37.6 C)-100.2 F (37.9 C)] 100 F (37.8 C) (08/12 0518) Pulse Rate:  [87-105] 94 (08/12 0518) Cardiac Rhythm:  [-] Normal sinus rhythm (08/11 2010) Resp:  [18] 18 (08/12 0518) BP: (124-153)/(63-72) 124/65 mmHg (08/12 0518) SpO2:  [93 %-98 %] 98 % (08/12 0518) Weight:  [185 lb 6.5 oz (84.1 kg)] 185 lb 6.5 oz (84.1 kg) (08/12 0518)   Current Weight  01/03/15 185 lb 6.5 oz (84.1 kg)      Intake/Output from previous day: 08/11 0701 - 08/12 0700 In: 3 [I.V.:3] Out: 100 [Urine:100]   Physical Exam:  Cardiovascular: Slightly tachycardic Pulmonary: Clear to auscultation bilaterally; no rales, wheezes, or rhonchi. Abdomen: Soft, non tender, bowel sounds present. Extremities: Right lower extremity edema Wounds: Clean and dry.  No erythema or signs of infection.  Lab Results: CBC:  Recent Labs  01/02/15 0449 01/03/15 0432  WBC 13.0* 10.4  HGB 9.9* 9.1*  HCT 29.0* 27.2*  PLT 99* 117*   BMET:   Recent Labs  01/02/15 0449 01/03/15 0432  NA 133* 134*  K 4.2 4.8  CL 99* 101  CO2 23 25  GLUCOSE 98 96  BUN 13 17  CREATININE 1.33* 1.47*  CALCIUM 8.8* 8.7*    PT/INR:  Lab Results  Component Value Date   INR 1.83* 01/03/2015   INR 2.03* 01/02/2015   INR 1.44 12/28/2014   ABG:  INR: Will add last result for INR, ABG once components are confirmed Will add last 4 CBG results once components are confirmed  Assessment/Plan:  1. CV - Slightly tachy in the low 100's. On Lopressor 50 mg bid . 2.  Right DVT and PE 3. Anemia-H and H stable at 9.1 and 27.2. 4. Thrombocytopenia-platelets increased to 117,000. HIT 2.744 On Arixtra 5. Fever to 100.2 and WBC decreased to 10,400. Questionable etiology of fever as CXR showed small pleural effusions and  COPD. UA was negative for leukocytes. No signs of wound infection. 6. Creatinine up from 1.33 to 1.47. No on diuretic or ACE. Questionable if related to [place] MPA-C 01/03/2015,7:34 AM   Chart reviewed, patient examined, agree with above. He feels ok. Still has some right leg swelling and pain. He needs to continue mobilization. Coumadin started last pm. He is stable from a surgical standpoint. When he goes home I want to see him 2-3 wks later. His INR can be followed in the [facility].

## 2015-01-03 NOTE — Progress Notes (Signed)
UR Completed. Mercadez Heitman, RN, BSN.  [phone] 

## 2015-01-03 NOTE — Progress Notes (Signed)
Physical Therapy Treatment Patient Details Name: [name] MRN: [id number] DOB: 06-Jan-1951 Today's Date: 01/03/2015    History of Present Illness Pt is a 64 y/o male with a recent NSTEMI and subsequent CABG x5 with vein harvesting from the RLE on 12/13/14. He presents with progressive RLE swelling and discomfort, and a fall in the early morning of admission. Pt was found to have extensive acute DVT in the RLE involving R distal femoral vein, popliteal vein, posterior tibial veins and gastrocnemius veins. 8/10 found to have multiple PE's (primarily Rt lower lung); on Heparin developed HIT with decr platelets; switched to Arixtra    PT Comments    Pt progressing towards physical therapy goals. Continues to appear confused at times and delayed with responding to therapist. Was able to walk all the way around nurses station this session (150') and fatigued fairly quickly. Wife was following with the chair however pt did not require a seated rest break. Will continue to follow and progress as able per POC.   Follow Up Recommendations  Home health PT;Supervision/Assistance - 24 hour (wife wants him to go home; has been confused overnight)     Equipment Recommendations  None recommended by PT    Recommendations for Other Services Other (comment) (Cardiac rehab)     Precautions / Restrictions Precautions Precautions: Fall;Sternal Precaution Comments: Pt requires total cues to recall precautions Restrictions Weight Bearing Restrictions: Yes (Sternal) RLE Weight Bearing: Weight bearing as tolerated    Mobility  Bed Mobility               General bed mobility comments: Pt sitting up in recliner upon PT arrival  Transfers Overall transfer level: Needs assistance Equipment used: Rolling walker (2 wheeled) Transfers: Sit to/from Stand Sit to Stand: Min assist (pillow in recliner)         General transfer comment: Assist required to power-up to full standing position. Pt pushed  from knees as he was unable to rise without UE support. Max VC's for sternal precautions (pt trying to push through his elbows on armrests, multiple attempts to use UEs to pull on armrests)  Ambulation/Gait Ambulation/Gait assistance: Min guard Ambulation Distance (Feet): 150 Feet Assistive device: Rolling walker (2 wheeled) Gait Pattern/deviations: Step-through pattern;Decreased stride length;Trunk flexed Gait velocity: Decreased Gait velocity interpretation: Below normal speed for age/gender General Gait Details: Pt motivated for distance. Wife followed with the chair but pt did not require a seated rest break. Appeared fatigued at end of gait training.    Stairs            Wheelchair Mobility    Modified Rankin (Stroke Patients Only)       Balance Overall balance assessment: Needs assistance Sitting-balance support: Feet supported;No upper extremity supported Sitting balance-Leahy Scale: Fair     Standing balance support: Bilateral upper extremity supported Standing balance-Leahy Scale: Poor                      Cognition Arousal/Alertness: Awake/alert Behavior During Therapy: Flat affect Overall Cognitive Status: Impaired/Different from baseline Area of Impairment: Memory;Safety/judgement;Awareness;Problem solving     Memory: Decreased recall of precautions   Safety/Judgement: Decreased awareness of safety;Decreased awareness of deficits Awareness: Emergent Problem Solving: Slow processing;Decreased initiation;Difficulty sequencing;Requires verbal cues      Exercises      General Comments        Pertinent Vitals/Pain Pain Assessment: No/denies pain    Home Living  Prior Function            PT Goals (current goals can now be found in the care plan section) Acute Rehab PT Goals Patient Stated Goal: to return home PT Goal Formulation: With patient/family Time For Goal Achievement: 01/07/15 Potential to  Achieve Goals: Good Progress towards PT goals: Progressing toward goals    Frequency  Min 3X/week    PT Plan Current plan remains appropriate    Co-evaluation             End of Session Equipment Utilized During Treatment: Gait belt Activity Tolerance: Patient limited by fatigue;No increased pain Patient left: in chair;with call bell/phone within reach;with family/visitor present     Time: [id number] PT Time Calculation (min) (ACUTE ONLY): 19 min  Charges:  $Gait Training: 8-22 mins                    G Codes:      Rolinda Roan 01-21-2015, 3:22 PM   Rolinda Roan, PT, DPT Acute Rehabilitation Services Pager: [phone]

## 2015-01-03 NOTE — Progress Notes (Signed)
[facility] PROGRESS NOTE  [name] [id number] DOB: Jul 30, 1950 DOA: 12/27/2014 PCP: Donnie Coffin, MD   Narrative: [phone] s/p recent CABG presents with new LE DVT. Initially  On heparin gtt transitioned to xarelto. Pt developed thrombocytopenia. Hit Ab screen positive Now with ACute PE, on Arixtra, also low garde temps  Assessment/Plan:  Acute RLE DVT and PE -Appreciate CVTS input. -was on IV Heparin, this was transitioned to Xarelto on 8/8, due to drop in platelets -HIT panel positive, changed to Arixtra 8/10 -appreciate Heme Dr.Shadads consult -started warfarin 8/11 -improving  Low grade fever -could be related to acute PE  Vs pulm infarcts -monitor, WBC better today   Anemia-post op blood loss anemia -Worsened postoperatively -hemoccult negative -Transfused 2 units PRBC with good response, stable  Thrombocytopenia - Heparin induced clinically and HIt Ab panel positive - stopped xarelto, started Arixtra 8/10 - FU Serotonin release assay  - appreciate consult per Dr.Shadad - plts improving   Acute renal failure/dehydration with hyponatremia -Seems related to recent addition of Lasix -Holding Lasix, improved with hydration, slightly worsened again, will start IVF, bmet in am   Essential hypertension, benign -Current blood pressure controlled -ACE inhibitor held in setting of acute renal failure -Continue beta blocker   CAD/recent NSTEMI/S/P CABG x 5 (12/13/14) - Dr. Cyndia Bent following - Continue beta blocker, crestor   PAD  -Stable    AAA  -Very complex and due to degree of calcification has been deemed not electively operable -Follow[facility]el [facility]  Constipation - Good improvement with laxatives  Code Status: Full Family Communication: wife at bedside Disposition Plan: home when stable   Consultants:  CT Surgery  Procedures:    Antibiotics:    HPI/Subjective: Low garde temp overnight, breathing  better  Objective: Filed Vitals:   01/03/15 1011 01/03/15 1145 01/03/15 1159 01/03/15 1207  BP: 105/62  151/86   Pulse: 109  98   Temp:  98.7 F (37.1 C) 98.8 F (37.1 C) 99.3 F (37.4 C)  TempSrc:   Oral Oral  Resp:   18   Height:      Weight:      SpO2:   98%     Intake/Output Summary (Last 24 hours) at 01/03/15 1329 Last data filed at 01/03/15 1220  Gross per 24 hour  Intake      0 ml  Output    100 ml  Net   -100 ml   Filed Weights   01/01/15 0643 01/02/15 0510 01/03/15 0518  Weight: 87.1 kg (192 lb 0.3 oz) 84.55 kg (186 lb 6.4 oz) 84.1 kg (185 lb 6.5 oz)    Exam:   General:  Awake, in nad  Cardiovascular: regular, s1, s2  Respiratory: normal resp effort, poor air movement b/l  Abdomen: soft,nondistended, pos BS  Musculoskeletal: RLE 1plus edema  Data Reviewed: Basic Metabolic Panel:  Recent Labs Lab 12/28/14 0348 12/30/14 0350 12/31/14 0337 01/02/15 0449 01/03/15 0432  NA 131* 132* 131* 133* 134*  K 4.3 3.3* 3.3* 4.2 4.8  CL 96* 98* 99* 99* 101  CO2 26 22 24 23 25   GLUCOSE 98 99 101* 98 96  BUN 19 9 8 13 17   CREATININE 1.59* 1.15 1.21 1.33* 1.47*  CALCIUM 8.1* 8.2* 8.5* 8.8* 8.7*  MG  --   --  1.1*  --   --    Liver Function Tests: No results for input(s): AST, ALT, ALKPHOS, BILITOT, PROT, ALBUMIN in the last 168 hours. No results  for input(s): LIPASE, AMYLASE in the last 168 hours. No results for input(s): AMMONIA in the last 168 hours. CBC:  Recent Labs Lab 12/30/14 0350 12/31/14 0337 01/01/15 0610 01/02/15 0449 01/03/15 0432  WBC 6.5 6.6 12.5* 13.0* 10.4  HGB 9.7* 10.2* 9.7* 9.9* 9.1*  HCT 28.4* 29.9* 28.0* 29.0* 27.2*  MCV 92.8 92.0 93.3 95.1 94.4  PLT 84* 64* 73* 99* 117*   Cardiac Enzymes: No results for input(s): CKTOTAL, CKMB, CKMBINDEX, TROPONINI in the last 168 hours. BNP (last 3 results)  Recent Labs  12/27/14 1028  BNP 568.1*    ProBNP (last 3 results)  Recent Labs  11/08/14 1544  PROBNP 179.0*     CBG: No results for input(s): GLUCAP in the last 168 hours.  No results found for this or any previous visit (from the past 240 hour(s)).   Studies: Dg Chest 2 View  01/02/2015   CLINICAL DATA:  Pleural effusion  EXAM: CHEST - 2 VIEW  COMPARISON:  01/01/2015, 12/27/2014  FINDINGS: Cardiac shadow is mildly enlarged in size. Postsurgical changes are seen. The lungs are hyperinflated consistent with COPD. Small bilateral pleural effusions are noted similar to that seen on recent CT examination.  IMPRESSION: Small pleural effusions.  Changes of COPD.   Electronically Signed   By: Inez Catalina M.D.   On: 01/02/2015 08:38    Scheduled Meds: . docusate sodium  100 mg Oral BID  . famotidine  20 mg Oral BID  . fondaparinux (ARIXTRA) injection  7.5 mg Subcutaneous Q0600  . metoprolol tartrate  50 mg Oral BID  . potassium chloride  30 mEq Oral BID  . rosuvastatin  20 mg Oral QHS  . sodium chloride  3 mL Intravenous Q12H  . warfarin  7.5 mg Oral ONCE-1800  . Warfarin - Pharmacist Dosing Inpatient   Does not apply q1800  . zolpidem  5 mg Oral Once   Continuous Infusions: . sodium chloride 75 mL/hr at 01/03/15 1220    Principal Problem:   Acute DVT (deep venous thrombosis) Active Problems:   Essential hypertension, benign   Hyperlipidemia with target LDL less than 70   PAD (peripheral artery disease)   AAA (abdominal aortic aneurysm)   CAD (coronary artery disease)   S/P CABG x 5 12/13/14   Anemia   Acute renal failure   AKI (acute kidney injury)   Absolute anemia    [name]  [facility] Pager [phone]. If 7PM-7AM, please contact night-coverage at [email or web address], password [facility] 01/03/2015, 1:29 PM  LOS: 7 days

## 2015-01-03 NOTE — Progress Notes (Signed)
Pt appears much more alert and brighter at this time. His chills resolved .

## 2015-01-03 NOTE — Progress Notes (Signed)
ANTICOAGULATION CONSULT NOTE - Initial Consult  Pharmacy Consult for Arixtra>>Coumadin Indication: PE/DVT (HIT positive)  Allergies  Allergen Reactions  . Norvasc [Amlodipine Besylate] Swelling       . Pletal [Cilostazol] Diarrhea  . Heparin Other (See Comments)    HIT positive as of 12/31/14    Patient Measurements: Height: 5\' 11"  (180.3 cm) Weight: 185 lb 6.5 oz (84.1 kg) IBW/kg (Calculated) : 75.3   Vital Signs: Temp: 100 F (37.8 C) (08/12 0518) Temp Source: Oral (08/12 0518) BP: 105/62 mmHg (08/12 1011) Pulse Rate: 109 (08/12 1011)  Labs:  Recent Labs  01/01/15 0610 01/02/15 0449 01/02/15 1554 01/03/15 0432  HGB 9.7* 9.9*  --  9.1*  HCT 28.0* 29.0*  --  27.2*  PLT 73* 99*  --  117*  LABPROT  --   --  22.8* 21.1*  INR  --   --  2.03* 1.83*  CREATININE  --  1.33*  --  1.47*    Estimated Creatinine Clearance: 52.6 mL/min (by C-G formula based on Cr of 1.47).  Assessment: 64 y/o M s/p CABG 3 wks ago presents s/p fall last PM and with B leg swelling/pain. Doppler + for R DVT.  Anticoag: Plt 64>73>99, HIT antibody +, SRA +. CTA shows PE. Now on arixtra.  8/12: Plts up to 117. Baseline INR 2.03 but currently trending down to 1.8. (Last dose of Xarelto 8/10 likely causing elevation of INR).  Would recommend keeping patient on arixtra at least a couple more days to get an idea of true INR on coumadin before stopping parenteral anticoagulation. Technically he has received the 5 days of recommended parenteral therapy would like to see INR > 2 after several days of warfarin therapy.  Goal of Therapy:  INR 2-3 Monitor platelets by anticoagulation protocol: Yes   Plan:  - Arixtra 7.5 mg daily (Continue until INR therapeutic x 24hrs) - Repeat Warfarin 7.5 mg x 1 tonight - Monitor CBC, s/sx of bleeding, and renal function closely - Daily PT/INR  Erin Hearing PharmD., BCPS Clinical Pharmacist Pager [phone] 01/03/2015 11:36 AM

## 2015-01-03 NOTE — Progress Notes (Signed)
Patient's wife states that pt is not himself today. He answers questions appropriately, but seems a little confused. He denies pain or discomfort. He currently is having chills. T 98.8. Other VS stable. Will monitor closely.

## 2015-01-03 NOTE — Progress Notes (Signed)
CARDIAC REHAB PHASE I   PRE:  Rate/Rhythm: 86 SR  BP:  Supine:   Sitting: 99/66  Standing:    SaO2: 100%RA  MODE:  Ambulation: 20 ft   POST:  Rate/Rhythm: 92 SR  BP:  Supine:   Sitting: 138/66  Standing:    SaO2: 99%RA 1049-1122 Pt did not tolerate walk well this morning. Yesterday pt did not need to sit and walked with good steady gait. Today he had to sit at door and again at 20 ft. Moved feet slowly and stated he was tired when asked if anything wrong. We rolled pt back to room since he did not feel that he could walk back. Denied SOB, just tired. Wife in room. To recliner with call bell. Wife stated she thought he was a little more confused today. Pt's RN aware of ex tolerance.   Graylon Good, RN BSN  01/03/2015 11:19 AM

## 2015-01-03 NOTE — Consult Note (Signed)
   [facility] CM Inpatient Consult   01/03/2015  [name] 09-Jun-1950 [id number] Referral received for community care management post hospital.  Met with the patient and his wife at the bedside to explain the [place] Management benefits that the patient is eligible for.  Wife states that they have [place] and the previous plan was for them to follow him at home for a month and then to go to Outpatient cardiac rehab after that.  She states that the patient rarely sees his primary care provider since most of his care and cardiac now. Explained to her that the primary care provider is key to assisting with his medical management.  She states that she would look over the information and call back if they are interested.  A folder with [place] Management information with consent form was given.  Wife states he may go home over this weekend 'if' his labs and medications are working.  Encouraged the wife to review information and call the contact information as provided.  This Probation officer did not receive consent for [place] Management services at this time. For questions, please contact: Natividad Brood, RN BSN [facility] Liaison  [phone] business mobile phone

## 2015-01-04 LAB — BASIC METABOLIC PANEL
ANION GAP: 7 (ref 5–15)
BUN: 17 mg/dL (ref 6–20)
CALCIUM: 8.3 mg/dL — AB (ref 8.9–10.3)
CO2: 20 mmol/L — AB (ref 22–32)
Chloride: 105 mmol/L (ref 101–111)
Creatinine, Ser: 1.25 mg/dL — ABNORMAL HIGH (ref 0.61–1.24)
GFR calc non Af Amer: 58 mL/min — ABNORMAL LOW (ref >60)
GLUCOSE: 99 mg/dL (ref 65–99)
POTASSIUM: 4.6 mmol/L (ref 3.5–5.1)
SODIUM: 132 mmol/L — AB (ref 135–145)

## 2015-01-04 LAB — CBC
HCT: 27.2 % — ABNORMAL LOW (ref 39.0–52.0)
Hemoglobin: 8.9 g/dL — ABNORMAL LOW (ref 13.0–17.0)
MCH: 31.7 pg (ref 26.0–34.0)
MCHC: 32.7 g/dL (ref 30.0–36.0)
MCV: 96.8 fL (ref 78.0–100.0)
Platelets: 129 10*3/uL — ABNORMAL LOW (ref 150–400)
RBC: 2.81 MIL/uL — ABNORMAL LOW (ref 4.22–5.81)
RDW: 15 % (ref 11.5–15.5)
WBC: 9.9 10*3/uL (ref 4.0–10.5)

## 2015-01-04 LAB — PROTIME-INR
INR: 2.14 — ABNORMAL HIGH (ref 0.00–1.49)
PROTHROMBIN TIME: 23.8 s — AB (ref 11.6–15.2)

## 2015-01-04 MED ORDER — WARFARIN SODIUM 5 MG PO TABS
5.0000 mg | ORAL_TABLET | Freq: Once | ORAL | Status: AC
Start: 2015-01-04 — End: 2015-01-04
  Administered 2015-01-04: 5 mg via ORAL
  Filled 2015-01-04: qty 1

## 2015-01-04 NOTE — Progress Notes (Addendum)
[id number]       [place] [address]             [phone]          Subjective: Feels ok, no new complaints. Not significantly SOB  Objective: Vital signs in last 24 hours: Temp:  [97.5 F (36.4 C)-99.3 F (37.4 C)] 98.7 F (37.1 C) (08/13 0500) Pulse Rate:  [88-109] 103 (08/13 0500) Cardiac Rhythm:  [-] Normal sinus rhythm (08/12 2030) Resp:  [18] 18 (08/13 0500) BP: (105-151)/(54-86) 137/81 mmHg (08/13 0500) SpO2:  [96 %-99 %] 96 % (08/13 0500) Weight:  [186 lb 6.4 oz (84.55 kg)] 186 lb 6.4 oz (84.55 kg) (08/13 0500)  Hemodynamic parameters for last 24 hours:    Intake/Output from previous day: 08/12 0701 - 08/13 0700 In: 512.5 [I.V.:512.5] Out: 800 [Urine:800] Intake/Output this shift:    General appearance: alert, cooperative and no distress Heart: regular rate and rhythm and tachy Lungs: minor right basilar crackles Abdomen: soft, nontender Extremities: + RLE edema Wound: incis healing well without signs of infection  Lab Results:  Recent Labs  01/02/15 0449 01/03/15 0432  WBC 13.0* 10.4  HGB 9.9* 9.1*  HCT 29.0* 27.2*  PLT 99* 117*   BMET:  Recent Labs  01/03/15 0432 01/04/15 0414  NA 134* 132*  K 4.8 4.6  CL 101 105  CO2 25 20*  GLUCOSE 96 99  BUN 17 17  CREATININE 1.47* 1.25*  CALCIUM 8.7* 8.3*    PT/INR:  Recent Labs  01/04/15 0414  LABPROT 23.8*  INR 2.14*   ABG    Component Value Date/Time   PHART 7.410 12/13/2014 1816   HCO3 21.9 12/13/2014 1816   TCO2 23 12/14/2014 1638   ACIDBASEDEF 2.0 12/13/2014 1816   O2SAT 94.0 12/13/2014 1816   CBG (last 3)  No results for input(s): GLUCAP in the last 72 hours.  Meds Scheduled Meds: . docusate sodium  100 mg Oral BID  . famotidine  20 mg Oral BID  . fondaparinux (ARIXTRA) injection  7.5 mg Subcutaneous Q0600  . metoprolol tartrate  50 mg Oral BID  . potassium chloride  30 mEq Oral BID  . rosuvastatin  20 mg Oral QHS  . sodium chloride  3 mL  Intravenous Q12H  . Warfarin - Pharmacist Dosing Inpatient   Does not apply q1800  . zolpidem  5 mg Oral Once   Continuous Infusions: . sodium chloride 75 mL/hr at 01/03/15 1220   PRN Meds:.acetaminophen **OR** acetaminophen, alum & mag hydroxide-simeth, morphine injection, ondansetron **OR** ondansetron (ZOFRAN) IV, oxyCODONE  Xrays Dg Chest 2 View  01/02/2015   CLINICAL DATA:  Pleural effusion  EXAM: CHEST - 2 VIEW  COMPARISON:  01/01/2015, 12/27/2014  FINDINGS: Cardiac shadow is mildly enlarged in size. Postsurgical changes are seen. The lungs are hyperinflated consistent with COPD. Small bilateral pleural effusions are noted similar to that seen on recent CT examination.  IMPRESSION: Small pleural effusions.  Changes of COPD.   Electronically Signed   By: Inez Catalina M.D.   On: 01/02/2015 08:38    Assessment/Plan:  1 conts current plans as outlined per medical service for PE/DVT. He appears to be improving clinically- no changes from CT surgery viewpoint at this time    LOS: 8 days    GOLD,WAYNE E 01/04/2015  I have seen and examined [name] and agree with the above assessment  and plan.  Grace Isaac MD Beeper [phone]  Office [phone] 01/04/2015 10:27 AM

## 2015-01-04 NOTE — Progress Notes (Signed)
CARDIAC REHAB PHASE I   PRE:  Rate/Rhythm: 92 SR  BP:  Sitting: 121/62        SaO2: 99 RA  MODE:  Ambulation: 150 ft   POST:  Rate/Rhythm: 92 SR  BP:  Sitting: 116/61         SaO2: 100 RA  Pt lying in bed, his back wet from sweat. Pt states he is tired and wants to take a nap but agreeable to walk. Pt ambulated 150 ft on RA, rollator, gait belt, assist x2, tolerated fair. Pt denies CP, dizziness, DOE, did c/o fatigue. Pt still needs to be reminded of sternal precautions, unsteady with rollator, needs cues to maintain good posture and stay close to walker. Pt remains agreeable to phase 2, states he has no questions regarding previous education. Pt to bed after walk, call bell within reach, wife at bedside.   BX:[id number]  [name], RN, BSN 01/04/2015 2:14 PM

## 2015-01-04 NOTE — Progress Notes (Signed)
Pt ambulated with front wheel walker in hall 100 feet, taking several standing rest breaks. He tolerated the walk well. Assisted to chair with feet elevated. Call bell in reach.

## 2015-01-04 NOTE — Progress Notes (Signed)
[facility] PROGRESS NOTE  [name] [id number] DOB: 1950-09-27 DOA: 12/27/2014 PCP: Donnie Coffin, MD   Narrative: [phone] s/p recent CABG presents with new LE DVT. Initially  On heparin gtt transitioned to xarelto. Pt developed thrombocytopenia. Hit Ab screen positive Now with ACute PE, on Arixtra, also low grade temps  Assessment/Plan:  Acute RLE DVT and PE -Appreciate CVTS input. -was on IV Heparin, this was transitioned to Xarelto on 8/8, due to drop in platelets -HIT panel positive, changed to Arixtra 8/10 -appreciate Heme Dr.Shadads consult -started warfarin 8/11, INR therapeutic -stable  Low grade fever -could be related to acute PE  Vs pulm infarcts -monitor, WBC improved and normal  Anemia-post op blood loss anemia - Worsened postoperatively - hemoccult negative - Transfused 2 units PRBC with good response, stable  Thrombocytopenia - Heparin induced clinically and HIt Ab panel positive - stopped xarelto, started Arixtra 8/10 - FU Serotonin release assay  - appreciate consult per Dr.Shadad - plts improving   Acute renal failure/dehydration with hyponatremia -Seems related to recent addition of Lasix -Holding Lasix, improved with hydration, slightly worsened again, given IVF overnight stop IVF today   Essential hypertension, benign -Current blood pressure controlled -ACE inhibitor held in setting of acute renal failure -Continue beta blocker   CAD/recent NSTEMI/S/P CABG x 5 (12/13/14) - Dr. Cyndia Bent following - Continue beta blocker, crestor   PAD  -Stable    AAA  -Very complex and due to degree of calcification has been deemed not electively operable -Follow[facility] Sam[facility]el [facility] at [facility]  Constipation - Good improvement with laxatives  Code Status: Full Family Communication: wife at bedside Disposition Plan: home when stable   Consultants:  CT Surgery  Procedures:    Antibiotics:    HPI/Subjective: Feels and  breathing better today  Objective: Filed Vitals:   01/03/15 1400 01/03/15 2010 01/04/15 0500 01/04/15 1020  BP: 110/54 125/58 137/81 163/73  Pulse: 92 88 103 99  Temp: 97.5 F (36.4 C) 98 F (36.7 C) 98.7 F (37.1 C)   TempSrc: Oral Oral Oral   Resp:  18 18   Height:      Weight:   84.55 kg (186 lb 6.4 oz)   SpO2: 97% 99% 96%     Intake/Output Summary (Last 24 hours) at 01/04/15 1323 Last data filed at 01/04/15 1200  Gross per 24 hour  Intake  752.5 ml  Output    825 ml  Net  -72.5 ml   Filed Weights   01/02/15 0510 01/03/15 0518 01/04/15 0500  Weight: 84.55 kg (186 lb 6.4 oz) 84.1 kg (185 lb 6.5 oz) 84.55 kg (186 lb 6.4 oz)    Exam:   General:  AAOx3, no distress  Cardiovascular: regular, s1, s2  Respiratory: normal resp effort, poor air movement b/l  Abdomen: soft,nondistended, pos BS  Musculoskeletal: RLE 1plus edema, improved  Data Reviewed: Basic Metabolic Panel:  Recent Labs Lab 12/30/14 0350 12/31/14 0337 01/02/15 0449 01/03/15 0432 01/04/15 0414  NA 132* 131* 133* 134* 132*  K 3.3* 3.3* 4.2 4.8 4.6  CL 98* 99* 99* 101 105  CO2 22 24 23 25  20*  GLUCOSE 99 101* 98 96 99  BUN 9 8 13 17 17   CREATININE 1.15 1.21 1.33* 1.47* 1.25*  CALCIUM 8.2* 8.5* 8.8* 8.7* 8.3*  MG  --  1.1*  --   --   --    Liver Function Tests: No results for input(s): AST, ALT, ALKPHOS, BILITOT, PROT, ALBUMIN in the  last 168 hours. No results for input(s): LIPASE, AMYLASE in the last 168 hours. No results for input(s): AMMONIA in the last 168 hours. CBC:  Recent Labs Lab 12/30/14 0350 12/31/14 0337 01/01/15 0610 01/02/15 0449 01/03/15 0432  WBC 6.5 6.6 12.5* 13.0* 10.4  HGB 9.7* 10.2* 9.7* 9.9* 9.1*  HCT 28.4* 29.9* 28.0* 29.0* 27.2*  MCV 92.8 92.0 93.3 95.1 94.4  PLT 84* 64* 73* 99* 117*   Cardiac Enzymes: No results for input(s): CKTOTAL, CKMB, CKMBINDEX, TROPONINI in the last 168 hours. BNP (last 3 results)  Recent Labs  12/27/14 1028  BNP 568.1*     ProBNP (last 3 results)  Recent Labs  11/08/14 1544  PROBNP 179.0*    CBG: No results for input(s): GLUCAP in the last 168 hours.  No results found for this or any previous visit (from the past 240 hour(s)).   Studies: No results found.  Scheduled Meds: . docusate sodium  100 mg Oral BID  . famotidine  20 mg Oral BID  . fondaparinux (ARIXTRA) injection  7.5 mg Subcutaneous Q0600  . metoprolol tartrate  50 mg Oral BID  . potassium chloride  30 mEq Oral BID  . rosuvastatin  20 mg Oral QHS  . sodium chloride  3 mL Intravenous Q12H  . warfarin  5 mg Oral ONCE-1800  . Warfarin - Pharmacist Dosing Inpatient   Does not apply q1800  . zolpidem  5 mg Oral Once   Continuous Infusions: . sodium chloride 75 mL/hr at 01/03/15 1220    Principal Problem:   Acute DVT (deep venous thrombosis) Active Problems:   Essential hypertension, benign   Hyperlipidemia with target LDL less than 70   PAD (peripheral artery disease)   AAA (abdominal aortic aneurysm)   CAD (coronary artery disease)   S/P CABG x 5 12/13/14   Anemia   Acute renal failure   AKI (acute kidney injury)   Absolute anemia   Acute pulmonary embolism    [facility] Pager [phone]. If 7PM-7AM, please contact night-coverage at [email or web address], password [facility] 01/04/2015, 1:23 PM  LOS: 8 days

## 2015-01-04 NOTE — Progress Notes (Signed)
ANTICOAGULATION CONSULT NOTE - Initial Consult  Pharmacy Consult for Arixtra>>Coumadin Indication: PE/DVT (HIT positive)  Allergies  Allergen Reactions  . Norvasc [Amlodipine Besylate] Swelling       . Pletal [Cilostazol] Diarrhea  . Heparin Other (See Comments)    HIT positive as of 12/31/14    Patient Measurements: Height: 5\' 11"  (180.3 cm) Weight: 186 lb 6.4 oz (84.55 kg) IBW/kg (Calculated) : 75.3   Vital Signs: Temp: 98.7 F (37.1 C) (08/13 0500) Temp Source: Oral (08/13 0500) BP: 163/73 mmHg (08/13 1020) Pulse Rate: 99 (08/13 1020)  Labs:  Recent Labs  01/02/15 0449 01/02/15 1554 01/03/15 0432 01/04/15 0414  HGB 9.9*  --  9.1*  --   HCT 29.0*  --  27.2*  --   PLT 99*  --  117*  --   LABPROT  --  22.8* 21.1* 23.8*  INR  --  2.03* 1.83* 2.14*  CREATININE 1.33*  --  1.47* 1.25*    Estimated Creatinine Clearance: 61.9 mL/min (by C-G formula based on Cr of 1.25).  Assessment: 64 y/o M s/p CABG 3 wks ago presents s/p fall last PM and with B leg swelling/pain. Doppler + for R DVT.  Anticoag: Plt 64>73>99, HIT antibody +, SRA +. CTA shows PE. Now on arixtra.  8/12: Plts up to 117. Baseline INR 2.03 but currently trending down to 1.8. (Last dose of Xarelto 8/10 likely causing elevation of INR).  INR 2.14 s/p 2 doses of 7.5 mg warfarin  Goal of Therapy:  INR 2-3 Monitor platelets by anticoagulation protocol: Yes   Plan:  - Arixtra 7.5 mg daily (Continue until INR therapeutic x 24hrs - d/c Sunday?) - Warfarin 5 mg x 1 tonight - Monitor CBC, s/sx of bleeding, and renal function closely - Daily PT/INR  Levester Fresh, PharmD, BCPS Clinical Pharmacist Pager [phone] 01/04/2015 12:23 PM

## 2015-01-05 LAB — BASIC METABOLIC PANEL
ANION GAP: 11 (ref 5–15)
BUN: 15 mg/dL (ref 6–20)
CHLORIDE: 103 mmol/L (ref 101–111)
CO2: 19 mmol/L — ABNORMAL LOW (ref 22–32)
CREATININE: 1.08 mg/dL (ref 0.61–1.24)
Calcium: 8.9 mg/dL (ref 8.9–10.3)
GFR calc Af Amer: >60 mL/min (ref >60)
Glucose, Bld: 107 mg/dL — ABNORMAL HIGH (ref 65–99)
POTASSIUM: 4.1 mmol/L (ref 3.5–5.1)
Sodium: 133 mmol/L — ABNORMAL LOW (ref 135–145)

## 2015-01-05 LAB — CBC
HCT: 30.6 % — ABNORMAL LOW (ref 39.0–52.0)
HEMOGLOBIN: 10 g/dL — AB (ref 13.0–17.0)
MCH: 31.2 pg (ref 26.0–34.0)
MCHC: 32.7 g/dL (ref 30.0–36.0)
MCV: 95.3 fL (ref 78.0–100.0)
Platelets: 155 10*3/uL (ref 150–400)
RBC: 3.21 MIL/uL — ABNORMAL LOW (ref 4.22–5.81)
RDW: 15 % (ref 11.5–15.5)
WBC: 11.1 10*3/uL — ABNORMAL HIGH (ref 4.0–10.5)

## 2015-01-05 LAB — PROTIME-INR
INR: 2.15 — AB (ref 0.00–1.49)
PROTHROMBIN TIME: 23.8 s — AB (ref 11.6–15.2)

## 2015-01-05 MED ORDER — WARFARIN SODIUM 5 MG PO TABS
5.0000 mg | ORAL_TABLET | Freq: Once | ORAL | Status: DC
  Filled 2015-01-05: qty 1

## 2015-01-05 MED ORDER — WARFARIN SODIUM 5 MG PO TABS: 5.0000 mg | ORAL_TABLET | Freq: Every day | ORAL | Status: DC

## 2015-01-05 MED ORDER — METOPROLOL TARTRATE 50 MG PO TABS: 50.0000 mg | ORAL_TABLET | Freq: Two times a day (BID) | ORAL | Status: DC

## 2015-01-05 NOTE — Care Management Important Message (Signed)
Important Message  Patient Details  Name: [name] MRN: [id number] Date of Birth: 07-Mar-1951   Medicare Important Message Given:  Yes, additional notification given     Guido Sander, RN 01/05/2015, 11:36 AM

## 2015-01-05 NOTE — Progress Notes (Signed)
ANTICOAGULATION CONSULT NOTE - Initial Consult  Pharmacy Consult for Arixtra>>Coumadin Indication: PE/DVT (HIT positive)  Allergies  Allergen Reactions  . Norvasc [Amlodipine Besylate] Swelling       . Pletal [Cilostazol] Diarrhea  . Heparin Other (See Comments)    HIT positive as of 12/31/14    Patient Measurements: Height: 5\' 11"  (180.3 cm) Weight: 184 lb 8.4 oz (83.7 kg) IBW/kg (Calculated) : 75.3   Vital Signs: Temp: 98.4 F (36.9 C) (08/14 0900) Temp Source: Oral (08/14 0900) BP: 155/78 mmHg (08/14 0318) Pulse Rate: 92 (08/14 0318)  Labs:  Recent Labs  01/03/15 0432 01/04/15 0414 01/05/15 0438  HGB 9.1* 8.9* 10.0*  HCT 27.2* 27.2* 30.6*  PLT 117* 129* 155  LABPROT 21.1* 23.8* 23.8*  INR 1.83* 2.14* 2.15*  CREATININE 1.47* 1.25* 1.08    Estimated Creatinine Clearance: 71.7 mL/min (by C-G formula based on Cr of 1.08).  Assessment: 64 y/o M s/p CABG 3 wks ago presents s/p fall last PM and with B leg swelling/pain. Doppler + for R DVT.  Anticoag: Plt 64>73>99, HIT antibody +, SRA +. CTA shows PE. Now on arixtra.  8/12: Plts up to 117. Baseline INR 2.03 but currently trending down to 1.8. (Last dose of Xarelto 8/10 likely causing elevation of INR).  INR 2.15 s/p 7.5mg  x 2, 5 mg x 1 warfarin  Goal of Therapy:  INR 2-3 Monitor platelets by anticoagulation protocol: Yes   Plan:  - D/C Arixtra 7.5 mg (will page Dr Broadus John) - Warfarin 5 mg x 1 tonight - Monitor CBC, s/sx of bleeding, and renal function closely - Daily PT/INR  Levester Fresh, PharmD, BCPS Clinical Pharmacist Pager [phone] 01/05/2015 10:20 AM

## 2015-01-05 NOTE — Discharge Summary (Signed)
Physician Discharge Summary  [name] [id number] DOB: 05/26/1950 DOA: 12/27/2014  PCP: Donnie Coffin, MD  Admit date: 12/27/2014 Discharge date: 01/05/2015  Time spent: 45 minutes  Recommendations for Outpatient Follow-up:  1. Truitt Merle NP with [facility] Heart care in 3-4days, please check INR at FU 2. Dr.Shadad Hematology in 1 month  Discharge Diagnoses:  Principal Problem:   Acute DVT (deep venous thrombosis)   Acute pulmonary embolism   S/P CABG x 5 12/13/14   Heparin induced thrombocytopenia, SRA positive   Essential hypertension, benign   Hyperlipidemia with target LDL less than 70   PAD (peripheral artery disease)   AAA (abdominal aortic aneurysm)   CAD (coronary artery disease)   Anemia   Acute renal failure   AKI (acute kidney injury)   Absolute anemia   Discharge Condition: stable  Diet recommendation: low sodium, heart ehalthy  Filed Weights   01/03/15 0518 01/04/15 0500 01/05/15 0318  Weight: 84.1 kg (185 lb 6.5 oz) 84.55 kg (186 lb 6.4 oz) 83.7 kg (184 lb 8.4 oz)    History of present illness:  Chief complaint: pain and swelling of R leg This is a 64 year old male patient recent non-STEMI and subsequent CABG 5 with vein harvesting from the right lower extremity on 12/13/14 who presented with progressive right lower extremity swelling and discomfort. Patient reported that initially post operative from the heart surgery he had an very well and was walking without difficulty but beginning on 8/1 he began developing bilateral lower extremity edema which eventually progressed to being markedly worse on the right lower extremity. He followed up with the cardiac surgery office on 8/2 and was started on Lasix and potassium for bilateral lower extremity edema. Unfortunately although the swelling decreased somewhat (especially on the left lower extremity with the use of Lasix) the patient continued with significant pain which influenced his mobility. Last night he fell at  home and he was on the floor for at least 2 hours before his wife found him. Because of these symptoms he presented to the emergency department.  Hospital Course:  Acute RLE DVT and PE -Appreciate CVTS input. -was on IV Heparin, this was transitioned to Xarelto on 8/8, due to drop in platelets -HIT panel positive, changed to Arixtra 8/10 -seen by hematology Dr.Shadad in consultation -started warfarin 8/11, INR now therapeutic, was bridged with Arixtra -stable, INR therapeutic at discharge continue warfarin for atleast 6 months, FU with Hematology Dr.shadad  Low grade fever -could be related to acute PE Vs pulm infarcts -monitor, WBC improved and normal -resolved  Anemia-post op blood loss anemia - Worsened postoperatively - hemoccult negative - Transfused 2 units PRBC with good response, stable  Thrombocytopenia - Heparin induced clinically and HIt Ab panel positive - stopped xarelto, started Arixtra 8/10 - Serotonin release assay positive as well and hence HIT confirmed - heme consult per Dr.Shadad completed - plts improving and normal at discharge today   Acute renal failure/dehydration with hyponatremia -Seems related to recent addition of Lasix -Holding Lasix, improved with hydration   Essential hypertension, benign -Current blood pressure controlled -ACE inhibitor held in setting of acute renal failure -Continue beta blocker   CAD/recent NSTEMI/S/P CABG x 5 (12/13/14) - Dr. Cyndia Bent following - Continue beta blocker, crestor   PAD  -Stable    AAA  -Very complex and due to degree of calcification has been deemed not electively operable -Followed by Dr. Sammuel Hines at [facility]  Constipation - Good improvement with laxatives   Consultations:  Heme  CVTS  Discharge Exam: Filed Vitals:   01/05/15 0318  BP: 155/78  Pulse: 92  Temp: 97.6 F (36.4 C)  Resp: 20    General: AAOx3 Cardiovascular: S1S2/RRR Respiratory: CTAB  Discharge  Instructions   Discharge Instructions    Diet - low sodium heart healthy    Complete by:  As directed      Increase activity slowly    Complete by:  As directed           Current Discharge Medication List    START taking these medications   Details  docusate sodium (COLACE) 100 MG capsule Take 1 capsule (100 mg total) by mouth 2 (two) times daily. Qty: 10 capsule, Refills: 0    warfarin (COUMADIN) 5 MG tablet Take 1 tablet (5 mg total) by mouth daily. Take 5mg  Daily, INR check in 3-4days and warfarin dose to be adjusted based on that Qty: 15 tablet, Refills: 1      CONTINUE these medications which have CHANGED   Details  metoprolol (LOPRESSOR) 50 MG tablet Take 1 tablet (50 mg total) by mouth 2 (two) times daily. Qty: 60 tablet, Refills: 0      CONTINUE these medications which have NOT CHANGED   Details  acetaminophen (TYLENOL) 325 MG tablet Take 650 mg by mouth every 6 (six) hours as needed for mild pain or headache.    aspirin 81 MG tablet Take 81 mg by mouth every evening.     clopidogrel (PLAVIX) 75 MG tablet take 1 tablet by mouth once daily Qty: 90 tablet, Refills: 0    CRESTOR 20 MG tablet take 1 tablet by mouth once daily at bedtime Qty: 30 tablet, Refills: 5    lisinopril (PRINIVIL,ZESTRIL) 5 MG tablet Take 1 tablet (5 mg total) by mouth daily. Qty: 30 tablet, Refills: 1    NITROSTAT 0.4 MG SL tablet Place 0.4 mg under the tongue every 5 (five) minutes as needed. Chest pain Refills: 0    oxyCODONE (OXY IR/ROXICODONE) 5 MG immediate release tablet Take 1-2 tablets (5-10 mg total) by mouth every 4 (four) hours as needed for severe pain. Qty: 30 tablet, Refills: 0    ranitidine (ZANTAC) 300 MG tablet take 1 tablet by mouth at bedtime Qty: 30 tablet, Refills: 6    Thiamine HCl (VITAMIN B-1) 100 MG tablet Take 100 mg by mouth daily.      triamcinolone cream (KENALOG) 0.5 % Apply 1 application topically as needed (for itching).      STOP taking these  medications     furosemide (LASIX) 20 MG tablet      potassium chloride (K-DUR,KLOR-CON) 10 MEQ tablet        Allergies  Allergen Reactions  . Norvasc [Amlodipine Besylate] Swelling       . Pletal [Cilostazol] Diarrhea  . Heparin Other (See Comments)    HIT positive as of 12/31/14   Follow-up Information    Follow up with Donnie Coffin, MD. Schedule an appointment as soon as possible for a visit in 1 week.   Specialty:  Family Medicine   Why:  Hospital follow up   Contact information:   [address] [place] [address] [phone]       Follow up with Gaye Pollack, MD. Schedule an appointment as soon as possible for a visit in 1 week.   Specialty:  Cardiothoracic Surgery   Why:  PA/LAT CXR to be taken (at [facility] which is in the  same building as Dr. Vivi Martens office) on 01/22/2015 at 9:15 am;Appointment time is at 10:00 am   Contact information:   [place] [address] [phone]       Follow up with Truitt Merle, NP In 3 days.   Specialties:  Nurse Practitioner, Interventional Cardiology, Cardiology, Radiology   Why:  please check INR at FU   Contact information:   [place] [place] [address] [phone]        The results of significant diagnostics from this hospitalization (including imaging, microbiology, ancillary and laboratory) are listed below for reference.    Significant Diagnostic Studies: Dg Chest 2 View  01/02/2015   CLINICAL DATA:  Pleural effusion  EXAM: CHEST - 2 VIEW  COMPARISON:  01/01/2015, 12/27/2014  FINDINGS: Cardiac shadow is mildly enlarged in size. Postsurgical changes are seen. The lungs are hyperinflated consistent with COPD. Small bilateral pleural effusions are noted similar to that seen on recent CT examination.  IMPRESSION: Small pleural effusions.  Changes of COPD.   Electronically Signed   By: Inez Catalina M.D.   On: 01/02/2015 08:38   Dg Chest 2  View  12/27/2014   CLINICAL DATA:  Pain following fall 1 day prior  EXAM: CHEST  2 VIEW  COMPARISON:  December 15, 2014  FINDINGS: There is no edema or consolidation. Heart size and pulmonary vascularity within normal limits. Patient is status post coronary artery bypass grafting. No adenopathy. No pneumothorax. No acute fracture. There are foci of arterial vascular calcification.  IMPRESSION: No edema or consolidation. No change in cardiac silhouette. No pneumothorax apparent.   Electronically Signed   By: Lowella Grip III M.D.   On: 12/27/2014 11:02   Dg Chest 2 View  12/07/2014   CLINICAL DATA:  Acute on chronic central chest tightness. History of hypertension, AAA, CAD, COPD, stroke, GERD.  EXAM: CHEST  2 VIEW  COMPARISON:  05/29/2012  FINDINGS: Heart size is normal. There is mild perihilar bronchitic change. Biapical pleural parenchymal changes are stable. There are no focal consolidations or pleural effusions. No pulmonary edema. Visualized osseous structures have a normal appearance.  IMPRESSION: 1. Bronchitic changes. 2.  No focal acute pulmonary abnormality.   Electronically Signed   By: Nolon Nations M.D.   On: 12/07/2014 11:17   Ct Angio Chest Pe W/cm &/or Wo Cm  01/01/2015   CLINICAL DATA:  Recent CABG procedure. Progressive right lower extremity swelling and discomfort. Right lower extremity DVT. Patient has dyspnea.  EXAM: CT ANGIOGRAPHY CHEST WITH CONTRAST  TECHNIQUE: Multidetector CT imaging of the chest was performed using the standard protocol during bolus administration of intravenous contrast. Multiplanar CT image reconstructions and MIPs were obtained to evaluate the vascular anatomy.  CONTRAST:  30mL OMNIPAQUE IOHEXOL 350 MG/ML SOLN  COMPARISON:  Chest radiograph 12/27/2014 and chest CT 04/14/2012  FINDINGS: Study is positive for pulmonary embolism. There is clot in the distal right pulmonary artery with a large amount of clot extending into the right lower lobar arteries. Small  amount of clot in the proximal right middle lobe pulmonary artery. Small amount of clot extending into the proximal right upper lobe pulmonary arteries. Normal caliber of the main pulmonary artery measuring 2.8 cm. There may be a small amount of clot involving a left lower lobe subsegmental branch. No significant enlargement of the right ventricle and no evidence for interventricular septal deviation.  Postsurgical changes compatible with a CABG procedure. There are trace bilateral pleural  effusions, left side greater than right. There is small amount of pericardial fluid.  Images of the upper abdomen again demonstrate a small lymph node with calcifications in the porta hepatis. No acute abnormality in the upper abdomen. There is no significant chest lymphadenopathy. Atherosclerotic calcifications involving the thoracic aorta and great vessels. The right brachiocephalic artery is heavily calcified.  The trachea and mainstem bronchi are patent. Mild emphysematous changes in the upper lobes. There are patchy peripheral densities in the right lower lobe which are concerning for an infarct. Alternatively, this could be related to atelectasis. There is no significant airspace disease or consolidation in the left lung. Few peripheral densities in the left upper lobe are nonspecific.  No acute bone abnormality.  Old right rib fractures.  Review of the MIP images confirms the above findings.  IMPRESSION: Positive for pulmonary emboli. The clot burden is predominantly in the right pulmonary arteries, largest in the right lower lobe. Peripheral densities in the right lower lobe are concerning for an infarct.  Trace bilateral pleural effusions.  Postsurgical changes in the chest.  Critical Value/emergent results were called by telephone at the time of interpretation on 01/01/2015 at 1:45 pm to Dr. Domenic Polite , who verbally acknowledged these results.   Electronically Signed   By: Markus Daft M.D.   On: 01/01/2015 14:00    Dg Chest Port 1 View  12/15/2014   CLINICAL DATA:  64 year old male post CABG 12/13/2014. Subsequent exam.  EXAM: PORTABLE CHEST - 1 VIEW  COMPARISON:  12/14/2014.  FINDINGS: Swan-Ganz catheter removed. Introducer remains in place projecting at the level of the proximal right internal jugular vein.  Left-sided chest tube and mediastinal drain have been removed. No gross pneumothorax.  Mediastinal drain removed. Post CABG. Cardiomegaly. Calcified minimally tortuous aorta.  Consolidation right lung base may represent elevated right hemidiaphragm although limited for excluding right base atelectasis.  IMPRESSION: Swan-Ganz catheter and left-sided chest tube vessels mediastinal drain removed without evidence gross pneumothorax.  Consolidation right lung base may represent elevated right hemidiaphragm although limited for excluding right base atelectasis.  Post CABG with cardiomegaly.   Electronically Signed   By: Genia Del M.D.   On: 12/15/2014 09:37   Dg Chest Port 1 View  12/14/2014   CLINICAL DATA:  Recent coronary bypass grafting  EXAM: PORTABLE CHEST - 1 VIEW  COMPARISON:  12/13/2014  FINDINGS: Swan-Ganz catheter, mediastinal drain and left thoracostomy catheter are again noted. There is been interval removal of the nasogastric catheter and endotracheal tube. The lungs are well aerated bilaterally with minimal basilar atelectasis. No focal confluent infiltrate is seen. Overall improved aeration in the left base is noted from the prior exam.  IMPRESSION: Bibasilar atelectasis although overall improved aeration particularly in the left base.  Tubes and lines as described.   Electronically Signed   By: Inez Catalina M.D.   On: 12/14/2014 07:29   Dg Chest Port 1 View  12/13/2014   CLINICAL DATA:  Postop CABG x5. History of hypertension, coronary artery disease, AAA  EXAM: PORTABLE CHEST - 1 VIEW  COMPARISON:  Chest x-ray dated 12/07/2014.  FINDINGS: Status post interval median sternotomy. Endotracheal  tube appears adequately positioned with tip at the level of the clavicles. Swan-Ganz catheter in place with tip just to the left of midline. Left-sided chest tube in place with tip directed towards the left lung apex. I believe there is a second left-sided chest tube at the left lung base.  Distal portion of the nasogastric tube  is not clearly seen separate from the overlying tubes and lines but I believe the tip of the nasogastric tube is just below the gastroesophageal junction and that the proximal side holes are within the lower esophagus.  There is a probable small left pleural effusion with adjacent atelectasis. There is mild bilateral interstitial edema that may be accentuated by low lung volumes. No pneumothorax seen.  IMPRESSION: 1. Distal tip of the nasogastric tube is not clearly seen. I suspect that it is in the upper stomach and that the proximal side holes of the nasogastric tube are within the distal esophagus. Would consider advancing for optimal radiographic appearance. 2. Left-sided chest tube with tip directed towards the left lung apex. A possible second left-sided chest tube at the left lung base? 3. Probable small left pleural effusion with adjacent atelectasis. 4. Mild bilateral interstitial edema likely accentuated by low lung volumes.  These results will be called to the patient's nurse by the Radiologist Assistant, and communication documented in the PACS or zVision Dashboard.   Electronically Signed   By: Franki Cabot M.D.   On: 12/13/2014 13:53    Microbiology: No results found for this or any previous visit (from the past 240 hour(s)).   Labs: Basic Metabolic Panel:  Recent Labs Lab 12/31/14 0337 01/02/15 0449 01/03/15 0432 01/04/15 0414 01/05/15 0438  NA 131* 133* 134* 132* 133*  K 3.3* 4.2 4.8 4.6 4.1  CL 99* 99* 101 105 103  CO2 24 23 25  20* 19*  GLUCOSE 101* 98 96 99 107*  BUN 8 13 17 17 15   CREATININE 1.21 1.33* 1.47* 1.25* 1.08  CALCIUM 8.5* 8.8* 8.7* 8.3*  8.9  MG 1.1*  --   --   --   --    Liver Function Tests: No results for input(s): AST, ALT, ALKPHOS, BILITOT, PROT, ALBUMIN in the last 168 hours. No results for input(s): LIPASE, AMYLASE in the last 168 hours. No results for input(s): AMMONIA in the last 168 hours. CBC:  Recent Labs Lab 01/01/15 0610 01/02/15 0449 01/03/15 0432 01/04/15 0414 01/05/15 0438  WBC 12.5* 13.0* 10.4 9.9 11.1*  HGB 9.7* 9.9* 9.1* 8.9* 10.0*  HCT 28.0* 29.0* 27.2* 27.2* 30.6*  MCV 93.3 95.1 94.4 96.8 95.3  PLT 73* 99* 117* 129* 155   Cardiac Enzymes: No results for input(s): CKTOTAL, CKMB, CKMBINDEX, TROPONINI in the last 168 hours. BNP: BNP (last 3 results)  Recent Labs  12/27/14 1028  BNP 568.1*    ProBNP (last 3 results)  Recent Labs  11/08/14 1544  PROBNP 179.0*    CBG: No results for input(s): GLUCAP in the last 168 hours.     SignedDomenic Polite  [facility] 01/05/2015, 8:56 AM

## 2015-01-05 NOTE — Progress Notes (Signed)
      [address]       [place] [address]             [phone]          Subjective: No new complaints, feeling well in general  Objective: Vital signs in last 24 hours: Temp:  [97.6 F (36.4 C)-99.7 F (37.6 C)] 97.6 F (36.4 C) (08/14 0318) Pulse Rate:  [90-99] 92 (08/14 0318) Cardiac Rhythm:  [-] Normal sinus rhythm (08/13 2009) Resp:  [16-20] 20 (08/14 0318) BP: (119-163)/(64-78) 155/78 mmHg (08/14 0318) SpO2:  [97 %-100 %] 100 % (08/14 0318) Weight:  [184 lb 8.4 oz (83.7 kg)] 184 lb 8.4 oz (83.7 kg) (08/14 0318)  Hemodynamic parameters for last 24 hours:    Intake/Output from previous day: 08/13 0701 - 08/14 0700 In: 600 [P.O.:600] Out: 475 [Urine:475] Intake/Output this shift:    General appearance: alert, cooperative and no distress Heart: regular rate and rhythm Lungs: clear to auscultation bilaterally Abdomen: benign Extremities: no edema Wound: incis healing well  Lab Results:  Recent Labs  01/04/15 0414 01/05/15 0438  WBC 9.9 11.1*  HGB 8.9* 10.0*  HCT 27.2* 30.6*  PLT 129* 155   BMET:  Recent Labs  01/04/15 0414 01/05/15 0438  NA 132* 133*  K 4.6 4.1  CL 105 103  CO2 20* 19*  GLUCOSE 99 107*  BUN 17 15  CREATININE 1.25* 1.08  CALCIUM 8.3* 8.9    PT/INR:  Recent Labs  01/05/15 0438  LABPROT 23.8*  INR 2.15*   ABG    Component Value Date/Time   PHART 7.410 12/13/2014 1816   HCO3 21.9 12/13/2014 1816   TCO2 23 12/14/2014 1638   ACIDBASEDEF 2.0 12/13/2014 1816   O2SAT 94.0 12/13/2014 1816   CBG (last 3)  No results for input(s): GLUCAP in the last 72 hours.  Meds Scheduled Meds: . docusate sodium  100 mg Oral BID  . famotidine  20 mg Oral BID  . fondaparinux (ARIXTRA) injection  7.5 mg Subcutaneous Q0600  . metoprolol tartrate  50 mg Oral BID  . potassium chloride  30 mEq Oral BID  . rosuvastatin  20 mg Oral QHS  . sodium chloride  3 mL Intravenous Q12H  . Warfarin - Pharmacist Dosing Inpatient    Does not apply q1800  . zolpidem  5 mg Oral Once   Continuous Infusions:  PRN Meds:.acetaminophen **OR** acetaminophen, alum & mag hydroxide-simeth, morphine injection, ondansetron **OR** ondansetron (ZOFRAN) IV, oxyCODONE  Xrays No results found.  Assessment/Plan:  1 conts to be quite stable in regards to PE/DVT- home when ok with primary service   LOS: 9 days    Griffith Santilli E 01/05/2015

## 2015-01-05 NOTE — Care Management Note (Addendum)
Case Management Note  Patient Details  Name: [name] MRN: [id number] Date of Birth: 1950/10/22  Subjective/Objective:                  s/p recent CABG presents with new LE DVT.   Action/Plan: Cm spoke to pt and spouse at the bedside. CM offered choice HH agency and spouse states that they prefer [facility] for PT/OT, and RN. CM called and spoke with [name] with [facility] and advised of pt choice. CM spoke to pt and spouse about DME needs and they state they have a rolling walker at home and don't feel that any additional DME is needed. Wife able to provide 24 hour supervision. Cm spoke to pt and spouse about Coumadin including foods to avoid and safety precautions due to the increased risk of bleeding. Pt is being discharged home on coumadin. Understanding verbalized. No further CM needs communicated and per wife, probable discharge home today.    Expected Discharge Date:  01/05/15              Expected Discharge Plan:  [place]  In-House Referral:     Discharge planning Services  CM consult  Post Acute Care Choice:  Home Health Choice offered to:  Patient, Spouse  DME Arranged:  N/A DME Agency:  NA  HH Arranged:  PT, RN [place] Agency:  [facility]  Status of Service:  In process, will continue to follow  Medicare Important Message Given:  Yes Date Medicare IM Given:    Medicare IM give by:    Date Additional Medicare IM Given:  01/05/15 Additional Medicare Important Message give by:  Marcy Panning, RN, BSN, CM  If discussed at [employer], dates discussed:    Additional Comments:  Guido Sander, RN 01/05/2015, 11:39 AM

## 2015-01-05 NOTE — Progress Notes (Signed)
Pt ambulated 150 feet with rolling walker. He complained of pain in right calf, otherwise he tolerated it well.

## 2015-01-06 ENCOUNTER — Telehealth: Payer: Self-pay | Admitting: Nurse Practitioner

## 2015-01-06 NOTE — Telephone Encounter (Signed)
New Message    Pt wife is requesting a call back to tell you about the pt being in the   Hospital and the information that was given to her.  She also wants to have him come in and start Couadin

## 2015-01-06 NOTE — Telephone Encounter (Signed)
Pt's wife per DPR called in because of hospital d/c papers. Stated pt is suppose to have a new coumadin check 3 days after discharge from hospital and see Tera Helper in one week.  Stated I will take care of all appointment this time but there is a department that schedules all theses appointments. Called pt's wife back pt is scheduled for new coumadin appointment on August 17 and will see Tera Helper on August 22.  Pt's wife agreeable to plan. Will FYI Truitt Merle, NP

## 2015-01-07 ENCOUNTER — Other Ambulatory Visit: Payer: Medicare Other

## 2015-01-07 DIAGNOSIS — I2510 Atherosclerotic heart disease of native coronary artery without angina pectoris: Secondary | ICD-10-CM | POA: Diagnosis not present

## 2015-01-07 DIAGNOSIS — Z48812 Encounter for surgical aftercare following surgery on the circulatory system: Secondary | ICD-10-CM | POA: Diagnosis not present

## 2015-01-07 DIAGNOSIS — I739 Peripheral vascular disease, unspecified: Secondary | ICD-10-CM | POA: Diagnosis not present

## 2015-01-07 DIAGNOSIS — I214 Non-ST elevation (NSTEMI) myocardial infarction: Secondary | ICD-10-CM | POA: Diagnosis not present

## 2015-01-07 DIAGNOSIS — I10 Essential (primary) hypertension: Secondary | ICD-10-CM | POA: Diagnosis not present

## 2015-01-07 DIAGNOSIS — J449 Chronic obstructive pulmonary disease, unspecified: Secondary | ICD-10-CM | POA: Diagnosis not present

## 2015-01-08 ENCOUNTER — Ambulatory Visit (INDEPENDENT_AMBULATORY_CARE_PROVIDER_SITE_OTHER): Payer: Medicare Other | Admitting: *Deleted

## 2015-01-08 DIAGNOSIS — Z951 Presence of aortocoronary bypass graft: Secondary | ICD-10-CM | POA: Diagnosis not present

## 2015-01-08 DIAGNOSIS — I82401 Acute embolism and thrombosis of unspecified deep veins of right lower extremity: Secondary | ICD-10-CM

## 2015-01-08 DIAGNOSIS — Z5181 Encounter for therapeutic drug level monitoring: Secondary | ICD-10-CM | POA: Diagnosis not present

## 2015-01-08 DIAGNOSIS — I2699 Other pulmonary embolism without acute cor pulmonale: Secondary | ICD-10-CM

## 2015-01-08 DIAGNOSIS — Z7189 Other specified counseling: Secondary | ICD-10-CM | POA: Insufficient documentation

## 2015-01-08 DIAGNOSIS — I214 Non-ST elevation (NSTEMI) myocardial infarction: Secondary | ICD-10-CM

## 2015-01-08 LAB — POCT INR: INR: 4.9

## 2015-01-08 MED ORDER — WARFARIN SODIUM 5 MG PO TABS: ORAL_TABLET | ORAL | Status: DC

## 2015-01-08 NOTE — Patient Instructions (Signed)

## 2015-01-10 DIAGNOSIS — I2510 Atherosclerotic heart disease of native coronary artery without angina pectoris: Secondary | ICD-10-CM | POA: Diagnosis not present

## 2015-01-10 DIAGNOSIS — I10 Essential (primary) hypertension: Secondary | ICD-10-CM | POA: Diagnosis not present

## 2015-01-10 DIAGNOSIS — Z48812 Encounter for surgical aftercare following surgery on the circulatory system: Secondary | ICD-10-CM | POA: Diagnosis not present

## 2015-01-10 DIAGNOSIS — I214 Non-ST elevation (NSTEMI) myocardial infarction: Secondary | ICD-10-CM | POA: Diagnosis not present

## 2015-01-10 DIAGNOSIS — J449 Chronic obstructive pulmonary disease, unspecified: Secondary | ICD-10-CM | POA: Diagnosis not present

## 2015-01-10 DIAGNOSIS — I739 Peripheral vascular disease, unspecified: Secondary | ICD-10-CM | POA: Diagnosis not present

## 2015-01-13 ENCOUNTER — Ambulatory Visit (INDEPENDENT_AMBULATORY_CARE_PROVIDER_SITE_OTHER): Payer: Medicare Other | Admitting: *Deleted

## 2015-01-13 ENCOUNTER — Ambulatory Visit (INDEPENDENT_AMBULATORY_CARE_PROVIDER_SITE_OTHER): Payer: Medicare Other | Admitting: Nurse Practitioner

## 2015-01-13 ENCOUNTER — Encounter: Payer: Self-pay | Admitting: Nurse Practitioner

## 2015-01-13 VITALS — BP 140/100 | HR 91 | Ht 71.0 in | Wt 179.0 lb

## 2015-01-13 DIAGNOSIS — I214 Non-ST elevation (NSTEMI) myocardial infarction: Secondary | ICD-10-CM

## 2015-01-13 DIAGNOSIS — I2699 Other pulmonary embolism without acute cor pulmonale: Secondary | ICD-10-CM | POA: Diagnosis not present

## 2015-01-13 DIAGNOSIS — I10 Essential (primary) hypertension: Secondary | ICD-10-CM | POA: Diagnosis not present

## 2015-01-13 DIAGNOSIS — I82401 Acute embolism and thrombosis of unspecified deep veins of right lower extremity: Secondary | ICD-10-CM

## 2015-01-13 DIAGNOSIS — I2510 Atherosclerotic heart disease of native coronary artery without angina pectoris: Secondary | ICD-10-CM

## 2015-01-13 DIAGNOSIS — Z5181 Encounter for therapeutic drug level monitoring: Secondary | ICD-10-CM | POA: Diagnosis not present

## 2015-01-13 DIAGNOSIS — Z951 Presence of aortocoronary bypass graft: Secondary | ICD-10-CM

## 2015-01-13 LAB — POCT INR: INR: 4.1

## 2015-01-13 MED ORDER — TRAMADOL HCL 50 MG PO TABS: 50.0000 mg | ORAL_TABLET | Freq: Four times a day (QID) | ORAL | Status: AC | PRN

## 2015-01-13 NOTE — Patient Instructions (Addendum)
We will be checking the following labs today - BMET/CBC   Medication Instructions:    Continue with your current medicines.   Stop Plavix  I have given you a RX for Ultram to take prn.    Testing/Procedures To Be Arranged:  N/A  Follow-Up:   See me in 2 weeks    Other Special Instructions:   N/A  Call the [place] office at [phone] if you have any questions, problems or concerns.

## 2015-01-13 NOTE — Progress Notes (Signed)
CARDIOLOGY OFFICE NOTE  Date:  01/13/2015    [name] Date of Birth: 11/26/1950 Medical Record [id number]  PCP:  Donnie Coffin, MD  Cardiologist:  Burt Knack    Chief Complaint  Patient presents with  . Post CABG - complicated by DVT/PE    Seen for Dr. Burt Knack    History of Present Illness: [name] is a 64 y.o. male who presents today for a post hospital visit. Seen for Dr. Burt Knack. Former patient of Dr. Susa Simmonds. He has an extensive history of PVD and CAD. He was followed by Dr. Bridgett Larsson at [facility] but now seeing Vascular in [facility]. Prior cath in 2006 showing normal LV function with a totally occluded RCA with left to right collaterals. He has distal small vessel disease in the RCA with diffuse mild to moderate stenosis in the LAD. No longer smoking. His PVD history is quite extensive and includes AAA (with no plans to intervene due to the nature of his aneurysm and his multiple comorbidities).   Other problems include HTN, HLD and OA. He has a history of hyponatremia as well.  I saw him earlier this summer - he was doing ok.  Presented back in July with chest pain/NSTEMI and underwent CABG x 5 by Dr. Cyndia Bent. He had vein harvesting from the right lower extremity. He then presented with progressive right lower extremity swelling and discomfort. He followed up with the cardiac surgery office on 8/2 and was started on Lasix and potassium for bilateral lower extremity edema. Unfortunately although the swelling decreased somewhat (especially on the left lower extremity with the use of Lasix) the patient continued with significant pain which influenced his mobility. He fell at home and he was on the floor for at least 2 hours before his wife found him and she brought him back to the ER.   He was found to have acute RLE DVT and PE. Was on IV heparin, transitioned to Xarelto due to drop in platelets, HIT panel + and serotonin release assay + as well and he was changed to Arixtra and  seen by Dr. Alen Blew. Warfarin started and he is to be on 6 months of therapy. He did require transfusion of 2 units PRBCs.   He comes in today. Here with his wife. Weekly INRs. No energy. Sleeps a lot. Not doing much. Won't eat. Still with considerable surgical pain. Says he "hurts all over". PT to start with him later this week. Weight is down. Right leg still uncomfortable.   Past Medical History  Diagnosis Date  . Hyperlipidemia   . Coronary artery disease   . AAA (abdominal aortic aneurysm)   . PAD (peripheral artery disease)   . COPD (chronic obstructive pulmonary disease)   . Raynaud's disease /phenomenon   . Lumbar degenerative disc disease   . Stroke 10/15/2005  . PONV (postoperative nausea and vomiting)   . Hypertension     takes meds daily  . Anxiety   . GERD (gastroesophageal reflux disease)   . History of blood transfusion   . Hyponatremia   . Myocardial infarction   . CHF (congestive heart failure)     Past Surgical History  Procedure Laterality Date  . Iliac artery stent  10/15/2005     Left common and external   . Cardiac catheterization  2006  . Carotid endarterectomy      bilateral  . Carotid endarterectomy  04/13/2005    Right and 06/03/2005 Left  . Abdominal angiogram  03/30/2012  . Hernia repair    . Abdominal aortagram N/A 03/30/2012    Procedure: ABDOMINAL Maxcine Ham;  Surgeon: Conrad Leland, MD;  Location[facility] CATH LAB;  Service: Cardiovascular;  Laterality: N/A;  . Cardiac catheterization N/A 12/09/2014    Procedure: Left Heart Cath and Coronary Angiography;  Surgeon: [name], MD;  Location: [place] CV LAB;  Service: Cardiovascular;  Laterality: N/A;  . Coronary artery bypass graft N/A 12/13/2014    Procedure: CORONARY ARTERY BYPASS GRAFT times five using left internal mammary artery and endoscopic right leg saphenous vein harvest;  Surgeon: Gaye Pollack, MD;  Location: [place] OR;  Service: Open Heart Surgery;  Laterality: N/A;  . Intraoperative  transesophageal echocardiogram N/A 12/13/2014    Procedure: INTRAOPERATIVE TRANSESOPHAGEAL ECHOCARDIOGRAM;  Surgeon: Gaye Pollack, MD;  Location: [facility] OR;  Service: Open Heart Surgery;  Laterality: N/A;     Medications: Current Outpatient Prescriptions  Medication Sig Dispense Refill  . acetaminophen (TYLENOL) 325 MG tablet Take 650 mg by mouth every 6 (six) hours as needed for mild pain or headache.    Marland Kitchen aspirin 81 MG tablet Take 81 mg by mouth every evening.     Marland Kitchen CRESTOR 20 MG tablet take 1 tablet by mouth once daily at bedtime (Patient taking differently: Take 20mg  by mouth at bedtime) 30 tablet 5  . lisinopril (PRINIVIL,ZESTRIL) 5 MG tablet Take 1 tablet (5 mg total) by mouth daily. 30 tablet 1  . metoprolol (LOPRESSOR) 50 MG tablet Take 1 tablet (50 mg total) by mouth 2 (two) times daily. 60 tablet 0  . NITROSTAT 0.4 MG SL tablet Place 0.4 mg under the tongue every 5 (five) minutes as needed. Chest pain  0  . oxyCODONE (OXY IR/ROXICODONE) 5 MG immediate release tablet Take 1-2 tablets (5-10 mg total) by mouth every 4 (four) hours as needed for severe pain. 30 tablet 0  . ranitidine (ZANTAC) 300 MG tablet take 1 tablet by mouth at bedtime (Patient taking differently: Take 300mg  by mouth at bedtime) 30 tablet 6  . Thiamine HCl (VITAMIN B-1) 100 MG tablet Take 100 mg by mouth daily.      Marland Kitchen warfarin (COUMADIN) 5 MG tablet Take as directed by coumadin clinic 30 tablet 3  . traMADol (ULTRAM) 50 MG tablet Take 1 tablet (50 mg total) by mouth every 6 (six) hours as needed. 30 tablet 0   No current facility-administered medications for this visit.    Allergies: Allergies  Allergen Reactions  . Norvasc [Amlodipine Besylate] Swelling       . Pletal [Cilostazol] Diarrhea  . Heparin Other (See Comments)    HIT positive as of 12/31/14    Social History: The patient  reports that he quit smoking about 9 years ago. His smoking use included Cigarettes. He has never used smokeless tobacco. He  reports that he drinks about 14.4 oz of alcohol per week. He reports that he does not use illicit drugs.   Family History: The patient's family history includes Coronary artery disease (age of onset: 38) in his father; Dementia in his mother; Heart disease in his brother and father; Hyperlipidemia in his brother and father; Hypertension in his brother and father; Other (age of onset: 30) in his mother; Peripheral vascular disease in his father.   Review of Systems: Please see the history of present illness.   Otherwise, the review of systems is positive for none.   All other systems are reviewed and negative.   Physical Exam: VS:  BP 140/100 mmHg  Pulse 91  Ht 5\' 11"  (1.803 m)  Wt 179 lb (81.194 kg)  BMI 24.98 kg/m2 .  BMI Body mass index is 24.98 kg/(m^2).  Wt Readings from Last 3 Encounters:  01/13/15 179 lb (81.194 kg)  01/05/15 184 lb 8.4 oz (83.7 kg)  12/18/14 196 lb 4.8 oz (89.041 kg)    General: Pleasant. He looks a little weak/frail but in no acute distress. Weight is way down. Some bruising noted. Sternum looks good.  HEENT: Normal. Neck: Supple, no JVD, carotid bruits, or masses noted.  Cardiac: Regular rate and rhythm. No murmurs, rubs, or gallops. No edema.  Respiratory:  Lungs are clear to auscultation bilaterally with normal work of breathing.  GI: Soft and nontender.  MS: No deformity or atrophy. Gait and ROM intact. Skin: Warm and dry. Color is normal.  Neuro:  Strength and sensation are intact and no gross focal deficits noted.  Psych: Alert, appropriate and with normal affect.   LABORATORY DATA:  EKG:  EKG is ordered today. This demonstrates NSR.  Lab Results  Component Value Date   WBC 11.1* 01/05/2015   HGB 10.0* 01/05/2015   HCT 30.6* 01/05/2015   PLT 155 01/05/2015   GLUCOSE 107* 01/05/2015   CHOL 107 12/08/2014   TRIG 57 12/08/2014   HDL 55 12/08/2014   LDLCALC 41 12/08/2014   ALT 17 12/27/2014   AST 24 12/27/2014   NA 133* 01/05/2015   K 4.1  01/05/2015   CL 103 01/05/2015   CREATININE 1.08 01/05/2015   BUN 15 01/05/2015   CO2 19* 01/05/2015   TSH 0.74 01/12/2013   INR 4.1 01/13/2015   HGBA1C 5.4 12/12/2014    BNP (last 3 results)  Recent Labs  12/27/14 1028  BNP 568.1*    ProBNP (last 3 results)  Recent Labs  11/08/14 1544  PROBNP 179.0*     Other Studies Reviewed Today: N/A  Assessment/Plan:  Acute RLE DVT and PE -was on IV Heparin, this was transitioned to Xarelto on 8/8, due to drop in platelets -HIT panel positive, changed to Arixtra 8/10 -seen by hematology Dr.Shadad in consultation -started warfarin 8/11 -continue warfarin for at least 6 months, FU with Hematology Dr.Shadad  I have talked with Dr. Angelena Form - we are stopping his Plavix given the recent turn of events. We do not feel he needs to be on triple therapy.   Low grade fever -could be related to acute PE Vs pulm infarcts -wife to continue to monitor his temperature  Anemia-post op blood loss anemia - Transfused 2 units PRBC with good response, stable - Rechecking labs today  Thrombocytopenia - Heparin induced clinically and HIt Ab panel positive - Serotonin release assay positive as well and hence HIT confirmed - heme consult per Dr.Shadad completed - Rechecking his labs today.    Acute renal failure/dehydration with hyponatremia -rechecking lab today   Essential hypertension, benign - he is back on some of his medicines - now on Metoprolol in the place of Coreg. Off Hydralazine and on lower dose of ACE. Will monitor for now. Recheck by me is 110/80 by me.   CAD/recent NSTEMI/S/P CABG x 5 (12/13/14) - Continue beta blocker, crestor   PAD  -Stable    AAA  -Very complex and due to degree of calcification has been deemed not electively operable -Followed by [facility] Sammuel [facility]  Current medicines are reviewed with the patient today.  The patient does not have concerns regarding medicines  other than what has  been noted above.  The following changes have been made:  See above.  Labs/ tests ordered today include:    Orders Placed This Encounter  Procedures  . Basic metabolic panel  . CBC  . EKG 12-Lead     Disposition:   FU with me in 2 weeks.   Patient is agreeable to this plan and will call if any problems develop in the interim.   Signed: Burtis Junes, RN, ANP-C 01/13/2015 3:14 PM  [facility] [address] [place], [place]  [address] Phone: [phone] Fax: [phone]

## 2015-01-14 ENCOUNTER — Other Ambulatory Visit: Payer: Self-pay | Admitting: *Deleted

## 2015-01-14 DIAGNOSIS — D72829 Elevated white blood cell count, unspecified: Secondary | ICD-10-CM

## 2015-01-14 DIAGNOSIS — I2510 Atherosclerotic heart disease of native coronary artery without angina pectoris: Secondary | ICD-10-CM | POA: Diagnosis not present

## 2015-01-14 DIAGNOSIS — J449 Chronic obstructive pulmonary disease, unspecified: Secondary | ICD-10-CM | POA: Diagnosis not present

## 2015-01-14 DIAGNOSIS — I739 Peripheral vascular disease, unspecified: Secondary | ICD-10-CM | POA: Diagnosis not present

## 2015-01-14 DIAGNOSIS — I214 Non-ST elevation (NSTEMI) myocardial infarction: Secondary | ICD-10-CM | POA: Diagnosis not present

## 2015-01-14 DIAGNOSIS — I10 Essential (primary) hypertension: Secondary | ICD-10-CM | POA: Diagnosis not present

## 2015-01-14 DIAGNOSIS — Z48812 Encounter for surgical aftercare following surgery on the circulatory system: Secondary | ICD-10-CM | POA: Diagnosis not present

## 2015-01-14 LAB — BASIC METABOLIC PANEL
BUN: 17 mg/dL (ref 6–23)
CO2: 23 mEq/L (ref 19–32)
Calcium: 9.5 mg/dL (ref 8.4–10.5)
Chloride: 98 mEq/L (ref 96–112)
Creatinine, Ser: 1.01 mg/dL (ref 0.40–1.50)
GFR: 78.43 mL/min (ref >60.00)
Glucose, Bld: 98 mg/dL (ref 70–99)
Potassium: 4.5 mEq/L (ref 3.5–5.1)
Sodium: 132 mEq/L — ABNORMAL LOW (ref 135–145)

## 2015-01-14 LAB — CBC
HCT: 32.7 % — ABNORMAL LOW (ref 39.0–52.0)
Hemoglobin: 10.9 g/dL — ABNORMAL LOW (ref 13.0–17.0)
MCHC: 33.4 g/dL (ref 30.0–36.0)
MCV: 93.1 fl (ref 78.0–100.0)
Platelets: 530 10*3/uL — ABNORMAL HIGH (ref 150.0–400.0)
RBC: 3.51 Mil/uL — ABNORMAL LOW (ref 4.22–5.81)
RDW: 16.9 % — ABNORMAL HIGH (ref 11.5–15.5)
WBC: 16 10*3/uL — ABNORMAL HIGH (ref 4.0–10.5)

## 2015-01-16 DIAGNOSIS — J449 Chronic obstructive pulmonary disease, unspecified: Secondary | ICD-10-CM | POA: Diagnosis not present

## 2015-01-16 DIAGNOSIS — I214 Non-ST elevation (NSTEMI) myocardial infarction: Secondary | ICD-10-CM | POA: Diagnosis not present

## 2015-01-16 DIAGNOSIS — I10 Essential (primary) hypertension: Secondary | ICD-10-CM | POA: Diagnosis not present

## 2015-01-16 DIAGNOSIS — Z48812 Encounter for surgical aftercare following surgery on the circulatory system: Secondary | ICD-10-CM | POA: Diagnosis not present

## 2015-01-16 DIAGNOSIS — I739 Peripheral vascular disease, unspecified: Secondary | ICD-10-CM | POA: Diagnosis not present

## 2015-01-16 DIAGNOSIS — I2510 Atherosclerotic heart disease of native coronary artery without angina pectoris: Secondary | ICD-10-CM | POA: Diagnosis not present

## 2015-01-17 ENCOUNTER — Other Ambulatory Visit (INDEPENDENT_AMBULATORY_CARE_PROVIDER_SITE_OTHER): Payer: Medicare Other

## 2015-01-17 DIAGNOSIS — D72829 Elevated white blood cell count, unspecified: Secondary | ICD-10-CM | POA: Diagnosis not present

## 2015-01-17 LAB — CBC WITH DIFFERENTIAL/PLATELET
Basophils Absolute: 0.1 10*3/uL (ref 0.0–0.1)
Basophils Relative: 0.8 % (ref 0.0–3.0)
Eosinophils Absolute: 0.2 10*3/uL (ref 0.0–0.7)
Eosinophils Relative: 1.9 % (ref 0.0–5.0)
HCT: 30.1 % — ABNORMAL LOW (ref 39.0–52.0)
Hemoglobin: 9.9 g/dL — ABNORMAL LOW (ref 13.0–17.0)
Lymphocytes Relative: 8.3 % — ABNORMAL LOW (ref 12.0–46.0)
Lymphs Abs: 0.9 10*3/uL (ref 0.7–4.0)
MCHC: 33.1 g/dL (ref 30.0–36.0)
MCV: 91.7 fl (ref 78.0–100.0)
Monocytes Absolute: 0.8 10*3/uL (ref 0.1–1.0)
Monocytes Relative: 7.5 % (ref 3.0–12.0)
Neutro Abs: 9 10*3/uL — ABNORMAL HIGH (ref 1.4–7.7)
Neutrophils Relative %: 81.5 % — ABNORMAL HIGH (ref 43.0–77.0)
Platelets: 394 10*3/uL (ref 150.0–400.0)
RBC: 3.28 Mil/uL — ABNORMAL LOW (ref 4.22–5.81)
RDW: 16.4 % — ABNORMAL HIGH (ref 11.5–15.5)
WBC: 11 10*3/uL — ABNORMAL HIGH (ref 4.0–10.5)

## 2015-01-18 DIAGNOSIS — I739 Peripheral vascular disease, unspecified: Secondary | ICD-10-CM | POA: Diagnosis not present

## 2015-01-18 DIAGNOSIS — J449 Chronic obstructive pulmonary disease, unspecified: Secondary | ICD-10-CM | POA: Diagnosis not present

## 2015-01-18 DIAGNOSIS — Z48812 Encounter for surgical aftercare following surgery on the circulatory system: Secondary | ICD-10-CM | POA: Diagnosis not present

## 2015-01-18 DIAGNOSIS — I2510 Atherosclerotic heart disease of native coronary artery without angina pectoris: Secondary | ICD-10-CM | POA: Diagnosis not present

## 2015-01-18 DIAGNOSIS — I214 Non-ST elevation (NSTEMI) myocardial infarction: Secondary | ICD-10-CM | POA: Diagnosis not present

## 2015-01-18 DIAGNOSIS — I10 Essential (primary) hypertension: Secondary | ICD-10-CM | POA: Diagnosis not present

## 2015-01-20 ENCOUNTER — Telehealth: Payer: Self-pay | Admitting: Oncology

## 2015-01-20 ENCOUNTER — Ambulatory Visit (INDEPENDENT_AMBULATORY_CARE_PROVIDER_SITE_OTHER): Payer: Self-pay | Admitting: Surgical

## 2015-01-20 ENCOUNTER — Ambulatory Visit (INDEPENDENT_AMBULATORY_CARE_PROVIDER_SITE_OTHER): Payer: Medicare Other | Admitting: *Deleted

## 2015-01-20 ENCOUNTER — Ambulatory Visit: Payer: Medicare Other

## 2015-01-20 VITALS — BP 100/65 | HR 88 | Resp 16 | Ht 71.0 in | Wt 179.0 lb

## 2015-01-20 DIAGNOSIS — I2699 Other pulmonary embolism without acute cor pulmonale: Secondary | ICD-10-CM

## 2015-01-20 DIAGNOSIS — Z951 Presence of aortocoronary bypass graft: Secondary | ICD-10-CM

## 2015-01-20 DIAGNOSIS — I82401 Acute embolism and thrombosis of unspecified deep veins of right lower extremity: Secondary | ICD-10-CM | POA: Diagnosis not present

## 2015-01-20 DIAGNOSIS — I2510 Atherosclerotic heart disease of native coronary artery without angina pectoris: Secondary | ICD-10-CM

## 2015-01-20 DIAGNOSIS — Z5181 Encounter for therapeutic drug level monitoring: Secondary | ICD-10-CM | POA: Diagnosis not present

## 2015-01-20 DIAGNOSIS — I214 Non-ST elevation (NSTEMI) myocardial infarction: Secondary | ICD-10-CM

## 2015-01-20 LAB — POCT INR: INR: 2.2

## 2015-01-20 NOTE — Telephone Encounter (Signed)
Hosp f/u-s/w patient wife Gae Bon and gave appt for 09/15 @ 10:45 lab/11 md appt.

## 2015-01-20 NOTE — Progress Notes (Signed)
[address]       [place] [address]             [phone]                  Reshaun H Comp Schertz Medical Record [id number] Date of Birth: 21-Aug-1950  Referring [id number], L.Marlou Sa, MD Primary Cardiology: Primary Care:Dean Alroy Dust, MD  Chief Complaint:  Follow Up Visit  Expand All Collapse All     CARDIOVASCULAR SURGERY OPERATIVE NOTE  12/13/2014  Surgeon: Gaye Pollack, MD  First Assistant: Jadene Pierini, PA-C   Preoperative Diagnosis: Severe multi-vessel coronary artery disease   Postoperative Diagnosis: Same   Procedure:  1. Median Sternotomy 2. Extracorporeal circulation 3. Coronary artery bypass grafting x 5   Left internal mammary graft to the LAD  SVG to diagonal  SVG to Ramus  SVG to OM  SVG to PDA 4. Endoscopic vein harvest from the right leg   Anesthesia: General Endotracheal        History of Present Illness:    The patient is a 64 year old male status post above procedure seen in office on today's date for routine follow-up. Of note he was readmitted after his surgery where he did rule in for right DVT as well as pulmonary embolism. He is currently on Coumadin and INR is to be done on today's date. Most recent have been supratherapeutic and Coumadin dosing has been adjusted. His primary complaint currently is right lower extremity discomfort. He has known severe peripheral arterial disease as well as a nonoperable abdominal aortic aneurysm. He is followed closely by vascular surgery at [facility], Dr. Sammuel Hines. Otherwise, the patient is feeling fairly well although he describes having good and bad days. On the days he is fairly weak and does not do much physical activity. He is getting physical therapy at home. He is making some progress in this regard. He has had no recent fevers, chills or other constitutional  symptoms.         Zubrod Score: At the time of surgery this patient's most appropriate activity status/level should be described as: []     0    Normal activity, no symptoms []     1    Restricted in physical strenuous activity but ambulatory, able to do out light work [x]     2    Ambulatory and capable of self care, unable to do work activities, up and about                 >50 % of waking hours                                                                                   []     3    Only limited self care, in bed greater than 50% of waking hours []     4    Completely disabled, no self care, confined to bed or chair []     5    Moribund  History  Smoking status  . Former Smoker  . Types: Cigarettes  . Quit date: 05/24/2005  Smokeless tobacco  . Never Used  Allergies  Allergen Reactions  . Norvasc [Amlodipine Besylate] Swelling       . Pletal [Cilostazol] Diarrhea  . Heparin Other (See Comments)    HIT positive as of 12/31/14    Current Outpatient Prescriptions  Medication Sig Dispense Refill  . acetaminophen (TYLENOL) 325 MG tablet Take 650 mg by mouth every 6 (six) hours as needed for mild pain or headache.    Marland Kitchen aspirin 81 MG tablet Take 81 mg by mouth every evening.     Marland Kitchen CRESTOR 20 MG tablet take 1 tablet by mouth once daily at bedtime (Patient taking differently: Take 20mg  by mouth at bedtime) 30 tablet 5  . lisinopril (PRINIVIL,ZESTRIL) 5 MG tablet Take 1 tablet (5 mg total) by mouth daily. 30 tablet 1  . metoprolol (LOPRESSOR) 50 MG tablet Take 1 tablet (50 mg total) by mouth 2 (two) times daily. 60 tablet 0  . NITROSTAT 0.4 MG SL tablet Place 0.4 mg under the tongue every 5 (five) minutes as needed. Chest pain  0  . oxyCODONE (OXY IR/ROXICODONE) 5 MG immediate release tablet Take 1-2 tablets (5-10 mg total) by mouth every 4 (four) hours as needed for severe pain. 30 tablet 0  . ranitidine (ZANTAC) 300 MG tablet take 1 tablet by mouth at bedtime (Patient  taking differently: Take 300mg  by mouth at bedtime) 30 tablet 6  . Thiamine HCl (VITAMIN B-1) 100 MG tablet Take 100 mg by mouth daily.      . traMADol (ULTRAM) 50 MG tablet Take 1 tablet (50 mg total) by mouth every 6 (six) hours as needed. 30 tablet 0  . warfarin (COUMADIN) 5 MG tablet Take as directed by coumadin clinic 30 tablet 3   No current facility-administered medications for this visit.       Physical Exam: BP 100/65 mmHg  Pulse 88  Resp 16  Ht 5\' 11"  (1.803 m)  Wt 179 lb (81.194 kg)  BMI 24.98 kg/m2  SpO2 95%  General appearance: alert, cooperative, distracted and no distress Heart: regular rate and rhythm and Absent pedal pulses Lungs: clear to auscultation bilaterally Abdomen: Benign Extremities: Right greater than left lower extremity edema. Feet warm. No ischemic changes or cyanosis. Wounds: Incisions healing well without evidence of infection.  Diagnostic Studies & Laboratory data:         Recent Radiology Findings: No results found.    I have independently reviewed the above radiology findings and reviewed findings  with the patient.  Recent Labs: Lab Results  Component Value Date   WBC 11.0* 01/17/2015   HGB 9.9* 01/17/2015   HCT 30.1* 01/17/2015   PLT 394.0 01/17/2015   GLUCOSE 98 01/13/2015   CHOL 107 12/08/2014   TRIG 57 12/08/2014   HDL 55 12/08/2014   LDLCALC 41 12/08/2014   ALT 17 12/27/2014   AST 24 12/27/2014   NA 132* 01/13/2015   K 4.5 01/13/2015   CL 98 01/13/2015   CREATININE 1.01 01/13/2015   BUN 17 01/13/2015   CO2 23 01/13/2015   TSH 0.74 01/12/2013   INR 4.1 01/13/2015   HGBA1C 5.4 12/12/2014      Assessment / Plan:  The patient is improving in his recovery from CABG. The right lower extremity findings are multifactorial related to the deep venous thrombosis, endoscopic vein harvest, and significant referral arterial disease. He is not acutely ischemic. He is to continue taking Ultram when necessary. I have encouraged his  family to have a follow-up examination by Dr. Sammuel Hines at  [facility] to assess the status of his peripheral arterial disease. We will see him again in 1 month with a chest x-ray and when necessary         Rhythm Gubbels E 01/20/2015 1:50 PM

## 2015-01-21 DIAGNOSIS — J449 Chronic obstructive pulmonary disease, unspecified: Secondary | ICD-10-CM | POA: Diagnosis not present

## 2015-01-21 DIAGNOSIS — I739 Peripheral vascular disease, unspecified: Secondary | ICD-10-CM | POA: Diagnosis not present

## 2015-01-21 DIAGNOSIS — Z48812 Encounter for surgical aftercare following surgery on the circulatory system: Secondary | ICD-10-CM | POA: Diagnosis not present

## 2015-01-21 DIAGNOSIS — I10 Essential (primary) hypertension: Secondary | ICD-10-CM | POA: Diagnosis not present

## 2015-01-21 DIAGNOSIS — I2510 Atherosclerotic heart disease of native coronary artery without angina pectoris: Secondary | ICD-10-CM | POA: Diagnosis not present

## 2015-01-21 DIAGNOSIS — I214 Non-ST elevation (NSTEMI) myocardial infarction: Secondary | ICD-10-CM | POA: Diagnosis not present

## 2015-01-22 ENCOUNTER — Ambulatory Visit: Payer: Medicare Other | Admitting: Surgery

## 2015-01-22 DIAGNOSIS — I739 Peripheral vascular disease, unspecified: Secondary | ICD-10-CM | POA: Diagnosis not present

## 2015-01-22 DIAGNOSIS — I2510 Atherosclerotic heart disease of native coronary artery without angina pectoris: Secondary | ICD-10-CM | POA: Diagnosis not present

## 2015-01-22 DIAGNOSIS — Z48812 Encounter for surgical aftercare following surgery on the circulatory system: Secondary | ICD-10-CM | POA: Diagnosis not present

## 2015-01-22 DIAGNOSIS — I10 Essential (primary) hypertension: Secondary | ICD-10-CM | POA: Diagnosis not present

## 2015-01-22 DIAGNOSIS — J449 Chronic obstructive pulmonary disease, unspecified: Secondary | ICD-10-CM | POA: Diagnosis not present

## 2015-01-22 DIAGNOSIS — I214 Non-ST elevation (NSTEMI) myocardial infarction: Secondary | ICD-10-CM | POA: Diagnosis not present

## 2015-01-23 DIAGNOSIS — I2510 Atherosclerotic heart disease of native coronary artery without angina pectoris: Secondary | ICD-10-CM | POA: Diagnosis not present

## 2015-01-23 DIAGNOSIS — I10 Essential (primary) hypertension: Secondary | ICD-10-CM | POA: Diagnosis not present

## 2015-01-23 DIAGNOSIS — I214 Non-ST elevation (NSTEMI) myocardial infarction: Secondary | ICD-10-CM | POA: Diagnosis not present

## 2015-01-23 DIAGNOSIS — J449 Chronic obstructive pulmonary disease, unspecified: Secondary | ICD-10-CM | POA: Diagnosis not present

## 2015-01-23 DIAGNOSIS — I739 Peripheral vascular disease, unspecified: Secondary | ICD-10-CM | POA: Diagnosis not present

## 2015-01-23 DIAGNOSIS — Z48812 Encounter for surgical aftercare following surgery on the circulatory system: Secondary | ICD-10-CM | POA: Diagnosis not present

## 2015-01-24 DIAGNOSIS — I214 Non-ST elevation (NSTEMI) myocardial infarction: Secondary | ICD-10-CM | POA: Diagnosis not present

## 2015-01-24 DIAGNOSIS — Z48812 Encounter for surgical aftercare following surgery on the circulatory system: Secondary | ICD-10-CM | POA: Diagnosis not present

## 2015-01-24 DIAGNOSIS — I10 Essential (primary) hypertension: Secondary | ICD-10-CM | POA: Diagnosis not present

## 2015-01-24 DIAGNOSIS — I2510 Atherosclerotic heart disease of native coronary artery without angina pectoris: Secondary | ICD-10-CM | POA: Diagnosis not present

## 2015-01-24 DIAGNOSIS — I739 Peripheral vascular disease, unspecified: Secondary | ICD-10-CM | POA: Diagnosis not present

## 2015-01-24 DIAGNOSIS — J449 Chronic obstructive pulmonary disease, unspecified: Secondary | ICD-10-CM | POA: Diagnosis not present

## 2015-01-27 DIAGNOSIS — I10 Essential (primary) hypertension: Secondary | ICD-10-CM | POA: Diagnosis not present

## 2015-01-27 DIAGNOSIS — Z48812 Encounter for surgical aftercare following surgery on the circulatory system: Secondary | ICD-10-CM | POA: Diagnosis not present

## 2015-01-27 DIAGNOSIS — I214 Non-ST elevation (NSTEMI) myocardial infarction: Secondary | ICD-10-CM | POA: Diagnosis not present

## 2015-01-27 DIAGNOSIS — J449 Chronic obstructive pulmonary disease, unspecified: Secondary | ICD-10-CM | POA: Diagnosis not present

## 2015-01-27 DIAGNOSIS — I2510 Atherosclerotic heart disease of native coronary artery without angina pectoris: Secondary | ICD-10-CM | POA: Diagnosis not present

## 2015-01-27 DIAGNOSIS — I739 Peripheral vascular disease, unspecified: Secondary | ICD-10-CM | POA: Diagnosis not present

## 2015-01-28 ENCOUNTER — Ambulatory Visit (INDEPENDENT_AMBULATORY_CARE_PROVIDER_SITE_OTHER): Payer: Medicare Other | Admitting: Nurse Practitioner

## 2015-01-28 ENCOUNTER — Encounter: Payer: Self-pay | Admitting: Nurse Practitioner

## 2015-01-28 ENCOUNTER — Ambulatory Visit (INDEPENDENT_AMBULATORY_CARE_PROVIDER_SITE_OTHER): Payer: Medicare Other | Admitting: Pharmacist Clinician (PhC)/ Clinical Pharmacy Specialist

## 2015-01-28 VITALS — BP 110/70 | HR 91 | Resp 18 | Ht 71.0 in | Wt 177.5 lb

## 2015-01-28 DIAGNOSIS — Z951 Presence of aortocoronary bypass graft: Secondary | ICD-10-CM | POA: Diagnosis not present

## 2015-01-28 DIAGNOSIS — I2699 Other pulmonary embolism without acute cor pulmonale: Secondary | ICD-10-CM

## 2015-01-28 DIAGNOSIS — Z5181 Encounter for therapeutic drug level monitoring: Secondary | ICD-10-CM

## 2015-01-28 DIAGNOSIS — I214 Non-ST elevation (NSTEMI) myocardial infarction: Secondary | ICD-10-CM

## 2015-01-28 DIAGNOSIS — I2510 Atherosclerotic heart disease of native coronary artery without angina pectoris: Secondary | ICD-10-CM

## 2015-01-28 DIAGNOSIS — R609 Edema, unspecified: Secondary | ICD-10-CM | POA: Diagnosis not present

## 2015-01-28 DIAGNOSIS — I82401 Acute embolism and thrombosis of unspecified deep veins of right lower extremity: Secondary | ICD-10-CM | POA: Diagnosis not present

## 2015-01-28 LAB — BASIC METABOLIC PANEL
BUN: 15 mg/dL (ref 6–23)
CO2: 26 mEq/L (ref 19–32)
Calcium: 9.2 mg/dL (ref 8.4–10.5)
Chloride: 95 mEq/L — ABNORMAL LOW (ref 96–112)
Creatinine, Ser: 0.97 mg/dL (ref 0.40–1.50)
GFR: 82.16 mL/min (ref >60.00)
Glucose, Bld: 92 mg/dL (ref 70–99)
Potassium: 4.3 mEq/L (ref 3.5–5.1)
Sodium: 130 mEq/L — ABNORMAL LOW (ref 135–145)

## 2015-01-28 LAB — CBC
HCT: 30.7 % — ABNORMAL LOW (ref 39.0–52.0)
Hemoglobin: 10.1 g/dL — ABNORMAL LOW (ref 13.0–17.0)
MCHC: 33 g/dL (ref 30.0–36.0)
MCV: 90.7 fl (ref 78.0–100.0)
Platelets: 295 10*3/uL (ref 150.0–400.0)
RBC: 3.38 Mil/uL — ABNORMAL LOW (ref 4.22–5.81)
RDW: 17.4 % — ABNORMAL HIGH (ref 11.5–15.5)
WBC: 9.6 10*3/uL (ref 4.0–10.5)

## 2015-01-28 LAB — POCT INR: INR: 1.5

## 2015-01-28 NOTE — Progress Notes (Signed)
CARDIOLOGY OFFICE NOTE  Date:  01/28/2015    [name] Date of Birth: [birth year] Medical Record [id number]  PCP:  Donnie Coffin, MD  Cardiologist:  Burt Knack    Chief Complaint  Patient presents with  . Coronary Artery Disease    Follow up visit - seen for Dr. Burt Knack    History of Present Illness: [name] is a 64 y.o. male who presents today for a follow up visit. Seen for Dr. Burt Knack. Former patient of Dr. Susa Simmonds. He has an extensive history of PVD and CAD. He was followed by Dr. Bridgett Larsson at [facility] but now seeing Vascular in [facility]. Prior cath in 2006 showing normal LV function with a totally occluded RCA with left to right collaterals. He has distal small vessel disease in the RCA with diffuse mild to moderate stenosis in the LAD. No longer smoking. His PVD history is quite extensive and includes AAA (with no plans to intervene due to the nature of his aneurysm and his multiple comorbidities).   Other problems include HTN, HLD and OA. He has a history of hyponatremia as well.  I saw him earlier this summer - he was doing ok.  Presented back in July with chest pain/NSTEMI and underwent CABG x 5 by Dr. Cyndia Bent. He had vein harvesting from the right lower extremity. He then presented with progressive right lower extremity swelling and discomfort. He followed up with the cardiac surgery office on 8/2 and was started on Lasix and potassium for bilateral lower extremity edema. Unfortunately although the swelling decreased somewhat (especially on the left lower extremity with the use of Lasix) the patient continued with significant pain which influenced his mobility. He fell at home and he was on the floor for at least 2 hours before his wife found him and she brought him back to the ER.   He was found to have acute RLE DVT and PE. Was on IV heparin, transitioned to Xarelto due to drop in platelets, HIT panel + and serotonin release assay + as well and he was changed to Arixtra  and seen by Dr. Alen Blew. Warfarin started and he is to be on 6 months of therapy. He did require transfusion of 2 units PRBCs.   I saw him just a few weeks ago - little puny but improving. Still with considerable right leg pain - I put him on Ultram. Plavix stopped at last visit.   He comes in today. Here with his wife. He seems stronger. Appetite improving - he is eating now - not a lot - but he is eating.  Down to a 36 inch waist size now and has had to buy some new shorts. Still with considerable pain in the right foot - probably multifactorial - arterial/recent DVT/venous insufficiency. Saw Jadene Pierini, [place] last week - encouraged to go see Dr. Sammuel Hines but that is really an ordeal to get him down there - lots of testing, going to different buildings, etc. It is really difficult to take him there and Kayvon feels like that since there "is nothing to do then not to go". He is working with PT. Taking Ultram with improvement in pain. Intolerant to Pletal. His wounds are healing. Still having good days and bad days. Probably a little depressed. He is no longer drinking alcohol. He continues to use a walker. No falls and seems more steady.  Past Medical History  Diagnosis Date  . Hyperlipidemia   . Coronary artery disease   .  AAA (abdominal aortic aneurysm)   . PAD (peripheral artery disease)   . COPD (chronic obstructive pulmonary disease)   . Raynaud's disease /phenomenon   . Lumbar degenerative disc disease   . Stroke 10/15/2005  . PONV (postoperative nausea and vomiting)   . Hypertension     takes meds daily  . Anxiety   . GERD (gastroesophageal reflux disease)   . History of blood transfusion   . Hyponatremia   . Myocardial infarction   . CHF (congestive heart failure)     Past Surgical History  Procedure Laterality Date  . Iliac artery stent  10/15/2005     Left common and external   . Cardiac catheterization  2006  . Carotid endarterectomy      bilateral  . Carotid endarterectomy   04/13/2005    Right and 06/03/2005 Left  . Abdominal angiogram  03/30/2012  . Hernia repair    . Abdominal aortagram N/A 03/30/2012    Procedure: ABDOMINAL Maxcine Ham;  Surgeon: Conrad Calvary, MD;  Location: [facility] CATH LAB;  Service: Cardiovascular;  Laterality: N/A;  . Cardiac catheterization N/A 12/09/2014    Procedure: Left Heart Cath and Coronary Angiography;  Surgeon: [name], MD;  Location: [place] CV LAB;  Service: Cardiovascular;  Laterality: N/A;  . Coronary artery bypass graft N/A 12/13/2014    Procedure: CORONARY ARTERY BYPASS GRAFT times five using left internal mammary artery and endoscopic right leg saphenous vein harvest;  Surgeon: Gaye Pollack, MD;  Location: [place] OR;  Service: Open Heart Surgery;  Laterality: N/A;  . Intraoperative transesophageal echocardiogram N/A 12/13/2014    Procedure: INTRAOPERATIVE TRANSESOPHAGEAL ECHOCARDIOGRAM;  Surgeon: Gaye Pollack, MD;  Location: [facility] OR;  Service: Open Heart Surgery;  Laterality: N/A;     Medications: Current Outpatient Prescriptions  Medication Sig Dispense Refill  . acetaminophen (TYLENOL) 325 MG tablet Take 650 mg by mouth every 6 (six) hours as needed for mild pain or headache.    Marland Kitchen aspirin 81 MG tablet Take 81 mg by mouth every evening.     Marland Kitchen CRESTOR 20 MG tablet take 1 tablet by mouth once daily at bedtime (Patient taking differently: Take 20mg  by mouth at bedtime) 30 tablet 5  . lisinopril (PRINIVIL,ZESTRIL) 5 MG tablet Take 1 tablet (5 mg total) by mouth daily. 30 tablet 1  . metoprolol (LOPRESSOR) 50 MG tablet Take 1 tablet (50 mg total) by mouth 2 (two) times daily. 60 tablet 0  . NITROSTAT 0.4 MG SL tablet Place 0.4 mg under the tongue every 5 (five) minutes as needed. Chest pain  0  . ranitidine (ZANTAC) 300 MG tablet take 1 tablet by mouth at bedtime (Patient taking differently: Take 300mg  by mouth at bedtime) 30 tablet 6  . Thiamine HCl (VITAMIN B-1) 100 MG tablet Take 100 mg by mouth daily.      . traMADol  (ULTRAM) 50 MG tablet Take 1 tablet (50 mg total) by mouth every 6 (six) hours as needed. 30 tablet 0  . warfarin (COUMADIN) 5 MG tablet Take as directed by coumadin clinic 30 tablet 3   No current facility-administered medications for this visit.    Allergies: Allergies  Allergen Reactions  . Norvasc [Amlodipine Besylate] Swelling       . Pletal [Cilostazol] Diarrhea  . Heparin Other (See Comments)    HIT positive as of 12/31/14    Social History: The patient  reports that he quit smoking about 9 years ago. His smoking use included Cigarettes. He  has never used smokeless tobacco. He reports that he drinks about 14.4 oz of alcohol per week. He reports that he does not use illicit drugs.   Family History: The patient's family history includes Coronary artery disease (age of onset: 93) in his father; Dementia in his mother; Heart disease in his brother and father; Hyperlipidemia in his brother and father; Hypertension in his brother and father; Other (age of onset: 55) in his mother; Peripheral vascular disease in his father.   Review of Systems: Please see the history of present illness.   Otherwise, the review of systems is positive for none.   All other systems are reviewed and negative.   Physical Exam: VS:  BP 110/70 mmHg  Pulse 91  Resp 18  Ht 5\' 11"  (1.803 m)  Wt 177 lb 8 oz (80.513 kg)  BMI 24.77 kg/m2  SpO2 92% .  BMI Body mass index is 24.77 kg/(m^2).  Wt Readings from Last 3 Encounters:  01/28/15 177 lb 8 oz (80.513 kg)  01/20/15 179 lb (81.194 kg)  01/13/15 179 lb (81.194 kg)    General: Pleasant. Chronically ill but looks stronger in no acute distress. Color still sallow. HEENT: Normal. Neck: Supple, no JVD, carotid bruits, or masses noted.  Cardiac: Regular rate and rhythm. No murmurs, rubs, or gallops. No real edema today. Vein harvesting sites slow to heal but not infected.  Respiratory:  Lungs are clear to auscultation bilaterally with normal work of  breathing.  GI: Soft and nontender.  MS: No deformity or atrophy. Gait and ROM intact. Using a walker.  Skin: Warm and dry. Color is sallow.  Neuro:  Strength and sensation are intact and no gross focal deficits noted.  Psych: Alert, appropriate and with normal affect.    LABORATORY DATA:  EKG:  EKG is not ordered today.   Lab Results  Component Value Date   WBC 11.0* 01/17/2015   HGB 9.9* 01/17/2015   HCT 30.1* 01/17/2015   PLT 394.0 01/17/2015   GLUCOSE 98 01/13/2015   CHOL 107 12/08/2014   TRIG 57 12/08/2014   HDL 55 12/08/2014   LDLCALC 41 12/08/2014   ALT 17 12/27/2014   AST 24 12/27/2014   NA 132* 01/13/2015   K 4.5 01/13/2015   CL 98 01/13/2015   CREATININE 1.01 01/13/2015   BUN 17 01/13/2015   CO2 23 01/13/2015   TSH 0.74 01/12/2013   INR 1.5 01/28/2015   HGBA1C 5.4 12/12/2014    BNP (last 3 results)  Recent Labs  12/27/14 1028  BNP 568.1*    ProBNP (last 3 results)  Recent Labs  11/08/14 1544  PROBNP 179.0*     Other Studies Reviewed Today:  Acute RLE DVT and PE -was on IV Heparin, this was transitioned to Xarelto on 8/8, due to drop in platelets -HIT panel positive, changed to Arixtra 8/10 -seen by hematology Dr.Shadad and has follow up with him later this month.  -started warfarin 8/11 -continue warfarin for at least 6 months   Low grade fever -could be related to acute PE Vs pulm infarcts -wife to continue to monitor his temperature  Anemia-post op blood loss anemia - Transfused 2 units PRBC with good response, stable - Rechecking labs today -May need to consider iron  Thrombocytopenia - Heparin induced clinically and HIt Ab panel positive - Serotonin release assay positive as well and hence HIT confirmed - heme consult per Dr.Shadad completed - Rechecking his labs today.    Acute renal failure/dehydration with  hyponatremia -rechecking lab today   Essential hypertension, benign - BP good on his current regimen.  Continue to monitor.    CAD/recent NSTEMI/S/P CABG x 5 (12/13/14) - Continue beta blocker, crestor   PAD  -with persistent right foot pain - most likely multifactorial - he does not feel strong enough to go to [facility] - does not feel like anything will be done regardless. He will continue to increase his activity and use Ultram prn. His wounds are healing all be it slowly.    AAA  -Very complex and due to degree of calcification has been deemed not electively operable -Followed by Dr. Sammuel Hines at [facility]  Current medicines are reviewed with the patient today.  The patient does not have concerns regarding medicines other than what has been noted above.  The following changes have been made:  See above.  Labs/ tests ordered today include:    Orders Placed This Encounter  Procedures  . Basic metabolic panel  . CBC     Disposition:   FU with me in 6 weeks.   Patient is agreeable to this plan and will call if any problems develop in the interim.   Signed: Burtis Junes, RN, ANP-C 01/28/2015 2:40 PM  [place] [address] [place], [place]  [address] Phone: [phone] Fax: [phone]

## 2015-01-28 NOTE — Patient Instructions (Addendum)
We will be checking the following labs today - BMET & CBC   Medication Instructions:    Continue with your current medicines.     Testing/Procedures To Be Arranged:  N/A  Follow-Up:   See me in 6 weeks    Other Special Instructions:   N/A  Call the [place] office at [phone] if you have any questions, problems or concerns.

## 2015-01-29 ENCOUNTER — Other Ambulatory Visit: Payer: Self-pay | Admitting: *Deleted

## 2015-01-29 DIAGNOSIS — I2510 Atherosclerotic heart disease of native coronary artery without angina pectoris: Secondary | ICD-10-CM | POA: Diagnosis not present

## 2015-01-29 DIAGNOSIS — J449 Chronic obstructive pulmonary disease, unspecified: Secondary | ICD-10-CM | POA: Diagnosis not present

## 2015-01-29 DIAGNOSIS — I214 Non-ST elevation (NSTEMI) myocardial infarction: Secondary | ICD-10-CM | POA: Diagnosis not present

## 2015-01-29 DIAGNOSIS — Z48812 Encounter for surgical aftercare following surgery on the circulatory system: Secondary | ICD-10-CM | POA: Diagnosis not present

## 2015-01-29 DIAGNOSIS — I739 Peripheral vascular disease, unspecified: Secondary | ICD-10-CM | POA: Diagnosis not present

## 2015-01-29 DIAGNOSIS — I10 Essential (primary) hypertension: Secondary | ICD-10-CM | POA: Diagnosis not present

## 2015-01-29 MED ORDER — FERROUS SULFATE 324 (65 FE) MG PO TBEC: 1.0000 | DELAYED_RELEASE_TABLET | Freq: Every day | ORAL | Status: DC

## 2015-01-29 MED ORDER — FOLIC ACID 1 MG PO TABS: 1.0000 mg | ORAL_TABLET | Freq: Every day | ORAL | Status: AC

## 2015-02-01 DIAGNOSIS — I2510 Atherosclerotic heart disease of native coronary artery without angina pectoris: Secondary | ICD-10-CM | POA: Diagnosis not present

## 2015-02-01 DIAGNOSIS — I214 Non-ST elevation (NSTEMI) myocardial infarction: Secondary | ICD-10-CM | POA: Diagnosis not present

## 2015-02-01 DIAGNOSIS — I10 Essential (primary) hypertension: Secondary | ICD-10-CM | POA: Diagnosis not present

## 2015-02-01 DIAGNOSIS — I739 Peripheral vascular disease, unspecified: Secondary | ICD-10-CM | POA: Diagnosis not present

## 2015-02-01 DIAGNOSIS — Z48812 Encounter for surgical aftercare following surgery on the circulatory system: Secondary | ICD-10-CM | POA: Diagnosis not present

## 2015-02-01 DIAGNOSIS — J449 Chronic obstructive pulmonary disease, unspecified: Secondary | ICD-10-CM | POA: Diagnosis not present

## 2015-02-03 ENCOUNTER — Ambulatory Visit (INDEPENDENT_AMBULATORY_CARE_PROVIDER_SITE_OTHER): Payer: Medicare Other | Admitting: *Deleted

## 2015-02-03 DIAGNOSIS — I214 Non-ST elevation (NSTEMI) myocardial infarction: Secondary | ICD-10-CM | POA: Diagnosis not present

## 2015-02-03 DIAGNOSIS — Z5181 Encounter for therapeutic drug level monitoring: Secondary | ICD-10-CM

## 2015-02-03 DIAGNOSIS — I2699 Other pulmonary embolism without acute cor pulmonale: Secondary | ICD-10-CM

## 2015-02-03 DIAGNOSIS — I82401 Acute embolism and thrombosis of unspecified deep veins of right lower extremity: Secondary | ICD-10-CM | POA: Diagnosis not present

## 2015-02-03 DIAGNOSIS — Z48812 Encounter for surgical aftercare following surgery on the circulatory system: Secondary | ICD-10-CM | POA: Diagnosis not present

## 2015-02-03 DIAGNOSIS — I739 Peripheral vascular disease, unspecified: Secondary | ICD-10-CM | POA: Diagnosis not present

## 2015-02-03 DIAGNOSIS — Z951 Presence of aortocoronary bypass graft: Secondary | ICD-10-CM | POA: Diagnosis not present

## 2015-02-03 DIAGNOSIS — I2510 Atherosclerotic heart disease of native coronary artery without angina pectoris: Secondary | ICD-10-CM | POA: Diagnosis not present

## 2015-02-03 DIAGNOSIS — J449 Chronic obstructive pulmonary disease, unspecified: Secondary | ICD-10-CM | POA: Diagnosis not present

## 2015-02-03 DIAGNOSIS — I10 Essential (primary) hypertension: Secondary | ICD-10-CM | POA: Diagnosis not present

## 2015-02-03 LAB — POCT INR: INR: 2.2

## 2015-02-05 ENCOUNTER — Other Ambulatory Visit: Payer: Self-pay

## 2015-02-05 DIAGNOSIS — I739 Peripheral vascular disease, unspecified: Secondary | ICD-10-CM | POA: Diagnosis not present

## 2015-02-05 DIAGNOSIS — I10 Essential (primary) hypertension: Secondary | ICD-10-CM | POA: Diagnosis not present

## 2015-02-05 DIAGNOSIS — I214 Non-ST elevation (NSTEMI) myocardial infarction: Secondary | ICD-10-CM | POA: Diagnosis not present

## 2015-02-05 DIAGNOSIS — I2510 Atherosclerotic heart disease of native coronary artery without angina pectoris: Secondary | ICD-10-CM | POA: Diagnosis not present

## 2015-02-05 DIAGNOSIS — J449 Chronic obstructive pulmonary disease, unspecified: Secondary | ICD-10-CM | POA: Diagnosis not present

## 2015-02-05 DIAGNOSIS — Z48812 Encounter for surgical aftercare following surgery on the circulatory system: Secondary | ICD-10-CM | POA: Diagnosis not present

## 2015-02-05 MED ORDER — LISINOPRIL 5 MG PO TABS: 5.0000 mg | ORAL_TABLET | Freq: Every day | ORAL | Status: DC

## 2015-02-06 ENCOUNTER — Other Ambulatory Visit: Payer: Medicare Other

## 2015-02-06 ENCOUNTER — Ambulatory Visit (HOSPITAL_BASED_OUTPATIENT_CLINIC_OR_DEPARTMENT_OTHER): Payer: Medicare Other | Admitting: Oncology

## 2015-02-06 VITALS — BP 129/65 | HR 71 | Temp 98.1°F | Resp 18 | Ht 71.0 in | Wt 175.0 lb

## 2015-02-06 DIAGNOSIS — D6959 Other secondary thrombocytopenia: Secondary | ICD-10-CM

## 2015-02-06 DIAGNOSIS — I2699 Other pulmonary embolism without acute cor pulmonale: Secondary | ICD-10-CM | POA: Diagnosis not present

## 2015-02-06 DIAGNOSIS — Z7901 Long term (current) use of anticoagulants: Secondary | ICD-10-CM | POA: Diagnosis not present

## 2015-02-06 NOTE — Progress Notes (Signed)
Hematology and Oncology Follow Up Visit  [name] [id number] 1950/10/29 64 y.o. 02/06/2015 11:01 AM [name], MDMitchell, BestLouis Sa, MD   Principle Diagnosis: 64 year old gentleman with heparin-induced thrombocytopenia diagnosed in August 2016. He presented with deep vein thrombosis and received heparin and platelet count dropped to 60,000. He subsequently developed a pulmonary embolism.   Current therapy: Warfarin started in August 2016. Heparin was withheld and platelet count recovered to normal range. He will require treatment for total 6 months.  Interim History:  [name] presents today for a follow-up visit. He is a pleasant gentleman I saw in consultation back on 01/01/2015. At that time he presented with a deep vein thrombosis and a pulmonary embolism in August 2016. He is status post CABG on 12/13/2014. He was noted to have a precipitous drop in his platelet count after he was started with heparin to treat his thrombosis. His platelet count dropped to 64 from 134 between 12/28/2014 and 12/31/2014. His HIT panel was positive confirming the diagnosis. After stopping heparin, his platelet count recovered to normal range. He is currently on warfarin and have tolerated it well. Has not reported any bleeding or thrombosis episode. Has not reported any chest pain or difficulty breathing. He still have delayed healing because of poor vascular supply.  He does not report any headaches, blurry vision, syncope or seizures. He is not report any fevers, chills or sweats. Does not report any chest pain, palpitation orthopnea. Does not report any cough or hemoptysis or wheezing. Does not report any nausea, vomiting or abdominal pain. Does not report any skeletal complaints. Remaining review of systems unremarkable.  Medications: I have reviewed the patient's current medications.  Current Outpatient Prescriptions  Medication Sig Dispense Refill  . acetaminophen (TYLENOL) 325 MG tablet Take 650  mg by mouth every 6 (six) hours as needed for mild pain or headache.    Marland Kitchen aspirin 81 MG tablet Take 81 mg by mouth every evening.     Marland Kitchen CRESTOR 20 MG tablet take 1 tablet by mouth once daily at bedtime (Patient taking differently: Take 20mg  by mouth at bedtime) 30 tablet 5  . ferrous sulfate 324 (65 FE) MG TBEC Take 1 tablet (325 mg total) by mouth daily. 30 tablet 0  . folic acid (FOLVITE) 1 MG tablet Take 1 tablet (1 mg total) by mouth daily. 30 tablet 0  . lisinopril (PRINIVIL,ZESTRIL) 5 MG tablet Take 1 tablet (5 mg total) by mouth daily. 30 tablet 6  . metoprolol (LOPRESSOR) 50 MG tablet Take 1 tablet (50 mg total) by mouth 2 (two) times daily. 60 tablet 0  . NITROSTAT 0.4 MG SL tablet Place 0.4 mg under the tongue every 5 (five) minutes as needed. Chest pain  0  . ranitidine (ZANTAC) 300 MG tablet take 1 tablet by mouth at bedtime (Patient taking differently: Take 300mg  by mouth at bedtime) 30 tablet 6  . Thiamine HCl (VITAMIN B-1) 100 MG tablet Take 100 mg by mouth daily.      . traMADol (ULTRAM) 50 MG tablet Take 1 tablet (50 mg total) by mouth every 6 (six) hours as needed. 30 tablet 0  . warfarin (COUMADIN) 5 MG tablet Take as directed by coumadin clinic 30 tablet 3   No current facility-administered medications for this visit.     Allergies:  Allergies  Allergen Reactions  . Norvasc [Amlodipine Besylate] Swelling       . Pletal [Cilostazol] Diarrhea  . Heparin Other (See Comments)    HIT  positive as of 12/31/14    Past Medical History, Surgical history, Social history, and Family History were reviewed and updated.  Physical Exam: Blood pressure 129/65, pulse 71, temperature 98.1 F (36.7 C), temperature source Oral, resp. rate 18, height 5\' 11"  (1.803 m), weight 175 lb (79.379 kg), SpO2 100 %. ECOG: 1 General appearance: alert and cooperative Head: Normocephalic, without obvious abnormality Neck: no adenopathy Lymph nodes: Cervical, supraclavicular, and axillary nodes  normal. Heart:regular rate and rhythm, S1, S2 normal, no murmur, click, rub or gallop Lung:chest clear, no wheezing, rales, normal symmetric air entry Abdomin: soft, non-tender, without masses or organomegaly EXT:no erythema, induration, or nodules   Lab Results: Lab Results  Component Value Date   WBC 9.6 01/28/2015   HGB 10.1* 01/28/2015   HCT 30.7* 01/28/2015   MCV 90.7 01/28/2015   PLT 295.0 01/28/2015     Chemistry      Component Value Date/Time   NA 130* 01/28/2015 1446   K 4.3 01/28/2015 1446   CL 95* 01/28/2015 1446   CO2 26 01/28/2015 1446   BUN 15 01/28/2015 1446   CREATININE 0.97 01/28/2015 1446      Component Value Date/Time   CALCIUM 9.2 01/28/2015 1446   ALKPHOS 68 12/27/2014 1028   AST 24 12/27/2014 1028   ALT 17 12/27/2014 1028   BILITOT 1.4* 12/27/2014 1028     Results for [name] (MRN [id number]) as of 02/06/2015 11:22  Ref. Range 12/31/2014 12:50  Heparin Induced Plt Ab Latest Ref Range: 0.000-0.400 OD 2.744 (H)  SRA .2 IU/mL UFH Ser-aCnc Latest Ref Range: 0-20 % 62 (H)  SRA 100IU/mL UFH Ser-aCnc Latest Ref Range: 0-20 % 1  SRA UFH Ser-Imp Unknown Comment    Impression and Plan:   64 year old gentleman with the following issues:  1. Heparin-induced thrombocytopenia diagnosed in August 2016. This diagnosis was suspected clinically with the platelet count dropped by more than 50% following a CABG operation. His heparin-induced platelet antibody was detected and was confirmed by a serotonin release assay. After stopping all operative products and bridge due to warfarin with Arixtra, his blood count is normal up to 295. No further intervention is needed from a platelet standpoint.  I have recommended to avoid ALL heparin products including heparin flushes. He should be labeled with heparin allergy.   2. Deep vein thrombosis with pulmonary embolism: This is a product of heparin-induced thrombocytopenia and possibly postoperative complication. He is  currently on warfarin and I have recommended to continue on it for a total of 6 months.   Unless there is a cardiac reason to maintain warfarin beyond 6 months, this can be stopped at that time.  3. Follow-up: Will be as needed a be happy to see him in the future for any questions.   [facility], MD 9/15/201611:01 AM

## 2015-02-07 ENCOUNTER — Other Ambulatory Visit: Payer: Self-pay | Admitting: *Deleted

## 2015-02-07 DIAGNOSIS — I2510 Atherosclerotic heart disease of native coronary artery without angina pectoris: Secondary | ICD-10-CM | POA: Diagnosis not present

## 2015-02-07 DIAGNOSIS — I739 Peripheral vascular disease, unspecified: Secondary | ICD-10-CM | POA: Diagnosis not present

## 2015-02-07 DIAGNOSIS — I10 Essential (primary) hypertension: Secondary | ICD-10-CM | POA: Diagnosis not present

## 2015-02-07 DIAGNOSIS — J449 Chronic obstructive pulmonary disease, unspecified: Secondary | ICD-10-CM | POA: Diagnosis not present

## 2015-02-07 DIAGNOSIS — Z48812 Encounter for surgical aftercare following surgery on the circulatory system: Secondary | ICD-10-CM | POA: Diagnosis not present

## 2015-02-07 DIAGNOSIS — I214 Non-ST elevation (NSTEMI) myocardial infarction: Secondary | ICD-10-CM | POA: Diagnosis not present

## 2015-02-07 MED ORDER — METOPROLOL TARTRATE 50 MG PO TABS: 50.0000 mg | ORAL_TABLET | Freq: Two times a day (BID) | ORAL | Status: DC

## 2015-02-10 ENCOUNTER — Encounter: Payer: Self-pay | Admitting: *Deleted

## 2015-02-10 ENCOUNTER — Ambulatory Visit (INDEPENDENT_AMBULATORY_CARE_PROVIDER_SITE_OTHER): Payer: Medicare Other | Admitting: Pharmacist

## 2015-02-10 DIAGNOSIS — I82401 Acute embolism and thrombosis of unspecified deep veins of right lower extremity: Secondary | ICD-10-CM

## 2015-02-10 DIAGNOSIS — Z5181 Encounter for therapeutic drug level monitoring: Secondary | ICD-10-CM

## 2015-02-10 DIAGNOSIS — Z951 Presence of aortocoronary bypass graft: Secondary | ICD-10-CM

## 2015-02-10 DIAGNOSIS — I2699 Other pulmonary embolism without acute cor pulmonale: Secondary | ICD-10-CM | POA: Diagnosis not present

## 2015-02-10 DIAGNOSIS — I214 Non-ST elevation (NSTEMI) myocardial infarction: Secondary | ICD-10-CM

## 2015-02-10 LAB — POCT INR: INR: 1.3

## 2015-02-11 ENCOUNTER — Other Ambulatory Visit: Payer: Self-pay | Admitting: Surgery

## 2015-02-11 DIAGNOSIS — I214 Non-ST elevation (NSTEMI) myocardial infarction: Secondary | ICD-10-CM | POA: Diagnosis not present

## 2015-02-11 DIAGNOSIS — J449 Chronic obstructive pulmonary disease, unspecified: Secondary | ICD-10-CM | POA: Diagnosis not present

## 2015-02-11 DIAGNOSIS — I2510 Atherosclerotic heart disease of native coronary artery without angina pectoris: Secondary | ICD-10-CM | POA: Diagnosis not present

## 2015-02-11 DIAGNOSIS — I10 Essential (primary) hypertension: Secondary | ICD-10-CM | POA: Diagnosis not present

## 2015-02-11 DIAGNOSIS — I739 Peripheral vascular disease, unspecified: Secondary | ICD-10-CM | POA: Diagnosis not present

## 2015-02-11 DIAGNOSIS — Z951 Presence of aortocoronary bypass graft: Secondary | ICD-10-CM

## 2015-02-11 DIAGNOSIS — Z48812 Encounter for surgical aftercare following surgery on the circulatory system: Secondary | ICD-10-CM | POA: Diagnosis not present

## 2015-02-12 ENCOUNTER — Encounter: Payer: Self-pay | Admitting: Surgery

## 2015-02-12 ENCOUNTER — Ambulatory Visit (INDEPENDENT_AMBULATORY_CARE_PROVIDER_SITE_OTHER): Payer: Self-pay | Admitting: Surgery

## 2015-02-12 ENCOUNTER — Ambulatory Visit
Admission: RE | Admit: 2015-02-12 | Discharge: 2015-02-12 | Disposition: A | Discharge status: Home / Self Care | Payer: Medicare Other | Source: Ambulatory Visit | Attending: Surgery | Admitting: Surgery

## 2015-02-12 VITALS — BP 111/69 | HR 70 | Resp 20 | Ht 71.0 in | Wt 175.0 lb

## 2015-02-12 DIAGNOSIS — Z951 Presence of aortocoronary bypass graft: Secondary | ICD-10-CM

## 2015-02-12 DIAGNOSIS — I2510 Atherosclerotic heart disease of native coronary artery without angina pectoris: Secondary | ICD-10-CM

## 2015-02-12 DIAGNOSIS — Z87891 Personal history of nicotine dependence: Secondary | ICD-10-CM | POA: Diagnosis not present

## 2015-02-12 DIAGNOSIS — J90 Pleural effusion, not elsewhere classified: Secondary | ICD-10-CM | POA: Diagnosis not present

## 2015-02-12 NOTE — Progress Notes (Signed)
HPI: Patient returns for routine postoperative follow-up having undergone CABG x 5  on 12/13/2014. The patient's early postoperative recovery while in the hospital was notable for a slow recovery. He was discharged but then readmitted to the hospitalist service with leg swelling and DVT. He was started on heparin and developed HIT and a pulmonary embolus. He was anticoagulated with Arixtra and transitioned to Coumadin. Since hospital discharge the patient reports that he has been feeling better. His stamina is not normal yet but he is walking. He is eating better and has lost about 30 lbs through this ordeal. His leg pain and swelling are resolved. He saw Dr. Alen Blew recently and he feels like 6 months of Coumadin should be adequate. .   Current Outpatient Prescriptions  Medication Sig Dispense Refill  . acetaminophen (TYLENOL) 325 MG tablet Take 650 mg by mouth every 6 (six) hours as needed for mild pain or headache.    Marland Kitchen aspirin 81 MG tablet Take 81 mg by mouth every evening.     Marland Kitchen CRESTOR 20 MG tablet take 1 tablet by mouth once daily at bedtime (Patient taking differently: Take 20mg  by mouth at bedtime) 30 tablet 5  . ferrous sulfate 324 (65 FE) MG TBEC Take 1 tablet (325 mg total) by mouth daily. 30 tablet 0  . folic acid (FOLVITE) 1 MG tablet Take 1 tablet (1 mg total) by mouth daily. 30 tablet 0  . lisinopril (PRINIVIL,ZESTRIL) 5 MG tablet Take 1 tablet (5 mg total) by mouth daily. 30 tablet 6  . metoprolol (LOPRESSOR) 50 MG tablet Take 1 tablet (50 mg total) by mouth 2 (two) times daily. 60 tablet 1  . NITROSTAT 0.4 MG SL tablet Place 0.4 mg under the tongue every 5 (five) minutes as needed. Chest pain  0  . ranitidine (ZANTAC) 300 MG tablet take 1 tablet by mouth at bedtime (Patient taking differently: Take 300mg  by mouth at bedtime) 30 tablet 6  . Thiamine HCl (VITAMIN B-1) 100 MG tablet Take 100 mg by mouth daily.      . traMADol (ULTRAM) 50 MG tablet Take 1 tablet (50 mg total)  by mouth every 6 (six) hours as needed. 30 tablet 0  . warfarin (COUMADIN) 5 MG tablet Take as directed by coumadin clinic 30 tablet 3   No current facility-administered medications for this visit.    Physical Exam:  BP 111/69 mmHg  Pulse 70  Resp 20  Ht 5\' 11"  (1.803 m)  Wt 175 lb (79.379 kg)  BMI 24.42 kg/m2  SpO2  He looks well. Lung exam is clear. Cardiac exam shows a regular rate and rhythm with normal heart sounds. Chest incision is healing well and sternum is stable. The leg incisions are healing well and there is no peripheral edema.    Diagnostic Tests:  CLINICAL DATA: History of CABG in July of 2016, followup, former smoking history  EXAM: CHEST 2 VIEW  COMPARISON: Chest x-ray of 01/02/2015  FINDINGS: The tiny pleural effusions have almost completely resolved with minimal blunting of the posterior costophrenic angles remaining. No infiltrate is seen. Mediastinal and hilar contours are unremarkable. Median sternotomy sutures are noted and mild cardiomegaly is stable.  IMPRESSION: Almost complete resolution of tiny pleural effusions.   Electronically Signed  By: Ivar Drape M.D.  On: 02/12/2015 11:19  Impression:  Overall I think he is doing well. I encouraged him to continue walking. He is planning to participate in cardiac rehab. I told him he  could drive his car but should not lift anything heavier than 10 lbs for three months postop.   Plan:  He will continue to follow up with cardiology and Dr. Alroy Dust and will contact me if he develops any problems with his incisions.   Gaye Pollack, MD [facility] [phone]

## 2015-02-13 DIAGNOSIS — Z48812 Encounter for surgical aftercare following surgery on the circulatory system: Secondary | ICD-10-CM | POA: Diagnosis not present

## 2015-02-13 DIAGNOSIS — I214 Non-ST elevation (NSTEMI) myocardial infarction: Secondary | ICD-10-CM | POA: Diagnosis not present

## 2015-02-13 DIAGNOSIS — J449 Chronic obstructive pulmonary disease, unspecified: Secondary | ICD-10-CM | POA: Diagnosis not present

## 2015-02-13 DIAGNOSIS — I739 Peripheral vascular disease, unspecified: Secondary | ICD-10-CM | POA: Diagnosis not present

## 2015-02-13 DIAGNOSIS — I10 Essential (primary) hypertension: Secondary | ICD-10-CM | POA: Diagnosis not present

## 2015-02-13 DIAGNOSIS — I2510 Atherosclerotic heart disease of native coronary artery without angina pectoris: Secondary | ICD-10-CM | POA: Diagnosis not present

## 2015-02-16 ENCOUNTER — Other Ambulatory Visit: Payer: Self-pay | Admitting: Cardiovascular Disease

## 2015-02-17 ENCOUNTER — Other Ambulatory Visit: Payer: Self-pay | Admitting: Cardiovascular Disease

## 2015-02-17 ENCOUNTER — Ambulatory Visit (INDEPENDENT_AMBULATORY_CARE_PROVIDER_SITE_OTHER): Payer: Medicare Other | Admitting: *Deleted

## 2015-02-17 ENCOUNTER — Other Ambulatory Visit: Payer: Self-pay | Admitting: Physician Assistant

## 2015-02-17 DIAGNOSIS — I82401 Acute embolism and thrombosis of unspecified deep veins of right lower extremity: Secondary | ICD-10-CM | POA: Diagnosis not present

## 2015-02-17 DIAGNOSIS — I2699 Other pulmonary embolism without acute cor pulmonale: Secondary | ICD-10-CM

## 2015-02-17 DIAGNOSIS — Z5181 Encounter for therapeutic drug level monitoring: Secondary | ICD-10-CM | POA: Diagnosis not present

## 2015-02-17 DIAGNOSIS — Z951 Presence of aortocoronary bypass graft: Secondary | ICD-10-CM | POA: Diagnosis not present

## 2015-02-17 DIAGNOSIS — I214 Non-ST elevation (NSTEMI) myocardial infarction: Secondary | ICD-10-CM

## 2015-02-17 LAB — POCT INR: INR: 1.8

## 2015-02-17 NOTE — Telephone Encounter (Signed)
Burtis Junes, NP at 01/28/2015 2:01 PM  Essential hypertension, benign - BP good on his current regimen. Continue to monitor CRESTOR 20 MG tablettake 1 tablet by mouth once daily at bedtime

## 2015-02-18 DIAGNOSIS — Z48812 Encounter for surgical aftercare following surgery on the circulatory system: Secondary | ICD-10-CM | POA: Diagnosis not present

## 2015-02-18 DIAGNOSIS — I214 Non-ST elevation (NSTEMI) myocardial infarction: Secondary | ICD-10-CM | POA: Diagnosis not present

## 2015-02-18 DIAGNOSIS — I739 Peripheral vascular disease, unspecified: Secondary | ICD-10-CM | POA: Diagnosis not present

## 2015-02-18 DIAGNOSIS — J449 Chronic obstructive pulmonary disease, unspecified: Secondary | ICD-10-CM | POA: Diagnosis not present

## 2015-02-18 DIAGNOSIS — I2510 Atherosclerotic heart disease of native coronary artery without angina pectoris: Secondary | ICD-10-CM | POA: Diagnosis not present

## 2015-02-18 DIAGNOSIS — I10 Essential (primary) hypertension: Secondary | ICD-10-CM | POA: Diagnosis not present

## 2015-02-19 ENCOUNTER — Ambulatory Visit: Payer: Medicare Other | Admitting: Surgery

## 2015-02-19 DIAGNOSIS — I739 Peripheral vascular disease, unspecified: Secondary | ICD-10-CM | POA: Diagnosis not present

## 2015-02-19 DIAGNOSIS — I2510 Atherosclerotic heart disease of native coronary artery without angina pectoris: Secondary | ICD-10-CM | POA: Diagnosis not present

## 2015-02-19 DIAGNOSIS — I10 Essential (primary) hypertension: Secondary | ICD-10-CM | POA: Diagnosis not present

## 2015-02-19 DIAGNOSIS — J449 Chronic obstructive pulmonary disease, unspecified: Secondary | ICD-10-CM | POA: Diagnosis not present

## 2015-02-19 DIAGNOSIS — I214 Non-ST elevation (NSTEMI) myocardial infarction: Secondary | ICD-10-CM | POA: Diagnosis not present

## 2015-02-19 DIAGNOSIS — Z48812 Encounter for surgical aftercare following surgery on the circulatory system: Secondary | ICD-10-CM | POA: Diagnosis not present

## 2015-02-27 DIAGNOSIS — I6522 Occlusion and stenosis of left carotid artery: Secondary | ICD-10-CM | POA: Diagnosis not present

## 2015-02-27 DIAGNOSIS — I771 Stricture of artery: Secondary | ICD-10-CM | POA: Diagnosis not present

## 2015-02-27 DIAGNOSIS — I714 Abdominal aortic aneurysm, without rupture: Secondary | ICD-10-CM | POA: Diagnosis not present

## 2015-02-27 DIAGNOSIS — I745 Embolism and thrombosis of iliac artery: Secondary | ICD-10-CM | POA: Diagnosis not present

## 2015-02-28 DIAGNOSIS — Z23 Encounter for immunization: Secondary | ICD-10-CM | POA: Diagnosis not present

## 2015-03-03 ENCOUNTER — Ambulatory Visit (INDEPENDENT_AMBULATORY_CARE_PROVIDER_SITE_OTHER): Payer: Medicare Other | Admitting: *Deleted

## 2015-03-03 DIAGNOSIS — Z951 Presence of aortocoronary bypass graft: Secondary | ICD-10-CM

## 2015-03-03 DIAGNOSIS — I214 Non-ST elevation (NSTEMI) myocardial infarction: Secondary | ICD-10-CM

## 2015-03-03 DIAGNOSIS — I82401 Acute embolism and thrombosis of unspecified deep veins of right lower extremity: Secondary | ICD-10-CM

## 2015-03-03 DIAGNOSIS — Z5181 Encounter for therapeutic drug level monitoring: Secondary | ICD-10-CM | POA: Diagnosis not present

## 2015-03-03 DIAGNOSIS — I2699 Other pulmonary embolism without acute cor pulmonale: Secondary | ICD-10-CM | POA: Diagnosis not present

## 2015-03-03 LAB — POCT INR: INR: 2.3

## 2015-03-12 ENCOUNTER — Ambulatory Visit: Payer: Medicare Other | Admitting: Nurse Practitioner

## 2015-03-14 DIAGNOSIS — N3000 Acute cystitis without hematuria: Secondary | ICD-10-CM | POA: Diagnosis not present

## 2015-03-14 DIAGNOSIS — R3911 Hesitancy of micturition: Secondary | ICD-10-CM | POA: Diagnosis not present

## 2015-03-18 ENCOUNTER — Ambulatory Visit (INDEPENDENT_AMBULATORY_CARE_PROVIDER_SITE_OTHER): Payer: Medicare Other | Admitting: Pharmacist Clinician (PhC)/ Clinical Pharmacy Specialist

## 2015-03-18 ENCOUNTER — Encounter: Payer: Self-pay | Admitting: Nurse Practitioner

## 2015-03-18 ENCOUNTER — Ambulatory Visit (INDEPENDENT_AMBULATORY_CARE_PROVIDER_SITE_OTHER): Payer: Medicare Other | Admitting: Nurse Practitioner

## 2015-03-18 VITALS — BP 142/80 | HR 65 | Ht 71.0 in | Wt 178.0 lb

## 2015-03-18 DIAGNOSIS — I82401 Acute embolism and thrombosis of unspecified deep veins of right lower extremity: Secondary | ICD-10-CM | POA: Diagnosis not present

## 2015-03-18 DIAGNOSIS — D649 Anemia, unspecified: Secondary | ICD-10-CM

## 2015-03-18 DIAGNOSIS — I10 Essential (primary) hypertension: Secondary | ICD-10-CM

## 2015-03-18 DIAGNOSIS — Z951 Presence of aortocoronary bypass graft: Secondary | ICD-10-CM

## 2015-03-18 DIAGNOSIS — I2699 Other pulmonary embolism without acute cor pulmonale: Secondary | ICD-10-CM

## 2015-03-18 DIAGNOSIS — Z5181 Encounter for therapeutic drug level monitoring: Secondary | ICD-10-CM

## 2015-03-18 DIAGNOSIS — I2510 Atherosclerotic heart disease of native coronary artery without angina pectoris: Secondary | ICD-10-CM

## 2015-03-18 DIAGNOSIS — I739 Peripheral vascular disease, unspecified: Secondary | ICD-10-CM | POA: Diagnosis not present

## 2015-03-18 DIAGNOSIS — E785 Hyperlipidemia, unspecified: Secondary | ICD-10-CM

## 2015-03-18 DIAGNOSIS — I214 Non-ST elevation (NSTEMI) myocardial infarction: Secondary | ICD-10-CM

## 2015-03-18 DIAGNOSIS — I82409 Acute embolism and thrombosis of unspecified deep veins of unspecified lower extremity: Secondary | ICD-10-CM | POA: Diagnosis not present

## 2015-03-18 LAB — CBC
HCT: 30.5 % — ABNORMAL LOW (ref 39.0–52.0)
Hemoglobin: 10.2 g/dL — ABNORMAL LOW (ref 13.0–17.0)
MCH: 29.8 pg (ref 26.0–34.0)
MCHC: 33.4 g/dL (ref 30.0–36.0)
MCV: 89.2 fL (ref 78.0–100.0)
MPV: 8.5 fL — ABNORMAL LOW (ref 8.6–12.4)
Platelets: 303 10*3/uL (ref 150–400)
RBC: 3.42 MIL/uL — ABNORMAL LOW (ref 4.22–5.81)
RDW: 17.9 % — ABNORMAL HIGH (ref 11.5–15.5)
WBC: 8.3 10*3/uL (ref 4.0–10.5)

## 2015-03-18 LAB — BASIC METABOLIC PANEL
BUN: 13 mg/dL (ref 7–25)
CO2: 24 mmol/L (ref 20–31)
Calcium: 9 mg/dL (ref 8.6–10.3)
Chloride: 96 mmol/L — ABNORMAL LOW (ref 98–110)
Creat: 0.95 mg/dL (ref 0.70–1.25)
Glucose, Bld: 95 mg/dL (ref 65–99)
Potassium: 4.5 mmol/L (ref 3.5–5.3)
Sodium: 128 mmol/L — ABNORMAL LOW (ref 135–146)

## 2015-03-18 LAB — POCT INR: INR: 1.7

## 2015-03-18 NOTE — Progress Notes (Signed)
CARDIOLOGY OFFICE NOTE  Date:  03/18/2015    [name] Date of Birth: 1951-03-28 Medical Record [id number]  PCP:  [name] Coffin, MD  Cardiologist:  [name]    Chief Complaint  Patient presents with  . Coronary Artery Disease    6 week check - seen for Dr. Burt Best    History of Present Illness: [name] is a 64 y.o. male who presents today for a 6 week check. Seen for Dr. Burt Best. Former patient of Dr. Susa Best. He has an extensive history of PVD and CAD. He was previously followed by [facility]. Bridgett [facility] at [facility] but now seeing Vascular in [facility]. Prior cath in 2006 showing normal LV function with a totally occluded RCA with left to right collaterals. He has distal small vessel disease in the RCA with diffuse mild to moderate stenosis in the LAD. No longer smoking. His PVD history is quite extensive and includes AAA (with no plans to intervene due to the nature of his aneurysm and his multiple comorbidities).   Other problems include HTN, HLD and OA. He has a history of hyponatremia as well.  I saw him earlier this summer - he was doing ok.  But then presented back in July with chest pain/NSTEMI and underwent CABG x 5 by Dr. Cyndia Best. He had vein harvesting from the right lower extremity. He then presented with progressive right lower extremity swelling and discomfort. He followed up with the cardiac surgery office on 8/2 and was started on Lasix and potassium for bilateral lower extremity edema.  He fell at home and he was on the floor for at least 2 hours before his wife found him and she brought him back to the ER.   He was found to have acute RLE DVT and PE. Was on IV heparin, transitioned to Xarelto due to drop in platelets, HIT panel + and serotonin release assay + as well and he was changed to Arixtra and seen by Dr. Alen Best. Warfarin started and he is to be on 6 months of therapy. He did require transfusion of 2 units PRBCs.   I have seen him several times post op - little  puny and slow to improve.  Was still having considerable right leg pain - I put him on Ultram. Plavix stopped.   Last seen in early September - he looked better but still with considerable pain in the right foot - most likely multifactorial - arterial/recent DVT/venous insufficiency. Was encouraged to see Dr. Sammuel Best by TCTS but this is a pretty big ordeal to go to [facility] and [place] felt that since there "was nothing to do then not to go". He was a little depressed. Has stopped his alcohol intake.   He comes in today. Here with his wife. Looks much better today - back to his baseline. Feels good. He has been to [facility] - earlier this month - looks like his aneurysm has grown - they were told about the size - Dr. Sammuel Best said he would see him back in 6 months and "to live his life". Did not go to cardiac rehab - says this was cancelled by Dr. Burt Best - not documented in [facility] that I can see. He has been released from hematology - to stop coumadin after 6 months of therapy. They are wanting to plan a trip. Said PCP was telling him to refrain from [place]. No swelling in his legs. Right foot much better. Not taking Ultram. Overall, they are both pretty happy  with how he has progressed.   Past Medical History  Diagnosis Date  . Hyperlipidemia   . Coronary artery disease   . AAA (abdominal aortic aneurysm) ([place])   . PAD (peripheral artery disease) ([place])   . COPD (chronic obstructive pulmonary disease) ([place])   . Raynaud's disease /phenomenon   . Lumbar degenerative disc disease   . Stroke ([place]) 10/15/2005  . PONV (postoperative nausea and vomiting)   . Hypertension     takes meds daily  . Anxiety   . GERD (gastroesophageal reflux disease)   . History of blood transfusion   . Hyponatremia   . Myocardial infarction ([place])   . CHF (congestive heart failure) [facility])     Past Surgical History  Procedure Laterality Date  . Iliac artery stent  10/15/2005     Left common and external   . Cardiac catheterization  2006    . Carotid endarterectomy      bilateral  . Carotid endarterectomy  04/13/2005    Right and 06/03/2005 Left  . Abdominal angiogram  03/30/2012  . Hernia repair    . Abdominal aortagram N/A 03/30/2012    Procedure: ABDOMINAL Maxcine Ham;  Surgeon: Conrad Monteagle, MD;  Location: [facility] CATH LAB;  Service: Cardiovascular;  Laterality: N/A;  . Cardiac catheterization N/A 12/09/2014    Procedure: Left Heart Cath and Coronary Angiography;  Surgeon: [name], MD;  Location: [facility] CV LAB;  Service: Cardiovascular;  Laterality: N/A;  . Coronary artery bypass graft N/A 12/13/2014    Procedure: CORONARY ARTERY BYPASS GRAFT times five using left internal mammary artery and endoscopic right leg saphenous vein harvest;  Surgeon: Gaye Pollack, MD;  Location: [place] OR;  Service: Open Heart Surgery;  Laterality: N/A;  . Intraoperative transesophageal echocardiogram N/A 12/13/2014    Procedure: INTRAOPERATIVE TRANSESOPHAGEAL ECHOCARDIOGRAM;  Surgeon: Gaye Pollack, MD;  Location: [facility] OR;  Service: Open Heart Surgery;  Laterality: N/A;     Medications: Current Outpatient Prescriptions  Medication Sig Dispense Refill  . acetaminophen (TYLENOL) 325 MG tablet Take 650 mg by mouth every 6 (six) hours as needed for mild pain or headache.    Marland Kitchen aspirin 81 MG tablet Take 81 mg by mouth every evening.     . ciprofloxacin (CIPRO) 250 MG tablet Take 250 mg by mouth 2 (two) times daily.  0  . CRESTOR 20 MG tablet take 1 tablet by mouth once daily at bedtime 90 tablet 1  . ferrous sulfate 324 (65 FE) MG TBEC Take 1 tablet (325 mg total) by mouth daily. 30 tablet 0  . folic acid (FOLVITE) 1 MG tablet Take 1 tablet (1 mg total) by mouth daily. 30 tablet 0  . lisinopril (PRINIVIL,ZESTRIL) 5 MG tablet take 1 tablet by mouth once daily 30 tablet 1  . metoprolol (LOPRESSOR) 50 MG tablet Take 1 tablet (50 mg total) by mouth 2 (two) times daily. 60 tablet 1  . NITROSTAT 0.4 MG SL tablet Place 0.4 mg under the tongue every 5  (five) minutes as needed. Chest pain  0  . ranitidine (ZANTAC) 300 MG tablet take 1 tablet by mouth at bedtime (Patient taking differently: Take 300mg  by mouth at bedtime) 30 tablet 6  . Thiamine HCl (VITAMIN B-1) 100 MG tablet Take 100 mg by mouth daily.      . traMADol (ULTRAM) 50 MG tablet Take 1 tablet (50 mg total) by mouth every 6 (six) hours as needed. 30 tablet 0  . warfarin (COUMADIN) 5 MG  tablet Take as directed by coumadin clinic 30 tablet 3   No current facility-administered medications for this visit.    Allergies: Allergies  Allergen Reactions  . Norvasc [Amlodipine Besylate] Swelling       . Pletal [Cilostazol] Diarrhea  . Heparin Other (See Comments)    HIT positive as of 12/31/14    Social History: The patient  reports that he quit smoking about 9 years ago. His smoking use included Cigarettes. He has never used smokeless tobacco. He reports that he drinks about 14.4 oz of alcohol per week. He reports that he does not use illicit drugs.   Family History: The patient's family history includes Coronary artery disease (age of onset: 57) in his father; Dementia in his mother; Heart disease in his brother and father; Hyperlipidemia in his brother and father; Hypertension in his brother and father; Other (age of onset: 56) in his mother; Peripheral vascular disease in his father.   Review of Systems: Please see the history of present illness.   Otherwise, the review of systems is positive for current UTI.   All other systems are reviewed and negative.   Physical Exam: VS:  BP 142/80 mmHg  Pulse 65  Ht 5\' 11"  (1.803 m)  Wt 178 lb (80.74 kg)  BMI 24.84 kg/m2  SpO2 100% .  BMI Body mass index is 24.84 kg/(m^2).  Wt Readings from Last 3 Encounters:  03/18/15 178 lb (80.74 kg)  02/12/15 175 lb (79.379 kg)  02/06/15 175 lb (79.379 kg)   BP is 120/80 by me.  General: Pleasant. Looks much stronger today. He is in no acute distress.  HEENT: Normal. Neck: Supple, no JVD,  carotid bruits, or masses noted.  Cardiac: Regular rate and rhythm. No murmurs, rubs, or gallops. No edema.  Respiratory:  Lungs are clear to auscultation bilaterally with normal work of breathing.  GI: Soft and nontender.  MS: No deformity or atrophy. Gait and ROM intact. Skin: Warm and dry. Color is normal.  Neuro:  Strength and sensation are intact and no gross focal deficits noted.  Psych: Alert, appropriate and with normal affect.   LABORATORY DATA:  EKG:  EKG is not ordered today.  Lab Results  Component Value Date   WBC 9.6 01/28/2015   HGB 10.1* 01/28/2015   HCT 30.7* 01/28/2015   PLT 295.0 01/28/2015   GLUCOSE 92 01/28/2015   CHOL 107 12/08/2014   TRIG 57 12/08/2014   HDL 55 12/08/2014   LDLCALC 41 12/08/2014   ALT 17 12/27/2014   AST 24 12/27/2014   NA 130* 01/28/2015   K 4.3 01/28/2015   CL 95* 01/28/2015   CREATININE 0.97 01/28/2015   BUN 15 01/28/2015   CO2 26 01/28/2015   TSH 0.74 01/12/2013   INR 1.7 03/18/2015   HGBA1C 5.4 12/12/2014    BNP (last 3 results)  Recent Labs  12/27/14 1028  BNP 568.1*    ProBNP (last 3 results)  Recent Labs  11/08/14 1544  PROBNP 179.0*     Other Studies Reviewed Today:  From [facility] Carotid Duplex: results pending CTA c/a/p: Stable juxtarenal AAA measuring 5.8 x 5.4. Stable left common iliac aneurysm. Patent left common iliac stent. Moderate to severe stenosis of the left common carotid and right subclavian arteries. These images were reviewed by Dr. Sammuel Best.    Assessment/Plan:  Acute RLE DVT and PE -was on IV Heparin, this was transitioned to Xarelto on 8/8, due to drop in platelets -HIT panel positive,  changed to Arixtra 8/10 -followed by hematology - Dr.Shadad - seen last month and has released him.  -started warfarin 01/02/15 -continue warfarin for at least 6 months -I think he is ok to travel - use precautions (hydration, compression hose, mobilizing during trip, etc)   Low grade  fever -resolved  Anemia-post op blood loss anemia - has stopped his iron due to side effects - recheck labs today  Thrombocytopenia - Heparin induced clinically and HIt Ab panel positive - Serotonin release assay positive as well and hence HIT confirmed - Heparin is listed as an allergy for him   Acute renal failure/dehydration with hyponatremia -rechecking lab today   Essential hypertension, benign - BP good on his current regimen. Recheck by me is fine. No change with his current regimen   CAD/recent NSTEMI/S/P CABG x 5 (12/13/14) - Continue beta blocker, crestor. No longer interested in cardiac rehab.    PAD  -with prior right foot pain - most likely multifactorial - much better - no longer taking pain medicines.    AAA  -Very complex and due to degree of calcification has been deemed not electively operable -Followed by Dr. Sammuel Best at [facility] - seen earlier this month - note reviewed.   Current medicines are reviewed with the patient today.  The patient does not have concerns regarding medicines other than what has been noted above.  The following changes have been made:  See above.  Labs/ tests ordered today include:    Orders Placed This Encounter  Procedures  . Basic metabolic panel  . CBC     Disposition:   FU with me in 3 months with fasting labs.   Patient is agreeable to this plan and will call if any problems develop in the interim.   Signed: Burtis Junes, RN, ANP-C 03/18/2015 9:05 AM  [facility] [address] [place], [place]  [address] Phone: [phone] Fax: [phone]

## 2015-03-18 NOTE — Patient Instructions (Addendum)
We will be checking the following labs today - BMET, CBC   Medication Instructions:    Continue with your current medicines.     Testing/Procedures To Be Arranged:  N/A  Follow-Up:   See me in 3 months with fasting labs.   Cancel next month's OV with me    Other Special Instructions:   N/A  Call the [place] office at [phone] if you have any questions, problems or concerns.

## 2015-03-19 ENCOUNTER — Other Ambulatory Visit: Payer: Self-pay | Admitting: *Deleted

## 2015-03-19 DIAGNOSIS — E871 Hypo-osmolality and hyponatremia: Secondary | ICD-10-CM

## 2015-03-31 ENCOUNTER — Ambulatory Visit (INDEPENDENT_AMBULATORY_CARE_PROVIDER_SITE_OTHER): Payer: Medicare Other | Admitting: *Deleted

## 2015-03-31 DIAGNOSIS — I82401 Acute embolism and thrombosis of unspecified deep veins of right lower extremity: Secondary | ICD-10-CM | POA: Diagnosis not present

## 2015-03-31 DIAGNOSIS — I2699 Other pulmonary embolism without acute cor pulmonale: Secondary | ICD-10-CM

## 2015-03-31 DIAGNOSIS — Z5181 Encounter for therapeutic drug level monitoring: Secondary | ICD-10-CM

## 2015-03-31 DIAGNOSIS — Z951 Presence of aortocoronary bypass graft: Secondary | ICD-10-CM

## 2015-03-31 DIAGNOSIS — I214 Non-ST elevation (NSTEMI) myocardial infarction: Secondary | ICD-10-CM

## 2015-03-31 LAB — POCT INR: INR: 1.5

## 2015-04-02 ENCOUNTER — Ambulatory Visit: Payer: Medicare Other | Admitting: Nurse Practitioner

## 2015-04-02 ENCOUNTER — Other Ambulatory Visit: Payer: Self-pay | Admitting: Cardiovascular Disease

## 2015-04-08 ENCOUNTER — Ambulatory Visit: Payer: Medicare Other | Admitting: Nurse Practitioner

## 2015-04-15 ENCOUNTER — Ambulatory Visit (INDEPENDENT_AMBULATORY_CARE_PROVIDER_SITE_OTHER): Payer: Medicare Other | Admitting: *Deleted

## 2015-04-15 ENCOUNTER — Other Ambulatory Visit (INDEPENDENT_AMBULATORY_CARE_PROVIDER_SITE_OTHER): Payer: Medicare Other | Admitting: *Deleted

## 2015-04-15 DIAGNOSIS — I82401 Acute embolism and thrombosis of unspecified deep veins of right lower extremity: Secondary | ICD-10-CM | POA: Diagnosis not present

## 2015-04-15 DIAGNOSIS — E871 Hypo-osmolality and hyponatremia: Secondary | ICD-10-CM

## 2015-04-15 DIAGNOSIS — I2699 Other pulmonary embolism without acute cor pulmonale: Secondary | ICD-10-CM | POA: Diagnosis not present

## 2015-04-15 DIAGNOSIS — Z951 Presence of aortocoronary bypass graft: Secondary | ICD-10-CM

## 2015-04-15 DIAGNOSIS — Z5181 Encounter for therapeutic drug level monitoring: Secondary | ICD-10-CM

## 2015-04-15 DIAGNOSIS — I214 Non-ST elevation (NSTEMI) myocardial infarction: Secondary | ICD-10-CM

## 2015-04-15 LAB — BASIC METABOLIC PANEL
BUN: 12 mg/dL (ref 7–25)
CO2: 26 mmol/L (ref 20–31)
Calcium: 8.9 mg/dL (ref 8.6–10.3)
Chloride: 98 mmol/L (ref 98–110)
Creat: 0.81 mg/dL (ref 0.70–1.25)
Glucose, Bld: 92 mg/dL (ref 65–99)
Potassium: 4.5 mmol/L (ref 3.5–5.3)
Sodium: 133 mmol/L — ABNORMAL LOW (ref 135–146)

## 2015-04-15 LAB — POCT INR: INR: 2.3

## 2015-04-19 ENCOUNTER — Other Ambulatory Visit: Payer: Self-pay | Admitting: Cardiovascular Disease

## 2015-04-24 ENCOUNTER — Other Ambulatory Visit: Payer: Self-pay | Admitting: Cardiovascular Disease

## 2015-04-29 ENCOUNTER — Ambulatory Visit (INDEPENDENT_AMBULATORY_CARE_PROVIDER_SITE_OTHER): Payer: Medicare Other | Admitting: *Deleted

## 2015-04-29 DIAGNOSIS — I2699 Other pulmonary embolism without acute cor pulmonale: Secondary | ICD-10-CM | POA: Diagnosis not present

## 2015-04-29 DIAGNOSIS — I214 Non-ST elevation (NSTEMI) myocardial infarction: Secondary | ICD-10-CM

## 2015-04-29 DIAGNOSIS — Z951 Presence of aortocoronary bypass graft: Secondary | ICD-10-CM

## 2015-04-29 DIAGNOSIS — Z5181 Encounter for therapeutic drug level monitoring: Secondary | ICD-10-CM | POA: Diagnosis not present

## 2015-04-29 DIAGNOSIS — I82401 Acute embolism and thrombosis of unspecified deep veins of right lower extremity: Secondary | ICD-10-CM | POA: Diagnosis not present

## 2015-04-29 LAB — POCT INR: INR: 1.6

## 2015-05-09 ENCOUNTER — Ambulatory Visit (INDEPENDENT_AMBULATORY_CARE_PROVIDER_SITE_OTHER): Payer: Medicare Other | Admitting: *Deleted

## 2015-05-09 DIAGNOSIS — Z951 Presence of aortocoronary bypass graft: Secondary | ICD-10-CM

## 2015-05-09 DIAGNOSIS — Z5181 Encounter for therapeutic drug level monitoring: Secondary | ICD-10-CM

## 2015-05-09 DIAGNOSIS — I214 Non-ST elevation (NSTEMI) myocardial infarction: Secondary | ICD-10-CM

## 2015-05-09 DIAGNOSIS — I2699 Other pulmonary embolism without acute cor pulmonale: Secondary | ICD-10-CM | POA: Diagnosis not present

## 2015-05-09 DIAGNOSIS — I82401 Acute embolism and thrombosis of unspecified deep veins of right lower extremity: Secondary | ICD-10-CM

## 2015-05-09 LAB — POCT INR: INR: 1.7

## 2015-05-21 ENCOUNTER — Ambulatory Visit (INDEPENDENT_AMBULATORY_CARE_PROVIDER_SITE_OTHER): Payer: Medicare Other | Admitting: *Deleted

## 2015-05-21 DIAGNOSIS — I2699 Other pulmonary embolism without acute cor pulmonale: Secondary | ICD-10-CM | POA: Diagnosis not present

## 2015-05-21 DIAGNOSIS — Z5181 Encounter for therapeutic drug level monitoring: Secondary | ICD-10-CM | POA: Diagnosis not present

## 2015-05-21 DIAGNOSIS — I214 Non-ST elevation (NSTEMI) myocardial infarction: Secondary | ICD-10-CM

## 2015-05-21 DIAGNOSIS — I82401 Acute embolism and thrombosis of unspecified deep veins of right lower extremity: Secondary | ICD-10-CM

## 2015-05-21 DIAGNOSIS — Z951 Presence of aortocoronary bypass graft: Secondary | ICD-10-CM

## 2015-05-21 LAB — POCT INR: INR: 2.5

## 2015-06-11 ENCOUNTER — Encounter: Payer: Self-pay | Admitting: Nurse Practitioner

## 2015-06-11 ENCOUNTER — Ambulatory Visit (INDEPENDENT_AMBULATORY_CARE_PROVIDER_SITE_OTHER): Payer: Medicare Other | Admitting: Nurse Practitioner

## 2015-06-11 ENCOUNTER — Ambulatory Visit (INDEPENDENT_AMBULATORY_CARE_PROVIDER_SITE_OTHER): Payer: Medicare Other | Admitting: *Deleted

## 2015-06-11 VITALS — BP 132/70 | HR 76 | Ht 71.0 in | Wt 192.8 lb

## 2015-06-11 DIAGNOSIS — Z5181 Encounter for therapeutic drug level monitoring: Secondary | ICD-10-CM

## 2015-06-11 DIAGNOSIS — Z951 Presence of aortocoronary bypass graft: Secondary | ICD-10-CM

## 2015-06-11 DIAGNOSIS — Z7901 Long term (current) use of anticoagulants: Secondary | ICD-10-CM | POA: Diagnosis not present

## 2015-06-11 DIAGNOSIS — I2699 Other pulmonary embolism without acute cor pulmonale: Secondary | ICD-10-CM | POA: Diagnosis not present

## 2015-06-11 DIAGNOSIS — I214 Non-ST elevation (NSTEMI) myocardial infarction: Secondary | ICD-10-CM

## 2015-06-11 DIAGNOSIS — I10 Essential (primary) hypertension: Secondary | ICD-10-CM

## 2015-06-11 DIAGNOSIS — T7840XS Allergy, unspecified, sequela: Secondary | ICD-10-CM | POA: Diagnosis not present

## 2015-06-11 DIAGNOSIS — I82401 Acute embolism and thrombosis of unspecified deep veins of right lower extremity: Secondary | ICD-10-CM | POA: Diagnosis not present

## 2015-06-11 DIAGNOSIS — L509 Urticaria, unspecified: Secondary | ICD-10-CM | POA: Diagnosis not present

## 2015-06-11 DIAGNOSIS — I25810 Atherosclerosis of coronary artery bypass graft(s) without angina pectoris: Secondary | ICD-10-CM | POA: Diagnosis not present

## 2015-06-11 LAB — CBC
HCT: 37 % — ABNORMAL LOW (ref 39.0–52.0)
Hemoglobin: 12.9 g/dL — ABNORMAL LOW (ref 13.0–17.0)
MCH: 33.1 pg (ref 26.0–34.0)
MCHC: 34.9 g/dL (ref 30.0–36.0)
MCV: 94.9 fL (ref 78.0–100.0)
MPV: 8.9 fL (ref 8.6–12.4)
Platelets: 260 10*3/uL (ref 150–400)
RBC: 3.9 MIL/uL — ABNORMAL LOW (ref 4.22–5.81)
RDW: 15.6 % — ABNORMAL HIGH (ref 11.5–15.5)
WBC: 8.2 10*3/uL (ref 4.0–10.5)

## 2015-06-11 LAB — HEPATIC FUNCTION PANEL
ALT: 9 U/L (ref 9–46)
AST: 17 U/L (ref 10–35)
Albumin: 3.8 g/dL (ref 3.6–5.1)
Alkaline Phosphatase: 80 U/L (ref 40–115)
Bilirubin, Direct: 0.2 mg/dL (ref <=0.2)
Indirect Bilirubin: 0.7 mg/dL (ref 0.2–1.2)
Total Bilirubin: 0.9 mg/dL (ref 0.2–1.2)
Total Protein: 7 g/dL (ref 6.1–8.1)

## 2015-06-11 LAB — BASIC METABOLIC PANEL
BUN: 16 mg/dL (ref 7–25)
CO2: 26 mmol/L (ref 20–31)
Calcium: 9.4 mg/dL (ref 8.6–10.3)
Chloride: 97 mmol/L — ABNORMAL LOW (ref 98–110)
Creat: 0.92 mg/dL (ref 0.70–1.25)
Glucose, Bld: 100 mg/dL — ABNORMAL HIGH (ref 65–99)
Potassium: 5 mmol/L (ref 3.5–5.3)
Sodium: 131 mmol/L — ABNORMAL LOW (ref 135–146)

## 2015-06-11 LAB — LIPID PANEL
Cholesterol: 135 mg/dL (ref 125–200)
HDL: 69 mg/dL (ref >=40)
LDL Cholesterol: 56 mg/dL (ref <130)
Total CHOL/HDL Ratio: 2 Ratio (ref <=5.0)
Triglycerides: 52 mg/dL (ref <150)
VLDL: 10 mg/dL (ref <30)

## 2015-06-11 LAB — POCT INR: INR: 2.7

## 2015-06-11 MED ORDER — CLOPIDOGREL BISULFATE 75 MG PO TABS: 75.0000 mg | ORAL_TABLET | Freq: Every day | ORAL | Status: DC

## 2015-06-11 NOTE — Progress Notes (Signed)
CARDIOLOGY OFFICE NOTE  Date:  06/11/2015    [name] Date of Birth: 1951/04/23 Medical Record #[id number]  PCP:  [name] Coffin, MD  Cardiologist:  [name]    Chief Complaint  Patient presents with  . Coronary Artery Disease  . PAD  . AAA    Follow up visit - seen for Dr. Burt Best    History of Present Illness: [name] is a 65 y.o. male who presents today for a follow up visit. Seen for Dr. Burt Best. Former patient of Dr. Susa Best. He has an extensive history of PVD and CAD. He was previously followed by Dr. Bridgett [facility] at [facility] but now seeing Vascular in [facility] with Dr. Sammuel Best. Prior cath in 2006 showing normal LV function with a totally occluded RCA with left to right collaterals. He has distal small vessel disease in the RCA with diffuse mild to moderate stenosis in the LAD. Former smoker. His PVD history is quite extensive and includes AAA (with no plans to intervene due to the nature of his aneurysm and his multiple comorbidities).   Other problems include HTN, HLD and OA. He has a history of hyponatremia as well.  He presented back in July of 2016 with chest pain/NSTEMI and underwent CABG x 5 by Dr. Cyndia Best. He had vein harvesting from the right lower extremity. He then presented with progressive right lower extremity swelling and discomfort. He followed up with the cardiac surgery office on 8/2 and was started on Lasix and potassium for bilateral lower extremity edema. He fell at home and he was on the floor for at least 2 hours before his wife found him and she brought him back to the ER.   He was found to have acute RLE DVT and PE. Was on IV heparin, transitioned to Xarelto due to drop in platelets, HIT panel + and serotonin release assay + as well and he was changed to Arixtra and seen by [name]. Warfarin started and he is to be on 6 months of therapy. He did require transfusion of 2 units PRBCs.   I have seen him several times post op - little puny and slow to  improve. Was still having considerable right leg pain - I put him on Ultram. Plavix stopped.   I saw him in early September - he looked better but still with considerable pain in the right foot - most likely multifactorial - arterial/recent DVT/venous insufficiency. Was encouraged to see Dr. Sammuel Best by TCTS but this is a pretty big ordeal to go to [facility] and [place] felt that since there "was nothing to do then not to go". He was a little depressed. Had stopped his alcohol intake.   At his follow up visit in October - he was much better - really back to his baseline. Had been to [facility] - aneurysm had grown - but still no plan to intervene. Was told to "go live his life". Anxious to get back to some traveling within the [place]. He had been released from hematology - to stop coumadin after total 6 months of therapy.   He comes in today. Here with his wife. Only has a few more weeks of Coumadin - asking about whether to transition back to Plavix afterwards. Eating very good.  He has gained weight. Wife feels like he is walking more. Has some type of allergic reaction - itchy - all over - including his eyes. Seeing PCP later today. No chest pain. Not short of breath.  They are traveling to [facility] on February 10th for one week. He has not taken his medicines yet today.   Past Medical History  Diagnosis Date  . Hyperlipidemia   . Coronary artery disease   . AAA (abdominal aortic aneurysm) ([place])   . PAD (peripheral artery disease) ([place])   . COPD (chronic obstructive pulmonary disease) ([place])   . Raynaud's disease /phenomenon   . Lumbar degenerative disc disease   . Stroke ([place]) 10/15/2005  . PONV (postoperative nausea and vomiting)   . Hypertension     takes meds daily  . Anxiety   . GERD (gastroesophageal reflux disease)   . History of blood transfusion   . Hyponatremia   . Myocardial infarction ([place])   . CHF (congestive heart failure) [facility])     Past Surgical History  Procedure Laterality Date  . Iliac  artery stent  10/15/2005     Left common and external   . Cardiac catheterization  2006  . Carotid endarterectomy      bilateral  . Carotid endarterectomy  04/13/2005    Right and 06/03/2005 Left  . Abdominal angiogram  03/30/2012  . Hernia repair    . Abdominal aortagram N/A 03/30/2012    Procedure: ABDOMINAL Maxcine Ham;  Surgeon: Conrad Artesia, MD;  Location: [facility] CATH LAB;  Service: Cardiovascular;  Laterality: N/A;  . Cardiac catheterization N/A 12/09/2014    Procedure: Left Heart Cath and Coronary Angiography;  Surgeon: [name], MD;  Location: [place] CV LAB;  Service: Cardiovascular;  Laterality: N/A;  . Coronary artery bypass graft N/A 12/13/2014    Procedure: CORONARY ARTERY BYPASS GRAFT times five using left internal mammary artery and endoscopic right leg saphenous vein harvest;  Surgeon: Gaye Pollack, MD;  Location: [place] OR;  Service: Open Heart Surgery;  Laterality: N/A;  . Intraoperative transesophageal echocardiogram N/A 12/13/2014    Procedure: INTRAOPERATIVE TRANSESOPHAGEAL ECHOCARDIOGRAM;  Surgeon: Gaye Pollack, MD;  Location: [facility] OR;  Service: Open Heart Surgery;  Laterality: N/A;     Medications: Current Outpatient Prescriptions  Medication Sig Dispense Refill  . acetaminophen (TYLENOL) 325 MG tablet Take 650 mg by mouth every 6 (six) hours as needed for mild pain or headache.    Marland Kitchen aspirin 81 MG tablet Take 81 mg by mouth every evening.     Marland Kitchen CRESTOR 20 MG tablet take 1 tablet by mouth once daily at bedtime 90 tablet 1  . folic acid (FOLVITE) 1 MG tablet Take 1 tablet (1 mg total) by mouth daily. 30 tablet 0  . lisinopril (PRINIVIL,ZESTRIL) 5 MG tablet take 1 tablet by mouth once daily 30 tablet 10  . metoprolol (LOPRESSOR) 50 MG tablet take 1 tablet by mouth twice a day 60 tablet 10  . NITROSTAT 0.4 MG SL tablet Place 0.4 mg under the tongue every 5 (five) minutes as needed. Chest pain  0  . ranitidine (ZANTAC) 300 MG tablet take 1 tablet by mouth once daily at  bedtime 30 tablet 10  . Thiamine HCl (VITAMIN B-1) 100 MG tablet Take 100 mg by mouth daily.      . traMADol (ULTRAM) 50 MG tablet Take 1 tablet (50 mg total) by mouth every 6 (six) hours as needed. 30 tablet 0  . warfarin (COUMADIN) 5 MG tablet Take as directed by coumadin clinic 30 tablet 3  . clopidogrel (PLAVIX) 75 MG tablet Take 1 tablet (75 mg total) by mouth daily. 90 tablet 3   No current facility-administered medications for  this visit.    Allergies: Allergies  Allergen Reactions  . Norvasc [Amlodipine Besylate] Swelling       . Pletal [Cilostazol] Diarrhea  . Heparin Other (See Comments)    HIT positive as of 12/31/14    Social History: The patient  reports that he quit smoking about 10 years ago. His smoking use included Cigarettes. He has never used smokeless tobacco. He reports that he drinks about 14.4 oz of alcohol per week. He reports that he does not use illicit drugs.   Family History: The patient's family history includes Coronary artery disease (age of onset: 78) in his father; Dementia in his mother; Heart disease in his brother and father; Hyperlipidemia in his brother and father; Hypertension in his brother and father; Other (age of onset: 58) in his mother; Peripheral vascular disease in his father.   Review of Systems: Please see the history of present illness.   Otherwise, the review of systems is positive for none.   All other systems are reviewed and negative.   Physical Exam: VS:  BP 132/70 mmHg  Pulse 76  Ht _0  (1.803 m)  Wt 192 lb 12.8 oz (87.454 kg)  BMI 26.90 kg/m2 .  BMI Body mass index is 26.9 kg/(m^2).  Wt Readings from Last 3 Encounters:  06/11/15 192 lb 12.8 oz (87.454 kg)  03/18/15 178 lb (80.74 kg)  02/12/15 175 lb (79.379 kg)    General: Pleasant. His weight has climbed 14 pounds.  HEENT: Normal but his eyes are swollen and erythematous. Neck: Supple, no JVD, carotid bruits, or masses noted.  Cardiac: Regular rate and rhythm. No  murmurs, rubs, or gallops. No edema.  Respiratory:  Lungs are clear to auscultation bilaterally with normal work of breathing.  GI: Soft and nontender.  MS: No deformity or atrophy. Gait and ROM intact. Skin: Warm and dry. Color is normal.  Neuro:  Strength and sensation are intact and no gross focal deficits noted.  Psych: Alert, appropriate and with normal affect.   LABORATORY DATA:  EKG:  EKG is not ordered today.  Lab Results  Component Value Date   WBC 8.3 03/18/2015   HGB 10.2* 03/18/2015   HCT 30.5* 03/18/2015   PLT 303 03/18/2015   GLUCOSE 92 04/15/2015   CHOL 107 12/08/2014   TRIG 57 12/08/2014   HDL 55 12/08/2014   LDLCALC 41 12/08/2014   ALT 17 12/27/2014   AST 24 12/27/2014   NA 133* 04/15/2015   K 4.5 04/15/2015   CL 98 04/15/2015   CREATININE 0.81 04/15/2015   BUN 12 04/15/2015   CO2 26 04/15/2015   TSH 0.74 01/12/2013   INR 2.5 05/21/2015   HGBA1C 5.4 12/12/2014    BNP (last 3 results)  Recent Labs  12/27/14 1028  BNP 568.1*    ProBNP (last 3 results)  Recent Labs  11/08/14 1544  PROBNP 179.0*     Other Studies Reviewed Today:   Assessment/Plan:  Acute RLE DVT and PE -was on IV Heparin, this was transitioned to Xarelto on 8/8, due to drop in platelets -HIT panel positive, changed to Arixtra 8/10 -followed by hematology - LouisShadad - he has released him.  -started warfarin 01/02/15 -continue warfarin for at least 6 months - he is traveling to [place] in early February - I have elected to leave him on Coumadin until March 1st - then stop - restart Plavix 75 mg daily on March 1st.   Anemia-post op blood loss anemia - had previously  stopped his iron due to side effects - last check showing slow improvement - recheck labs today  Thrombocytopenia - Heparin induced clinically and HIT Ab panel positive - Serotonin release assay positive as well and hence HIT confirmed - Heparin is listed as an allergy for him   Acute renal  failure/dehydration with hyponatremia -rechecking lab today   Essential hypertension, benign - BP good on his current regimen. Recheck by me is fine. No change with his current regimen   CAD/recent NSTEMI/S/P CABG x 5 (12/13/14) - Continue beta blocker, crestor. No longer interested in cardiac rehab. Will restart his Plavix on 07/23/2015   PAD  -with prior right foot pain - most likely multifactorial - much better - no longer taking pain medicines.    AAA  -Very complex and due to degree of calcification has been deemed not electively operable -Followed by Dr. Sammuel Best at [place] - uncertain etiology - looks like an allergic reaction - seeing PCP later today. Checking sed rate with his labs today.   Current medicines are reviewed with the patient today.  The patient does not have concerns regarding medicines other than what has been noted above.  The following changes have been made:  See above.  Labs/ tests ordered today include:    Orders Placed This Encounter  Procedures  . Basic metabolic panel  . CBC  . Hepatic function panel  . Lipid panel  . Sed Rate (ESR)     Disposition:   FU with me in 4 months.   Patient is agreeable to this plan and will call if any problems develop in the interim.   Signed: Burtis Junes, RN, ANP-C 06/11/2015 10:30 AM  [facility] [address] [place], [place]  [address] Phone: [phone] Fax: [phone]

## 2015-06-11 NOTE — Patient Instructions (Addendum)
We will be checking the following labs today - CBC, BMET, sed rate, HPF and lipids   Medication Instructions:    Continue with your current medicines.   Ok to stop Coumadin on March 1st - would restart Plavix at 75 mg a day and continue aspirin    Testing/Procedures To Be Arranged:  N/A  Follow-Up:   See me in 4 months    Other Special Instructions:   Don't gain more weight!!!    If you need a refill on your cardiac medications before your next appointment, please call your pharmacy.   Call the [place] office at [phone] if you have any questions, problems or concerns.

## 2015-06-12 LAB — SEDIMENTATION RATE: Sed Rate: 20 mm/hr (ref 0–20)

## 2015-06-13 NOTE — Progress Notes (Signed)
Noted  

## 2015-06-14 ENCOUNTER — Other Ambulatory Visit: Payer: Self-pay | Admitting: Cardiovascular Disease

## 2015-06-25 DIAGNOSIS — L509 Urticaria, unspecified: Secondary | ICD-10-CM | POA: Diagnosis not present

## 2015-07-14 ENCOUNTER — Ambulatory Visit (INDEPENDENT_AMBULATORY_CARE_PROVIDER_SITE_OTHER): Payer: Medicare Other | Admitting: *Deleted

## 2015-07-14 DIAGNOSIS — I82401 Acute embolism and thrombosis of unspecified deep veins of right lower extremity: Secondary | ICD-10-CM | POA: Diagnosis not present

## 2015-07-14 DIAGNOSIS — I2699 Other pulmonary embolism without acute cor pulmonale: Secondary | ICD-10-CM | POA: Diagnosis not present

## 2015-07-14 DIAGNOSIS — Z951 Presence of aortocoronary bypass graft: Secondary | ICD-10-CM | POA: Diagnosis not present

## 2015-07-14 DIAGNOSIS — Z5181 Encounter for therapeutic drug level monitoring: Secondary | ICD-10-CM | POA: Diagnosis not present

## 2015-07-14 DIAGNOSIS — I214 Non-ST elevation (NSTEMI) myocardial infarction: Secondary | ICD-10-CM

## 2015-07-14 LAB — POCT INR: INR: 1.8

## 2015-07-22 ENCOUNTER — Encounter: Payer: Self-pay | Admitting: Allergy and Immunology

## 2015-07-22 ENCOUNTER — Ambulatory Visit (INDEPENDENT_AMBULATORY_CARE_PROVIDER_SITE_OTHER): Payer: Medicare Other | Admitting: Allergy and Immunology

## 2015-07-22 VITALS — BP 110/70 | HR 80 | Temp 97.3°F | Resp 18 | Ht 69.29 in | Wt 204.8 lb

## 2015-07-22 DIAGNOSIS — T783XXA Angioneurotic edema, initial encounter: Secondary | ICD-10-CM | POA: Diagnosis not present

## 2015-07-22 MED ORDER — LEVOCETIRIZINE DIHYDROCHLORIDE 5 MG PO TABS: ORAL_TABLET | ORAL | Status: DC

## 2015-07-22 NOTE — Patient Instructions (Addendum)
Angioedema ACE inhibitor-induced angioedema is always a consideration in individuals presenting with angioedema while on this class of medication, however [name] describes associated pruritus which is typically absent with ACE-induced angioedema.  Therefore, we will not recommend discontinuing lisinopril while other etiologies are pursued.  We were unable to perform skin tests today due to recent administration of high dose steroid.   [name] is scheduled to return in the next 2 weeks for allergy skin testing after having been off of prednisone for at least 1 week.  Further recommendations will be made at that time based upon skin test results.  For symptom relief, instructions have been provided and discussed for H1/H2 receptor blockade with titration to find lowest effective dose.  Should significant symptoms or new symptoms occur, a journal is to be kept recording any foods eaten, beverages consumed, medications taken, activities performed, and environmental conditions within a 6 hour time period prior to the onset of symptoms. For any symptoms concerning for anaphylaxis, 911 is to be called immediately.    Return in about 2 weeks (around 08/05/2015) for allergy skin testing.   Urticaria (Hives) and/or angioedema (swelling)  . Levocetirizine (Xyzal) 5 mg in morning and Cetirizine (Zyrtec) 10mg  at night and ranitidine (Zantac) 150 mg twice a day. If no symptoms for 7-14 days then decrease to. . Levocetirizine (Xyzal) 5 mg in morning and Cetirizine (Zyrtec) 10mg  at night and ranitidine (Zantac) 150 mg once a day.  If no symptoms for 7-14 days then decrease to. . Levocetirizine (Xyzal) 5 mg in morning and Cetirizine (Zyrtec) 10mg  at night.  If no symptoms for 7-14 days then decrease to. . Levocetirizine (Xyzal) 5 mg once a day.  May use Benadryl (diphenhydramine) as needed for breakthrough symptoms       If symptoms return, then step up dosage

## 2015-07-22 NOTE — Assessment & Plan Note (Signed)
ACE inhibitor-induced angioedema is always a consideration in individuals presenting with angioedema while on this class of medication, however Hashir describes associated pruritus which is typically absent with ACE-induced angioedema.  Therefore, we will not recommend discontinuing lisinopril while other etiologies are pursued.  We were unable to perform skin tests today due to recent administration of high dose steroid.   Iker is scheduled to return in the next 2 weeks for allergy skin testing after having been off of prednisone for at least 1 week.  Further recommendations will be made at that time based upon skin test results.  For symptom relief, instructions have been provided and discussed for H1/H2 receptor blockade with titration to find lowest effective dose.  Should significant symptoms or new symptoms occur, a journal is to be kept recording any foods eaten, beverages consumed, medications taken, activities performed, and environmental conditions within a 6 hour time period prior to the onset of symptoms. For any symptoms concerning for anaphylaxis, 911 is to be called immediately.

## 2015-07-22 NOTE — Progress Notes (Signed)
New Patient Note  RE: [name] MRN: [id number] DOB: Dec 18, 1950 Date of Office Visit: 07/22/2015  Referring provider: Alroy Dust, L.Marlou Sa, MD Primary care provider: Donnie Coffin, MD  Chief Complaint: Pruritis; Angioedema; and Rash   History of present illness: HPI Comments: [name] is a 65 y.o. male who presents today for consultation of angioedema.  He is accompanied by his wife who assists with the history.  In November 2016, he and his wife were traveling to [facility] and while on this trip he developed angioedema of the eyelids and generalized pruritus.  He did not experience concomitant artery of pulmonary or GI symptoms.  He was started on systemic steroids and the symptoms quickly resolved.  While in [place] in mid-February he again developed generalized pruritus and angioedema of the eyelids.  He was started on high-dose prednisone with taper and his symptoms quickly resolved.  No specific medication, food, or environmental trigger has been identified.  He had been on 10 mg of lisinopril for several years and over this past year the dose was decreased to 5 mg daily.   Assessment and plan: Angioedema ACE inhibitor-induced angioedema is always a consideration in individuals presenting with angioedema while on this class of medication, however [name] describes associated pruritus which is typically absent with ACE-induced angioedema.  Therefore, we will not recommend discontinuing lisinopril while other etiologies are pursued.  We were unable to perform skin tests today due to recent administration of high dose steroid.   [name] is scheduled to return in the next 2 weeks for allergy skin testing after having been off of prednisone for at least 1 week.  Further recommendations will be made at that time based upon skin test results.  For symptom relief, instructions have been provided and discussed for H1/H2 receptor blockade with titration to find lowest effective  dose.  Should significant symptoms or new symptoms occur, a journal is to be kept recording any foods eaten, beverages consumed, medications taken, activities performed, and environmental conditions within a 6 hour time period prior to the onset of symptoms. For any symptoms concerning for anaphylaxis, 911 is to be called immediately.     Meds ordered this encounter  Medications  . levocetirizine (XYZAL) 5 MG tablet    Sig: TAKE ONE TABLET ONCE DAILY AS DIRECTED    Dispense:  30 tablet    Refill:  5    Diagnositics: We were unable to perform skin tests today due to menstruation of high-dose prednisone.     Physical examination: Blood pressure 110/70, pulse 80, temperature 97.3 F (36.3 C), temperature source Oral, resp. rate 18, height 5' 9.29" (1.76 m), weight 204 lb 12.9 oz (92.9 kg).  General: Alert, interactive, in no acute distress. Lungs: Clear to auscultation without wheezing, rhonchi or rales. CV: Normal S1, S2 without murmurs. Abdomen: Nondistended, nontender. Skin: Warm and dry, without lesions or rashes. Extremities:  No clubbing, cyanosis or edema. Neuro:   Grossly intact.  Review of systems:  Review of Systems  Constitutional: Negative for fever, chills and weight loss.  HENT: Negative for nosebleeds.   Eyes: Negative for blurred vision.  Respiratory: Negative for hemoptysis, shortness of breath and wheezing.   Cardiovascular: Negative for chest pain.  Gastrointestinal: Negative for diarrhea and constipation.  Genitourinary: Negative for dysuria.  Musculoskeletal: Negative for myalgias and joint pain.  Skin: Positive for itching.  Neurological: Negative for dizziness.  Endo/Heme/Allergies: Does not bruise/bleed easily.    Past medical history:  Past  Medical History  Diagnosis Date  . Hyperlipidemia   . Coronary artery disease   . AAA (abdominal aortic aneurysm) ([place])   . PAD (peripheral artery disease) ([place])   . COPD (chronic obstructive pulmonary  disease) ([place])   . Raynaud's disease /phenomenon   . Lumbar degenerative disc disease   . Stroke ([place]) 10/15/2005  . PONV (postoperative nausea and vomiting)   . Hypertension     takes meds daily  . Anxiety   . GERD (gastroesophageal reflux disease)   . History of blood transfusion   . Hyponatremia   . Myocardial infarction ([place])   . CHF (congestive heart failure) ([facility])     Past surgical history:  Past Surgical History  Procedure Laterality Date  . Iliac artery stent  10/15/2005     Left common and external   . Cardiac catheterization  2006  . Carotid endarterectomy      bilateral  . Carotid endarterectomy  04/13/2005    Right and 06/03/2005 Left  . Abdominal angiogram  03/30/2012  . Hernia repair    . Abdominal aortagram N/A 03/30/2012    Procedure: ABDOMINAL Maxcine Ham;  Surgeon: Conrad , MD;  Location: [facility] CATH LAB;  Service: Cardiovascular;  Laterality: N/A;  . Cardiac catheterization N/A 12/09/2014    Procedure: Left Heart Cath and Coronary Angiography;  Surgeon: [name], MD;  Location: [facility] CV LAB;  Service: Cardiovascular;  Laterality: N/A;  . Coronary artery bypass graft N/A 12/13/2014    Procedure: CORONARY ARTERY BYPASS GRAFT times five using left internal mammary artery and endoscopic right leg saphenous vein harvest;  Surgeon: Gaye Pollack, MD;  Location: [place] OR;  Service: Open Heart Surgery;  Laterality: N/A;  . Intraoperative transesophageal echocardiogram N/A 12/13/2014    Procedure: INTRAOPERATIVE TRANSESOPHAGEAL ECHOCARDIOGRAM;  Surgeon: Gaye Pollack, MD;  Location: [facility] OR;  Service: Open Heart Surgery;  Laterality: N/A;    Family history: Family History  Problem Relation Age of Onset  . Coronary artery disease Father 75    HTN, Hyperlipidemia died  . Heart disease Father     Heart Disease before age 20  . Hypertension Father   . Hyperlipidemia Father   . Peripheral vascular disease Father   . Other Mother 45     died rheumatoid arthritis    . Dementia Mother   . Heart disease Brother   . Hypertension Brother   . Hyperlipidemia Brother     Social history: Social History   Social History  . Marital Status: Married    Spouse Name: N/A  . Number of Children: N/A  . Years of Education: N/A   Occupational History  . MANAGER. owns [employer]    Social History Main Topics  . Smoking status: Former Smoker    Types: Cigarettes    Quit date: 05/24/2005  . Smokeless tobacco: Never Used  . Alcohol Use: 14.4 oz/week    24 Cans of beer per week     Comment: approximately 24 beer a week  . Drug Use: No  . Sexual Activity: Not on file   Other Topics Concern  . Not on file   Social History Narrative   Environmental History: The patient lives in a 67 year old house with carpeting in the bedroom, gassy, and central air.  There is a dog in house which has access to his bedroom.  He is a former cigarette smoker.    Medication List       This list is accurate as of:  07/22/15  7:54 PM.  Always use your most recent med list.               acetaminophen 325 MG tablet  Commonly known as:  TYLENOL  Take 650 mg by mouth every 6 (six) hours as needed for mild pain or headache. Reported on 07/22/2015     aspirin 81 MG tablet  Take 81 mg by mouth every evening.     clopidogrel 75 MG tablet  Commonly known as:  PLAVIX  Take 1 tablet (75 mg total) by mouth daily.     CRESTOR 20 MG tablet  Generic drug:  rosuvastatin  take 1 tablet by mouth once daily at bedtime     folic acid 1 MG tablet  Commonly known as:  FOLVITE  Take 1 tablet (1 mg total) by mouth daily.     levocetirizine 5 MG tablet  Commonly known as:  XYZAL  TAKE ONE TABLET ONCE DAILY AS DIRECTED     lisinopril 5 MG tablet  Commonly known as:  PRINIVIL,ZESTRIL  take 1 tablet by mouth once daily     metoprolol 50 MG tablet  Commonly known as:  LOPRESSOR  take 1 tablet by mouth twice a day     NITROSTAT 0.4 MG SL tablet  Generic drug:  nitroGLYCERIN   Place 0.4 mg under the tongue every 5 (five) minutes as needed. Chest pain     predniSONE 20 MG tablet  Commonly known as:  DELTASONE  take 1 tablet three times a day for 3 days twice a day for 3 days 1 tablet daily for 3 days     ranitidine 300 MG tablet  Commonly known as:  ZANTAC  take 1 tablet by mouth once daily at bedtime     thiamine 100 MG tablet  Commonly known as:  VITAMIN B-1  Take 100 mg by mouth daily.     traMADol 50 MG tablet  Commonly known as:  ULTRAM  Take 1 tablet (50 mg total) by mouth every 6 (six) hours as needed.     warfarin 5 MG tablet  Commonly known as:  COUMADIN  take as directed BY COUMADIN CLINIC        Known medication allergies: Allergies  Allergen Reactions  . Norvasc [Amlodipine Besylate] Swelling       . Pletal [Cilostazol] Diarrhea  . 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 Please do not hesitate to contact me with questions.  Sincerely,   R. Edgar Frisk, MD

## 2015-07-24 DIAGNOSIS — M545 Low back pain: Secondary | ICD-10-CM | POA: Diagnosis not present

## 2015-07-24 DIAGNOSIS — M25551 Pain in right hip: Secondary | ICD-10-CM | POA: Diagnosis not present

## 2015-07-28 ENCOUNTER — Encounter: Payer: Self-pay | Admitting: *Deleted

## 2015-07-29 ENCOUNTER — Encounter: Payer: Self-pay | Admitting: Nurse Practitioner

## 2015-07-31 DIAGNOSIS — M545 Low back pain: Secondary | ICD-10-CM | POA: Diagnosis not present

## 2015-08-04 DIAGNOSIS — M545 Low back pain: Secondary | ICD-10-CM | POA: Diagnosis not present

## 2015-08-05 ENCOUNTER — Ambulatory Visit: Payer: Medicare Other | Admitting: Allergy and Immunology

## 2015-08-05 ENCOUNTER — Ambulatory Visit (INDEPENDENT_AMBULATORY_CARE_PROVIDER_SITE_OTHER): Payer: Medicare Other | Admitting: Allergy and Immunology

## 2015-08-05 ENCOUNTER — Encounter: Payer: Self-pay | Admitting: Allergy and Immunology

## 2015-08-05 VITALS — BP 110/68 | HR 100 | Resp 18

## 2015-08-05 DIAGNOSIS — R5383 Other fatigue: Secondary | ICD-10-CM | POA: Diagnosis not present

## 2015-08-05 DIAGNOSIS — L500 Allergic urticaria: Secondary | ICD-10-CM | POA: Insufficient documentation

## 2015-08-05 DIAGNOSIS — I25810 Atherosclerosis of coronary artery bypass graft(s) without angina pectoris: Secondary | ICD-10-CM | POA: Diagnosis not present

## 2015-08-05 DIAGNOSIS — K2970 Gastritis, unspecified, without bleeding: Secondary | ICD-10-CM | POA: Diagnosis not present

## 2015-08-05 DIAGNOSIS — L501 Idiopathic urticaria: Secondary | ICD-10-CM | POA: Diagnosis not present

## 2015-08-05 LAB — SEDIMENTATION RATE: Sed Rate: 24 mm/hr — ABNORMAL HIGH (ref 0–20)

## 2015-08-05 MED ORDER — TRIAMCINOLONE ACETONIDE 0.1 % EX OINT
1.0000 | TOPICAL_OINTMENT | Freq: Two times a day (BID) | CUTANEOUS | Status: AC
Start: 2015-08-05 — End: ?

## 2015-08-05 NOTE — Patient Instructions (Addendum)
Urticaria with associated angioedema Unclear etiology. Skin tests to select food allergens were negative today. NSAIDs and emotional stress commonly exacerbate urticaria but are not the underlying etiology in this case. Physical urticarias are negative by history (i.e. pressure-induced, temperature, vibration, solar, etc.). History and lesions are not consistent with urticaria pigmentosa so I am not suspicious for mastocytosis. There are no concomitant symptoms concerning for anaphylaxis or constitutional symptoms worrisome for an underlying malignancy. We will rule out other potential etiologies with labs. For symptom relief, patient is to take oral antihistamines as directed.  The following labs have been ordered: FCeRI antibody, TSH, anti-thyroglobulin antibody, thyroid peroxidase antibody, tryptase, urea breath test, ESR, ANA, and galactose-alpha-1,3-galactose IgE level.  The patient will be called with further recommendations after lab results have returned.  Restart/continue H1/H2 receptor blockade with titration to find lowest effective dose.  A journal is to be kept recording any foods eaten, beverages consumed, medications taken within a 6 hour period prior to the onset of significant or new symptoms, as well as record activities being performed, and environmental conditions. For any symptoms concerning for anaphylaxis, 911 is to be called immediately.    When lab results have returned the patient will be called with further recommendations and follow up instructions.

## 2015-08-05 NOTE — Assessment & Plan Note (Addendum)
Unclear etiology. Skin tests to select food allergens were negative today. NSAIDs and emotional stress commonly exacerbate urticaria but are not the underlying etiology in this case. Physical urticarias are negative by history (i.e. pressure-induced, temperature, vibration, solar, etc.). History and lesions are not consistent with urticaria pigmentosa so I am not suspicious for mastocytosis. There are no concomitant symptoms concerning for anaphylaxis or constitutional symptoms worrisome for an underlying malignancy. We will rule out other potential etiologies with labs. For symptom relief, patient is to take oral antihistamines as directed.  The following labs have been ordered: FCeRI antibody, TSH, anti-thyroglobulin antibody, thyroid peroxidase antibody, tryptase, urea breath test, ESR, ANA, and galactose-alpha-1,3-galactose IgE level.  The patient will be called with further recommendations after lab results have returned.  Restart/continue H1/H2 receptor blockade with titration to find lowest effective dose.  A journal is to be kept recording any foods eaten, beverages consumed, medications taken within a 6 hour period prior to the onset of significant or new symptoms, as well as record activities being performed, and environmental conditions. For any symptoms concerning for anaphylaxis, 911 is to be called immediately.

## 2015-08-05 NOTE — Progress Notes (Signed)
Follow-up Note  RE: [name] MRN: [id number] DOB: August 28, 1950 Date of Office Visit: 08/05/2015  Primary care provider: Donnie Coffin, MD Referring provider: Alroy Dust, Carlean Jews.Marlou Sa, MD  History of present illness: HPI Comments: [name] is a 65 y.o. male with recurrent angioedema and generalized pruritus presents today for follow up and allergy testing.  He was unable to undergo testing during his initial evaluation due to recent administration of antihistamine and prolonged use of systemic steroids.  He has been off of steroids for the past 2 weeks and off of antihistamines for the past 4 days in anticipation of today's testing.  He reports that over this past week while off of steroids and antihistamines he has experienced and edema of the eyelids as well as urticaria, particularly on his abdomen, back, and bilateral upper extremities.  He does not experience concomitant cardiopulmonary symptoms, GI symptoms, or constitutional symptoms.   Assessment and plan: Urticaria with associated angioedema Unclear etiology. Skin tests to select food allergens were negative today. NSAIDs and emotional stress commonly exacerbate urticaria but are not the underlying etiology in this case. Physical urticarias are negative by history (i.e. pressure-induced, temperature, vibration, solar, etc.). History and lesions are not consistent with urticaria pigmentosa so I am not suspicious for mastocytosis. There are no concomitant symptoms concerning for anaphylaxis or constitutional symptoms worrisome for an underlying malignancy. We will rule out other potential etiologies with labs. For symptom relief, patient is to take oral antihistamines as directed.  The following labs have been ordered: FCeRI antibody, TSH, anti-thyroglobulin antibody, thyroid peroxidase antibody, tryptase, urea breath test, ESR, ANA, and galactose-alpha-1,3-galactose IgE level.  The patient will be called with further recommendations  after lab results have returned.  Restart/continue H1/H2 receptor blockade with titration to find lowest effective dose.  A journal is to be kept recording any foods eaten, beverages consumed, medications taken within a 6 hour period prior to the onset of significant or new symptoms, as well as record activities being performed, and environmental conditions. For any symptoms concerning for anaphylaxis, 911 is to be called immediately.    Meds ordered this encounter  Medications  . triamcinolone ointment (KENALOG) 0.1 %    Sig: Apply 1 application topically 2 (two) times daily.    Dispense:  80 g    Refill:  1    Diagnositics: Select food allergen and environmental skin tests: Negative despite a positive histamine control.      Physical examination: Blood pressure 110/68, pulse 100, resp. rate 18.  General: Alert, interactive, in no acute distress. HEENT: TMs pearly gray, turbinates mildly edematous without discharge, post-pharynx mildly erythematous. Neck: Supple without lymphadenopathy. Lungs: Clear to auscultation without wheezing, rhonchi or rales. CV: Normal S1, S2 without murmurs. Skin: Bilateral eyelid edema, erythematous excoriated patches on the forearms and abdomen.  The following portions of the patient's history were reviewed and updated as appropriate: allergies, current medications, past family history, past medical history, past social history, past surgical history and problem list.    Medication List       This list is accurate as of: 08/05/15 12:45 PM.  Always use your most recent med list.               acetaminophen 325 MG tablet  Commonly known as:  TYLENOL  Take 650 mg by mouth every 6 (six) hours as needed for mild pain or headache. Reported on 07/22/2015     aspirin 81 MG tablet  Take 81 mg by  mouth every evening.     clopidogrel 75 MG tablet  Commonly known as:  PLAVIX  Take 1 tablet (75 mg total) by mouth daily.     CRESTOR 20 MG tablet    Generic drug:  rosuvastatin  take 1 tablet by mouth once daily at bedtime     folic acid 1 MG tablet  Commonly known as:  FOLVITE  Take 1 tablet (1 mg total) by mouth daily.     levocetirizine 5 MG tablet  Commonly known as:  XYZAL  TAKE ONE TABLET ONCE DAILY AS DIRECTED     lisinopril 5 MG tablet  Commonly known as:  PRINIVIL,ZESTRIL  take 1 tablet by mouth once daily     metoprolol 50 MG tablet  Commonly known as:  LOPRESSOR  take 1 tablet by mouth twice a day     NITROSTAT 0.4 MG SL tablet  Generic drug:  nitroGLYCERIN  Place 0.4 mg under the tongue every 5 (five) minutes as needed. Chest pain     predniSONE 20 MG tablet  Commonly known as:  DELTASONE  Reported on 08/05/2015     ranitidine 300 MG tablet  Commonly known as:  ZANTAC  take 1 tablet by mouth once daily at bedtime     thiamine 100 MG tablet  Commonly known as:  VITAMIN B-1  Take 100 mg by mouth daily.     traMADol 50 MG tablet  Commonly known as:  ULTRAM  Take 1 tablet (50 mg total) by mouth every 6 (six) hours as needed.     triamcinolone ointment 0.1 %  Commonly known as:  KENALOG  Apply 1 application topically 2 (two) times daily.     warfarin 5 MG tablet  Commonly known as:  COUMADIN  take as directed BY COUMADIN CLINIC        Allergies  Allergen Reactions  . Norvasc [Amlodipine Besylate] Swelling       . Pletal [Cilostazol] Diarrhea  . 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 Please do not hesitate to contact me with questions.  Sincerely,   R. Edgar Frisk, MD

## 2015-08-06 DIAGNOSIS — M47817 Spondylosis without myelopathy or radiculopathy, lumbosacral region: Secondary | ICD-10-CM | POA: Diagnosis not present

## 2015-08-06 DIAGNOSIS — M545 Low back pain: Secondary | ICD-10-CM | POA: Diagnosis not present

## 2015-08-06 LAB — ANA: Anti Nuclear Antibody(ANA): NEGATIVE

## 2015-08-06 LAB — H. PYLORI BREATH TEST: H. pylori Breath Test: DETECTED — AB

## 2015-08-08 LAB — ALPHA-GAL PANEL
Allergen, Mutton, f88: <0.1 kU/L
Allergen, Pork, f26: <0.1 kU/L
Beef: <0.1 kU/L
Galactose-alpha-1,3-galactose IgE: <0.1 kU/L (ref <0.35)

## 2015-08-08 LAB — TRYPTASE: Tryptase: 9.8 ug/L (ref <11)

## 2015-08-11 ENCOUNTER — Encounter: Payer: Self-pay | Admitting: *Deleted

## 2015-08-11 ENCOUNTER — Telehealth: Payer: Self-pay | Admitting: Cardiovascular Disease

## 2015-08-11 NOTE — Telephone Encounter (Signed)
New message    Request for surgical clearance:  1. What type of surgery is being performed? Lumbar steroid injection    2. When is this surgery scheduled? Pending   3. Are there any medications that need to be held prior to surgery and how long? plavix    4. Name of physician performing surgery? Dr. Ron Agee   5. What is your office phone and fax number? [phone] /  fax [phone]

## 2015-08-11 NOTE — Telephone Encounter (Signed)
Telephone encounter faxed to [phone].

## 2015-08-11 NOTE — Telephone Encounter (Signed)
By chart review the patient has stopped warfarin and is now back on plavix. It is acceptable to hold plavix 5 days prior to injection if necessary. thx

## 2015-08-14 LAB — CP CHRONIC URTICARIA INDEX PANEL
Histamine Release: <16 %
TSH: 0.6 m[IU]/L (ref 0.40–4.50)
Thyroglobulin Ab: <1 [IU]/mL
Thyroperoxidase Ab SerPl-aCnc: 3 [IU]/mL

## 2015-08-19 ENCOUNTER — Telehealth: Payer: Self-pay | Admitting: *Deleted

## 2015-08-19 NOTE — Telephone Encounter (Signed)
Left message for patient to call office.  

## 2015-08-19 NOTE — Telephone Encounter (Addendum)
Per Dr. Verlin Fester patient to take Protonix 20 mg twice daily for 14 days instead of Omeprazole due to possible side effect with Plavix. Did not send scripts into pharmacy.

## 2015-08-19 NOTE — Telephone Encounter (Signed)
-----   Message from Adelina Mings, MD sent at 08/19/2015  1:46 PM EDT ----- Please inform patient that urea breath test was positive indicating the presence of Helicobacter pylori in the stomach.  For certain individuals, the presence of this bacteria in the stomach contributes to urticaria, either way it is a good idea to eradicate H. pylori prevent other potential problems it may cause. For H. pylori eradication, we will prescribe omeprazole 20 mg twice daily x 14 days, amoxicillin 1000 mg twice daily x 14 days, and clarithromycin 500 mg twice daily x 14 days.  Continue symptom/exposure journal. H1/H2 receptor blockade, titrating to the lowest effective dose necessary to suppress urticaria.

## 2015-08-20 ENCOUNTER — Other Ambulatory Visit: Payer: Self-pay

## 2015-08-20 MED ORDER — CLARITHROMYCIN 250 MG PO TABS: 500.0000 mg | ORAL_TABLET | Freq: Two times a day (BID) | ORAL | Status: DC

## 2015-08-20 MED ORDER — AMOXICILLIN 500 MG PO CAPS: 1000.0000 mg | ORAL_CAPSULE | Freq: Two times a day (BID) | ORAL | Status: DC

## 2015-08-20 MED ORDER — PANTOPRAZOLE SODIUM 20 MG PO TBEC: 20.0000 mg | DELAYED_RELEASE_TABLET | Freq: Two times a day (BID) | ORAL | Status: DC

## 2015-08-20 NOTE — Telephone Encounter (Signed)
Sent in protonix

## 2015-08-20 NOTE — Telephone Encounter (Signed)
Sent in protonix, amox, clarithromycin to pharmacy and spoke wit pt and his wife . Wife will be contacting us if he is not better in 2 weeks

## 2015-08-20 NOTE — Telephone Encounter (Signed)
Answered general medication and H. Pylori questions to the wife's satisfaction.  Understands and is very thankful for the information

## 2015-08-20 NOTE — Telephone Encounter (Signed)
Wife called to ask a few questions. She is wanting to know exactly how did he get this and also what can he do to avoid having this happen.

## 2015-08-22 ENCOUNTER — Other Ambulatory Visit: Payer: Self-pay

## 2015-08-22 MED ORDER — ROSUVASTATIN CALCIUM 20 MG PO TABS: 20.0000 mg | ORAL_TABLET | Freq: Every day | ORAL | Status: DC

## 2015-08-25 DIAGNOSIS — M545 Low back pain: Secondary | ICD-10-CM | POA: Diagnosis not present

## 2015-08-25 DIAGNOSIS — M47817 Spondylosis without myelopathy or radiculopathy, lumbosacral region: Secondary | ICD-10-CM | POA: Diagnosis not present

## 2015-09-10 ENCOUNTER — Encounter: Payer: Self-pay | Admitting: Allergy and Immunology

## 2015-09-10 ENCOUNTER — Ambulatory Visit (INDEPENDENT_AMBULATORY_CARE_PROVIDER_SITE_OTHER): Payer: Medicare Other | Admitting: Allergy and Immunology

## 2015-09-10 VITALS — BP 140/80 | HR 92 | Resp 20

## 2015-09-10 DIAGNOSIS — I25810 Atherosclerosis of coronary artery bypass graft(s) without angina pectoris: Secondary | ICD-10-CM | POA: Diagnosis not present

## 2015-09-10 DIAGNOSIS — L500 Allergic urticaria: Secondary | ICD-10-CM | POA: Diagnosis not present

## 2015-09-10 NOTE — Patient Instructions (Addendum)
Urticaria with associated angioedema  Restart/continue H1/H2 receptor blockade, titrating to the lowest effective dose necessary to suppress urticaria.    No Follow-up on file.  Urticaria (Hives)  . Levocetirizine (Xyzal) 5 mg in morning and Cetirizine (Zyrtec) 10mg  at night and ranitidine (Zantac) 150 mg twice a day. If no symptoms for 7-14 days then decrease to. . Levocetirizine (Xyzal) 5 mg in morning and Cetirizine (Zyrtec) 10mg  at night and ranitidine (Zantac) 150 mg once a day.  If no symptoms for 7-14 days then decrease to. . Levocetirizine (Xyzal) 5 mg in morning and Cetirizine (Zyrtec) 10mg  at night.  If no symptoms for 7-14 days then decrease to. . Levocetirizine (Xyzal) 5 mg once a day.  May use Benadryl (diphenhydramine) as needed for breakthrough symptoms       If symptoms return, then step up dosage

## 2015-09-10 NOTE — Assessment & Plan Note (Addendum)
   Restart/continue H1/H2 receptor blockade, titrating to the lowest effective dose necessary to suppress urticaria.

## 2015-09-10 NOTE — Progress Notes (Signed)
Follow-up Note  RE: DEVONN FALLONE MRN: [id number] DOB: Jul 22, 1950 Date of Office Visit: 09/10/2015  Primary care provider: Donnie Coffin, MD Referring provider: Alroy Dust, Carlean Jews.Marlou Sa, MD  History of present illness: HPI Comments: OAKLEN GALLIHUGH is a 65 y.o. male with urticaria/angioedema who presents today for follow up.  He is accompanied by his wife who assists with the history.  His labs were all negative/within normal limits with the exception of urea breath test.  He underwent triple therapy for H. pylori eradication.  While he was taking these medications, he discontinued his oral antihistamines and did not restart the antihistamines.  Over the past few days he has developed urticaria on his forearms and legs.  He has not had associated angioedema recently.  He has not experienced concomitant cardiopulmonary or GI symptoms.   Assessment and plan: Urticaria with associated angioedema  Restart/continue H1/H2 receptor blockade, titrating to the lowest effective dose necessary to suppress urticaria.      Physical examination: Blood pressure 140/80, pulse 92, resp. rate 20.  General: Alert, interactive, in no acute distress. Lungs: Clear to auscultation without wheezing, rhonchi or rales. CV: Normal S1, S2 without murmurs. Skin: Scattered erythematous urticarial type lesions primarily located forearms bilaterally , nonvesicular.  The following portions of the patient's history were reviewed and updated as appropriate: allergies, current medications, past family history, past medical history, past social history, past surgical history and problem list.    Medication List       This list is accurate as of: 09/10/15  1:48 PM.  Always use your most recent med list.               acetaminophen 325 MG tablet  Commonly known as:  TYLENOL  Take 650 mg by mouth every 6 (six) hours as needed for mild pain or headache. Reported on 07/22/2015     aspirin 81 MG tablet  Take 81 mg by  mouth every evening.     clarithromycin 250 MG tablet  Commonly known as:  BIAXIN  Take 2 tablets (500 mg total) by mouth 2 (two) times daily.     clopidogrel 75 MG tablet  Commonly known as:  PLAVIX  Take 1 tablet (75 mg total) by mouth daily.     folic acid 1 MG tablet  Commonly known as:  FOLVITE  Take 1 tablet (1 mg total) by mouth daily.     levocetirizine 5 MG tablet  Commonly known as:  XYZAL  TAKE ONE TABLET ONCE DAILY AS DIRECTED     lisinopril 5 MG tablet  Commonly known as:  PRINIVIL,ZESTRIL  take 1 tablet by mouth once daily     metoprolol 50 MG tablet  Commonly known as:  LOPRESSOR  take 1 tablet by mouth twice a day     NITROSTAT 0.4 MG SL tablet  Generic drug:  nitroGLYCERIN  Place 0.4 mg under the tongue every 5 (five) minutes as needed. Chest pain     pantoprazole 20 MG tablet  Commonly known as:  PROTONIX  Take 1 tablet (20 mg total) by mouth 2 (two) times daily.     predniSONE 20 MG tablet  Commonly known as:  DELTASONE  Reported on 09/10/2015     ranitidine 300 MG tablet  Commonly known as:  ZANTAC  take 1 tablet by mouth once daily at bedtime     rosuvastatin 20 MG tablet  Commonly known as:  CRESTOR  Take 1 tablet (20 mg total) by mouth  at bedtime.     thiamine 100 MG tablet  Commonly known as:  VITAMIN B-1  Take 100 mg by mouth daily.     traMADol 50 MG tablet  Commonly known as:  ULTRAM  Take 1 tablet (50 mg total) by mouth every 6 (six) hours as needed.     triamcinolone ointment 0.1 %  Commonly known as:  KENALOG  Apply 1 application topically 2 (two) times daily.     warfarin 5 MG tablet  Commonly known as:  COUMADIN  take as directed BY COUMADIN CLINIC        Allergies  Allergen Reactions  . Norvasc [Amlodipine Besylate] Swelling       . Pletal [Cilostazol] Diarrhea  . Heparin Other (See Comments)    HIT positive as of 12/31/14    I appreciate the opportunity to take part in this Arjun's care. Please do not hesitate to  contact me with questions.  Sincerely,   R. Edgar Frisk, MD

## 2015-09-11 DIAGNOSIS — M545 Low back pain: Secondary | ICD-10-CM | POA: Diagnosis not present

## 2015-09-11 DIAGNOSIS — M47817 Spondylosis without myelopathy or radiculopathy, lumbosacral region: Secondary | ICD-10-CM | POA: Diagnosis not present

## 2015-09-24 DIAGNOSIS — L300 Nummular dermatitis: Secondary | ICD-10-CM | POA: Diagnosis not present

## 2015-10-01 DIAGNOSIS — M545 Low back pain: Secondary | ICD-10-CM | POA: Diagnosis not present

## 2015-10-01 DIAGNOSIS — M5116 Intervertebral disc disorders with radiculopathy, lumbar region: Secondary | ICD-10-CM | POA: Diagnosis not present

## 2015-10-01 DIAGNOSIS — M47817 Spondylosis without myelopathy or radiculopathy, lumbosacral region: Secondary | ICD-10-CM | POA: Diagnosis not present

## 2015-10-06 ENCOUNTER — Telehealth: Payer: Self-pay | Admitting: Nurse Practitioner

## 2015-10-06 ENCOUNTER — Telehealth: Payer: Self-pay | Admitting: *Deleted

## 2015-10-06 NOTE — Telephone Encounter (Signed)
Walk in pt form-pt would like to speak with nurse about reactions to meds from hospital.

## 2015-10-06 NOTE — Telephone Encounter (Signed)
S/w pt's wife per DPR stated ever since hospitalist changed medications when pt was in of cabg pt has not been right, has had a rash, forgetful, eyes swollen, been on antihistamine's, making pt feel tired and prednisone for 4 months. Seen Allergist and a Dermatologist and stated cannot find anything wrong. Pt's wife looked up metoprolol and the side effect's match to what the pt has.  Also read if pt has rainos should never be on metoprolol.  Wants pt to go back on Coreg.  See's Cecille Rubin next week and thought if we can change today to see if any better before appointment would greatly be appreciated.

## 2015-10-06 NOTE — Telephone Encounter (Signed)
Was previously on Coreg 37.5 BID. Now on Metoprolol tartrate 50 mg BID.   Wanting to switch back.  Discussed with pharmacist here - will start Coreg 6.25 mg BID with follow up as planned.

## 2015-10-07 NOTE — Telephone Encounter (Signed)
Follow Up ° °Pt wife returned the call  °

## 2015-10-08 ENCOUNTER — Ambulatory Visit: Payer: Medicare Other | Admitting: Nurse Practitioner

## 2015-10-08 ENCOUNTER — Other Ambulatory Visit: Payer: Self-pay | Admitting: *Deleted

## 2015-10-08 MED ORDER — CARVEDILOL 6.25 MG PO TABS: 6.2500 mg | ORAL_TABLET | Freq: Two times a day (BID) | ORAL | Status: DC

## 2015-10-08 NOTE — Telephone Encounter (Signed)
S/w pt's wife per DPR is agreeable to treatment plan will d/c metoprolol ( 50 mg ) bid and start Coreg ( 6.25 mg ) bid.  Sent in to pt's requested pharmacy. Will see pt next week for f/u appointment.

## 2015-10-14 ENCOUNTER — Other Ambulatory Visit: Payer: Self-pay | Admitting: *Deleted

## 2015-10-14 ENCOUNTER — Encounter: Payer: Self-pay | Admitting: Nurse Practitioner

## 2015-10-14 ENCOUNTER — Ambulatory Visit (INDEPENDENT_AMBULATORY_CARE_PROVIDER_SITE_OTHER): Payer: Medicare Other | Admitting: Nurse Practitioner

## 2015-10-14 VITALS — BP 210/140 | HR 68 | Ht 75.0 in | Wt 207.4 lb

## 2015-10-14 DIAGNOSIS — I25810 Atherosclerosis of coronary artery bypass graft(s) without angina pectoris: Secondary | ICD-10-CM

## 2015-10-14 DIAGNOSIS — I10 Essential (primary) hypertension: Secondary | ICD-10-CM

## 2015-10-14 DIAGNOSIS — I739 Peripheral vascular disease, unspecified: Secondary | ICD-10-CM | POA: Diagnosis not present

## 2015-10-14 DIAGNOSIS — N179 Acute kidney failure, unspecified: Secondary | ICD-10-CM

## 2015-10-14 DIAGNOSIS — E785 Hyperlipidemia, unspecified: Secondary | ICD-10-CM

## 2015-10-14 LAB — BASIC METABOLIC PANEL
BUN: 17 mg/dL (ref 7–25)
CO2: 27 mmol/L (ref 20–31)
Calcium: 9.1 mg/dL (ref 8.6–10.3)
Chloride: 98 mmol/L (ref 98–110)
Creat: 0.97 mg/dL (ref 0.70–1.25)
Glucose, Bld: 92 mg/dL (ref 65–99)
Potassium: 4.7 mmol/L (ref 3.5–5.3)
Sodium: 133 mmol/L — ABNORMAL LOW (ref 135–146)

## 2015-10-14 LAB — CBC WITH DIFFERENTIAL/PLATELET
Basophils Absolute: 82 cells/uL (ref 0–200)
Basophils Relative: 1 %
Eosinophils Absolute: 492 cells/uL (ref 15–500)
Eosinophils Relative: 6 %
HCT: 36.1 % — ABNORMAL LOW (ref 38.5–50.0)
Hemoglobin: 12.7 g/dL — ABNORMAL LOW (ref 13.2–17.1)
Lymphocytes Relative: 14 %
Lymphs Abs: 1148 cells/uL (ref 850–3900)
MCH: 34.6 pg — ABNORMAL HIGH (ref 27.0–33.0)
MCHC: 35.2 g/dL (ref 32.0–36.0)
MCV: 98.4 fL (ref 80.0–100.0)
MPV: 8.9 fL (ref 7.5–12.5)
Monocytes Absolute: 1066 cells/uL — ABNORMAL HIGH (ref 200–950)
Monocytes Relative: 13 %
Neutro Abs: 5412 cells/uL (ref 1500–7800)
Neutrophils Relative %: 66 %
Platelets: 228 10*3/uL (ref 140–400)
RBC: 3.67 MIL/uL — ABNORMAL LOW (ref 4.20–5.80)
RDW: 13.3 % (ref 11.0–15.0)
WBC: 8.2 10*3/uL (ref 3.8–10.8)

## 2015-10-14 MED ORDER — LISINOPRIL 10 MG PO TABS: 10.0000 mg | ORAL_TABLET | Freq: Every day | ORAL | Status: DC

## 2015-10-14 MED ORDER — CARVEDILOL 12.5 MG PO TABS: 12.5000 mg | ORAL_TABLET | Freq: Two times a day (BID) | ORAL | Status: DC

## 2015-10-14 NOTE — Patient Instructions (Addendum)
We will be checking the following labs today - BMET and CBC  Medication Instructions:    Continue with your current medicines. BUT  I am increasing the Coreg to 12.5 mg twice a day - take 2 of your 6.25 mg tablets twice a day to = this dose  I am increasing the Lisinopril to 10 mg a day - take 2 of your 5 mg tablets once a day to = this dose    Testing/Procedures To Be Arranged:  N/A  Follow-Up:   See me in 2 weeks.     Other Special Instructions:   BP check 2 to 3 times a day  Call here Friday with an update  BP today 170/100    If you need a refill on your cardiac medications before your next appointment, please call your pharmacy.   Call the [place] office at [phone] if you have any questions, problems or concerns.

## 2015-10-14 NOTE — Progress Notes (Signed)
CARDIOLOGY OFFICE NOTE  Date:  10/14/2015    [name] Date of Birth: 1950-06-04 Medical Record [id number]  PCP:  Donnie Coffin, MD  Cardiologist:  Burt Knack    Chief Complaint  Patient presents with  . Coronary Artery Disease  . Hypertension  . PAD    4 month check - seen for Dr. Burt Knack    History of Present Illness: [name] is a 65 y.o. male who presents today for a 4 month check. Seen for Dr. Burt Knack.   Former patient of Dr. Susa Simmonds. He has an extensive history of PVD and CAD. He was previously followed by Dr. Bridgett Larsson at [facility] but now seeing Vascular in [facility] with Dr. Sammuel Hines. Prior cath in 2006 showing normal LV function with a totally occluded RCA with left to right collaterals. He has distal small vessel disease in the RCA with diffuse mild to moderate stenosis in the LAD. Former smoker. His PVD history is quite extensive and includes AAA (with no plans to intervene due to the nature of his aneurysm and his multiple comorbidities).   Other problems include HTN, HLD and OA. He has a history of hyponatremia as well.  He presented back in July of 2016 with chest pain/NSTEMI and underwent CABG x 5 by Dr. Cyndia Bent. He had vein harvesting from the right lower extremity. He then presented with progressive right lower extremity swelling and discomfort. He followed up with the cardiac surgery office on 8/2 and was started on Lasix and potassium for bilateral lower extremity edema. He fell at home and he was on the floor for at least 2 hours before his wife found him and she brought him back to the ER.   He was found to have acute RLE DVT and PE. Was on IV heparin, transitioned to Xarelto due to drop in platelets, HIT panel + and serotonin release assay + as well and he was changed to Arixtra and seen by Dr. Alen Blew. Warfarin started and he is to be on 6 months of therapy. He did require transfusion of 2 units PRBCs.   I have seen him several times post op - little puny and  slow to improve. Was still having considerable right leg pain - I put him on Ultram. Plavix stopped.   I saw him in early September of 2016 - he looked better but still with considerable pain in the right foot - most likely multifactorial - arterial/recent DVT/venous insufficiency. Was encouraged to see Dr. Sammuel Hines by [facility] but this is a pretty big ordeal to go to [facility] and [place] felt that since there "was nothing to do then not to go". He was a little depressed. Had stopped his alcohol intake.   At his follow up visit in October of 2016 - he was much better - really back to his baseline. Had been back to [facility] - aneurysm had grown - but still no plan to intervene. Was told to "go live his life". Anxious to get back to some traveling within the [place]. He had been released from hematology - was to stop coumadin after total 6 months of therapy.   I last saw him in January. Was to stop Coumadin on March 1st and was going to transition back to Plavix afterwards. Cardiac status was stable but was having allergic/rash symptoms. They were headed to [place] in February.   He comes in today. Here with his wife. His BP is up. His allergies/rash has been treated with  several courses of steroids since last seen here. Apparently he had +H pylori. Not really allergic to any specific agent. Is now on antihistamines. Has had pinched nerve in his back and has had 2 epidurals - not really helped. Interesting that his oral prednisone did not help his back. Has had his beta blocker switched back to Coreg due to rash/allergic reaction and patient's wife request. This has been only for the past 5 days. He is on much lower doses of his BP medicines than he has been on previously. Has had some headaches recently. They have not been checking the BP at home. He has really gained a significant amount of weight due to the steroids. Not clear about how much salt he is using. He is back drinking alcohol. They have been to [facility]. Wife feels  like there has been some confusion - I do not really get a sense for this today.  Past Medical History  Diagnosis Date  . Hyperlipidemia   . Coronary artery disease   . AAA (abdominal aortic aneurysm) ([place])   . PAD (peripheral artery disease) ([place])   . COPD (chronic obstructive pulmonary disease) ([place])   . Raynaud's disease /phenomenon   . Lumbar degenerative disc disease   . Stroke ([place]) 10/15/2005  . PONV (postoperative nausea and vomiting)   . Hypertension     takes meds daily  . Anxiety   . GERD (gastroesophageal reflux disease)   . History of blood transfusion   . Hyponatremia   . Myocardial infarction ([place])   . CHF (congestive heart failure) [facility])     Past Surgical History  Procedure Laterality Date  . Iliac artery stent  10/15/2005     Left common and external   . Cardiac catheterization  2006  . Carotid endarterectomy      bilateral  . Carotid endarterectomy  04/13/2005    Right and 06/03/2005 Left  . Abdominal angiogram  03/30/2012  . Hernia repair    . Abdominal aortagram N/A 03/30/2012    Procedure: ABDOMINAL Maxcine Ham;  Surgeon: Conrad Burr Ridge, MD;  Location: [facility] CATH LAB;  Service: Cardiovascular;  Laterality: N/A;  . Cardiac catheterization N/A 12/09/2014    Procedure: Left Heart Cath and Coronary Angiography;  Surgeon: [name], MD;  Location: [facility] CV LAB;  Service: Cardiovascular;  Laterality: N/A;  . Coronary artery bypass graft N/A 12/13/2014    Procedure: CORONARY ARTERY BYPASS GRAFT times five using left internal mammary artery and endoscopic right leg saphenous vein harvest;  Surgeon: Gaye Pollack, MD;  Location: [facility] OR;  Service: Open Heart Surgery;  Laterality: N/A;  . Intraoperative transesophageal echocardiogram N/A 12/13/2014    Procedure: INTRAOPERATIVE TRANSESOPHAGEAL ECHOCARDIOGRAM;  Surgeon: Gaye Pollack, MD;  Location: [facility] OR;  Service: Open Heart Surgery;  Laterality: N/A;     Medications: Current Outpatient Prescriptions  Medication  Sig Dispense Refill  . acetaminophen (TYLENOL) 325 MG tablet Take 650 mg by mouth every 6 (six) hours as needed for mild pain or headache. Reported on 07/22/2015    . aspirin 81 MG tablet Take 81 mg by mouth every evening.     . clobetasol ointment (TEMOVATE) 0.05 % apply to affected area twice a day if needed  0  . clopidogrel (PLAVIX) 75 MG tablet Take 1 tablet (75 mg total) by mouth daily. 90 tablet 3  . folic acid (FOLVITE) 1 MG tablet Take 1 tablet (1 mg total) by mouth daily. 30 tablet 0  . levocetirizine (  XYZAL) 5 MG tablet TAKE ONE TABLET ONCE DAILY AS DIRECTED 30 tablet 5  . NITROSTAT 0.4 MG SL tablet Place 0.4 mg under the tongue every 5 (five) minutes as needed. Chest pain  0  . pantoprazole (PROTONIX) 20 MG tablet Take 1 tablet (20 mg total) by mouth 2 (two) times daily. 28 tablet 0  . predniSONE (DELTASONE) 20 MG tablet Reported on 09/10/2015  0  . ranitidine (ZANTAC) 300 MG tablet take 1 tablet by mouth once daily at bedtime 30 tablet 10  . rosuvastatin (CRESTOR) 20 MG tablet Take 1 tablet (20 mg total) by mouth at bedtime. 90 tablet 1  . Thiamine HCl (VITAMIN B-1) 100 MG tablet Take 100 mg by mouth daily.      . traMADol (ULTRAM) 50 MG tablet Take 1 tablet (50 mg total) by mouth every 6 (six) hours as needed. 30 tablet 0  . triamcinolone ointment (KENALOG) 0.1 % Apply 1 application topically 2 (two) times daily. 80 g 1  . warfarin (COUMADIN) 5 MG tablet take as directed BY COUMADIN CLINIC 30 tablet 3  . carvedilol (COREG) 12.5 MG tablet Take 1 tablet (12.5 mg total) by mouth 2 (two) times daily. 60 tablet 3  . lisinopril (PRINIVIL,ZESTRIL) 10 MG tablet Take 1 tablet (10 mg total) by mouth daily. 30 tablet 3   No current facility-administered medications for this visit.    Allergies: Allergies  Allergen Reactions  . Hctz [Hydrochlorothiazide] Other (See Comments)    Black out due to electrolytes  . Norvasc [Amlodipine Besylate] Swelling       . Pletal [Cilostazol] Diarrhea    . Heparin Other (See Comments)    HIT positive as of 12/31/14    Social History: The patient  reports that he quit smoking about 10 years ago. His smoking use included Cigarettes. He has never used smokeless tobacco. He reports that he drinks about 14.4 oz of alcohol per week. He reports that he does not use illicit drugs.   Family History: The patient's family history includes Coronary artery disease (age of onset: 55) in his father; Dementia in his mother; Heart disease in his brother and father; Hyperlipidemia in his brother and father; Hypertension in his brother and father; Other (age of onset: 56) in his mother; Peripheral vascular disease in his father.   Review of Systems: Please see the history of present illness.   Otherwise, the review of systems is positive for none.   All other systems are reviewed and negative.   Physical Exam: VS:  BP 210/140 mmHg  Pulse 68  Ht 6\' 3"  (1.905 m)  Wt 207 lb 6.4 oz (94.076 kg)  BMI 25.92 kg/m2 .  BMI Body mass index is 25.92 kg/(m^2).  Wt Readings from Last 3 Encounters:  10/14/15 207 lb 6.4 oz (94.076 kg)  07/22/15 204 lb 12.9 oz (92.9 kg)  06/11/15 192 lb 12.8 oz (87.454 kg)   BP by me is 170/100 in the left arm and 180/110 in the right.   General: Pleasant. Well developed, well nourished and in no acute distress. He has gained 15 pounds since last seen here.   HEENT: Normal. Neck: Supple, no JVD, carotid bruits, or masses noted.  Cardiac: Regular rate and rhythm. No murmurs, rubs, or gallops. No edema.  Respiratory:  Lungs are clear to auscultation bilaterally with normal work of breathing.  GI: Soft and nontender.  MS: No deformity or atrophy. Gait and ROM intact. Skin: Warm and dry. Color is normal.  Neuro:  Strength and sensation are intact and no gross focal deficits noted.  Psych: Alert, appropriate and with normal affect.   LABORATORY DATA:  EKG:  EKG is not ordered today.  Lab Results  Component Value Date   WBC 8.2  06/11/2015   HGB 12.9* 06/11/2015   HCT 37.0* 06/11/2015   PLT 260 06/11/2015   GLUCOSE 100* 06/11/2015   CHOL 135 06/11/2015   TRIG 52 06/11/2015   HDL 69 06/11/2015   LDLCALC 56 06/11/2015   ALT 9 06/11/2015   AST 17 06/11/2015   NA 131* 06/11/2015   K 5.0 06/11/2015   CL 97* 06/11/2015   CREATININE 0.92 06/11/2015   BUN 16 06/11/2015   CO2 26 06/11/2015   TSH 0.60 08/05/2015   INR 1.8 07/14/2015   HGBA1C 5.4 12/12/2014    BNP (last 3 results)  Recent Labs  12/27/14 1028  BNP 568.1*    ProBNP (last 3 results)  Recent Labs  11/08/14 1544  PROBNP 179.0*     Other Studies Reviewed Today:  Assessment & Plan:  Acute RLE DVT and PE - treated with 6 months of Coumadin - this was stopped March 1st, 2017. Did have + HIT panel. No longer seeing Dr. Alen Blew.   Anemia-post op blood loss anemia - had previously stopped his iron due to side effects - last check showing slow improvement - recheck labs today  Thrombocytopenia - Heparin induced clinically and HIT Ab panel positive - Serotonin release assay positive as well and hence HIT confirmed - Heparin is listed as an allergy for him   Acute renal failure/dehydration with hyponatremia - resolved   Essential hypertension, benign - BP not controlled - increasing Coreg to 12.5 mg BID (he had been on 37.5 BID in the past) and increasing the Lisinopril to 10 mg QD (had been on 20 mg BID in the past). To monitor BP at home. Call me with an update on Friday. I will see him back in about 2 weeks. Lab today.    CAD/recent NSTEMI/S/P CABG x 5 (12/13/14) - Continue beta blocker, crestor. Back on Plavix. No symptoms.    PAD  -with prior right foot pain - most likely multifactorial - much better - no longer taking pain medicines.    AAA  -Very complex and due to degree of calcification has been deemed not electively operable -Followed by Dr. Sammuel Hines at [facility]  Rash/allergic reaction  .   Current medicines are  reviewed with the patient today.  The patient does not have concerns regarding medicines other than what has been noted above.  The following changes have been made:  See above.  Labs/ tests ordered today include:   No orders of the defined types were placed in this encounter.     Disposition:   FU with me in 2 weeks. Call with BP update to me on Friday.   Patient is agreeable to this plan and will call if any problems develop in the interim.   Signed: Burtis Junes, RN, ANP-C 10/14/2015 12:11 PM  [facility] [place] [address] [place], [place]  [address] Phone: [phone] Fax: [phone]

## 2015-10-14 NOTE — Addendum Note (Signed)
Addended by: Domenica Reamer R on: 10/14/2015 12:33 PM   Modules accepted: Orders

## 2015-10-17 ENCOUNTER — Telehealth: Payer: Self-pay | Admitting: Nurse Practitioner

## 2015-10-17 NOTE — Telephone Encounter (Signed)
New Message  Pt wife calling to report BP readings since Tues- 5/23.  5/23 4pm 163/89   5/24 730am 177/90  1pm 166/90  5/25 12pm 182/95  5/26 130pm 152/83

## 2015-10-17 NOTE — Telephone Encounter (Signed)
Spoke with wife and informed her of recommendations per Cecille Rubin. Advised wife to have pt continue recording BP's and bring those readings to appt on 6/5. Wife verbalized understanding and was in agreement with this plan.

## 2015-10-17 NOTE — Telephone Encounter (Signed)
Spoke with pt's wife and she states that pt has not had any headaches since increase in medication. She denies pt having any dizziness or lightheadedness. Advised I will send readings to [facility] for review and advisement. Wife is taking family pet to vet and asks that we leave a message with instructions when we call back if no one answers.

## 2015-10-17 NOTE — Telephone Encounter (Signed)
Continue to with current medicines. He has only been on the additional medicines just a few days.  Continue to monitor BP I will see back as planned.

## 2015-10-23 DIAGNOSIS — M47817 Spondylosis without myelopathy or radiculopathy, lumbosacral region: Secondary | ICD-10-CM | POA: Diagnosis not present

## 2015-10-23 DIAGNOSIS — M545 Low back pain: Secondary | ICD-10-CM | POA: Diagnosis not present

## 2015-10-27 ENCOUNTER — Encounter: Payer: Self-pay | Admitting: Nurse Practitioner

## 2015-10-27 ENCOUNTER — Ambulatory Visit (INDEPENDENT_AMBULATORY_CARE_PROVIDER_SITE_OTHER): Payer: Medicare Other | Admitting: Nurse Practitioner

## 2015-10-27 VITALS — BP 170/120 | HR 72 | Ht 72.0 in | Wt 205.2 lb

## 2015-10-27 DIAGNOSIS — I25810 Atherosclerosis of coronary artery bypass graft(s) without angina pectoris: Secondary | ICD-10-CM

## 2015-10-27 DIAGNOSIS — I739 Peripheral vascular disease, unspecified: Secondary | ICD-10-CM | POA: Diagnosis not present

## 2015-10-27 DIAGNOSIS — I10 Essential (primary) hypertension: Secondary | ICD-10-CM | POA: Diagnosis not present

## 2015-10-27 DIAGNOSIS — Z951 Presence of aortocoronary bypass graft: Secondary | ICD-10-CM | POA: Diagnosis not present

## 2015-10-27 MED ORDER — CARVEDILOL 25 MG PO TABS: 25.0000 mg | ORAL_TABLET | Freq: Two times a day (BID) | ORAL | Status: DC

## 2015-10-27 MED ORDER — LISINOPRIL 10 MG PO TABS: 10.0000 mg | ORAL_TABLET | Freq: Two times a day (BID) | ORAL | Status: DC

## 2015-10-27 NOTE — Patient Instructions (Signed)
We will be checking the following labs today - NONE   Medication Instructions:    Continue with your current medicines. BUT  I am increasing the Lisinopril to 10 mg to take twice a day - I am giving you a RX   I am increasing the Coreg to 25 mg to take twice a day - take 2 of your 12.5 mg tablets twice a day to use up - I have given you a RX for the 25 mg.     Testing/Procedures To Be Arranged:  N/A  Follow-Up:   See me in 3 weeks - with BMET    Other Special Instructions:   Continue to monitor the BP  Bring your cuff in for me to check    If you need a refill on your cardiac medications before your next appointment, please call your pharmacy.   Call the [place] office at [phone] if you have any questions, problems or concerns.

## 2015-10-27 NOTE — Progress Notes (Signed)
CARDIOLOGY OFFICE NOTE  Date:  10/27/2015    [name] Date of Birth: 11-13-1950 Medical Record [id number]  PCP:  Donnie Coffin, MD  Cardiologist:  Burt Knack    Chief Complaint  Patient presents with  . Hypertension    2 week check - seen for Dr. Burt Knack  . Coronary Artery Disease    History of Present Illness: [name] is a 65 y.o. male who presents today for a 2 week check. Seen for Dr. Burt Knack.   Former patient of Dr. Susa Simmonds. He has an extensive history of PVD and CAD. He was previously followed by Dr. Bridgett Larsson at [facility] but now seeing Vascular in [facility] with Dr. Sammuel Hines. Prior cath in 2006 showing normal LV function with a totally occluded RCA with left to right collaterals. He has distal small vessel disease in the RCA with diffuse mild to moderate stenosis in the LAD. Former smoker. His PVD history is quite extensive and includes AAA (with no plans to intervene due to the nature of his aneurysm and his multiple comorbidities).   Other problems include HTN, HLD and OA. He has a history of hyponatremia as well.  He presented back in July of 2016 with chest pain/NSTEMI and underwent CABG x 5 by Dr. Cyndia Bent. He had vein harvesting from the right lower extremity. He then presented with progressive right lower extremity swelling and discomfort. He followed up with the cardiac surgery office on 8/2 and was started on Lasix and potassium for bilateral lower extremity edema. He fell at home and he was on the floor for at least 2 hours before his wife found him and she brought him back to the ER.   He was found to have acute RLE DVT and PE. Was on IV heparin, transitioned to Xarelto due to drop in platelets, HIT panel + and serotonin release assay + as well and he was changed to Arixtra and seen by Dr. Alen Blew. Warfarin started and he is to be on 6 months of therapy. He did require transfusion of 2 units PRBCs.   I have seen him several times post op - little puny and slow to  improve. Was still having considerable right leg pain - I put him on Ultram. Plavix stopped.   I saw him in early September of 2016 - he looked better but still with considerable pain in the right foot - most likely multifactorial - arterial/recent DVT/venous insufficiency. Was encouraged to see Dr. Sammuel Hines by [facility] but this is a pretty big ordeal to go to [facility] and [place] felt that since there "was nothing to do then not to go". He was a little depressed. Had stopped his alcohol intake.   At his follow up visit in October of 2016 - he was much better - really back to his baseline. Had been back to [facility] - aneurysm had grown - but still no plan to intervene. Was told to "go live his life". Anxious to get back to some traveling within the [place]. He had been released from hematology - was to stop coumadin after total 6 months of therapy.   I last saw him in January. Was to stop Coumadin on March 1st and was going to transition back to Plavix afterwards. Cardiac status was stable but was having allergic/rash symptoms. They were headed to [place] in February.   Seen back 2 weeks ago. Weight was up. He has been on steroids and eating "everything".  BP sky high. I  increased his medicines. Had already changed back to Coreg from Metoprolol. He was back drinking alcohol. ? How much salt he uses.   He comes in today. Here with his wife. Says he feels ok - "back to his grumpy self". No more headaches. BP improved at home but still not at goal. No chest pain. Back drinking at least a 6 pack of beer each day.    Past Medical History  Diagnosis Date  . Hyperlipidemia   . Coronary artery disease   . AAA (abdominal aortic aneurysm) ([place])   . PAD (peripheral artery disease) ([place])   . COPD (chronic obstructive pulmonary disease) ([place])   . Raynaud's disease /phenomenon   . Lumbar degenerative disc disease   . Stroke ([place]) 10/15/2005  . PONV (postoperative nausea and vomiting)   . Hypertension     takes meds daily  .  Anxiety   . GERD (gastroesophageal reflux disease)   . History of blood transfusion   . Hyponatremia   . Myocardial infarction ([place])   . CHF (congestive heart failure) [facility])     Past Surgical History  Procedure Laterality Date  . Iliac artery stent  10/15/2005     Left common and external   . Cardiac catheterization  2006  . Carotid endarterectomy      bilateral  . Carotid endarterectomy  04/13/2005    Right and 06/03/2005 Left  . Abdominal angiogram  03/30/2012  . Hernia repair    . Abdominal aortagram N/A 03/30/2012    Procedure: ABDOMINAL Maxcine Ham;  Surgeon: Conrad Elkins, MD;  Location: [facility] CATH LAB;  Service: Cardiovascular;  Laterality: N/A;  . Cardiac catheterization N/A 12/09/2014    Procedure: Left Heart Cath and Coronary Angiography;  Surgeon: [name], MD;  Location: [facility] CV LAB;  Service: Cardiovascular;  Laterality: N/A;  . Coronary artery bypass graft N/A 12/13/2014    Procedure: CORONARY ARTERY BYPASS GRAFT times five using left internal mammary artery and endoscopic right leg saphenous vein harvest;  Surgeon: Gaye Pollack, MD;  Location: [facility] OR;  Service: Open Heart Surgery;  Laterality: N/A;  . Intraoperative transesophageal echocardiogram N/A 12/13/2014    Procedure: INTRAOPERATIVE TRANSESOPHAGEAL ECHOCARDIOGRAM;  Surgeon: Gaye Pollack, MD;  Location: [facility] OR;  Service: Open Heart Surgery;  Laterality: N/A;     Medications: Current Outpatient Prescriptions  Medication Sig Dispense Refill  . acetaminophen (TYLENOL) 325 MG tablet Take 650 mg by mouth every 6 (six) hours as needed for mild pain or headache. Reported on 07/22/2015    . aspirin 81 MG tablet Take 81 mg by mouth every evening.     . clobetasol ointment (TEMOVATE) 0.05 % apply to affected area twice a day if needed  0  . clopidogrel (PLAVIX) 75 MG tablet Take 1 tablet (75 mg total) by mouth daily. 90 tablet 3  . folic acid (FOLVITE) 1 MG tablet Take 1 tablet (1 mg total) by mouth daily. 30 tablet 0   . levocetirizine (XYZAL) 5 MG tablet TAKE ONE TABLET ONCE DAILY AS DIRECTED 30 tablet 5  . lisinopril (PRINIVIL,ZESTRIL) 10 MG tablet Take 1 tablet (10 mg total) by mouth 2 (two) times daily. 60 tablet 3  . NITROSTAT 0.4 MG SL tablet Place 0.4 mg under the tongue every 5 (five) minutes as needed. Chest pain  0  . ranitidine (ZANTAC) 300 MG tablet take 1 tablet by mouth once daily at bedtime 30 tablet 10  . rosuvastatin (CRESTOR) 20 MG tablet Take 1 tablet (  20 mg total) by mouth at bedtime. 90 tablet 1  . Thiamine HCl (VITAMIN B-1) 100 MG tablet Take 100 mg by mouth daily.      . traMADol (ULTRAM) 50 MG tablet Take 1 tablet (50 mg total) by mouth every 6 (six) hours as needed. 30 tablet 0  . triamcinolone ointment (KENALOG) 0.1 % Apply 1 application topically 2 (two) times daily. 80 g 1  . carvedilol (COREG) 25 MG tablet Take 1 tablet (25 mg total) by mouth 2 (two) times daily. 180 tablet 3   No current facility-administered medications for this visit.    Allergies: Allergies  Allergen Reactions  . Hctz [Hydrochlorothiazide] Other (See Comments)    Black out due to electrolytes  . Norvasc [Amlodipine Besylate] Swelling       . Pletal [Cilostazol] Diarrhea  . Heparin Other (See Comments)    HIT positive as of 12/31/14    Social History: The patient  reports that he quit smoking about 10 years ago. His smoking use included Cigarettes. He has never used smokeless tobacco. He reports that he drinks about 14.4 oz of alcohol per week. He reports that he does not use illicit drugs.   Family History: The patient's family history includes Coronary artery disease (age of onset: 41) in his father; Dementia in his mother; Heart disease in his brother and father; Hyperlipidemia in his brother and father; Hypertension in his brother and father; Other (age of onset: 76) in his mother; Peripheral vascular disease in his father.   Review of Systems: Please see the history of present illness.    Otherwise, the review of systems is positive for none.   All other systems are reviewed and negative.   Physical Exam: VS:  BP 170/120 mmHg  Pulse 72  Ht 6' (1.829 m)  Wt 205 lb 3.2 oz (93.078 kg)  BMI 27.82 kg/m2 .  BMI Body mass index is 27.82 kg/(m^2).  Wt Readings from Last 3 Encounters:  10/27/15 205 lb 3.2 oz (93.078 kg)  10/14/15 207 lb 6.4 oz (94.076 kg)  07/22/15 204 lb 12.9 oz (92.9 kg)    BP recheck by me is 160/80.  General: Pleasant. He is alert and in no acute distress.  HEENT: Normal. Neck: Supple, no JVD, carotid bruits, or masses noted.  Cardiac: Regular rate and rhythm. No murmurs, rubs, or gallops. No edema.  Respiratory:  Lungs are clear to auscultation bilaterally with normal work of breathing.  GI: Soft and nontender.  MS: No deformity or atrophy. Gait and ROM intact. Skin: Warm and dry. Color is normal.  Neuro:  Strength and sensation are intact and no gross focal deficits noted.  Psych: Alert, appropriate and with normal affect.   LABORATORY DATA:  EKG:  EKG is not ordered today.  Lab Results  Component Value Date   WBC 8.2 10/14/2015   HGB 12.7* 10/14/2015   HCT 36.1* 10/14/2015   PLT 228 10/14/2015   GLUCOSE 92 10/14/2015   CHOL 135 06/11/2015   TRIG 52 06/11/2015   HDL 69 06/11/2015   LDLCALC 56 06/11/2015   ALT 9 06/11/2015   AST 17 06/11/2015   NA 133* 10/14/2015   K 4.7 10/14/2015   CL 98 10/14/2015   CREATININE 0.97 10/14/2015   BUN 17 10/14/2015   CO2 27 10/14/2015   TSH 0.60 08/05/2015   INR 1.8 07/14/2015   HGBA1C 5.4 12/12/2014    BNP (last 3 results)  Recent Labs  12/27/14 1028  BNP 568.1*  ProBNP (last 3 results)  Recent Labs  11/08/14 1544  PROBNP 179.0*     Other Studies Reviewed Today:   TEE Study Conclusions from 11/2014  - Left ventricle: Systolic function was mildly reduced. The  estimated ejection fraction was in the range of 45% to 50%.  Hypokinesis of the inferior myocardium. - Aortic  valve: No evidence of vegetation. There was trivial  regurgitation. - Mitral valve: Systolic bowing without prolapse. There was mild  regurgitation. - Left atrium: No evidence of thrombus in the atrial cavity or  appendage. No evidence of thrombus in the appendage. - Atrial septum: No defect or patent foramen ovale was identified. - Tricuspid valve: No evidence of vegetation.   Assessment/Plan:  Acute RLE DVT and PE - treated with 6 months of Coumadin - this was stopped March 1st, 2017. Did have + HIT panel. No longer seeing Dr. Alen Blew.   Anemia-post op blood loss anemia - had previously stopped his iron due to side effects - last check showing slow improvement  Thrombocytopenia - Heparin induced clinically and HIT Ab panel positive - Serotonin release assay positive as well and hence HIT confirmed - Heparin is listed as an allergy for him   Essential hypertension, benign - BP still not controlled - increasing Coreg to 25 mg BID (he had been on 37.5 BID in the past) and increasing the Lisinopril to 10 mg BID (had been on 20 mg BID in the past). To monitor BP at home.    CAD/recent NSTEMI/S/P CABG x 5 (12/13/14) - Continue beta blocker, crestor. Back on Plavix. No symptoms.    PAD  -with prior right foot pain - most likely multifactorial - much better - no longer taking pain medicines.    AAA  -Very complex and due to degree of calcification has been deemed not electively operable -Followed by Dr. Sammuel Hines at [facility]  Rash/allergic reaction .   Current medicines are reviewed with the patient today.  The patient does not have concerns regarding medicines other than what has been noted above.  The following changes have been made:  See above.  Labs/ tests ordered today include:   No orders of the defined types were placed in this encounter.     Disposition:   FU with me in 3 weeks with BMET.    Patient is agreeable to this plan and will call if any problems  develop in the interim.   Signed: Burtis Junes, RN, ANP-C 10/27/2015 11:41 AM  [facility] [address] [place], [place]  [address] Phone: [phone] Fax: [phone]

## 2015-11-04 ENCOUNTER — Ambulatory Visit: Payer: Medicare Other | Admitting: Nurse Practitioner

## 2015-11-18 ENCOUNTER — Encounter: Payer: Self-pay | Admitting: Nurse Practitioner

## 2015-11-18 ENCOUNTER — Ambulatory Visit (INDEPENDENT_AMBULATORY_CARE_PROVIDER_SITE_OTHER): Payer: Medicare Other | Admitting: Nurse Practitioner

## 2015-11-18 VITALS — BP 172/90 | HR 64 | Ht 71.0 in | Wt 209.1 lb

## 2015-11-18 DIAGNOSIS — E785 Hyperlipidemia, unspecified: Secondary | ICD-10-CM

## 2015-11-18 DIAGNOSIS — I739 Peripheral vascular disease, unspecified: Secondary | ICD-10-CM | POA: Diagnosis not present

## 2015-11-18 DIAGNOSIS — I25810 Atherosclerosis of coronary artery bypass graft(s) without angina pectoris: Secondary | ICD-10-CM

## 2015-11-18 DIAGNOSIS — I10 Essential (primary) hypertension: Secondary | ICD-10-CM | POA: Diagnosis not present

## 2015-11-18 MED ORDER — LISINOPRIL 20 MG PO TABS: 20.0000 mg | ORAL_TABLET | Freq: Two times a day (BID) | ORAL | Status: DC

## 2015-11-18 NOTE — Patient Instructions (Addendum)
We will be checking the following labs today - NONE   Medication Instructions:    Continue with your current medicines. BUT  I am increasing the Lisinopril 20 twice a day - take 2 of your 10 mg tablets twice a day and use up - the RX for the 20 mg tablet is at the drug store.     Testing/Procedures To Be Arranged:  N/A  Follow-Up:   See me in 3 months    Other Special Instructions:   N/A    If you need a refill on your cardiac medications before your next appointment, please call your pharmacy.   Call the [place] office at [phone] if you have any questions, problems or concerns.

## 2015-11-18 NOTE — Progress Notes (Signed)
CARDIOLOGY OFFICE NOTE  Date:  11/18/2015    [name] Date of Birth: [birth year] Medical Record [id number]  PCP:  Donnie Coffin, MD  Cardiologist:  Jerel Shepherd    Chief Complaint  Patient presents with  . Coronary Artery Disease  . Hypertension  . PAD    3 week check - seen for Dr. Burt Knack    History of Present Illness: [name] is a 65 y.o. male who presents today for a 3 week check. Seen for Dr. Burt Knack. Former patient of Dr. Susa Simmonds.   He has an extensive history of PVD and CAD. He was previously followed by Dr. Bridgett Larsson at [facility] but now seeing Vascular in [facility] with Dr. Sammuel Hines. Prior cath in 2006 showing normal LV function with a totally occluded RCA with left to right collaterals. He has distal small vessel disease in the RCA with diffuse mild to moderate stenosis in the LAD. Former smoker. His PVD history is quite extensive and includes AAA (with no plans to intervene due to the nature of his aneurysm and his multiple comorbidities).   Other problems include HTN, HLD and OA. He has a history of hyponatremia as well.  He presented back in July of 2016 with chest pain/NSTEMI and underwent CABG x 5 by Dr. Cyndia Bent. He had vein harvesting from the right lower extremity. He then presented with progressive right lower extremity swelling and discomfort. He followed up with the cardiac surgery office on 8/2 and was started on Lasix and potassium for bilateral lower extremity edema. He fell at home and he was on the floor for at least 2 hours before his wife found him and she brought him back to the ER.   He was found to have acute RLE DVT and PE. Was on IV heparin, transitioned to Xarelto due to drop in platelets, HIT panel + and serotonin release assay + as well and he was changed to Arixtra and seen by Dr. Alen Blew. Warfarin started and he is to be on 6 months of therapy. He did require transfusion of 2 units PRBCs.   I have seen him several times post op - little  puny and slow to improve. Was still having considerable right leg pain - I put him on Ultram. Plavix stopped.   I saw him in early September of 2016 - he looked better but still with considerable pain in the right foot - most likely multifactorial - arterial/recent DVT/venous insufficiency. Was encouraged to see Dr. Sammuel Hines by [facility] but this is a pretty big ordeal to go to [facility] and [place] felt that since there "was nothing to do then not to go". He was a little depressed. Had stopped his alcohol intake.   At his follow up visit in October of 2016 - he was much better - really back to his baseline. Had been back to [facility] - aneurysm had grown - but still no plan to intervene. Was told to "go live his life". Anxious to get back to some traveling within the [place]. He had been released from hematology - was to stop coumadin after total 6 months of therapy.   Coumadin was stopped on March 1st and he was transitioned back to Plavix afterwards.   Seen several times this past spring - weight was up. He had been on steroids for allergic reaction and eating "everything". BP sky high. I increased his medicines. Changed back to Coreg from Metoprolol. He was back drinking alcohol. ? How  much salt he uses. At last visit 3 weeks ago, BP improving but still not at goal.   He comes in today. Here with his wife. BP cuff matches up pretty well. Weight is up 4 more pounds - eating "fish and chips" every day. He feels ok. No chest pain. Breathing stable. Tolerating his medicines. They are wanting to plan a trip to [facility] in January/February - willing to "take their chances".   Past Medical History  Diagnosis Date  . Hyperlipidemia   . Coronary artery disease   . AAA (abdominal aortic aneurysm) ([place])   . PAD (peripheral artery disease) ([place])   . COPD (chronic obstructive pulmonary disease) ([place])   . Raynaud's disease /phenomenon   . Lumbar degenerative disc disease   . Stroke ([place]) 10/15/2005  . PONV (postoperative nausea and  vomiting)   . Hypertension     takes meds daily  . Anxiety   . GERD (gastroesophageal reflux disease)   . History of blood transfusion   . Hyponatremia   . Myocardial infarction ([place])   . CHF (congestive heart failure) [facility])     Past Surgical History  Procedure Laterality Date  . Iliac artery stent  10/15/2005     Left common and external   . Cardiac catheterization  2006  . Carotid endarterectomy      bilateral  . Carotid endarterectomy  04/13/2005    Right and 06/03/2005 Left  . Abdominal angiogram  03/30/2012  . Hernia repair    . Abdominal aortagram N/A 03/30/2012    Procedure: ABDOMINAL Maxcine Ham;  Surgeon: Conrad , MD;  Location: [facility] CATH LAB;  Service: Cardiovascular;  Laterality: N/A;  . Cardiac catheterization N/A 12/09/2014    Procedure: Left Heart Cath and Coronary Angiography;  Surgeon: [name], MD;  Location: [facility] CV LAB;  Service: Cardiovascular;  Laterality: N/A;  . Coronary artery bypass graft N/A 12/13/2014    Procedure: CORONARY ARTERY BYPASS GRAFT times five using left internal mammary artery and endoscopic right leg saphenous vein harvest;  Surgeon: Gaye Pollack, MD;  Location: [facility] OR;  Service: Open Heart Surgery;  Laterality: N/A;  . Intraoperative transesophageal echocardiogram N/A 12/13/2014    Procedure: INTRAOPERATIVE TRANSESOPHAGEAL ECHOCARDIOGRAM;  Surgeon: Gaye Pollack, MD;  Location: [facility] OR;  Service: Open Heart Surgery;  Laterality: N/A;     Medications: Current Outpatient Prescriptions  Medication Sig Dispense Refill  . acetaminophen (TYLENOL) 325 MG tablet Take 650 mg by mouth every 6 (six) hours as needed for mild pain or headache. Reported on 07/22/2015    . aspirin 81 MG tablet Take 81 mg by mouth every evening.     . carvedilol (COREG) 25 MG tablet Take 1 tablet (25 mg total) by mouth 2 (two) times daily. 180 tablet 3  . clobetasol ointment (TEMOVATE) 0.05 % apply to affected area twice a day if needed  0  . clopidogrel (PLAVIX)  75 MG tablet Take 1 tablet (75 mg total) by mouth daily. 90 tablet 3  . folic acid (FOLVITE) 1 MG tablet Take 1 tablet (1 mg total) by mouth daily. 30 tablet 0  . levocetirizine (XYZAL) 5 MG tablet TAKE ONE TABLET ONCE DAILY AS DIRECTED 30 tablet 5  . NITROSTAT 0.4 MG SL tablet Place 0.4 mg under the tongue every 5 (five) minutes as needed. Chest pain  0  . ranitidine (ZANTAC) 300 MG tablet take 1 tablet by mouth once daily at bedtime 30 tablet 10  . rosuvastatin (CRESTOR) 20  MG tablet Take 1 tablet (20 mg total) by mouth at bedtime. 90 tablet 1  . Thiamine HCl (VITAMIN B-1) 100 MG tablet Take 100 mg by mouth daily.      . traMADol (ULTRAM) 50 MG tablet Take 1 tablet (50 mg total) by mouth every 6 (six) hours as needed. 30 tablet 0  . triamcinolone ointment (KENALOG) 0.1 % Apply 1 application topically 2 (two) times daily. 80 g 1  . lisinopril (PRINIVIL,ZESTRIL) 20 MG tablet Take 1 tablet (20 mg total) by mouth 2 (two) times daily. 180 tablet 3   No current facility-administered medications for this visit.    Allergies: Allergies  Allergen Reactions  . Hctz [Hydrochlorothiazide] Other (See Comments)    Black out due to electrolytes  . Norvasc [Amlodipine Besylate] Swelling       . Pletal [Cilostazol] Diarrhea  . Heparin Other (See Comments)    HIT positive as of 12/31/14    Social History: The patient  reports that he quit smoking about 10 years ago. His smoking use included Cigarettes. He has never used smokeless tobacco. He reports that he drinks about 14.4 oz of alcohol per week. He reports that he does not use illicit drugs.   Family History: The patient's family history includes Coronary artery disease (age of onset: 19) in his father; Dementia in his mother; Heart disease in his brother and father; Hyperlipidemia in his brother and father; Hypertension in his brother and father; Other (age of onset: 49) in his mother; Peripheral vascular disease in his father.   Review of  Systems: Please see the history of present illness.   Otherwise, the review of systems is positive for none.   All other systems are reviewed and negative.   Physical Exam: VS:  BP 172/90 mmHg  Pulse 64  Ht 5\' 11"  (1.803 m)  Wt 209 lb 1.9 oz (94.856 kg)  BMI 29.18 kg/m2 .  BMI Body mass index is 29.18 kg/(m^2).  Wt Readings from Last 3 Encounters:  11/18/15 209 lb 1.9 oz (94.856 kg)  10/27/15 205 lb 3.2 oz (93.078 kg)  10/14/15 207 lb 6.4 oz (94.076 kg)    General: Pleasant. Well developed, well nourished and in no acute distress. His weight is up 4 more pounds.   HEENT: Normal. Neck: Supple, no JVD, carotid bruits, or masses noted.  Cardiac: Regular rate and rhythm. No murmurs, rubs, or gallops. No edema.  Respiratory:  Lungs are clear to auscultation bilaterally with normal work of breathing.  GI: Soft and nontender.  MS: No deformity or atrophy. Gait and ROM intact. Skin: Warm and dry. Color is normal.  Neuro:  Strength and sensation are intact and no gross focal deficits noted.  Psych: Alert, appropriate and with normal affect.   LABORATORY DATA:  EKG:  EKG is not ordered today.  Lab Results  Component Value Date   WBC 8.2 10/14/2015   HGB 12.7* 10/14/2015   HCT 36.1* 10/14/2015   PLT 228 10/14/2015   GLUCOSE 92 10/14/2015   CHOL 135 06/11/2015   TRIG 52 06/11/2015   HDL 69 06/11/2015   LDLCALC 56 06/11/2015   ALT 9 06/11/2015   AST 17 06/11/2015   NA 133* 10/14/2015   K 4.7 10/14/2015   CL 98 10/14/2015   CREATININE 0.97 10/14/2015   BUN 17 10/14/2015   CO2 27 10/14/2015   TSH 0.60 08/05/2015   INR 1.8 07/14/2015   HGBA1C 5.4 12/12/2014    BNP (last 3 results)  Recent Labs  12/27/14 1028  BNP 568.1*    ProBNP (last 3 results) No results for input(s): PROBNP in the last 8760 hours.   Other Studies Reviewed Today:  TEE Study Conclusions from 11/2014  - Left ventricle: Systolic function was mildly reduced. The  estimated ejection fraction  was in the range of 45% to 50%.  Hypokinesis of the inferior myocardium. - Aortic valve: No evidence of vegetation. There was trivial  regurgitation. - Mitral valve: Systolic bowing without prolapse. There was mild  regurgitation. - Left atrium: No evidence of thrombus in the atrial cavity or  appendage. No evidence of thrombus in the appendage. - Atrial septum: No defect or patent foramen ovale was identified. - Tricuspid valve: No evidence of vegetation.   Assessment/Plan:  Prior RLE DVT and PE - treated with 6 months of Coumadin - this was stopped March 1st, 2017. Did have + HIT panel. No longer seeing Dr. Alen Blew.   Thrombocytopenia - Heparin induced clinically and HIT Ab panel positive - Serotonin release assay positive as well and hence HIT confirmed - Heparin is listed as an allergy for him   Essential hypertension, benign - BP still not controlled - but is improving. I have left him on Coreg to 25 mg BID (he had been on 37.5 BID in the past) and increasing the Lisinopril to 20 mg BID (this is his old dose). To monitor BP at home.    CAD/recent NSTEMI/S/P CABG x 5 (12/13/14) - Continue beta blocker, crestor. Back on Plavix. No symptoms.    PAD  -with prior right foot pain - most likely multifactorial - much better - no longer taking pain medicines.    AAA  -Very complex and due to degree of calcification has been deemed not electively operable -Followed by Dr. Sammuel Hines at [facility]  Current medicines are reviewed with the patient today.  The patient does not have concerns regarding medicines other than what has been noted above.  The following changes have been made:  See above.  Labs/ tests ordered today include:   No orders of the defined types were placed in this encounter.     Disposition:   FU with me in 3 months.   Patient is agreeable to this plan and will call if any problems develop in the interim.   Signed: Burtis Junes, RN,  ANP-C 11/18/2015 10:53 AM  [place] [address] [place], [place]  [address] Phone: [phone] Fax: [phone]

## 2016-01-22 ENCOUNTER — Encounter: Payer: Self-pay | Admitting: Nurse Practitioner

## 2016-02-09 ENCOUNTER — Ambulatory Visit: Payer: Medicare Other | Admitting: Nurse Practitioner

## 2016-02-09 NOTE — Progress Notes (Deleted)
CARDIOLOGY OFFICE NOTE  Date:  02/09/2016    [name] Date of Birth: [birth year] Medical Record #[id number]  PCP:  Donnie Coffin, MD  Cardiologist:  Jerel Shepherd    No chief complaint on file.   History of Present Illness: [name] is a 65 y.o. male who presents today for a 3 month check. Seen for Dr. Burt Knack. Former patient of Dr. Susa Simmonds.   He has an extensive history of PVD and CAD. He was previously followed by [facility]. Bridgett Larsson at [facility] but now seeing Vascular in [facility] with Dr. Sammuel Hines. Prior cath in 2006 showing normal LV function with a totally occluded RCA with left to right collaterals. He has distal small vessel disease in the RCA with diffuse mild to moderate stenosis in the LAD. Former smoker. His PVD history is quite extensive and includes AAA (with no plans to intervene due to the nature of his aneurysm and his multiple comorbidities).   Other problems include HTN, HLD and OA. He has a history of hyponatremia as well.  He presented back in July of 2016 with chest pain/NSTEMI and underwent CABG x 5 by Dr. Cyndia Bent. He had vein harvesting from the right lower extremity. He then presented with progressive right lower extremity swelling and discomfort. He followed up with the cardiac surgery office on 8/2 and was started on Lasix and potassium for bilateral lower extremity edema. He fell at home and he was on the floor for at least 2 hours before his wife found him and she brought him back to the ER.   He was found to have acute RLE DVT and PE. Was on IV heparin, transitioned to Xarelto due to drop in platelets, HIT panel + and serotonin release assay + as well and he was changed to Arixtra and seen by Dr. Alen Blew. Warfarin started and he is to be on 6 months of therapy. He did require transfusion of 2 units PRBCs.   I have seen him several times post op - little puny and slow to improve. Was still having considerable right leg pain - I put him on Ultram.  Plavix stopped.   I saw him in early September of 2016 - he looked better but still with considerable pain in the right foot - most likely multifactorial - arterial/recent DVT/venous insufficiency. Was encouraged to see Dr. Sammuel Hines by [facility] but this is a pretty big ordeal to go to [facility] and [place] felt that since there "was nothing to do then not to go". He was a little depressed. Had stopped his alcohol intake.   At his follow up visit in October of 2016 - he was much better - really back to his baseline. Had been back to [facility] - aneurysm had grown - but still no plan to intervene. Was told to "go live his life". Anxious to get back to some traveling within the [place]. He had been released from hematology - was to stop coumadin after total 6 months of therapy.   Coumadin was stopped on March 1st and he was transitioned back to Plavix afterwards.   Seen several times this past spring - weight was up. He had been on steroids for allergic reaction and eating "everything". BP sky high. I increased his medicines. Changed back to Coreg from Metoprolol. He was back drinking alcohol. ? How much salt he uses. At last visit back in June he was doing ok. They were planning a trip to [place] - "willing to  take their changes".    He comes in today. Here with his wife.   Past Medical History:  Diagnosis Date  . AAA (abdominal aortic aneurysm) ([place])   . Anxiety   . CHF (congestive heart failure) ([place])   . COPD (chronic obstructive pulmonary disease) ([place])   . Coronary artery disease   . GERD (gastroesophageal reflux disease)   . History of blood transfusion   . Hyperlipidemia   . Hypertension    takes meds daily  . Hyponatremia   . Lumbar degenerative disc disease   . Myocardial infarction ([place])   . PAD (peripheral artery disease) ([place])   . PONV (postoperative nausea and vomiting)   . Raynaud's disease /phenomenon   . Stroke [facility]) 10/15/2005    Past Surgical History:  Procedure Laterality Date  .  ABDOMINAL ANGIOGRAM  03/30/2012  . ABDOMINAL AORTAGRAM N/A 03/30/2012   Procedure: ABDOMINAL Maxcine Ham;  Surgeon: Conrad Naranjito, MD;  Location: [facility] CATH LAB;  Service: Cardiovascular;  Laterality: N/A;  . CARDIAC CATHETERIZATION  2006  . CARDIAC CATHETERIZATION N/A 12/09/2014   Procedure: Left Heart Cath and Coronary Angiography;  Surgeon: [name], MD;  Location: [place] CV LAB;  Service: Cardiovascular;  Laterality: N/A;  . CAROTID ENDARTERECTOMY     bilateral  . CAROTID ENDARTERECTOMY  04/13/2005   Right and 06/03/2005 Left  . CORONARY ARTERY BYPASS GRAFT N/A 12/13/2014   Procedure: CORONARY ARTERY BYPASS GRAFT times five using left internal mammary artery and endoscopic right leg saphenous vein harvest;  Surgeon: Gaye Pollack, MD;  Location: [place] OR;  Service: Open Heart Surgery;  Laterality: N/A;  . HERNIA REPAIR    . ILIAC ARTERY STENT  10/15/2005    Left common and external   . INTRAOPERATIVE TRANSESOPHAGEAL ECHOCARDIOGRAM N/A 12/13/2014   Procedure: INTRAOPERATIVE TRANSESOPHAGEAL ECHOCARDIOGRAM;  Surgeon: Gaye Pollack, MD;  Location: [facility] OR;  Service: Open Heart Surgery;  Laterality: N/A;     Medications: Current Outpatient Prescriptions  Medication Sig Dispense Refill  . acetaminophen (TYLENOL) 325 MG tablet Take 650 mg by mouth every 6 (six) hours as needed for mild pain or headache. Reported on 07/22/2015    . aspirin 81 MG tablet Take 81 mg by mouth every evening.     . carvedilol (COREG) 25 MG tablet Take 1 tablet (25 mg total) by mouth 2 (two) times daily. 180 tablet 3  . clobetasol ointment (TEMOVATE) 0.05 % apply to affected area twice a day if needed  0  . clopidogrel (PLAVIX) 75 MG tablet Take 1 tablet (75 mg total) by mouth daily. 90 tablet 3  . folic acid (FOLVITE) 1 MG tablet Take 1 tablet (1 mg total) by mouth daily. 30 tablet 0  . levocetirizine (XYZAL) 5 MG tablet TAKE ONE TABLET ONCE DAILY AS DIRECTED 30 tablet 5  . lisinopril (PRINIVIL,ZESTRIL) 20 MG tablet  Take 1 tablet (20 mg total) by mouth 2 (two) times daily. 180 tablet 3  . NITROSTAT 0.4 MG SL tablet Place 0.4 mg under the tongue every 5 (five) minutes as needed. Chest pain  0  . ranitidine (ZANTAC) 300 MG tablet take 1 tablet by mouth once daily at bedtime 30 tablet 10  . rosuvastatin (CRESTOR) 20 MG tablet Take 1 tablet (20 mg total) by mouth at bedtime. 90 tablet 1  . Thiamine HCl (VITAMIN B-1) 100 MG tablet Take 100 mg by mouth daily.      . traMADol (ULTRAM) 50 MG tablet Take 1 tablet (50 mg  total) by mouth every 6 (six) hours as needed. 30 tablet 0  . triamcinolone ointment (KENALOG) 0.1 % Apply 1 application topically 2 (two) times daily. 80 g 1   No current facility-administered medications for this visit.     Allergies: Allergies  Allergen Reactions  . Hctz [Hydrochlorothiazide] Other (See Comments)    Black out due to electrolytes  . Norvasc [Amlodipine Besylate] Swelling       . Pletal [Cilostazol] Diarrhea  . Heparin Other (See Comments)    HIT positive as of 12/31/14    Social History: The patient  reports that he quit smoking about 10 years ago. His smoking use included Cigarettes. He has never used smokeless tobacco. He reports that he drinks about 14.4 oz of alcohol per week . He reports that he does not use drugs.   Family History: The patient's family history includes Coronary artery disease (age of onset: 80) in his father; Dementia in his mother; Heart disease in his brother and father; Hyperlipidemia in his brother and father; Hypertension in his brother and father; Other (age of onset: 59) in his mother; Peripheral vascular disease in his father.   Review of Systems: Please see the history of present illness.   Otherwise, the review of systems is positive for none.   All other systems are reviewed and negative.   Physical Exam: VS:  There were no vitals taken for this visit. Marland Kitchen  BMI There is no height or weight on file to calculate BMI.  Wt Readings from Last  3 Encounters:  11/18/15 209 lb 1.9 oz (94.9 kg)  10/27/15 205 lb 3.2 oz (93.1 kg)  10/14/15 207 lb 6.4 oz (94.1 kg)    General: Pleasant. Well developed, well nourished and in no acute distress.   HEENT: Normal.  Neck: Supple, no JVD, carotid bruits, or masses noted.  Cardiac: Regular rate and rhythm. No murmurs, rubs, or gallops. No edema.  Respiratory:  Lungs are clear to auscultation bilaterally with normal work of breathing.  GI: Soft and nontender.  MS: No deformity or atrophy. Gait and ROM intact.  Skin: Warm and dry. Color is normal.  Neuro:  Strength and sensation are intact and no gross focal deficits noted.  Psych: Alert, appropriate and with normal affect.   LABORATORY DATA:  EKG:  EKG is ordered today. This demonstrates .  Lab Results  Component Value Date   WBC 8.2 10/14/2015   HGB 12.7 (L) 10/14/2015   HCT 36.1 (L) 10/14/2015   PLT 228 10/14/2015   GLUCOSE 92 10/14/2015   CHOL 135 06/11/2015   TRIG 52 06/11/2015   HDL 69 06/11/2015   LDLCALC 56 06/11/2015   ALT 9 06/11/2015   AST 17 06/11/2015   NA 133 (L) 10/14/2015   K 4.7 10/14/2015   CL 98 10/14/2015   CREATININE 0.97 10/14/2015   BUN 17 10/14/2015   CO2 27 10/14/2015   TSH 0.60 08/05/2015   INR 1.8 07/14/2015   HGBA1C 5.4 12/12/2014    BNP (last 3 results) No results for input(s): BNP in the last 8760 hours.  ProBNP (last 3 results) No results for input(s): PROBNP in the last 8760 hours.   Other Studies Reviewed Today:  TEE Study Conclusions from 11/2014  - Left ventricle: Systolic function was mildly reduced. The  estimated ejection fraction was in the range of 45% to 50%.  Hypokinesis of the inferior myocardium. - Aortic valve: No evidence of vegetation. There was trivial  regurgitation. - Mitral  valve: Systolic bowing without prolapse. There was mild  regurgitation. - Left atrium: No evidence of thrombus in the atrial cavity or  appendage. No evidence of thrombus in the  appendage. - Atrial septum: No defect or patent foramen ovale was identified. - Tricuspid valve: No evidence of vegetation.   Assessment/Plan:  Prior RLE DVT and PE - treated with 6 months of Coumadin - this was stopped March 1st, 2017. Did have + HIT panel. No longer seeing Dr. Alen Blew.   Thrombocytopenia - Heparin induced clinically and HIT Ab panel positive - Serotonin release assay positive as well and hence HIT confirmed - Heparin is listed as an allergy for him   Essential hypertension, benign - BP still not controlled - but is improving. I have left him on Coreg to 25 mg BID (he had been on 37.5 BID in the past) and increasing the Lisinopril to 20 mg BID (this is his old dose). To monitor BP at home.    CAD/recent NSTEMI/S/P CABG x 5 (12/13/14) - Continue beta blocker, crestor. Back on Plavix. No symptoms.    PAD  -with prior right foot pain - most likely multifactorial - much better - no longer taking pain medicines.    AAA  -Very complex and due to degree of calcification has been deemed not electively operable -Followed by Dr. Sammuel Hines at [facility]  Current medicines are reviewed with the patient today.  The patient does not have concerns regarding medicines other than what has been noted above.  The following changes have been made:  See above.  Labs/ tests ordered today include:   No orders of the defined types were placed in this encounter.    Disposition:   FU with me in 4 months.   Patient is agreeable to this plan and will call if any problems develop in the interim.   Signed: Burtis Junes, RN, ANP-C 02/09/2016 7:48 AM  [facility] [address] [place], [place]  [address] Phone: [phone] Fax: [phone]

## 2016-02-10 ENCOUNTER — Telehealth: Payer: Self-pay | Admitting: *Deleted

## 2016-02-10 NOTE — Telephone Encounter (Signed)
-----   Message from Burtis Junes, NP sent at 02/09/2016  8:30 AM EDT ----- Did not show for his visit - can you call them?  lori

## 2016-02-10 NOTE — Telephone Encounter (Signed)
lvm to ask pt why pt cancelled appointment with Truitt Merle, NP.

## 2016-02-10 NOTE — Telephone Encounter (Signed)
S/w pt's wife, wife's dog died.  Pt is rescheduled for next week appointment with Truitt Merle, NP.

## 2016-02-18 ENCOUNTER — Encounter: Payer: Self-pay | Admitting: Nurse Practitioner

## 2016-02-18 ENCOUNTER — Ambulatory Visit (INDEPENDENT_AMBULATORY_CARE_PROVIDER_SITE_OTHER): Payer: Medicare Other | Admitting: Nurse Practitioner

## 2016-02-18 VITALS — BP 200/100 | HR 65 | Ht 71.0 in | Wt 216.0 lb

## 2016-02-18 DIAGNOSIS — E785 Hyperlipidemia, unspecified: Secondary | ICD-10-CM

## 2016-02-18 DIAGNOSIS — I739 Peripheral vascular disease, unspecified: Secondary | ICD-10-CM | POA: Diagnosis not present

## 2016-02-18 DIAGNOSIS — I10 Essential (primary) hypertension: Secondary | ICD-10-CM

## 2016-02-18 DIAGNOSIS — I25810 Atherosclerosis of coronary artery bypass graft(s) without angina pectoris: Secondary | ICD-10-CM | POA: Diagnosis not present

## 2016-02-18 MED ORDER — CLONIDINE HCL 0.1 MG PO TABS: 0.1000 mg | ORAL_TABLET | Freq: Every day | ORAL | 3 refills | Status: DC

## 2016-02-18 NOTE — Progress Notes (Signed)
CARDIOLOGY OFFICE NOTE  Date:  02/18/2016    [name] Date of Birth: Aug 24, 1950 Medical Record #[id number]  PCP:  Donnie Coffin, MD  Cardiologist:  Jerel Shepherd  Chief Complaint  Patient presents with  . Coronary Artery Disease    3 month check - seen for Dr. Burt Knack    History of Present Illness: [name] is a 65 y.o. male who presents today for a follow up visit. Seen for Dr. Burt Knack. Former patient of Dr. Susa Simmonds.   He has an extensive history of PVD and CAD. He was previously followed by Dr. Bridgett Larsson at [facility] but now seeing [facility] in [facility] with Dr. Sammuel Hines. Prior cath in 2006 showing normal LV function with a totally occluded RCA with left to right collaterals. He has distal small vessel disease in the RCA with diffuse mild to moderate stenosis in the LAD. Former smoker. His PVD history is quite extensive and includes AAA (with no plans to intervene due to the nature of his aneurysm and his multiple comorbidities - last noted scan was 5.8 x 5.4 from 02/2015).   Other problems include HTN, HLD and OA. He has a history of hyponatremia as well.  He presented back in July of 2016 with chest pain/NSTEMI and underwent CABG x 5 by Dr. Cyndia Bent. He had vein harvesting from the right lower extremity. He then presented with progressive right lower extremity swelling and discomfort. He followed up with the cardiac surgery office on 8/2 and was started on Lasix and potassium for bilateral lower extremity edema. He fell at home and he was on the floor for at least 2 hours before his wife found him and she brought him back to the ER.   He was found to have acute RLE DVT and PE. Was on IV heparin, transitioned to Xarelto due to drop in platelets, HIT panel + and serotonin release assay + as well and he was changed to Arixtra and seen by Dr. Alen Blew. Warfarin started and he is to be on 6 months of therapy. He did require transfusion of 2 units PRBCs.   I have seen him  several times post op - little puny and slow to improve. Was still having considerable right leg pain - I put him on Ultram. Plavix stopped.   I saw him in early September of 2016 - he looked better but still with considerable pain in the right foot - most likely multifactorial - arterial/recent DVT/venous insufficiency. Was encouraged to see Dr. Sammuel Hines by [facility] but this is a pretty big ordeal to go to [facility] and [place] felt that since there "was nothing to do then not to go". He was a little depressed. Had stopped his alcohol intake.   At his follow up visit in October of 2016 - he was much better - really back to his baseline. Had been back to [facility] - aneurysm had grown - but still no plan to intervene. Was told to "go live his life". Anxious to get back to some traveling within the [place]. He had been released from hematology - was to stop coumadin after total 6 months of therapy. Coumadin was stopped on March 1st and he was transitioned back to Plavix afterwards.   Seen several times this past spring - weight was up. He had been on steroids for allergic reaction and eating "everything". BP sky high. I increased his medicines. Changed back to Coreg from Metoprolol. He was back drinking alcohol. ? How  much salt he uses. Last seen back in June and he was doing ok. His BP cuff matches up pretty well. Planning a  trip to [place] in January/February - willing to "take their chances".   He comes in today. Here with his wife. Their dog has died. His BP is sky high. Eating out most meals. Lots of salt. Weight is up as well. Continues to drink alcohol - does not quantify. Not going to [place] now - but planning to go to [place] in early November. He feels ok. No chest pain. Breathing short if he overdoes. Probably not planning on going back to [facility] - always told "just go do your bucket list". BP up at home but not as high as it is here today.   Past Medical History:  Diagnosis Date  . AAA (abdominal aortic aneurysm)  ([place])   . Anxiety   . CHF (congestive heart failure) ([place])   . COPD (chronic obstructive pulmonary disease) ([place])   . Coronary artery disease   . GERD (gastroesophageal reflux disease)   . History of blood transfusion   . Hyperlipidemia   . Hypertension    takes meds daily  . Hyponatremia   . Lumbar degenerative disc disease   . Myocardial infarction ([place])   . PAD (peripheral artery disease) ([place])   . PONV (postoperative nausea and vomiting)   . Raynaud's disease /phenomenon   . Stroke [facility]) 10/15/2005    Past Surgical History:  Procedure Laterality Date  . ABDOMINAL ANGIOGRAM  03/30/2012  . ABDOMINAL AORTAGRAM N/A 03/30/2012   Procedure: ABDOMINAL Maxcine Ham;  Surgeon: Conrad Funston, MD;  Location: [facility] CATH LAB;  Service: Cardiovascular;  Laterality: N/A;  . CARDIAC CATHETERIZATION  2006  . CARDIAC CATHETERIZATION N/A 12/09/2014   Procedure: Left Heart Cath and Coronary Angiography;  Surgeon: [name], MD;  Location: [place] CV LAB;  Service: Cardiovascular;  Laterality: N/A;  . CAROTID ENDARTERECTOMY     bilateral  . CAROTID ENDARTERECTOMY  04/13/2005   Right and 06/03/2005 Left  . CORONARY ARTERY BYPASS GRAFT N/A 12/13/2014   Procedure: CORONARY ARTERY BYPASS GRAFT times five using left internal mammary artery and endoscopic right leg saphenous vein harvest;  Surgeon: Gaye Pollack, MD;  Location: [place] OR;  Service: Open Heart Surgery;  Laterality: N/A;  . HERNIA REPAIR    . ILIAC ARTERY STENT  10/15/2005    Left common and external   . INTRAOPERATIVE TRANSESOPHAGEAL ECHOCARDIOGRAM N/A 12/13/2014   Procedure: INTRAOPERATIVE TRANSESOPHAGEAL ECHOCARDIOGRAM;  Surgeon: Gaye Pollack, MD;  Location: [facility] OR;  Service: Open Heart Surgery;  Laterality: N/A;     Medications: Current Outpatient Prescriptions  Medication Sig Dispense Refill  . acetaminophen (TYLENOL) 325 MG tablet Take 650 mg by mouth every 6 (six) hours as needed for mild pain or headache. Reported on 07/22/2015      . aspirin 81 MG tablet Take 81 mg by mouth every evening.     . carvedilol (COREG) 25 MG tablet Take 1 tablet (25 mg total) by mouth 2 (two) times daily. 180 tablet 3  . clobetasol ointment (TEMOVATE) 0.05 % apply to affected area twice a day if needed  0  . clopidogrel (PLAVIX) 75 MG tablet Take 1 tablet (75 mg total) by mouth daily. 90 tablet 3  . folic acid (FOLVITE) 1 MG tablet Take 1 tablet (1 mg total) by mouth daily. 30 tablet 0  . levocetirizine (XYZAL) 5 MG tablet TAKE ONE TABLET ONCE DAILY AS DIRECTED  30 tablet 5  . lisinopril (PRINIVIL,ZESTRIL) 20 MG tablet Take 1 tablet (20 mg total) by mouth 2 (two) times daily. 180 tablet 3  . NITROSTAT 0.4 MG SL tablet Place 0.4 mg under the tongue every 5 (five) minutes as needed. Chest pain  0  . ranitidine (ZANTAC) 300 MG tablet take 1 tablet by mouth once daily at bedtime 30 tablet 10  . rosuvastatin (CRESTOR) 20 MG tablet Take 1 tablet (20 mg total) by mouth at bedtime. 90 tablet 1  . Thiamine HCl (VITAMIN B-1) 100 MG tablet Take 100 mg by mouth daily.      . traMADol (ULTRAM) 50 MG tablet Take 1 tablet (50 mg total) by mouth every 6 (six) hours as needed. 30 tablet 0  . triamcinolone ointment (KENALOG) 0.1 % Apply 1 application topically 2 (two) times daily. 80 g 1  . cloNIDine (CATAPRES) 0.1 MG tablet Take 1 tablet (0.1 mg total) by mouth at bedtime. 90 tablet 3   No current facility-administered medications for this visit.     Allergies: Allergies  Allergen Reactions  . Hctz [Hydrochlorothiazide] Other (See Comments)    Black out due to electrolytes  . Norvasc [Amlodipine Besylate] Swelling       . Pletal [Cilostazol] Diarrhea  . Heparin Other (See Comments)    HIT positive as of 12/31/14    Social History: The patient  reports that he quit smoking about 10 years ago. His smoking use included Cigarettes. He has never used smokeless tobacco. He reports that he drinks about 14.4 oz of alcohol per week . He reports that he does not  use drugs.   Family History: The patient's family history includes Coronary artery disease (age of onset: 98) in his father; Dementia in his mother; Heart disease in his brother and father; Hyperlipidemia in his brother and father; Hypertension in his brother and father; Other (age of onset: 43) in his mother; Peripheral vascular disease in his father.   Review of Systems: Please see the history of present illness.   Otherwise, the review of systems is positive for none.   All other systems are reviewed and negative.   Physical Exam: VS:  BP (!) 200/100   Pulse 65   Ht 5\' 11"  (1.803 m)   Wt 216 lb (98 kg)   BMI 30.13 kg/m  .  BMI Body mass index is 30.13 kg/m.  Wt Readings from Last 3 Encounters:  02/18/16 216 lb (98 kg)  11/18/15 209 lb 1.9 oz (94.9 kg)  10/27/15 205 lb 3.2 oz (93.1 kg)    General: Pleasant. He remains obese and his weight continues to climb. He is alert and in no acute distress.   HEENT: Normal.  Neck: Supple, no JVD, carotid bruits, or masses noted.  Cardiac: Regular rate and rhythm. No murmurs, rubs, or gallops. No edema.  Respiratory:  Lungs are clear to auscultation bilaterally with normal work of breathing.  GI: Soft and nontender.  MS: No deformity or atrophy. Gait and ROM intact.  Skin: Warm and dry. Color is normal.  Neuro:  Strength and sensation are intact and no gross focal deficits noted.  Psych: Alert, appropriate and with normal affect.   LABORATORY DATA:  EKG:  EKG is ordered today. This demonstrates NSR with diffuse ST/T wave changes.  Lab Results  Component Value Date   WBC 8.2 10/14/2015   HGB 12.7 (L) 10/14/2015   HCT 36.1 (L) 10/14/2015   PLT 228 10/14/2015   GLUCOSE 92  10/14/2015   CHOL 135 06/11/2015   TRIG 52 06/11/2015   HDL 69 06/11/2015   LDLCALC 56 06/11/2015   ALT 9 06/11/2015   AST 17 06/11/2015   NA 133 (L) 10/14/2015   K 4.7 10/14/2015   CL 98 10/14/2015   CREATININE 0.97 10/14/2015   BUN 17 10/14/2015   CO2 27  10/14/2015   TSH 0.60 08/05/2015   INR 1.8 07/14/2015   HGBA1C 5.4 12/12/2014    BNP (last 3 results) No results for input(s): BNP in the last 8760 hours.  ProBNP (last 3 results) No results for input(s): PROBNP in the last 8760 hours.   Other Studies Reviewed Today:  TEE Study Conclusions from 11/2014  - Left ventricle: Systolic function was mildly reduced. The  estimated ejection fraction was in the range of 45% to 50%.  Hypokinesis of the inferior myocardium. - Aortic valve: No evidence of vegetation. There was trivial  regurgitation. - Mitral valve: Systolic bowing without prolapse. There was mild  regurgitation. - Left atrium: No evidence of thrombus in the atrial cavity or  appendage. No evidence of thrombus in the appendage. - Atrial septum: No defect or patent foramen ovale was identified. - Tricuspid valve: No evidence of vegetation.   Assessment/Plan:  Prior RLE DVT and PE - treated with 6 months of Coumadin - this was stopped March 1st, 2017. Did have + HIT panel. No longer seeing Dr. Alen Blew.   Thrombocytopenia - Heparin induced clinically and HIT Ab panel positive - Serotonin release assay positive as well and hence HIT confirmed - Heparin is listed as an allergy for him   Essential hypertension, benign - BP still not controlled - adding Clonidine 0.1 mg at bedtime. Needs to restrict his salt - unfortunately, I do not see this happening.    CAD/recent NSTEMI/S/P CABG x 5 (12/13/14) - Continue beta blocker, crestor. Back on Plavix. No symptoms.    PAD  -with prior right foot pain - most likely multifactorial - much better - no longer taking pain medicines.    AAA  -Very complex and due to degree of calcification has been deemed not electively operable -Followed by Dr. Sammuel Hines at [facility] - but most likely not going back.   Current medicines are reviewed with the patient today.  The patient does not have concerns regarding medicines  other than what has been noted above.  The following changes have been made:  See above.  Labs/ tests ordered today include:    Orders Placed This Encounter  Procedures  . EKG 12-Lead     Disposition:   FU with me in 2 weeks.   Patient is agreeable to this plan and will call if any problems develop in the interim.   Signed: Burtis Junes, RN, ANP-C 02/18/2016 8:40 AM  [facility] [address] [place], [place]  [address] Phone: [phone] Fax: [phone]

## 2016-02-18 NOTE — Patient Instructions (Addendum)
We will be checking the following labs today - NONE   Medication Instructions:    Continue with your current medicines. BUT  I am adding Clonidine 0.1 mg to take at bedtime - this has been sent to your drug store.     Testing/Procedures To Be Arranged:  N/A  Follow-Up:   See me in about 2 to 3 weeks    Other Special Instructions:   Try to cut back your salt and eat out less.   Keep a check on your blood pressure    If you need a refill on your cardiac medications before your next appointment, please call your pharmacy.   Call the [place] office at [phone] if you have any questions, problems or concerns.

## 2016-02-25 ENCOUNTER — Other Ambulatory Visit: Payer: Self-pay

## 2016-02-25 DIAGNOSIS — T783XXA Angioneurotic edema, initial encounter: Secondary | ICD-10-CM

## 2016-02-25 MED ORDER — LEVOCETIRIZINE DIHYDROCHLORIDE 5 MG PO TABS: ORAL_TABLET | ORAL | 5 refills | Status: DC

## 2016-02-29 ENCOUNTER — Other Ambulatory Visit: Payer: Self-pay | Admitting: Cardiovascular Disease

## 2016-03-01 NOTE — Progress Notes (Signed)
CARDIOLOGY OFFICE NOTE  Date:  03/02/2016    [name] Date of Birth: 04/02/1951 Medical Record #[id number]  PCP:  Donnie Coffin, MD  Cardiologist:  Jerel Shepherd  Chief Complaint  Patient presents with  . Hypertension    Follow up visit - seen for Dr. Burt Knack    History of Present Illness: [name] is a 65 y.o. male who presents today for a follow up visit. Seen for Dr. Burt Knack. Former patient of Dr. Susa Simmonds.   He has an extensive history of PVD and CAD. He was previously followed by Dr. Bridgett Larsson at [facility] but now seeing Vascular in [facility] with Dr. Sammuel Hines. Prior cath in 2006 showing normal LV function with a totally occluded RCA with left to right collaterals. He has distal small vessel disease in the RCA with diffuse mild to moderate stenosis in the LAD. Former smoker. His PVD history is quite extensive and includes AAA (with no plans to intervene due to the nature of his aneurysm and his multiple comorbidities - last noted scan was 5.8 x 5.4 from 02/2015).   Other problems include HTN, HLD and OA. He has a history of hyponatremia as well.  He presented back in July of 2016 with chest pain/NSTEMI and underwent CABG x 5 by Dr. Cyndia Bent. He had vein harvesting from the right lower extremity. He then presented with progressive right lower extremity swelling and discomfort. He followed up with the cardiac surgery office on 8/2 and was started on Lasix and potassium for bilateral lower extremity edema. He fell at home and he was on the floor for at least 2 hours before his wife found him and she brought him back to the ER.   He was found to have acute RLE DVT and PE. Was on IV heparin, transitioned to Xarelto due to drop in platelets, HIT panel + and serotonin release assay + as well and he was changed to Arixtra and seen by Dr. Alen Blew. Warfarin started and he is to be on 6 months of therapy. He did require transfusion of 2 units PRBCs.   I have seen him several  times post op - little puny and slow to improve. Was still having considerable right leg pain - I put him on Ultram. Plavix stopped.   I saw him in early September of 2016 - he looked better but still with considerable pain in the right foot - most likely multifactorial - arterial/recent DVT/venous insufficiency. Was encouraged to see Dr. Sammuel Hines by [facility] but this is a pretty big ordeal to go to [facility] and [place] felt that since there "was nothing to do then not to go". He was a little depressed. Had stopped his alcohol intake.   At his follow up visit in October of 2016 - he was much better - really back to his baseline. Had been back to [facility] - aneurysm had grown - but still no plan to intervene. Was told to "go live his life". Anxious to get back to some traveling within the [place]. He had been released from hematology - was to stop coumadin after total 6 months of therapy. Coumadin was stopped on March 1st and he was transitioned back to Plavix afterwards.   Seen several times this past spring - weight was up. He had been on steroids for allergic reaction and eating "everything". BP sky high. I increased his medicines. Changed back to Coreg from Metoprolol. He was back drinking alcohol. ? How much salt  he uses. Last seen back in June and he was doing ok. His BP cuff matches up pretty well.   At last visit just a week ago - BP sky high. Going to [place] in early November.  Probably not planning on going back to [facility] - always told "just go do your bucket list".  When see last week - his BP was up and I added Clonidine to his regimen. Getting way too much salt and most likely drinking too much alcohol.   Comes back today. Here with his wife. Doing better. BP some lower. He is now having more issues with his back - trying to get another epidural - typically has to stop his Plavix for 5 days. Planning on going to [place] in a month. She hopes he can travel. No chest pain. Trying to do better with diet but  overall he is "just living".   Past Medical History:  Diagnosis Date  . AAA (abdominal aortic aneurysm) ([place])   . Anxiety   . CHF (congestive heart failure) ([place])   . COPD (chronic obstructive pulmonary disease) ([place])   . Coronary artery disease   . GERD (gastroesophageal reflux disease)   . History of blood transfusion   . Hyperlipidemia   . Hypertension    takes meds daily  . Hyponatremia   . Lumbar degenerative disc disease   . Myocardial infarction   . PAD (peripheral artery disease) ([place])   . PONV (postoperative nausea and vomiting)   . Raynaud's disease /phenomenon   . Stroke [facility]) 10/15/2005    Past Surgical History:  Procedure Laterality Date  . ABDOMINAL ANGIOGRAM  03/30/2012  . ABDOMINAL AORTAGRAM N/A 03/30/2012   Procedure: ABDOMINAL Maxcine Ham;  Surgeon: Conrad Quantico, MD;  Location: [facility] CATH LAB;  Service: Cardiovascular;  Laterality: N/A;  . CARDIAC CATHETERIZATION  2006  . CARDIAC CATHETERIZATION N/A 12/09/2014   Procedure: Left Heart Cath and Coronary Angiography;  Surgeon: [name], MD;  Location: [facility] CV LAB;  Service: Cardiovascular;  Laterality: N/A;  . CAROTID ENDARTERECTOMY     bilateral  . CAROTID ENDARTERECTOMY  04/13/2005   Right and 06/03/2005 Left  . CORONARY ARTERY BYPASS GRAFT N/A 12/13/2014   Procedure: CORONARY ARTERY BYPASS GRAFT times five using left internal mammary artery and endoscopic right leg saphenous vein harvest;  Surgeon: Gaye Pollack, MD;  Location: [facility] OR;  Service: Open Heart Surgery;  Laterality: N/A;  . HERNIA REPAIR    . ILIAC ARTERY STENT  10/15/2005    Left common and external   . INTRAOPERATIVE TRANSESOPHAGEAL ECHOCARDIOGRAM N/A 12/13/2014   Procedure: INTRAOPERATIVE TRANSESOPHAGEAL ECHOCARDIOGRAM;  Surgeon: Gaye Pollack, MD;  Location: [facility] OR;  Service: Open Heart Surgery;  Laterality: N/A;     Medications: Current Outpatient Prescriptions  Medication Sig Dispense Refill  . acetaminophen (TYLENOL) 325 MG tablet  Take 650 mg by mouth every 6 (six) hours as needed for mild pain or headache. Reported on 07/22/2015    . aspirin 81 MG tablet Take 81 mg by mouth every evening.     . carvedilol (COREG) 25 MG tablet Take 1 tablet (25 mg total) by mouth 2 (two) times daily. 180 tablet 3  . clobetasol ointment (TEMOVATE) 0.05 % apply to affected area twice a day if needed  0  . cloNIDine (CATAPRES) 0.1 MG tablet Take 1 tablet (0.1 mg total) by mouth at bedtime. 90 tablet 3  . clopidogrel (PLAVIX) 75 MG tablet Take 1 tablet (75 mg  total) by mouth daily. 90 tablet 3  . folic acid (FOLVITE) 1 MG tablet Take 1 tablet (1 mg total) by mouth daily. 30 tablet 0  . levocetirizine (XYZAL) 5 MG tablet TAKE ONE TABLET ONCE DAILY AS DIRECTED 30 tablet 5  . lisinopril (PRINIVIL,ZESTRIL) 20 MG tablet Take 1 tablet (20 mg total) by mouth 2 (two) times daily. 180 tablet 3  . NITROSTAT 0.4 MG SL tablet Place 0.4 mg under the tongue every 5 (five) minutes as needed. Chest pain  0  . ranitidine (ZANTAC) 300 MG tablet take 1 tablet by mouth once daily at bedtime 30 tablet 10  . rosuvastatin (CRESTOR) 20 MG tablet take 1 tablet by mouth at bedtime 90 tablet 2  . Thiamine HCl (VITAMIN B-1) 100 MG tablet Take 100 mg by mouth daily.      . traMADol (ULTRAM) 50 MG tablet Take 1 tablet (50 mg total) by mouth every 6 (six) hours as needed. 30 tablet 0  . triamcinolone ointment (KENALOG) 0.1 % Apply 1 application topically 2 (two) times daily. 80 g 1   No current facility-administered medications for this visit.     Allergies: Allergies  Allergen Reactions  . Hctz [Hydrochlorothiazide] Other (See Comments)    Black out due to electrolytes  . Norvasc [Amlodipine Besylate] Swelling       . Pletal [Cilostazol] Diarrhea  . Heparin Other (See Comments)    HIT positive as of 12/31/14    Social History: The patient  reports that he quit smoking about 10 years ago. His smoking use included Cigarettes. He has never used smokeless tobacco. He  reports that he drinks about 14.4 oz of alcohol per week . He reports that he does not use drugs.   Family History: The patient's family history includes Coronary artery disease (age of onset: 67) in his father; Dementia in his mother; Heart disease in his brother and father; Hyperlipidemia in his brother and father; Hypertension in his brother and father; Other (age of onset: 24) in his mother; Peripheral vascular disease in his father.   Review of Systems: Please see the history of present illness.   Otherwise, the review of systems is positive for none.   All other systems are reviewed and negative.   Physical Exam: VS:  BP 110/70   Pulse (!) 59   Ht 5\' 11"  (1.803 m)   Wt 217 lb (98.4 kg)   SpO2 93% Comment: at rest  BMI 30.27 kg/m  .  BMI Body mass index is 30.27 kg/m.  Wt Readings from Last 3 Encounters:  03/02/16 217 lb (98.4 kg)  02/18/16 216 lb (98 kg)  11/18/15 209 lb 1.9 oz (94.9 kg)    General: Pleasant. Obese male who is alert and in no acute distress.   HEENT: Normal.  Neck: Supple, no JVD, carotid bruits, or masses noted.  Cardiac: Regular rate and rhythm. No murmurs, rubs, or gallops. No edema.  Respiratory:  Lungs are clear to auscultation bilaterally with normal work of breathing.  GI: Soft and nontender.  MS: No deformity or atrophy. Gait and ROM intact.  Skin: Warm and dry. Color is normal.  Neuro:  Strength and sensation are intact and no gross focal deficits noted.  Psych: Alert, appropriate and with normal affect.   LABORATORY DATA:  EKG:  EKG is not ordered today.  Lab Results  Component Value Date   WBC 8.2 10/14/2015   HGB 12.7 (L) 10/14/2015   HCT 36.1 (L) 10/14/2015  PLT 228 10/14/2015   GLUCOSE 92 10/14/2015   CHOL 135 06/11/2015   TRIG 52 06/11/2015   HDL 69 06/11/2015   LDLCALC 56 06/11/2015   ALT 9 06/11/2015   AST 17 06/11/2015   NA 133 (L) 10/14/2015   K 4.7 10/14/2015   CL 98 10/14/2015   CREATININE 0.97 10/14/2015   BUN 17  10/14/2015   CO2 27 10/14/2015   TSH 0.60 08/05/2015   INR 1.8 07/14/2015   HGBA1C 5.4 12/12/2014    BNP (last 3 results) No results for input(s): BNP in the last 8760 hours.  ProBNP (last 3 results) No results for input(s): PROBNP in the last 8760 hours.   Other Studies Reviewed Today:  TEE Study Conclusions from 11/2014  - Left ventricle: Systolic function was mildly reduced. The  estimated ejection fraction was in the range of 45% to 50%.  Hypokinesis of the inferior myocardium. - Aortic valve: No evidence of vegetation. There was trivial  regurgitation. - Mitral valve: Systolic bowing without prolapse. There was mild  regurgitation. - Left atrium: No evidence of thrombus in the atrial cavity or  appendage. No evidence of thrombus in the appendage. - Atrial septum: No defect or patent foramen ovale was identified. - Tricuspid valve: No evidence of vegetation.   Assessment/Plan:   Essential hypertension, benign - BP recheck by me is 140/80. It has improved with the addition of Clonidine - for now, no further changes in his regimen.   Prior RLE DVT and PE - treated with 6 months of Coumadin - this was stopped March 1st, 2017. Did have + HIT panel. No longer seeing Dr. Alen Blew.   Thrombocytopenia - Heparin induced clinically and HIT Ab panel positive - Serotonin release assay positive as well and hence HIT confirmed - Heparin is listed as an allergy for him   CAD/recent NSTEMI/S/P CABG x 5 (12/13/14) - Continue beta blocker, crestor. Back on Plavix. No symptoms.    PAD  -with prior right foot pain - most likely multifactorial - much better - no longer taking pain medicines.    AAA  -Very complex and due to degree of calcification has been deemed not electively operable -Followed by Dr. Sammuel Hines at [facility] - but most likely not going back.   Back pain - hoping to get scheduled for repeat epidural - he has had before - typically stops his Plavix  for 5 days.   Current medicines are reviewed with the patient today.  The patient does not have concerns regarding medicines other than what has been noted above.  The following changes have been made:  See above.  Labs/ tests ordered today include:   No orders of the defined types were placed in this encounter.    Disposition:   FU with me in 3 months.   Patient is agreeable to this plan and will call if any problems develop in the interim.   Signed: Burtis Junes, RN, ANP-C 03/02/2016 9:08 AM  [facility] [address] [place], [place]  [address] Phone: [phone] Fax: [phone]

## 2016-03-02 ENCOUNTER — Encounter: Payer: Self-pay | Admitting: Nurse Practitioner

## 2016-03-02 ENCOUNTER — Ambulatory Visit (INDEPENDENT_AMBULATORY_CARE_PROVIDER_SITE_OTHER): Payer: Medicare Other | Admitting: Nurse Practitioner

## 2016-03-02 VITALS — BP 110/70 | HR 59 | Ht 71.0 in | Wt 217.0 lb

## 2016-03-02 DIAGNOSIS — I739 Peripheral vascular disease, unspecified: Secondary | ICD-10-CM | POA: Diagnosis not present

## 2016-03-02 DIAGNOSIS — I25810 Atherosclerosis of coronary artery bypass graft(s) without angina pectoris: Secondary | ICD-10-CM

## 2016-03-02 DIAGNOSIS — I10 Essential (primary) hypertension: Secondary | ICD-10-CM | POA: Diagnosis not present

## 2016-03-02 NOTE — Patient Instructions (Addendum)
We will be checking the following labs today - NONE   Medication Instructions:    Continue with your current medicines.     Testing/Procedures To Be Arranged:  N/A  Follow-Up:   See me in 3 months.     Other Special Instructions:   Keep a check on your blood pressure for me.     If you need a refill on your cardiac medications before your next appointment, please call your pharmacy.   Call the [place] office at [phone] if you have any questions, problems or concerns.

## 2016-03-11 DIAGNOSIS — M545 Low back pain: Secondary | ICD-10-CM | POA: Diagnosis not present

## 2016-03-11 DIAGNOSIS — M47817 Spondylosis without myelopathy or radiculopathy, lumbosacral region: Secondary | ICD-10-CM | POA: Diagnosis not present

## 2016-03-20 ENCOUNTER — Other Ambulatory Visit: Payer: Self-pay | Admitting: Cardiovascular Disease

## 2016-03-23 ENCOUNTER — Telehealth: Payer: Self-pay | Admitting: Nurse Practitioner

## 2016-03-23 ENCOUNTER — Telehealth: Payer: Self-pay | Admitting: *Deleted

## 2016-03-23 DIAGNOSIS — M47817 Spondylosis without myelopathy or radiculopathy, lumbosacral region: Secondary | ICD-10-CM | POA: Diagnosis not present

## 2016-03-23 DIAGNOSIS — M545 Low back pain: Secondary | ICD-10-CM | POA: Diagnosis not present

## 2016-03-23 NOTE — Telephone Encounter (Signed)
Pt's wife dropped off form today to be faxed to [facility]: Xray department @ [phone]. Pt is cleared to hold plavix 5 days prior to injection.

## 2016-03-23 NOTE — Telephone Encounter (Signed)
Received request for lumbar facet injection - plavix needs to be held. Held for 5 days in the past per Dr. Burt Knack. Will hold for 5 days. Paperwork faxed back to [employer].   Burtis Junes, RN, [place] [address] [place], [place]  [address] [phone]

## 2016-03-28 DIAGNOSIS — Z23 Encounter for immunization: Secondary | ICD-10-CM | POA: Diagnosis not present

## 2016-04-01 DIAGNOSIS — M545 Low back pain: Secondary | ICD-10-CM | POA: Diagnosis not present

## 2016-04-01 DIAGNOSIS — M47817 Spondylosis without myelopathy or radiculopathy, lumbosacral region: Secondary | ICD-10-CM | POA: Diagnosis not present

## 2016-04-22 DIAGNOSIS — M47817 Spondylosis without myelopathy or radiculopathy, lumbosacral region: Secondary | ICD-10-CM | POA: Diagnosis not present

## 2016-04-26 ENCOUNTER — Telehealth: Payer: Self-pay | Admitting: Nurse Practitioner

## 2016-04-26 DIAGNOSIS — M545 Low back pain: Secondary | ICD-10-CM | POA: Diagnosis not present

## 2016-04-26 DIAGNOSIS — M48061 Spinal stenosis, lumbar region without neurogenic claudication: Secondary | ICD-10-CM | POA: Diagnosis not present

## 2016-04-26 NOTE — Telephone Encounter (Signed)
Left message for patient to call back  

## 2016-04-26 NOTE — Telephone Encounter (Signed)
Procedure is a Radio Frequency Ablation, and they want to have this procedure as soon as possible. Will forward to Truitt Merle NP.

## 2016-04-26 NOTE — Telephone Encounter (Signed)
New message  Pt needs permission as to when he can get off of plavix 5 days prior to procedure  Please fax to Dr. Cala Bradford

## 2016-04-26 NOTE — Telephone Encounter (Signed)
I'm assuming this is for repeat lumbar injection.  Please confirm.  Ok to hold Plavix for 5 days.

## 2016-04-28 DIAGNOSIS — M48061 Spinal stenosis, lumbar region without neurogenic claudication: Secondary | ICD-10-CM | POA: Diagnosis not present

## 2016-04-28 DIAGNOSIS — M545 Low back pain: Secondary | ICD-10-CM | POA: Diagnosis not present

## 2016-05-04 DIAGNOSIS — M48061 Spinal stenosis, lumbar region without neurogenic claudication: Secondary | ICD-10-CM | POA: Diagnosis not present

## 2016-05-04 DIAGNOSIS — M545 Low back pain: Secondary | ICD-10-CM | POA: Diagnosis not present

## 2016-05-07 DIAGNOSIS — M47817 Spondylosis without myelopathy or radiculopathy, lumbosacral region: Secondary | ICD-10-CM | POA: Diagnosis not present

## 2016-05-07 DIAGNOSIS — M545 Low back pain: Secondary | ICD-10-CM | POA: Diagnosis not present

## 2016-05-18 DIAGNOSIS — M545 Low back pain: Secondary | ICD-10-CM | POA: Diagnosis not present

## 2016-05-18 DIAGNOSIS — M48061 Spinal stenosis, lumbar region without neurogenic claudication: Secondary | ICD-10-CM | POA: Diagnosis not present

## 2016-05-18 DIAGNOSIS — M47817 Spondylosis without myelopathy or radiculopathy, lumbosacral region: Secondary | ICD-10-CM | POA: Diagnosis not present

## 2016-05-22 DIAGNOSIS — M545 Low back pain: Secondary | ICD-10-CM | POA: Diagnosis not present

## 2016-05-31 ENCOUNTER — Ambulatory Visit (INDEPENDENT_AMBULATORY_CARE_PROVIDER_SITE_OTHER): Payer: Medicare Other | Admitting: Nurse Practitioner

## 2016-05-31 ENCOUNTER — Ambulatory Visit (HOSPITAL_COMMUNITY)
Admission: RE | Admit: 2016-05-31 | Discharge: 2016-05-31 | Disposition: A | Discharge status: Home / Self Care | Payer: Medicare Other | Source: Ambulatory Visit | Attending: Cardiovascular Disease | Admitting: Cardiovascular Disease

## 2016-05-31 ENCOUNTER — Telehealth: Payer: Self-pay | Admitting: *Deleted

## 2016-05-31 ENCOUNTER — Encounter: Payer: Self-pay | Admitting: Nurse Practitioner

## 2016-05-31 VITALS — BP 130/70 | HR 63 | Ht 72.0 in | Wt 230.4 lb

## 2016-05-31 DIAGNOSIS — R609 Edema, unspecified: Secondary | ICD-10-CM

## 2016-05-31 DIAGNOSIS — Z87891 Personal history of nicotine dependence: Secondary | ICD-10-CM | POA: Insufficient documentation

## 2016-05-31 DIAGNOSIS — I10 Essential (primary) hypertension: Secondary | ICD-10-CM | POA: Diagnosis not present

## 2016-05-31 DIAGNOSIS — E785 Hyperlipidemia, unspecified: Secondary | ICD-10-CM | POA: Diagnosis not present

## 2016-05-31 DIAGNOSIS — Z951 Presence of aortocoronary bypass graft: Secondary | ICD-10-CM | POA: Diagnosis not present

## 2016-05-31 DIAGNOSIS — I714 Abdominal aortic aneurysm, without rupture, unspecified: Secondary | ICD-10-CM

## 2016-05-31 DIAGNOSIS — I5022 Chronic systolic (congestive) heart failure: Secondary | ICD-10-CM | POA: Diagnosis not present

## 2016-05-31 DIAGNOSIS — I739 Peripheral vascular disease, unspecified: Secondary | ICD-10-CM | POA: Diagnosis not present

## 2016-05-31 DIAGNOSIS — J449 Chronic obstructive pulmonary disease, unspecified: Secondary | ICD-10-CM | POA: Insufficient documentation

## 2016-05-31 LAB — BASIC METABOLIC PANEL
BUN/Creatinine Ratio: 17 (ref 10–24)
BUN: 21 mg/dL (ref 8–27)
CO2: 20 mmol/L (ref 18–29)
Calcium: 8.4 mg/dL — ABNORMAL LOW (ref 8.6–10.2)
Chloride: 94 mmol/L — ABNORMAL LOW (ref 96–106)
Creatinine, Ser: 1.25 mg/dL (ref 0.76–1.27)
GFR calc Af Amer: 68 mL/min/{1.73_m2} (ref >59)
GFR calc non Af Amer: 59 mL/min/{1.73_m2} — ABNORMAL LOW (ref >59)
Glucose: 96 mg/dL (ref 65–99)
Potassium: 5 mmol/L (ref 3.5–5.2)
Sodium: 129 mmol/L — ABNORMAL LOW (ref 134–144)

## 2016-05-31 LAB — CBC
Hematocrit: 31.5 % — ABNORMAL LOW (ref 37.5–51.0)
Hemoglobin: 11.2 g/dL — ABNORMAL LOW (ref 13.0–17.7)
MCH: 35.7 pg — ABNORMAL HIGH (ref 26.6–33.0)
MCHC: 35.6 g/dL (ref 31.5–35.7)
MCV: 100 fL — ABNORMAL HIGH (ref 79–97)
Platelets: 187 10*3/uL (ref 150–379)
RBC: 3.14 x10E6/uL — ABNORMAL LOW (ref 4.14–5.80)
RDW: 13.1 % (ref 12.3–15.4)
WBC: 8.4 10*3/uL (ref 3.4–10.8)

## 2016-05-31 LAB — BRAIN NATRIURETIC PEPTIDE: BNP: 605.7 pg/mL — ABNORMAL HIGH (ref 0.0–100.0)

## 2016-05-31 MED ORDER — FUROSEMIDE 40 MG PO TABS: 60.0000 mg | ORAL_TABLET | Freq: Every day | ORAL | 3 refills | Status: DC

## 2016-05-31 NOTE — Patient Instructions (Addendum)
We will be checking the following labs today - STAT BMET, STAT CBC, STAT pro BNP  Medication Instructions:    Continue with your current medicines.   I am placing you on Lasix 40 mg - to take 1 1/2 pills daily for 3 days and then 1 daily     Testing/Procedures To Be Arranged:  Venous doppler study - today  Follow-Up:   Needs OV with Dr. Vinnie Level at [facility] arranged - he has seen him in the past.   See me in about a week with repeat labs.     Other Special Instructions:   N/A    If you need a refill on your cardiac medications before your next appointment, please call your pharmacy.   Call the [place] office at [phone] if you have any questions, problems or concerns.

## 2016-05-31 NOTE — Addendum Note (Signed)
Addended by: Burtis Junes on: 05/31/2016 12:13 PM   Modules accepted: Orders

## 2016-05-31 NOTE — Telephone Encounter (Signed)
Faxing to Dr. Vinnie Level @ [facility] @ [phone] [address] [facility] results of aneurysm and Lori's ov note.

## 2016-05-31 NOTE — Addendum Note (Signed)
Addended by: Eulis Foster on: 05/31/2016 09:48 AM   Modules accepted: Orders

## 2016-05-31 NOTE — Addendum Note (Signed)
Addended by: Eulis Foster on: 05/31/2016 09:49 AM   Modules accepted: Orders

## 2016-05-31 NOTE — Addendum Note (Signed)
Addended by: Eulis Foster on: 05/31/2016 09:50 AM   Modules accepted: Orders

## 2016-05-31 NOTE — Addendum Note (Signed)
Addended by: Burtis Junes on: 05/31/2016 10:04 AM   Modules accepted: Orders

## 2016-05-31 NOTE — Progress Notes (Addendum)
CARDIOLOGY OFFICE NOTE  Date:  05/31/2016    [name] Date of Birth: 11/25/1950 Medical Record #[id number]  PCP:  Donnie Coffin, MD  Cardiologist:  Jerel Shepherd    Chief Complaint  Patient presents with  . Coronary Artery Disease  . Hypertension    Follow up visit - seen for Dr. Burt Knack    History of Present Illness: [name] is a 66 y.o. male who presents today for a follow up visit. This is a 3 month check. Seen for Dr. Burt Knack. Former patient of Dr. Susa Simmonds.   He has an extensive history of PVD and CAD. He was previously followed by Dr. Bridgett Larsson at [facility] but now seeing Vascular in [facility] with Dr. Sammuel Hines. Prior cath in 2006 showing normal LV function with a totally occluded RCA with left to right collaterals. He has distal small vessel disease in the RCA with diffuse mild to moderate stenosis in the LAD. Former smoker. His PVD history is quite extensive and includes AAA (with no plans to intervene due to the nature of his aneurysm and his multiple comorbidities - last noted scan was 5.8 x 5.4 from 02/2015).   Other problems include HTN, HLD and OA. He has a history of hyponatremia as well.  He presented back in July of 2016 with chest pain/NSTEMI and underwent CABG x 5 by Dr. Cyndia Bent. He had vein harvesting from the right lower extremity. He then presented with progressive right lower extremity swelling and discomfort. He followed up with the cardiac surgery office on 8/2 and was started on Lasix and potassium for bilateral lower extremity edema. He fell at home and he was on the floor for at least 2 hours before his wife found him and she brought him back to the ER.   He was found to have acute RLE DVT and PE. Was on IV heparin, transitioned to Xarelto due to drop in platelets, HIT panel + and serotonin release assay + as well and he was changed to Arixtra and seen by Dr. Alen Blew. Warfarin started and he is to be on 6 months of therapy. He did require  transfusion of 2 units PRBCs.   I have seen him several times post op - little puny and slow to improve. Was still having considerable right leg pain - I put him on Ultram. Plavix stopped.   I saw him in early September of 2016 - he looked better but still with considerable pain in the right foot - most likely multifactorial - arterial/recent DVT/venous insufficiency. Was encouraged to see Dr. Sammuel Hines by [facility] but this is a pretty big ordeal to go to [facility] and [name] felt that since there "was nothing to do then not to go". He was a little depressed. Had stopped his alcohol intake.   At his follow up visit in October of 2016 - he was much better - really back to his baseline. Had been back to [facility] - aneurysm had grown - but still no plan to intervene. Due to occluded common and external iliac artery - he would need an open aneurysm repair.  Was told to "go live his life". Anxious to get back to some traveling within the [place]. He had been released from hematology - was to stop coumadin after total 6 months of therapy. Coumadin was stopped on March 1st and he was transitioned back to Plavix afterwards.   Seen several times last spring - weight was up. He had been on  steroids for allergic reaction and eating "everything". BP sky high. I increased his medicines. Changed back to Coreg from Metoprolol. He was back drinking alcohol. ? How much salt he uses. Last seen back in October and he was doing ok other than back pain. His BP cuff matches up pretty well.  Probably not planning on going back to [facility] - always told "just go do your bucket list".  He has had several back injections since his last visit here. He has had imaging done by orthopedics - the aneurysm is noted - he was told to come back here. [name] and his wife had reiterated several times in the past that they do NOT wish to know the actual measurements.   I have received a copy of the lumbar spine film - noted a 6.1 x 6.5 cm infrarenal aortic aneurysm.  Vascular surgery referral was recommended.   He is followed by Dr. Sammuel Hines at [facility] - last measurement there was 5.8 x 5.4 - this was from October of 2016.   Comes back today. Here with his wife. Lots of issues. Still having lots of right buttock pain. Has had no relief with any of the injections. Now on chronic narcotics. "taking pain pills and beer" for relief. Just finished steroid therapy - got some relief. Legs are very swollen, tight and red. No chest pain. Not short of breath. Very inactive. Weight has climbed. She tells me that she was told anything about the AAA noted on recent scan. She is asking for another referral to someone who can help with this pain, asking about me given him chronic narcotics, etc.   Past Medical History:  Diagnosis Date  . AAA (abdominal aortic aneurysm) ([place])   . Anxiety   . CHF (congestive heart failure) ([place])   . COPD (chronic obstructive pulmonary disease) ([place])   . Coronary artery disease   . GERD (gastroesophageal reflux disease)   . History of blood transfusion   . Hyperlipidemia   . Hypertension    takes meds daily  . Hyponatremia   . Lumbar degenerative disc disease   . Myocardial infarction   . PAD (peripheral artery disease) ([place])   . PONV (postoperative nausea and vomiting)   . Raynaud's disease /phenomenon   . Stroke [facility]) 10/15/2005    Past Surgical History:  Procedure Laterality Date  . ABDOMINAL ANGIOGRAM  03/30/2012  . ABDOMINAL AORTAGRAM N/A 03/30/2012   Procedure: ABDOMINAL Maxcine Ham;  Surgeon: Conrad [place], MD;  Location: [facility] CATH LAB;  Service: Cardiovascular;  Laterality: N/A;  . CARDIAC CATHETERIZATION  2006  . CARDIAC CATHETERIZATION N/A 12/09/2014   Procedure: Left Heart Cath and Coronary Angiography;  Surgeon: [name], MD;  Location: [place] CV LAB;  Service: Cardiovascular;  Laterality: N/A;  . CAROTID ENDARTERECTOMY     bilateral  . CAROTID ENDARTERECTOMY  04/13/2005   Right and 06/03/2005 Left  . CORONARY ARTERY  BYPASS GRAFT N/A 12/13/2014   Procedure: CORONARY ARTERY BYPASS GRAFT times five using left internal mammary artery and endoscopic right leg saphenous vein harvest;  Surgeon: Gaye Pollack, MD;  Location: [place] OR;  Service: Open Heart Surgery;  Laterality: N/A;  . HERNIA REPAIR    . ILIAC ARTERY STENT  10/15/2005    Left common and external   . INTRAOPERATIVE TRANSESOPHAGEAL ECHOCARDIOGRAM N/A 12/13/2014   Procedure: INTRAOPERATIVE TRANSESOPHAGEAL ECHOCARDIOGRAM;  Surgeon: Gaye Pollack, MD;  Location: [facility] OR;  Service: Open Heart Surgery;  Laterality: N/A;     Medications:  Current Outpatient Prescriptions  Medication Sig Dispense Refill  . acetaminophen (TYLENOL) 325 MG tablet Take 650 mg by mouth every 6 (six) hours as needed for mild pain or headache. Reported on 07/22/2015    . aspirin 81 MG tablet Take 81 mg by mouth every evening.     . carvedilol (COREG) 25 MG tablet Take 1 tablet (25 mg total) by mouth 2 (two) times daily. 180 tablet 3  . clobetasol ointment (TEMOVATE) 0.05 % apply to affected area twice a day if needed  0  . cloNIDine (CATAPRES) 0.1 MG tablet Take 1 tablet (0.1 mg total) by mouth at bedtime. 90 tablet 3  . clopidogrel (PLAVIX) 75 MG tablet Take 1 tablet (75 mg total) by mouth daily. 90 tablet 3  . folic acid (FOLVITE) 1 MG tablet Take 1 tablet (1 mg total) by mouth daily. 30 tablet 0  . HYDROcodone-acetaminophen (NORCO/VICODIN) 5-325 MG tablet take 1 tablet by mouth twice a day if needed for pain  0  . levocetirizine (XYZAL) 5 MG tablet TAKE ONE TABLET ONCE DAILY AS DIRECTED 30 tablet 5  . lisinopril (PRINIVIL,ZESTRIL) 20 MG tablet Take 1 tablet (20 mg total) by mouth 2 (two) times daily. 180 tablet 3  . NITROSTAT 0.4 MG SL tablet Place 0.4 mg under the tongue every 5 (five) minutes as needed. Chest pain  0  . ranitidine (ZANTAC) 300 MG tablet take 1 tablet by mouth at bedtime 30 tablet 11  . rosuvastatin (CRESTOR) 20 MG tablet take 1 tablet by mouth at bedtime 90 tablet  2  . Thiamine HCl (VITAMIN B-1) 100 MG tablet Take 100 mg by mouth daily.      . traMADol (ULTRAM) 50 MG tablet Take 1 tablet (50 mg total) by mouth every 6 (six) hours as needed. 30 tablet 0  . triamcinolone ointment (KENALOG) 0.1 % Apply 1 application topically 2 (two) times daily. 80 g 1   No current facility-administered medications for this visit.     Allergies: Allergies  Allergen Reactions  . Hctz [Hydrochlorothiazide] Other (See Comments)    Black out due to electrolytes  . Norvasc [Amlodipine Besylate] Swelling       . Pletal [Cilostazol] Diarrhea  . Heparin Other (See Comments)    HIT positive as of 12/31/14    Social History: The patient  reports that he quit smoking about 11 years ago. His smoking use included Cigarettes. He has never used smokeless tobacco. He reports that he drinks about 14.4 oz of alcohol per week . He reports that he does not use drugs.   Family History: The patient's family history includes Coronary artery disease (age of onset: 75) in his father; Dementia in his mother; Heart disease in his brother and father; Hyperlipidemia in his brother and father; Hypertension in his brother and father; Other (age of onset: 15) in his mother; Peripheral vascular disease in his father.   Review of Systems: Please see the history of present illness.   Otherwise, the review of systems is positive for back pain.   All other systems are reviewed and negative.   Physical Exam: VS:  BP 130/70   Pulse 63   Ht 6' (1.829 m)   Wt 230 lb 6.4 oz (104.5 kg)   SpO2 97% Comment: at rest  BMI 31.25 kg/m  .  BMI Body mass index is 31.25 kg/m.  Wt Readings from Last 3 Encounters:  05/31/16 230 lb 6.4 oz (104.5 kg)  03/02/16 217 lb (98.4 kg)  02/18/16 216 lb (98 kg)    General: Pleasant. Alert. Lots of difficulty walking. He is alert and in no acute distress.   HEENT: Normal.  Neck: Supple, no JVD, carotid bruits, or masses noted.  Cardiac: Regular rate and rhythm. No  murmurs, rubs, or gallops. Over 2+ edema bilaterally.  Respiratory:  Lungs are clear to auscultation bilaterally with normal work of breathing.  GI: Soft and nontender.  MS: No deformity or atrophy. Gait and ROM intact.  Skin: Warm and dry. Color is normal.  Neuro:  Strength and sensation are intact and no gross focal deficits noted.  Psych: Alert, appropriate and with normal affect.   LABORATORY DATA:  EKG:  EKG is not ordered today.  Lab Results  Component Value Date   WBC 8.2 10/14/2015   HGB 12.7 (L) 10/14/2015   HCT 36.1 (L) 10/14/2015   PLT 228 10/14/2015   GLUCOSE 92 10/14/2015   CHOL 135 06/11/2015   TRIG 52 06/11/2015   HDL 69 06/11/2015   LDLCALC 56 06/11/2015   ALT 9 06/11/2015   AST 17 06/11/2015   NA 133 (L) 10/14/2015   K 4.7 10/14/2015   CL 98 10/14/2015   CREATININE 0.97 10/14/2015   BUN 17 10/14/2015   CO2 27 10/14/2015   TSH 0.60 08/05/2015   INR 1.8 07/14/2015   HGBA1C 5.4 12/12/2014    BNP (last 3 results) No results for input(s): BNP in the last 8760 hours.  ProBNP (last 3 results) No results for input(s): PROBNP in the last 8760 hours.   Other Studies Reviewed Today:  TEE Study Conclusions from 11/2014  - Left ventricle: Systolic function was mildly reduced. The  estimated ejection fraction was in the range of 45% to 50%.  Hypokinesis of the inferior myocardium. - Aortic valve: No evidence of vegetation. There was trivial  regurgitation. - Mitral valve: Systolic bowing without prolapse. There was mild  regurgitation. - Left atrium: No evidence of thrombus in the atrial cavity or  appendage. No evidence of thrombus in the appendage. - Atrial septum: No defect or patent foramen ovale was identified. - Tricuspid valve: No evidence of vegetation.   Assessment/Plan:   Essential hypertension, benign - Stable on current regimen.   Prior RLE DVT and PE - treated with 6 months of Coumadin - this was stopped March 1st, 2017. Did  have + HIT panel. No longer seeing Dr. Alen Blew. Now with more swelling bilaterally and some redness - getting doppler study today. Further disposition pending. If negative will need to be diuresed. Labs today.   Thrombocytopenia - Heparin induced clinically and HIT Ab panel positive - Serotonin release assay positive as well and hence HIT confirmed - Heparin is listed as an allergy for him   CAD/recent NSTEMI/S/P CABG x 5 (12/13/14) - Continue beta blocker, crestor. Back on Plavix. No symptoms.    PAD  -with chronic pain - not clear to me if this current issue is more vascular or nerve related.     AAA  -Very complex and due to degree of calcification has been deemed not electively operable - this is now noted to be enlarging - they are agreeable to seeing Dr. Sammuel Hines again.   Back pain/right buttock pain - no real relief with his injections - on chronic narcotics which I CANNOT and WILL NOT prescribe - may need pain management referral.   Current medicines are reviewed with the patient today.  The patient does not have concerns regarding medicines other than  what has been noted above.  The following changes have been made:  See above.  Labs/ tests ordered today include:    Orders Placed This Encounter  Procedures  . Basic metabolic panel  . CBC  . Pro b natriuretic peptide (BNP)  . Ambulatory referral to Vascular Surgery     Disposition:   Further disposition pending based on doppler study results.   Patient is agreeable to this plan and will call if any problems develop in the interim.   Signed: Burtis Junes, RN, ANP-C 05/31/2016 9:44 AM  [place] [address] [place], [place]  [address] Phone: [phone] Fax: [phone]       Addendum: Arranging OV with Dr. Sammuel Hines.  Doppler study negative for DVT Placing on Lasix 60 mg daily for 3 days, then 40 mg a day - will need to see back in about a week with labs.  RX sent to the [facility] on [address].   Burtis Junes, RN, [place] [address] [place], [place]  [id number] [phone]

## 2016-06-07 ENCOUNTER — Ambulatory Visit (INDEPENDENT_AMBULATORY_CARE_PROVIDER_SITE_OTHER): Payer: Medicare Other | Admitting: Nurse Practitioner

## 2016-06-07 ENCOUNTER — Encounter: Payer: Self-pay | Admitting: Nurse Practitioner

## 2016-06-07 VITALS — BP 140/70 | HR 68 | Ht 71.0 in | Wt 221.8 lb

## 2016-06-07 DIAGNOSIS — L03119 Cellulitis of unspecified part of limb: Secondary | ICD-10-CM | POA: Diagnosis not present

## 2016-06-07 MED ORDER — CEPHALEXIN 250 MG PO CAPS: 250.0000 mg | ORAL_CAPSULE | Freq: Four times a day (QID) | ORAL | 0 refills | Status: DC

## 2016-06-07 NOTE — Progress Notes (Signed)
CARDIOLOGY OFFICE NOTE  Date:  06/07/2016    [name] Date of Birth: [birth year] Medical Record #[id number]  PCP:  Donnie Coffin, MD  Cardiologist:  Jerel Shepherd   Chief Complaint  Patient presents with  . Hypertension  . Coronary Artery Disease    History of Present Illness: [name] is a 66 y.o. male who presents today for a follow up visit.. Seen for Dr. Burt Knack. Former patient of Dr. Susa Simmonds.   He has an extensive history of PVD and CAD. He was previously followed by Dr. Bridgett Larsson at [facility] but now seeing Vascular in [facility] with Dr. Sammuel Hines. Prior cath in 2006 showing normal LV function with a totally occluded RCA with left to right collaterals. He has distal small vessel disease in the RCA with diffuse mild to moderate stenosis in the LAD. Former smoker. His PVD history is quite extensive and includes AAA (with no plans to intervene due to the nature of his aneurysm and his multiple comorbidities - last noted scan was 5.8 x 5.4 from 02/2015).   Other problems include HTN, HLD and OA. He has a history of hyponatremia as well.  He presented back in July of 2016 with chest pain/NSTEMI and underwent CABG x 5 by Dr. Cyndia Bent. He had vein harvesting from the right lower extremity. He then presented with progressive right lower extremity swelling and discomfort. He followed up with the cardiac surgery office on 8/2 and was started on Lasix and potassium for bilateral lower extremity edema. He fell at home and he was on the floor for at least 2 hours before his wife found him and she brought him back to the ER.   He was found to have acute RLE DVT and PE. Was on IV heparin, transitioned to Xarelto due to drop in platelets, HIT panel + and serotonin release assay + as well and he was changed to Arixtra and seen by Dr. Alen Blew. Warfarin started and he is to be on 6 months of therapy. He did require transfusion of 2 units PRBCs.   I have seen him several times post op -  little puny and slow to improve. Was still having considerable right leg pain - I put him on Ultram. Plavix stopped.   I saw him in early September of 2016 - he looked better but still with considerable pain in the right foot - most likely multifactorial - arterial/recent DVT/venous insufficiency. Was encouraged to see Dr. Sammuel Hines by [facility] but this is a pretty big ordeal to go to [facility] and [name] felt that since there "was nothing to do then not to go". He was a little depressed. Had stopped his alcohol intake.   At his follow up visit in October of 2016 - he was much better - really back to his baseline. Had been back to [facility] - aneurysm had grown - but still no plan to intervene. Due to occluded common and external iliac artery - he would need an open aneurysm repair.  Was told to "go live his life". Anxious to get back to some traveling within the [place]. He had been released from hematology - was to stop coumadin after total 6 months of therapy. Coumadin was stopped on March 1st and he was transitioned back to Plavix afterwards.   Seen several times last spring - weight was up. He had been on steroids for allergic reaction and eating "everything". BP sky high. I increased his medicines. Changed back to Coreg  from Metoprolol. He was back drinking alcohol. ? How much salt he uses. Last seen back in October and he was doing ok other than back pain. His BP cuff matches up pretty well. Probably not planning on going back to [facility] - always told "just go do your bucket list".  He has had several back injections since his last visit here. He has had imaging done by orthopedics - the aneurysm is noted - he was told to come back here. [name] and his wife had reiterated several times in the past that they do NOT wish to know the actual measurements.   I have received a copy of the lumbar spine film - noted a 6.1 x 6.5 cm infrarenal aortic aneurysm. Vascular surgery referral was recommended.   He is followed by Dr.  Sammuel Hines at [facility] - last measurement there was 5.8 x 5.4 - this was from October of 2016.   I saw him last week - lots of pain issues. Had been to ortho - had imaging with noted increase in his AAA. Referred back to Dr. Sammuel Hines. Lots of swelling. Added back lasix. No DVT noted on duplex study.   Comes back today. Here with his wife. Has had some issues with confusion. Now with a stye in the right eye. Legs are red. Swelling has improved some. Weight is down. Not short of breath. Has not heard from [facility] yet - they are closed today - AAA has enlarged. No chest pain. No follow up planned with primary care. To see ortho later this week - now on Ultram - will discuss possible pain management referral. She notes he seems more confused at times. She now has him on Ultram twice a day and this seems to be controlling his pain.   Past Medical History:  Diagnosis Date  . AAA (abdominal aortic aneurysm) ([place])   . Anxiety   . CHF (congestive heart failure) ([place])   . COPD (chronic obstructive pulmonary disease) ([place])   . Coronary artery disease   . GERD (gastroesophageal reflux disease)   . History of blood transfusion   . Hyperlipidemia   . Hypertension    takes meds daily  . Hyponatremia   . Lumbar degenerative disc disease   . Myocardial infarction   . PAD (peripheral artery disease) ([place])   . PONV (postoperative nausea and vomiting)   . Raynaud's disease /phenomenon   . Stroke [facility]) 10/15/2005    Past Surgical History:  Procedure Laterality Date  . ABDOMINAL ANGIOGRAM  03/30/2012  . ABDOMINAL AORTAGRAM N/A 03/30/2012   Procedure: ABDOMINAL Maxcine Ham;  Surgeon: Conrad Bay Pines, MD;  Location: [facility] CATH LAB;  Service: Cardiovascular;  Laterality: N/A;  . CARDIAC CATHETERIZATION  2006  . CARDIAC CATHETERIZATION N/A 12/09/2014   Procedure: Left Heart Cath and Coronary Angiography;  Surgeon: [name], MD;  Location: [place] CV LAB;  Service: Cardiovascular;  Laterality: N/A;  . CAROTID ENDARTERECTOMY      bilateral  . CAROTID ENDARTERECTOMY  04/13/2005   Right and 06/03/2005 Left  . CORONARY ARTERY BYPASS GRAFT N/A 12/13/2014   Procedure: CORONARY ARTERY BYPASS GRAFT times five using left internal mammary artery and endoscopic right leg saphenous vein harvest;  Surgeon: Gaye Pollack, MD;  Location: [place] OR;  Service: Open Heart Surgery;  Laterality: N/A;  . HERNIA REPAIR    . ILIAC ARTERY STENT  10/15/2005    Left common and external   . INTRAOPERATIVE TRANSESOPHAGEAL ECHOCARDIOGRAM N/A 12/13/2014   Procedure: INTRAOPERATIVE TRANSESOPHAGEAL  ECHOCARDIOGRAM;  Surgeon: Gaye Pollack, MD;  Location: [place];  Service: Open Heart Surgery;  Laterality: N/A;     Medications: Current Outpatient Prescriptions  Medication Sig Dispense Refill  . acetaminophen (TYLENOL) 325 MG tablet Take 650 mg by mouth every 6 (six) hours as needed for mild pain or headache. Reported on 07/22/2015    . aspirin 81 MG tablet Take 81 mg by mouth every evening.     . carvedilol (COREG) 25 MG tablet Take 1 tablet (25 mg total) by mouth 2 (two) times daily. 180 tablet 3  . clobetasol ointment (TEMOVATE) 0.05 % apply to affected area twice a day if needed  0  . cloNIDine (CATAPRES) 0.1 MG tablet Take 1 tablet (0.1 mg total) by mouth at bedtime. 90 tablet 3  . clopidogrel (PLAVIX) 75 MG tablet Take 1 tablet (75 mg total) by mouth daily. 90 tablet 3  . folic acid (FOLVITE) 1 MG tablet Take 1 tablet (1 mg total) by mouth daily. 30 tablet 0  . furosemide (LASIX) 40 MG tablet Take 1.5 tablets (60 mg total) by mouth daily. Take 60 mg daily for 3 days, then 40 mg daily. (Patient taking differently: Take 40 mg by mouth daily. Take 60 mg daily for 3 days, then 40 mg daily.) 90 tablet 3  . HYDROcodone-acetaminophen (NORCO/VICODIN) 5-325 MG tablet take 1 tablet by mouth twice a day if needed for pain  0  . levocetirizine (XYZAL) 5 MG tablet TAKE ONE TABLET ONCE DAILY AS DIRECTED 30 tablet 5  . lisinopril (PRINIVIL,ZESTRIL) 20 MG tablet  Take 1 tablet (20 mg total) by mouth 2 (two) times daily. 180 tablet 3  . NITROSTAT 0.4 MG SL tablet Place 0.4 mg under the tongue every 5 (five) minutes as needed. Chest pain  0  . ranitidine (ZANTAC) 300 MG tablet take 1 tablet by mouth at bedtime 30 tablet 11  . rosuvastatin (CRESTOR) 20 MG tablet take 1 tablet by mouth at bedtime 90 tablet 2  . Thiamine HCl (VITAMIN B-1) 100 MG tablet Take 100 mg by mouth daily.      . traMADol (ULTRAM) 50 MG tablet Take 1 tablet (50 mg total) by mouth every 6 (six) hours as needed. 30 tablet 0  . triamcinolone ointment (KENALOG) 0.1 % Apply 1 application topically 2 (two) times daily. 80 g 1  . cephALEXin (KEFLEX) 250 MG capsule Take 1 capsule (250 mg total) by mouth 4 (four) times daily. 28 capsule 0   No current facility-administered medications for this visit.     Allergies: Allergies  Allergen Reactions  . Hctz [Hydrochlorothiazide] Other (See Comments)    Black out due to electrolytes  . Norvasc [Amlodipine Besylate] Swelling       . Pletal [Cilostazol] Diarrhea  . Heparin Other (See Comments)    HIT positive as of 12/31/14    Social History: The patient  reports that he quit smoking about 11 years ago. His smoking use included Cigarettes. He has never used smokeless tobacco. He reports that he drinks about 14.4 oz of alcohol per week . He reports that he does not use drugs.   Family History: The patient's family history includes Coronary artery disease (age of onset: 25) in his father; Dementia in his mother; Heart disease in his brother and father; Hyperlipidemia in his brother and father; Hypertension in his brother and father; Other (age of onset: 51) in his mother; Peripheral vascular disease in his father.   Review of Systems: Please  see the history of present illness.   Otherwise, the review of systems is positive for none.   All other systems are reviewed and negative.   Physical Exam: VS:  BP 140/70   Pulse 68   Ht 5\' 11"  (1.803  m)   Wt 221 lb 12.8 oz (100.6 kg)   SpO2 96% Comment: at rest  BMI 30.93 kg/m  .  BMI Body mass index is 30.93 kg/m.  Wt Readings from Last 3 Encounters:  06/07/16 221 lb 12.8 oz (100.6 kg)  05/31/16 230 lb 6.4 oz (104.5 kg)  03/02/16 217 lb (98.4 kg)    General: Pleasant. Chronically ill but alert and in no acute distress. His weight is down 9 pounds.   HEENT: Normal.  Neck: Supple, no JVD, carotid bruits, or masses noted.  Cardiac: Regular rate and rhythm. No murmurs, rubs, or gallops. Less edema but both legs red and warm to touch.  Respiratory:  Lungs are clear to auscultation bilaterally with normal work of breathing.  GI: Soft and nontender.  MS: No deformity or atrophy. Gait and ROM intact.  Skin: Warm and dry. Color is normal.  Neuro:  Strength and sensation are intact and no gross focal deficits noted.  Psych: Alert, appropriate and with normal affect.   LABORATORY DATA:  EKG:  EKG is not ordered today.  Lab Results  Component Value Date   WBC 8.4 05/31/2016   HGB 12.7 (L) 10/14/2015   HCT 31.5 (L) 05/31/2016   PLT 187 05/31/2016   GLUCOSE 96 05/31/2016   CHOL 135 06/11/2015   TRIG 52 06/11/2015   HDL 69 06/11/2015   LDLCALC 56 06/11/2015   ALT 9 06/11/2015   AST 17 06/11/2015   NA 129 (L) 05/31/2016   K 5.0 05/31/2016   CL 94 (L) 05/31/2016   CREATININE 1.25 05/31/2016   BUN 21 05/31/2016   CO2 20 05/31/2016   TSH 0.60 08/05/2015   INR 1.8 07/14/2015   HGBA1C 5.4 12/12/2014    BNP (last 3 results)  Recent Labs  05/31/16 0950  BNP 605.7*    ProBNP (last 3 results) No results for input(s): PROBNP in the last 8760 hours.   Other Studies Reviewed Today:  TEE Study Conclusions from 11/2014  - Left ventricle: Systolic function was mildly reduced. The  estimated ejection fraction was in the range of 45% to 50%.  Hypokinesis of the inferior myocardium. - Aortic valve: No evidence of vegetation. There was trivial  regurgitation. - Mitral  valve: Systolic bowing without prolapse. There was mild  regurgitation. - Left atrium: No evidence of thrombus in the atrial cavity or  appendage. No evidence of thrombus in the appendage. - Atrial septum: No defect or patent foramen ovale was identified. - Tricuspid valve: No evidence of vegetation.   Assessment/Plan:  Essential hypertension, benign - Stable on current regimen.   Prior RLE DVT and PE - treated with 6 months of Coumadin - this was stopped March 1st, 2017. Did have + HIT panel. No longer seeing Dr. Alen Blew. Has had more swelling lately but with negative doppler study last week. Legs look better from the swelling standpoint - but red and warm to touch today - probable low grade cellulitis - will give round of antibiotics. Will keep on the low dose diuretic.   Thrombocytopenia - Heparin induced clinically and HIT Ab panel positive - Serotonin release assay positive as well and hence HIT confirmed - Heparin is listed as an allergy for him  CAD/recent NSTEMI/S/P CABG x 5 (12/13/14) - Continue beta blocker, crestor. Back on Plavix. No symptoms.    PAD  -with chronic pain - not clear to me if this current issue is more vascular or nerve related.     AAA  -Very complex and due to degree of calcification has been deemed not electively operable - this is now noted to be enlarging - they are agreeable to seeing Dr. Sammuel Hines again. Trying to get appointment.   Back pain/right buttock pain- no real relief with his injections - on chronic narcotics which I CANNOT and WILL NOT prescribe - may need pain management referral.   Current medicines are reviewed with the patient today.  The patient does not have concerns regarding medicines other than what has been noted above.  The following changes have been made:  See above.  Labs/ tests ordered today include:    Orders Placed This Encounter  Procedures  . Basic metabolic panel     Disposition:   FU with me  in 3 weeks.   Patient is agreeable to this plan and will call if any problems develop in the interim.   Signed: Burtis Junes, RN, ANP-C 06/07/2016 10:26 AM  [facility] [address] [place],   [address] Phone: [phone] Fax: [phone]

## 2016-06-07 NOTE — Patient Instructions (Addendum)
We will be checking the following labs today - BMET   Medication Instructions:    Continue with your current medicines. BUT  I am adding Keflex 250 mg to take 4 times a day for one week  - this is at the drug store  Stay on the one fluid pill a day    Testing/Procedures To Be Arranged:  N/A  Follow-Up:   See me in 3 weeks  You should be hearing from Dr. Eddie Dibbles this week - call to confirm.    Other Special Instructions:   N/A    If you need a refill on your cardiac medications before your next appointment, please call your pharmacy.   Call the [place] office at [phone] if you have any questions, problems or concerns.

## 2016-06-08 LAB — BASIC METABOLIC PANEL
BUN/Creatinine Ratio: 18 (ref 10–24)
BUN: 22 mg/dL (ref 8–27)
CO2: 26 mmol/L (ref 18–29)
Calcium: 9.7 mg/dL (ref 8.6–10.2)
Chloride: 92 mmol/L — ABNORMAL LOW (ref 96–106)
Creatinine, Ser: 1.22 mg/dL (ref 0.76–1.27)
GFR calc Af Amer: 70 mL/min/{1.73_m2} (ref >59)
GFR calc non Af Amer: 61 mL/min/{1.73_m2} (ref >59)
Glucose: 103 mg/dL — ABNORMAL HIGH (ref 65–99)
Potassium: 4.9 mmol/L (ref 3.5–5.2)
Sodium: 135 mmol/L (ref 134–144)

## 2016-06-19 ENCOUNTER — Other Ambulatory Visit: Payer: Self-pay | Admitting: Nurse Practitioner

## 2016-06-22 DIAGNOSIS — M545 Low back pain: Secondary | ICD-10-CM | POA: Diagnosis not present

## 2016-06-22 DIAGNOSIS — M47817 Spondylosis without myelopathy or radiculopathy, lumbosacral region: Secondary | ICD-10-CM | POA: Diagnosis not present

## 2016-06-22 DIAGNOSIS — M48061 Spinal stenosis, lumbar region without neurogenic claudication: Secondary | ICD-10-CM | POA: Diagnosis not present

## 2016-06-22 DIAGNOSIS — M461 Sacroiliitis, not elsewhere classified: Secondary | ICD-10-CM | POA: Diagnosis not present

## 2016-06-23 DIAGNOSIS — I2510 Atherosclerotic heart disease of native coronary artery without angina pectoris: Secondary | ICD-10-CM | POA: Diagnosis not present

## 2016-06-23 DIAGNOSIS — R918 Other nonspecific abnormal finding of lung field: Secondary | ICD-10-CM | POA: Diagnosis not present

## 2016-06-23 DIAGNOSIS — K429 Umbilical hernia without obstruction or gangrene: Secondary | ICD-10-CM | POA: Diagnosis not present

## 2016-06-23 DIAGNOSIS — I714 Abdominal aortic aneurysm, without rupture: Secondary | ICD-10-CM | POA: Diagnosis not present

## 2016-06-23 DIAGNOSIS — I723 Aneurysm of iliac artery: Secondary | ICD-10-CM | POA: Diagnosis not present

## 2016-06-24 DIAGNOSIS — I716 Thoracoabdominal aortic aneurysm, without rupture: Secondary | ICD-10-CM | POA: Diagnosis not present

## 2016-06-24 DIAGNOSIS — I714 Abdominal aortic aneurysm, without rupture: Secondary | ICD-10-CM | POA: Diagnosis not present

## 2016-06-24 DIAGNOSIS — I739 Peripheral vascular disease, unspecified: Secondary | ICD-10-CM | POA: Diagnosis not present

## 2016-06-28 ENCOUNTER — Ambulatory Visit (INDEPENDENT_AMBULATORY_CARE_PROVIDER_SITE_OTHER): Payer: Medicare Other | Admitting: Nurse Practitioner

## 2016-06-28 ENCOUNTER — Encounter: Payer: Self-pay | Admitting: Nurse Practitioner

## 2016-06-28 VITALS — BP 140/80 | HR 66 | Ht 72.0 in | Wt 212.4 lb

## 2016-06-28 DIAGNOSIS — R609 Edema, unspecified: Secondary | ICD-10-CM | POA: Diagnosis not present

## 2016-06-28 DIAGNOSIS — M461 Sacroiliitis, not elsewhere classified: Secondary | ICD-10-CM | POA: Diagnosis not present

## 2016-06-28 DIAGNOSIS — M545 Low back pain: Secondary | ICD-10-CM | POA: Diagnosis not present

## 2016-06-28 LAB — BASIC METABOLIC PANEL
BUN/Creatinine Ratio: 16 (ref 10–24)
BUN: 23 mg/dL (ref 8–27)
CO2: 20 mmol/L (ref 18–29)
Calcium: 9.4 mg/dL (ref 8.6–10.2)
Chloride: 87 mmol/L — ABNORMAL LOW (ref 96–106)
Creatinine, Ser: 1.44 mg/dL — ABNORMAL HIGH (ref 0.76–1.27)
GFR calc Af Amer: 58 mL/min/{1.73_m2} — ABNORMAL LOW (ref >59)
GFR calc non Af Amer: 50 mL/min/{1.73_m2} — ABNORMAL LOW (ref >59)
Glucose: 88 mg/dL (ref 65–99)
Potassium: 4.3 mmol/L (ref 3.5–5.2)
Sodium: 125 mmol/L — ABNORMAL LOW (ref 134–144)

## 2016-06-28 MED ORDER — FUROSEMIDE 40 MG PO TABS: 20.0000 mg | ORAL_TABLET | Freq: Every day | ORAL | 3 refills | Status: DC

## 2016-06-28 NOTE — Progress Notes (Signed)
CARDIOLOGY OFFICE NOTE  Date:  06/28/2016    [name] Date of Birth: 1951-02-02 Medical Record #[id number]  PCP:  Donnie Coffin, MD  Cardiologist:  Jerel Shepherd  Chief Complaint  Patient presents with  . Edema  . Coronary Artery Disease    3 week check - seen for Dr. Burt Best    History of Present Illness: [name] is a 66 y.o. male who presents today for a follow up visit. This is a 3 week check. Seen for Dr. Burt Best. Former patient of Dr. Susa Best.   He has an extensive history of PVD and CAD. He was previously followed by Dr. Bridgett [facility] at [facility] but now seeing Vascular in [facility] with Dr. Sammuel Best. Prior cath in 2006 showing normal LV function with a totally occluded RCA with left to right collaterals. He has distal small vessel disease in the RCA with diffuse mild to moderate stenosis in the LAD. Former smoker. His PVD history is quite extensive and includes AAA (with no plans to intervene due to the nature of his aneurysm and his multiple comorbidities - last noted scan was 5.8 x 5.4 from 02/2015).   Other problems include HTN, HLD and OA. He has a history of hyponatremia as well.  He presented back in July of 2016 with chest pain/NSTEMI and underwent CABG x 5 by Dr. Cyndia Best. He had vein harvesting from the right lower extremity. He then presented with progressive right lower extremity swelling and discomfort. He followed up with the cardiac surgery office on 8/2 and was started on Lasix and potassium for bilateral lower extremity edema. He fell at home and he was on the floor for at least 2 hours before his wife found him and she brought him back to the ER.   He was found to have acute RLE DVT and PE. Was on IV heparin, transitioned to Xarelto due to drop in platelets, HIT panel + and serotonin release assay + as well and he was changed to Arixtra and seen by Dr. Alen Best. Warfarin started and he is to be on 6 months of therapy. He did require transfusion of 2 units  PRBCs.   I have seen him several times post op - little puny and slow to improve. Was still having considerable right leg pain - I put him on Ultram. Plavix stopped.   I saw him in early September of 2016 - he looked better but still with considerable pain in the right foot - most likely multifactorial - arterial/recent DVT/venous insufficiency. Was encouraged to see Dr. Sammuel Best by [facility] but this is a pretty big ordeal to go to [facility] and [name] felt that since there "was nothing to do then not to go". He was a little depressed. Had stopped his alcohol intake.   At his follow up visit in October of 2016 - he was much better - really back to his baseline. Had been back to [facility] - aneurysm had grown - but still no plan to intervene. Due to occluded common and external iliac artery - he would need an open aneurysm repair. Was told to "go live his life". Anxious to get back to some traveling within the [place]. He had been released from hematology - was to stop coumadin after total 6 months of therapy. Coumadin was stopped on March 1st and he was transitioned back to Plavix afterwards.   Seen several times lastspring - weight was up. He had been on steroids for allergic reaction  and eating "everything". BP sky high. I increased his medicines. Changed back to Coreg from Metoprolol. He was back drinking alcohol. ? How much salt he uses. Last seen back in [place] he was doing ok other than back pain. His BP cuff matches up pretty well. Probably not planning on going back to [facility] - always told "just go do your bucket list".  He has had several back injections since his last visit here. He has had imaging done by orthopedics - the aneurysm was noted - he was told to come back here. [name] and his wife had reiterated several times in the past that they do NOTwish to know the actualmeasurements.   I received a copy of the lumbar spine film - noted a 6.1 x 6.5 cm infrarenal aortic aneurysm. Vascular surgery referral  was recommended.   He is followed by Dr. Sammuel Best at [facility] - last measurement there was 5.8 x 5.4 - this was from October of 2016.   I saw him last week - lots of pain issues. Had been to ortho - had imaging with noted increase in his AAA. Referred back to Dr. Sammuel Best. Lots of swelling. Added back lasix. No DVT noted on duplex study.   I saw him several times in January - due to swelling - got him back on Lasix. Got him an appointment to go back to [facility]. Chronic pain issues. Cardiac status seemed improved.   Comes back today. Here with his wife. He has been back to [facility] - has had another CT scan - AAA is at 6.2 x 5.8 - now at the point that he needs intervention/surgery - not felt to be a good candidate for open repair - looking at trying to "get him stronger". Going back for further testing next week -sounds like to try and improve blood flow to his legs. Getting some type shot in his back later today by orthopedics. May be looking at going to neurosurgery. Still on Tramadol which seems to be helping in general. Does makes him confused and forgetful however. Overall, he looks better today. His swelling has improved - some open sores - seeing dermatology.   Past Medical History:  Diagnosis Date  . AAA (abdominal aortic aneurysm) ([place])   . Anxiety   . CHF (congestive heart failure) ([place])   . COPD (chronic obstructive pulmonary disease) ([place])   . Coronary artery disease   . GERD (gastroesophageal reflux disease)   . History of blood transfusion   . Hyperlipidemia   . Hypertension    takes meds daily  . Hyponatremia   . Lumbar degenerative disc disease   . Myocardial infarction   . PAD (peripheral artery disease) ([place])   . PONV (postoperative nausea and vomiting)   . Raynaud's disease /phenomenon   . Stroke [facility]) 10/15/2005    Past Surgical History:  Procedure Laterality Date  . ABDOMINAL ANGIOGRAM  03/30/2012  . ABDOMINAL AORTAGRAM N/A 03/30/2012   Procedure: ABDOMINAL Maxcine Ham;  Surgeon:  Conrad Winthrop, MD;  Location: [facility] CATH LAB;  Service: Cardiovascular;  Laterality: N/A;  . CARDIAC CATHETERIZATION  2006  . CARDIAC CATHETERIZATION N/A 12/09/2014   Procedure: Left Heart Cath and Coronary Angiography;  Surgeon: [name], MD;  Location: [facility] CV LAB;  Service: Cardiovascular;  Laterality: N/A;  . CAROTID ENDARTERECTOMY     bilateral  . CAROTID ENDARTERECTOMY  04/13/2005   Right and 06/03/2005 Left  . CORONARY ARTERY BYPASS GRAFT N/A 12/13/2014   Procedure: CORONARY ARTERY BYPASS  GRAFT times five using left internal mammary artery and endoscopic right leg saphenous vein harvest;  Surgeon: Gaye Pollack, MD;  Location: [facility] OR;  Service: Open Heart Surgery;  Laterality: N/A;  . HERNIA REPAIR    . ILIAC ARTERY STENT  10/15/2005    Left common and external   . INTRAOPERATIVE TRANSESOPHAGEAL ECHOCARDIOGRAM N/A 12/13/2014   Procedure: INTRAOPERATIVE TRANSESOPHAGEAL ECHOCARDIOGRAM;  Surgeon: Gaye Pollack, MD;  Location: [facility] OR;  Service: Open Heart Surgery;  Laterality: N/A;     Medications: Current Outpatient Prescriptions  Medication Sig Dispense Refill  . acetaminophen (TYLENOL) 325 MG tablet Take 650 mg by mouth every 6 (six) hours as needed for mild pain or headache. Reported on 07/22/2015    . aspirin 81 MG tablet Take 81 mg by mouth every evening.     . carvedilol (COREG) 25 MG tablet Take 1 tablet (25 mg total) by mouth 2 (two) times daily. 180 tablet 3  . cephALEXin (KEFLEX) 250 MG capsule Take 1 capsule (250 mg total) by mouth 4 (four) times daily. 28 capsule 0  . clobetasol ointment (TEMOVATE) 0.05 % apply to affected area twice a day if needed  0  . cloNIDine (CATAPRES) 0.1 MG tablet Take 1 tablet (0.1 mg total) by mouth at bedtime. 90 tablet 3  . clopidogrel (PLAVIX) 75 MG tablet take 1 tablet by mouth once daily 90 tablet 3  . folic acid (FOLVITE) 1 MG tablet Take 1 tablet (1 mg total) by mouth daily. 30 tablet 0  . furosemide (LASIX) 40 MG tablet Take 0.5  tablets (20 mg total) by mouth daily. Take 60 mg daily for 3 days, then 40 mg daily. 90 tablet 3  . HYDROcodone-acetaminophen (NORCO/VICODIN) 5-325 MG tablet take 1 tablet by mouth twice a day if needed for pain  0  . levocetirizine (XYZAL) 5 MG tablet TAKE ONE TABLET ONCE DAILY AS DIRECTED 30 tablet 5  . lisinopril (PRINIVIL,ZESTRIL) 20 MG tablet Take 1 tablet (20 mg total) by mouth 2 (two) times daily. 180 tablet 3  . NITROSTAT 0.4 MG SL tablet Place 0.4 mg under the tongue every 5 (five) minutes as needed. Chest pain  0  . ranitidine (ZANTAC) 300 MG tablet take 1 tablet by mouth at bedtime 30 tablet 11  . rosuvastatin (CRESTOR) 20 MG tablet take 1 tablet by mouth at bedtime 90 tablet 2  . Thiamine HCl (VITAMIN B-1) 100 MG tablet Take 100 mg by mouth daily.      . traMADol (ULTRAM) 50 MG tablet Take 1 tablet (50 mg total) by mouth every 6 (six) hours as needed. 30 tablet 0  . triamcinolone ointment (KENALOG) 0.1 % Apply 1 application topically 2 (two) times daily. 80 g 1   No current facility-administered medications for this visit.     Allergies: Allergies  Allergen Reactions  . Hctz [Hydrochlorothiazide] Other (See Comments)    Black out due to electrolytes  . Norvasc [Amlodipine Besylate] Swelling       . Pletal [Cilostazol] Diarrhea  . Heparin Other (See Comments)    HIT positive as of 12/31/14    Social History: The patient  reports that he quit smoking about 11 years ago. His smoking use included Cigarettes. He has never used smokeless tobacco. He reports that he drinks about 14.4 oz of alcohol per week . He reports that he does not use drugs.   Family History: The patient's family history includes Coronary artery disease (age of onset: 74) in his  father; Dementia in his mother; Heart disease in his brother and father; Hyperlipidemia in his brother and father; Hypertension in his brother and father; Other (age of onset: 24) in his mother; Peripheral vascular disease in his  father.   Review of Systems: Please see the history of present illness.   Otherwise, the review of systems is positive for none.   All other systems are reviewed and negative.   Physical Exam: VS:  BP 140/80   Pulse 66   Ht 6' (1.829 m)   Wt 212 lb 6.4 oz (96.3 kg)   SpO2 93%   BMI 28.81 kg/m  .  BMI Body mass index is 28.81 kg/m.  Wt Readings from Last 3 Encounters:  06/28/16 212 lb 6.4 oz (96.3 kg)  06/07/16 221 lb 12.8 oz (100.6 kg)  05/31/16 230 lb 6.4 oz (104.5 kg)    General: Pleasant. Chronically ill appearing. He is alert and in no acute distress. His weight is down 9 more pounds.  HEENT: Normal.  Neck: Supple, no JVD, carotid bruits, or masses noted.  Cardiac: Regular rate and rhythm. No murmurs, rubs, or gallops. Less edema.  Respiratory:  Lungs are clear to auscultation bilaterally with normal work of breathing.  GI: Soft and nontender.  MS: No deformity or atrophy. Gait and ROM intact.  Skin: Warm and dry. Color is normal.  Neuro:  Strength and sensation are intact and no gross focal deficits noted.  Psych: Alert, appropriate and with normal affect.   LABORATORY DATA:  EKG:  EKG is not ordered today.   Lab Results  Component Value Date   WBC 8.4 05/31/2016   HGB 12.7 (L) 10/14/2015   HCT 31.5 (L) 05/31/2016   PLT 187 05/31/2016   GLUCOSE 103 (H) 06/07/2016   CHOL 135 06/11/2015   TRIG 52 06/11/2015   HDL 69 06/11/2015   LDLCALC 56 06/11/2015   ALT 9 06/11/2015   AST 17 06/11/2015   NA 135 06/07/2016   K 4.9 06/07/2016   CL 92 (L) 06/07/2016   CREATININE 1.22 06/07/2016   BUN 22 06/07/2016   CO2 26 06/07/2016   TSH 0.60 08/05/2015   INR 1.8 07/14/2015   HGBA1C 5.4 12/12/2014    BNP (last 3 results)  Recent Labs  05/31/16 0950  BNP 605.7*    ProBNP (last 3 results) No results for input(s): PROBNP in the last 8760 hours.   Other Studies Reviewed Today:  Limted CT 06/23/2016 Impressions    --Limited study in the absence of IV  contrast.    --Mild interval enlargement of the infrarenal abdominal aneurysm measuring6.2 x 5.8 cm (previously 5.8 x 5.4 cm) .     --Stable left common iliac aneurysm.    --Moderate to severe atherosclerotic changes, as above, with patency and degree of stenosis unclear due to lack of contrast.    --Umbilical hernia including loop of bowel with no clear overlying skin, likely an ostomy however unclear by CT.         TEE Study Conclusions from 11/2014  - Left ventricle: Systolic function was mildly reduced. The  estimated ejection fraction was in the range of 45% to 50%.  Hypokinesis of the inferior myocardium. - Aortic valve: No evidence of vegetation. There was trivial  regurgitation. - Mitral valve: Systolic bowing without prolapse. There was mild  regurgitation. - Left atrium: No evidence of thrombus in the atrial cavity or  appendage. No evidence of thrombus in the appendage. - Atrial septum: No defect  or patent foramen ovale was identified. - Tricuspid valve: No evidence of vegetation.   Assessment/Plan:  1. Lower extremity edema/cellulitis - improved with low dose diuretic therapy - down 9 more pounds. Cutting Lasix back today. BMET today. Seeing dermatology as well.   2. Essential hypertension, benign -  Stable on current regimen.   3. Prior RLE DVT and PE -  treated with 6 months of Coumadin - this was stopped March 1st, 2017. Did have + HIT panel. No longer seeing Dr. Alen Best.   4. Thrombocytopenia  - Heparin induced clinically and HIT Ab panel positive - Serotonin release assay positive as well and hence HIT confirmed - Heparin is listed as an allergy for him  5.  CAD/recent NSTEMI/S/P CABG x 5 (12/13/14) - Continue beta blocker, crestor. Back on Plavix. No symptoms.   6.  PAD  -with chronic pain - not clear to me if this current issue is more vascular or nerve related. He is having more PVD testing with Dr. Sammuel Best next week.    7.  AAA  -Very complex and due to degree of calcification has been deemed not electively operable - has gotten back to see Dr. Sammuel Best. No plans for surgical intervention.   8. Back pain/right buttock pain- on chronic Ultram. Having some type of injection later today.   Current medicines are reviewed with the patient today.  The patient does not have concerns regarding medicines other than what has been noted above.  The following changes have been made:  See above.  Labs/ tests ordered today include:    Orders Placed This Encounter  Procedures  . Basic metabolic panel     Disposition:   FU with me in 3 months. I think overall he looks better today.    Patient is agreeable to this plan and will call if any problems develop in the interim.   SignedTruitt Merle, NP  06/28/2016 10:40 AM  [facility] [address] [place], [place]  [address] Phone: [phone] Fax: [phone]

## 2016-06-28 NOTE — Patient Instructions (Addendum)
We will be checking the following labs today - BMET   Medication Instructions:    Continue with your current medicines. BUT  Cut the Lasix back to just 1/2 tablet daily - 20 mg a day.     Testing/Procedures To Be Arranged:  N/A  Follow-Up:   See me in 3 months    Other Special Instructions:   N/A    If you need a refill on your cardiac medications before your next appointment, please call your pharmacy.   Call the [place] office at [phone] if you have any questions, problems or concerns.

## 2016-06-29 DIAGNOSIS — L989 Disorder of the skin and subcutaneous tissue, unspecified: Secondary | ICD-10-CM | POA: Diagnosis not present

## 2016-06-29 DIAGNOSIS — D0359 Melanoma in situ of other part of trunk: Secondary | ICD-10-CM | POA: Diagnosis not present

## 2016-06-29 DIAGNOSIS — L0109 Other impetigo: Secondary | ICD-10-CM | POA: Diagnosis not present

## 2016-06-29 DIAGNOSIS — L0889 Other specified local infections of the skin and subcutaneous tissue: Secondary | ICD-10-CM | POA: Diagnosis not present

## 2016-06-29 DIAGNOSIS — L2089 Other atopic dermatitis: Secondary | ICD-10-CM | POA: Diagnosis not present

## 2016-06-29 DIAGNOSIS — D485 Neoplasm of uncertain behavior of skin: Secondary | ICD-10-CM | POA: Diagnosis not present

## 2016-07-07 DIAGNOSIS — I70202 Unspecified atherosclerosis of native arteries of extremities, left leg: Secondary | ICD-10-CM | POA: Diagnosis not present

## 2016-07-07 DIAGNOSIS — I739 Peripheral vascular disease, unspecified: Secondary | ICD-10-CM | POA: Diagnosis not present

## 2016-07-07 DIAGNOSIS — R5383 Other fatigue: Secondary | ICD-10-CM | POA: Diagnosis not present

## 2016-07-07 DIAGNOSIS — I745 Embolism and thrombosis of iliac artery: Secondary | ICD-10-CM | POA: Diagnosis not present

## 2016-07-07 DIAGNOSIS — I252 Old myocardial infarction: Secondary | ICD-10-CM | POA: Diagnosis not present

## 2016-07-07 DIAGNOSIS — E785 Hyperlipidemia, unspecified: Secondary | ICD-10-CM | POA: Diagnosis not present

## 2016-07-07 DIAGNOSIS — I10 Essential (primary) hypertension: Secondary | ICD-10-CM | POA: Diagnosis not present

## 2016-07-07 DIAGNOSIS — Z87891 Personal history of nicotine dependence: Secondary | ICD-10-CM | POA: Diagnosis not present

## 2016-07-07 DIAGNOSIS — I714 Abdominal aortic aneurysm, without rupture: Secondary | ICD-10-CM | POA: Diagnosis not present

## 2016-07-07 DIAGNOSIS — I2510 Atherosclerotic heart disease of native coronary artery without angina pectoris: Secondary | ICD-10-CM | POA: Diagnosis not present

## 2016-07-13 DIAGNOSIS — D0359 Melanoma in situ of other part of trunk: Secondary | ICD-10-CM | POA: Diagnosis not present

## 2016-07-22 ENCOUNTER — Telehealth: Payer: Self-pay | Admitting: Nurse Practitioner

## 2016-07-22 DIAGNOSIS — I716 Thoracoabdominal aortic aneurysm, without rupture: Secondary | ICD-10-CM | POA: Diagnosis not present

## 2016-07-22 DIAGNOSIS — I5041 Acute combined systolic (congestive) and diastolic (congestive) heart failure: Secondary | ICD-10-CM | POA: Diagnosis not present

## 2016-07-26 ENCOUNTER — Other Ambulatory Visit: Payer: Self-pay | Admitting: Nurse Practitioner

## 2016-07-26 DIAGNOSIS — I480 Paroxysmal atrial fibrillation: Secondary | ICD-10-CM

## 2016-07-26 DIAGNOSIS — M545 Low back pain: Secondary | ICD-10-CM | POA: Diagnosis not present

## 2016-07-26 DIAGNOSIS — Z01810 Encounter for preprocedural cardiovascular examination: Secondary | ICD-10-CM

## 2016-07-26 DIAGNOSIS — M461 Sacroiliitis, not elsewhere classified: Secondary | ICD-10-CM | POA: Diagnosis not present

## 2016-07-26 DIAGNOSIS — I5032 Chronic diastolic (congestive) heart failure: Secondary | ICD-10-CM

## 2016-07-26 NOTE — Progress Notes (Signed)
Received note from patient's wife regarding testing that Dr. Sammuel Hines has requested - we will order echocardiogram.  He was also wanting a flow volume loop test - which we would call PFTs - we can do this here as well.  Orders placed.   Burtis Junes, RN, [facility] [address] [place], [place]  [phone]

## 2016-07-28 ENCOUNTER — Encounter (INDEPENDENT_AMBULATORY_CARE_PROVIDER_SITE_OTHER): Payer: Self-pay

## 2016-07-28 ENCOUNTER — Other Ambulatory Visit: Payer: Self-pay

## 2016-07-28 ENCOUNTER — Ambulatory Visit (HOSPITAL_COMMUNITY): Payer: Medicare Other | Attending: Cardiology

## 2016-07-28 DIAGNOSIS — I361 Nonrheumatic tricuspid (valve) insufficiency: Secondary | ICD-10-CM | POA: Diagnosis not present

## 2016-07-28 DIAGNOSIS — I501 Left ventricular failure: Secondary | ICD-10-CM | POA: Insufficient documentation

## 2016-07-28 DIAGNOSIS — I5032 Chronic diastolic (congestive) heart failure: Secondary | ICD-10-CM | POA: Diagnosis not present

## 2016-07-28 DIAGNOSIS — Z01810 Encounter for preprocedural cardiovascular examination: Secondary | ICD-10-CM

## 2016-07-28 DIAGNOSIS — Z01818 Encounter for other preprocedural examination: Secondary | ICD-10-CM | POA: Diagnosis not present

## 2016-07-28 DIAGNOSIS — I340 Nonrheumatic mitral (valve) insufficiency: Secondary | ICD-10-CM | POA: Insufficient documentation

## 2016-07-28 DIAGNOSIS — I480 Paroxysmal atrial fibrillation: Secondary | ICD-10-CM

## 2016-07-29 NOTE — Telephone Encounter (Signed)
°   New message  ° ° °pt verbalized that she is returning call to speak to rn °

## 2016-08-05 ENCOUNTER — Encounter (HOSPITAL_COMMUNITY): Payer: Medicare Other

## 2016-08-05 ENCOUNTER — Ambulatory Visit (HOSPITAL_COMMUNITY)
Admission: RE | Admit: 2016-08-05 | Discharge: 2016-08-05 | Disposition: A | Discharge status: Home / Self Care | Payer: Medicare Other | Source: Ambulatory Visit | Attending: Nurse Practitioner | Admitting: Nurse Practitioner

## 2016-08-05 DIAGNOSIS — Z01810 Encounter for preprocedural cardiovascular examination: Secondary | ICD-10-CM

## 2016-08-05 DIAGNOSIS — J849 Interstitial pulmonary disease, unspecified: Secondary | ICD-10-CM | POA: Diagnosis not present

## 2016-08-05 DIAGNOSIS — J449 Chronic obstructive pulmonary disease, unspecified: Secondary | ICD-10-CM | POA: Insufficient documentation

## 2016-08-05 DIAGNOSIS — I480 Paroxysmal atrial fibrillation: Secondary | ICD-10-CM | POA: Diagnosis present

## 2016-08-05 DIAGNOSIS — I5032 Chronic diastolic (congestive) heart failure: Secondary | ICD-10-CM

## 2016-08-05 LAB — PULMONARY FUNCTION TEST
DL/VA % pred: 45 %
DL/VA: 2.12 ml/min/mmHg/L
DLCO unc % pred: 36 %
DLCO unc: 12.3 ml/min/mmHg
FEF 25-75 Post: 1.82 L/sec
FEF 25-75 Pre: 1.79 L/sec
FEF2575-%Change-Post: 1 %
FEF2575-%Pred-Post: 68 %
FEF2575-%Pred-Pre: 67 %
FEV1-%Change-Post: -4 %
FEV1-%Pred-Post: 72 %
FEV1-%Pred-Pre: 75 %
FEV1-Post: 2.49 L
FEV1-Pre: 2.6 L
FEV1FVC-%Change-Post: 0 %
FEV1FVC-%Pred-Pre: 102 %
FEV6-%Change-Post: -1 %
FEV6-%Pred-Post: 75 %
FEV6-%Pred-Pre: 75 %
FEV6-Post: 3.32 L
FEV6-Pre: 3.35 L
FEV6FVC-%Change-Post: 1 %
FEV6FVC-%Pred-Post: 104 %
FEV6FVC-%Pred-Pre: 103 %
FVC-%Change-Post: -3 %
FVC-%Pred-Post: 71 %
FVC-%Pred-Pre: 73 %
FVC-Post: 3.33 L
FVC-Pre: 3.44 L
Post FEV1/FVC ratio: 75 %
Post FEV6/FVC ratio: 100 %
Pre FEV1/FVC ratio: 75 %
Pre FEV6/FVC Ratio: 98 %
RV % pred: 107 %
RV: 2.63 L
TLC % pred: 91 %
TLC: 6.6 L

## 2016-08-05 MED ORDER — ALBUTEROL SULFATE (2.5 MG/3ML) 0.083% IN NEBU
2.5000 mg | INHALATION_SOLUTION | Freq: Once | RESPIRATORY_TRACT | Status: AC
  Administered 2016-08-05: 2.5 mg via RESPIRATORY_TRACT

## 2016-08-13 NOTE — Telephone Encounter (Signed)
Not clear to me what is going on - we have sent everything.   Please send the echo and PFTs - may need to actually fax to [facility].   ? Give paper copy to the patient as well.

## 2016-08-13 NOTE — Telephone Encounter (Signed)
Spoke with patient's wife (DPR on file) and she states that Dr. Eddie Dibbles office did not receive a caopy of the patient's echo results from 07/28/16 and PFT results from 08/05/16. She asked that I resend those results to fax# [phone] attn: Margie. Echo and PFT results have been faxed again.

## 2016-08-13 NOTE — Telephone Encounter (Signed)
Follow up  Pts wife voiced [facility] did not receive pts results and they have cancelled pts appt until they receive results from [place].  Pts wife fax#: [phone], Attn: Lesleigh Noe

## 2016-08-19 DIAGNOSIS — I252 Old myocardial infarction: Secondary | ICD-10-CM | POA: Diagnosis not present

## 2016-08-19 DIAGNOSIS — I714 Abdominal aortic aneurysm, without rupture: Secondary | ICD-10-CM | POA: Diagnosis not present

## 2016-08-19 DIAGNOSIS — Z87891 Personal history of nicotine dependence: Secondary | ICD-10-CM | POA: Diagnosis not present

## 2016-08-19 DIAGNOSIS — I10 Essential (primary) hypertension: Secondary | ICD-10-CM | POA: Diagnosis not present

## 2016-08-19 DIAGNOSIS — E785 Hyperlipidemia, unspecified: Secondary | ICD-10-CM | POA: Diagnosis not present

## 2016-08-19 DIAGNOSIS — I2510 Atherosclerotic heart disease of native coronary artery without angina pectoris: Secondary | ICD-10-CM | POA: Diagnosis not present

## 2016-08-19 DIAGNOSIS — I771 Stricture of artery: Secondary | ICD-10-CM | POA: Diagnosis not present

## 2016-08-19 DIAGNOSIS — Z9889 Other specified postprocedural states: Secondary | ICD-10-CM | POA: Diagnosis not present

## 2016-08-31 DIAGNOSIS — M545 Low back pain: Secondary | ICD-10-CM | POA: Diagnosis not present

## 2016-08-31 DIAGNOSIS — K429 Umbilical hernia without obstruction or gangrene: Secondary | ICD-10-CM | POA: Diagnosis not present

## 2016-09-07 ENCOUNTER — Encounter: Payer: Self-pay | Admitting: Nurse Practitioner

## 2016-09-13 ENCOUNTER — Other Ambulatory Visit: Payer: Self-pay | Admitting: Cardiovascular Disease

## 2016-09-13 ENCOUNTER — Other Ambulatory Visit: Payer: Self-pay | Admitting: Allergy and Immunology

## 2016-09-13 DIAGNOSIS — T783XXA Angioneurotic edema, initial encounter: Secondary | ICD-10-CM

## 2016-09-14 ENCOUNTER — Telehealth: Payer: Self-pay | Admitting: Nurse Practitioner

## 2016-09-14 NOTE — Telephone Encounter (Signed)
Follow Up:   Pt's wife wants to know if clearance have been given to Dr Alroy Dust for pt to have Hernia surgery?Does pt need an appointment to be cleared for surgery?

## 2016-09-14 NOTE — Telephone Encounter (Signed)
Follow up    Pt wife says that she missed a call and is returning call.

## 2016-09-14 NOTE — Telephone Encounter (Signed)
I spoke with the pt's wife and made her aware that I will have to speak with Dr Burt Knack in regards to request for surgical clearance.  I do remember receiving an office note from Dr Alroy Dust in regards to whether or not cardiology felt like the pt could proceed with umbilical hernia surgery. I made the pt's wife aware that I will contact her later this week.

## 2016-09-14 NOTE — Telephone Encounter (Signed)
s/w pt's wife per DPR needs Dr. Burt Knack to call Dr. Alroy Dust, pt's PCP to have phone consultation about pt needing hernia surgery do to pain. Stated also needed Dr. Burt Knack to have a phone consultation with Dr. Sammuel Hines. About pt's aneurysm. Pt's wife does not think any of these phone consultation have been done and would like a call back please.  Will send to Theodosia Quay, RN. Stated would call pt back to f/u either today or tomorrow.  Dr. Alroy Dust is not in office today.

## 2016-09-17 NOTE — Telephone Encounter (Signed)
I spoke to the patient's wife at length. I think he could have surgery at relatively low cardiac risk. He just had bypass surgery 2 years ago and he is not having angina at present. However, I do think [name] careful thought has to be put into whether abdominal hernia repair makes sense in a patient who has a large abdominal aortic aneurysm who may require open surgery at some point in the future. Mrs. Anglemyer states that the patient's pain is better at present and they will hold off on surgery unless symptoms worsen.

## 2016-09-27 ENCOUNTER — Ambulatory Visit (INDEPENDENT_AMBULATORY_CARE_PROVIDER_SITE_OTHER): Payer: Medicare Other | Admitting: Nurse Practitioner

## 2016-09-27 ENCOUNTER — Encounter: Payer: Self-pay | Admitting: Nurse Practitioner

## 2016-09-27 VITALS — BP 128/64 | HR 69 | Ht 71.0 in | Wt 214.0 lb

## 2016-09-27 DIAGNOSIS — I714 Abdominal aortic aneurysm, without rupture, unspecified: Secondary | ICD-10-CM

## 2016-09-27 DIAGNOSIS — I5022 Chronic systolic (congestive) heart failure: Secondary | ICD-10-CM

## 2016-09-27 DIAGNOSIS — I10 Essential (primary) hypertension: Secondary | ICD-10-CM | POA: Diagnosis not present

## 2016-09-27 DIAGNOSIS — I25810 Atherosclerosis of coronary artery bypass graft(s) without angina pectoris: Secondary | ICD-10-CM

## 2016-09-27 DIAGNOSIS — R609 Edema, unspecified: Secondary | ICD-10-CM | POA: Diagnosis not present

## 2016-09-27 LAB — BASIC METABOLIC PANEL
BUN/Creatinine Ratio: 13 (ref 10–24)
BUN: 17 mg/dL (ref 8–27)
CO2: 22 mmol/L (ref 18–29)
Calcium: 8.8 mg/dL (ref 8.6–10.2)
Chloride: 88 mmol/L — ABNORMAL LOW (ref 96–106)
Creatinine, Ser: 1.31 mg/dL — ABNORMAL HIGH (ref 0.76–1.27)
GFR calc Af Amer: 64 mL/min/{1.73_m2} (ref >59)
GFR calc non Af Amer: 56 mL/min/{1.73_m2} — ABNORMAL LOW (ref >59)
Glucose: 96 mg/dL (ref 65–99)
Potassium: 4.5 mmol/L (ref 3.5–5.2)
Sodium: 124 mmol/L — ABNORMAL LOW (ref 134–144)

## 2016-09-27 MED ORDER — NITROSTAT 0.4 MG SL SUBL: 0.4000 mg | SUBLINGUAL_TABLET | SUBLINGUAL | 3 refills | Status: DC | PRN

## 2016-09-27 NOTE — Patient Instructions (Signed)
We will be checking the following labs today - BMET  Medication Instructions:    Continue with your current medicines.   I refilled the NTG today.     Testing/Procedures To Be Arranged:  N/A  Follow-Up:   See me in about 3 months.     Other Special Instructions:   N/A    If you need a refill on your cardiac medications before your next appointment, please call your pharmacy.   Call the [place] office at [phone] if you have any questions, problems or concerns.

## 2016-09-27 NOTE — Progress Notes (Signed)
CARDIOLOGY OFFICE NOTE  Date:  09/27/2016    [name] Date of Birth: 19-Mar-1951 Medical Record #[id number]  PCP:  [name], L.Marlou Sa, MD  Cardiologist:  Jerel Shepherd   Chief Complaint  Patient presents with  . Coronary Artery Disease    Follow up visit - seen for Dr. Burt Knack    History of Present Illness: [name] is a 66 y.o. male who presents today for a follow up visit. Seen for Dr. Burt Knack. Former patient of Dr. Susa Simmonds.   He has an extensive history of PVD and CAD. He was previously followed by Dr. Bridgett Larsson at [facility] but now seeing Vascular in [facility] with Dr. Sammuel Hines. Prior cath in 2006 showing normal LV function with a totally occluded RCA with left to right collaterals. He has distal small vessel disease in the RCA with diffuse mild to moderate stenosis in the LAD. Former smoker. His PVD history is quite extensive and includes AAA (with no plans to intervene due to the nature of his aneurysm and his multiple comorbidities - last noted scan was 5.8 x 5.4 from 02/2015).   Other problems include HTN, HLD and OA. He has a history of hyponatremia as well.  He presented back in July of 2016 with chest pain/NSTEMI and underwent CABG x 5 by Dr. Cyndia Bent. He had vein harvesting from the right lower extremity. He then presented with progressive right lower extremity swelling and discomfort. He followed up with the cardiac surgery office on 8/2 and was started on Lasix and potassium for bilateral lower extremity edema. He fell at home and he was on the floor for at least 2 hours before his wife found him and she brought him back to the ER.   He was found to have acute RLE DVT and PE. Was on IV heparin, transitioned to Xarelto due to drop in platelets, HIT panel + and serotonin release assay + as well and he was changed to Arixtra and seen by Dr. Alen Blew. Warfarin started and he is to be on 6 months of therapy. He did require transfusion of 2 units PRBCs.   I have seen him  several times post op - little puny and slow to improve. Was still having considerable right leg pain - I put him on Ultram. Plavix stopped.   I saw him in early September of 2016 - he looked better but still with considerable pain in the right foot - most likely multifactorial - arterial/recent DVT/venous insufficiency. Was encouraged to see Dr. Sammuel Hines by [facility] but this is a pretty big ordeal to go to [facility] and [name] felt that since there "was nothing to do then not to go". He was a little depressed. Had stopped his alcohol intake.   At his follow up visit in October of 2016 - he was much better - really back to his baseline. Had been back to [facility] - aneurysm had grown - but still no plan to intervene. Due to occluded common and external iliac artery - he would need an open aneurysm repair. Was told to "go live his life". Anxious to get back to some traveling within the [place]. He had been released from hematology - was to stop coumadin after total 6 months of therapy. Coumadin was stopped on March 1st and he was transitioned back to Plavix afterwards.   Seen several times lastspring - weight was up. He had been on steroids for allergic reaction and eating "everything". BP sky high. I increased  his medicines. Changed back to Coreg from Metoprolol. He was back drinking alcohol. ? How much salt he uses. Last seen back in [place] he was doing ok other than back pain. His BP cuff matches up pretty well. Probably not planning on going back to [facility] - always told "just go do your bucket list".  He has had several back injections since his last visit here. He has had imaging done by orthopedics - the aneurysm was noted - he was told to come back here. [name] and his wife had reiterated several times in the past that they do NOTwish to know the actualmeasurements.   I received a copy of the lumbar spine film - noted a 6.1 x 6.5 cm infrarenal aortic aneurysm. Vascular surgery referral was recommended.   He is  followed by Dr. Sammuel Hines at [facility] - last measurement there was 5.8 x 5.4 - this was from October of 2016.   I saw him last week - lots of pain issues. Had been to ortho - had imaging with noted increase in his AAA. Referred back to Dr. Sammuel Hines. Lots of swelling. Added back lasix. No DVT noted on duplex study.   I saw him several times in January - due to swelling - got him back on Lasix. Had cellulitis. Got him an appointment to go back to [facility]. Chronic pain issues. Cardiac status seemed improved.  Last seen by me back in February - his AAA is getting larger - was considering options. Still limited by back pain. On chronic Tramadol.   Comes back today. Here with his wife. They have decided to "not do anything". Has no more pain with the umbilical hernia. Telling me that the mesh (if it were put in - makes it difficult to fix the aneurysm). May try to fix if the aneurysm was to be fixed. At this time - NO PLAN TO FIX THE ANEURYSM. Says he is doing ok. Still on Tramadol as needed. She adjusts his Lasix by his swelling and how much discomfort he has in his legs from his swelling. No chest pain. Breathing is ok. Walking a "little better" - did not have to have a wheelchair to get in here today. She is happy with how he is doing. They are getting out most days - "going to the bars". Unclear how much alcohol he is drinking - no plans to stop.   Past Medical History:  Diagnosis Date  . AAA (abdominal aortic aneurysm) ([place])   . Anxiety   . CHF (congestive heart failure) ([place])   . COPD (chronic obstructive pulmonary disease) ([place])   . Coronary artery disease   . GERD (gastroesophageal reflux disease)   . History of blood transfusion   . Hyperlipidemia   . Hypertension    takes meds daily  . Hyponatremia   . Lumbar degenerative disc disease   . Myocardial infarction ([place])   . PAD (peripheral artery disease) ([place])   . PONV (postoperative nausea and vomiting)   . Raynaud's disease /phenomenon   . Stroke  [facility]) 10/15/2005    Past Surgical History:  Procedure Laterality Date  . ABDOMINAL ANGIOGRAM  03/30/2012  . ABDOMINAL AORTAGRAM N/A 03/30/2012   Procedure: ABDOMINAL Maxcine Ham;  Surgeon: Conrad Decatur, MD;  Location: [facility] CATH LAB;  Service: Cardiovascular;  Laterality: N/A;  . CARDIAC CATHETERIZATION  2006  . CARDIAC CATHETERIZATION N/A 12/09/2014   Procedure: Left Heart Cath and Coronary Angiography;  Surgeon: [name], MD;  Location: [facility] CV  LAB;  Service: Cardiovascular;  Laterality: N/A;  . CAROTID ENDARTERECTOMY     bilateral  . CAROTID ENDARTERECTOMY  04/13/2005   Right and 06/03/2005 Left  . CORONARY ARTERY BYPASS GRAFT N/A 12/13/2014   Procedure: CORONARY ARTERY BYPASS GRAFT times five using left internal mammary artery and endoscopic right leg saphenous vein harvest;  Surgeon: Gaye Pollack, MD;  Location: [facility] OR;  Service: Open Heart Surgery;  Laterality: N/A;  . HERNIA REPAIR    . ILIAC ARTERY STENT  10/15/2005    Left common and external   . INTRAOPERATIVE TRANSESOPHAGEAL ECHOCARDIOGRAM N/A 12/13/2014   Procedure: INTRAOPERATIVE TRANSESOPHAGEAL ECHOCARDIOGRAM;  Surgeon: Gaye Pollack, MD;  Location: [facility] OR;  Service: Open Heart Surgery;  Laterality: N/A;     Medications: Current Outpatient Prescriptions  Medication Sig Dispense Refill  . acetaminophen (TYLENOL) 325 MG tablet Take 650 mg by mouth every 6 (six) hours as needed for mild pain or headache. Reported on 07/22/2015    . aspirin 81 MG tablet Take 81 mg by mouth every evening.     . carvedilol (COREG) 25 MG tablet Take 1 tablet (25 mg total) by mouth 2 (two) times daily. 180 tablet 3  . clobetasol ointment (TEMOVATE) 0.05 % apply to affected area twice a day if needed  0  . cloNIDine (CATAPRES) 0.1 MG tablet Take 1 tablet (0.1 mg total) by mouth at bedtime. 90 tablet 3  . clopidogrel (PLAVIX) 75 MG tablet take 1 tablet by mouth once daily 90 tablet 3  . folic acid (FOLVITE) 1 MG tablet Take 1 tablet (1 mg total)  by mouth daily. 30 tablet 0  . furosemide (LASIX) 40 MG tablet Take 0.5  Tablet (20 mg) by mouth daily. May take and additional 0.5 tablet (20 mg) if needed for swelling.    . levocetirizine (XYZAL) 5 MG tablet TAKE ONE TABLET ONCE DAILY AS DIRECTED 30 tablet 5  . lisinopril (PRINIVIL,ZESTRIL) 5 MG tablet take 1 tablet by mouth once daily 30 tablet 9  . NITROSTAT 0.4 MG SL tablet Place 1 tablet (0.4 mg total) under the tongue every 5 (five) minutes as needed. Chest pain 25 tablet 3  . ranitidine (ZANTAC) 300 MG tablet take 1 tablet by mouth at bedtime 30 tablet 11  . rosuvastatin (CRESTOR) 20 MG tablet take 1 tablet by mouth at bedtime 90 tablet 2  . Thiamine HCl (VITAMIN B-1) 100 MG tablet Take 100 mg by mouth daily.      . traMADol (ULTRAM) 50 MG tablet Take 1 tablet (50 mg total) by mouth every 6 (six) hours as needed. 30 tablet 0  . triamcinolone ointment (KENALOG) 0.1 % Apply 1 application topically 2 (two) times daily. 80 g 1   No current facility-administered medications for this visit.     Allergies: Allergies  Allergen Reactions  . Hctz [Hydrochlorothiazide] Other (See Comments)    Black out due to electrolytes  . Norvasc [Amlodipine Besylate] Swelling       . Pletal [Cilostazol] Diarrhea  . Heparin Other (See Comments)    HIT positive as of 12/31/14    Social History: The patient  reports that he quit smoking about 11 years ago. His smoking use included Cigarettes. He has never used smokeless tobacco. He reports that he drinks about 14.4 oz of alcohol per week . He reports that he does not use drugs.   Family History: The patient's family history includes Coronary artery disease (age of onset: 42) in his father; Dementia  in his mother; Heart disease in his brother and father; Hyperlipidemia in his brother and father; Hypertension in his brother and father; Other (age of onset: 28) in his mother; Peripheral vascular disease in his father.   Review of Systems: Please see the  history of present illness.   Otherwise, the review of systems is positive for back pain.   All other systems are reviewed and negative.   Physical Exam: VS:  BP 128/64   Pulse 69   Ht 5\' 11"  (1.803 m)   Wt 214 lb (97.1 kg)   SpO2 94%   BMI 29.85 kg/m  .  BMI Body mass index is 29.85 kg/m.  Wt Readings from Last 3 Encounters:  09/27/16 214 lb (97.1 kg)  06/28/16 212 lb 6.4 oz (96.3 kg)  06/07/16 221 lb 12.8 oz (100.6 kg)    General: Pleasant. Chronically ill. He is alert and in no acute distress.   HEENT: Normal.  Neck: Supple, no JVD, carotid bruits, or masses noted.  Cardiac: Regular rate and rhythm. No murmurs, rubs, or gallops. 2+ edema.  Respiratory:  Lungs are clear to auscultation bilaterally with normal work of breathing.  GI: Soft and nontender.  MS: No deformity or atrophy. Gait and ROM intact.  Skin: Warm and dry. Color is normal.  Neuro:  Strength and sensation are intact and no gross focal deficits noted.  Psych: Alert, appropriate and with normal affect.   LABORATORY DATA:  EKG:  EKG is not ordered today.  Lab Results  Component Value Date   WBC 8.4 05/31/2016   HGB 12.7 (L) 10/14/2015   HCT 31.5 (L) 05/31/2016   PLT 187 05/31/2016   GLUCOSE 88 06/28/2016   CHOL 135 06/11/2015   TRIG 52 06/11/2015   HDL 69 06/11/2015   LDLCALC 56 06/11/2015   ALT 9 06/11/2015   AST 17 06/11/2015   NA 125 (L) 06/28/2016   K 4.3 06/28/2016   CL 87 (L) 06/28/2016   CREATININE 1.44 (H) 06/28/2016   BUN 23 06/28/2016   CO2 20 06/28/2016   TSH 0.60 08/05/2015   INR 1.8 07/14/2015   HGBA1C 5.4 12/12/2014    BNP (last 3 results)  Recent Labs  05/31/16 0950  BNP 605.7*    ProBNP (last 3 results) No results for input(s): PROBNP in the last 8760 hours.   Other Studies Reviewed Today:  Limted CT 06/23/2016 Impressions    --Limited study in the absence of IV contrast.    --Mild interval enlargement of the infrarenal abdominal aneurysm measuring6.2 x  5.8 cm (previously 5.8 x 5.4 cm) .     --Stable left common iliac aneurysm.    --Moderate to severe atherosclerotic changes, as above, with patency and degree of stenosis unclear due to lack of contrast.    --Umbilical hernia including loop of bowel with no clear overlying skin, likely an ostomy however unclear by CT.         TEE Study Conclusions from 11/2014  - Left ventricle: Systolic function was mildly reduced. The  estimated ejection fraction was in the range of 45% to 50%.  Hypokinesis of the inferior myocardium. - Aortic valve: No evidence of vegetation. There was trivial  regurgitation. - Mitral valve: Systolic bowing without prolapse. There was mild  regurgitation. - Left atrium: No evidence of thrombus in the atrial cavity or  appendage. No evidence of thrombus in the appendage. - Atrial septum: No defect or patent foramen ovale was identified. - Tricuspid valve: No evidence  of vegetation.   Assessment/Plan:  1. Lower extremity edema/cellulitis/chronic systolic HF - adjusting Lasix based on weight and swelling. BMET today. They are both happy with how he is doing.   2. Essential hypertension, benign -  Stable on current regimen. I have left him on his current regimen for now.   3. Prior RLE DVT and PE -  treated with 6 months of Coumadin - this was stopped March 1st, 2017. Did have + HIT panel. No longer seeing Dr. Alen Blew.   4. Thrombocytopenia  - Heparin induced clinically and HIT Ab panel positive - Serotonin release assay positive as well and hence HIT confirmed - Heparin is listed as an allergy for him  5.  CAD/recent NSTEMI/S/P CABG x 5 (12/13/14) - Continue beta blocker, crestor. Back on Plavix. No symptoms of angina.   6.  PAD  -followed by Dr. Sammuel Hines   7.  AAA  -Very complex and due to degree of calcification has been deemed not electively operable - has gotten back to see Dr. Sammuel Hines. No plans for surgical intervention  at this time.   8. Back pain/right buttock pain- on chronic Ultram.   9. Umbilical hernia - no plans to intervene on.  Current medicines are reviewed with the patient today.  The patient does not have concerns regarding medicines other than what has been noted above.  The following changes have been made:  See above.  Labs/ tests ordered today include:    Orders Placed This Encounter  Procedures  . Basic metabolic panel     Disposition:   FU with me in 3 months.   Patient is agreeable to this plan and will call if any problems develop in the interim.   SignedTruitt Merle, NP  09/27/2016 9:09 AM  [facility] [address] [place], [place]  [address] Phone: [phone] Fax: [phone]

## 2016-10-01 ENCOUNTER — Other Ambulatory Visit: Payer: Self-pay | Admitting: *Deleted

## 2016-10-01 DIAGNOSIS — I10 Essential (primary) hypertension: Secondary | ICD-10-CM

## 2016-10-11 ENCOUNTER — Other Ambulatory Visit: Payer: Medicare Other

## 2016-10-11 DIAGNOSIS — I10 Essential (primary) hypertension: Secondary | ICD-10-CM

## 2016-10-11 LAB — BASIC METABOLIC PANEL
BUN/Creatinine Ratio: 18 (ref 10–24)
BUN: 31 mg/dL — ABNORMAL HIGH (ref 8–27)
CO2: 18 mmol/L (ref 18–29)
Calcium: 9 mg/dL (ref 8.6–10.2)
Chloride: 90 mmol/L — ABNORMAL LOW (ref 96–106)
Creatinine, Ser: 1.76 mg/dL — ABNORMAL HIGH (ref 0.76–1.27)
GFR calc Af Amer: 45 mL/min/{1.73_m2} — ABNORMAL LOW (ref >59)
GFR calc non Af Amer: 39 mL/min/{1.73_m2} — ABNORMAL LOW (ref >59)
Glucose: 93 mg/dL (ref 65–99)
Potassium: 4.7 mmol/L (ref 3.5–5.2)
Sodium: 122 mmol/L — ABNORMAL LOW (ref 134–144)

## 2016-10-12 DIAGNOSIS — E871 Hypo-osmolality and hyponatremia: Secondary | ICD-10-CM | POA: Diagnosis not present

## 2016-10-26 DIAGNOSIS — E871 Hypo-osmolality and hyponatremia: Secondary | ICD-10-CM | POA: Diagnosis not present

## 2016-10-26 DIAGNOSIS — K429 Umbilical hernia without obstruction or gangrene: Secondary | ICD-10-CM | POA: Diagnosis not present

## 2016-10-26 DIAGNOSIS — R05 Cough: Secondary | ICD-10-CM | POA: Diagnosis not present

## 2016-11-22 DIAGNOSIS — D649 Anemia, unspecified: Secondary | ICD-10-CM | POA: Diagnosis not present

## 2016-11-25 DIAGNOSIS — D649 Anemia, unspecified: Secondary | ICD-10-CM | POA: Diagnosis not present

## 2016-11-25 DIAGNOSIS — E871 Hypo-osmolality and hyponatremia: Secondary | ICD-10-CM | POA: Diagnosis not present

## 2016-12-01 DIAGNOSIS — I2510 Atherosclerotic heart disease of native coronary artery without angina pectoris: Secondary | ICD-10-CM | POA: Diagnosis not present

## 2016-12-01 DIAGNOSIS — I739 Peripheral vascular disease, unspecified: Secondary | ICD-10-CM | POA: Diagnosis not present

## 2016-12-01 DIAGNOSIS — D649 Anemia, unspecified: Secondary | ICD-10-CM | POA: Diagnosis not present

## 2016-12-01 DIAGNOSIS — I719 Aortic aneurysm of unspecified site, without rupture: Secondary | ICD-10-CM | POA: Diagnosis not present

## 2016-12-01 DIAGNOSIS — R195 Other fecal abnormalities: Secondary | ICD-10-CM | POA: Diagnosis not present

## 2016-12-01 DIAGNOSIS — K429 Umbilical hernia without obstruction or gangrene: Secondary | ICD-10-CM | POA: Diagnosis not present

## 2016-12-01 DIAGNOSIS — Z86010 Personal history of colonic polyps: Secondary | ICD-10-CM | POA: Diagnosis not present

## 2016-12-14 DIAGNOSIS — D649 Anemia, unspecified: Secondary | ICD-10-CM | POA: Diagnosis not present

## 2016-12-19 ENCOUNTER — Other Ambulatory Visit: Payer: Self-pay | Admitting: Nurse Practitioner

## 2016-12-19 ENCOUNTER — Other Ambulatory Visit: Payer: Self-pay | Admitting: Cardiovascular Disease

## 2016-12-22 ENCOUNTER — Ambulatory Visit: Payer: Medicare Other | Admitting: Nurse Practitioner

## 2016-12-22 NOTE — Progress Notes (Deleted)
CARDIOLOGY OFFICE NOTE  Date:  12/22/2016    [name] Date of Birth: [birth year] Medical Record #[id number]  PCP:  Alroy Dust, L.Marlou Sa, MD  Cardiologist:  Servando Snare & ***    No chief complaint on file.   History of Present Illness: [name] is a 66 y.o. male who presents today for a ***  Seen for Dr. Burt Knack. Former patient of Dr. Susa Simmonds.   He has an extensive history of PVD and CAD. He was previously followed by Dr. Bridgett Larsson at [facility] but now seeing [facility]ascular in [place] with Dr. Sammuel Hines. Prior cath in 2006 showing normal LV function with a totally occluded RCA with left to right collaterals. He has distal small vessel disease in the RCA with diffuse mild to moderate stenosis in the LAD. Former smoker. His PVD history is quite extensive and includes AAA (with no plans to intervene due to the nature of his aneurysm and his multiple comorbidities - last noted scan was 5.8 x 5.4 from 02/2015).   Other problems include HTN, HLD and OA. He has a history of hyponatremia as well.  He presented back in July of 2016 with chest pain/NSTEMI and underwent CABG x 5 by Dr. Cyndia Bent. He had vein harvesting from the right lower extremity. He then presented with progressive right lower extremity swelling and discomfort. He followed up with the cardiac surgery office on 8/2 and was started on Lasix and potassium for bilateral lower extremity edema. He fell at home and he was on the floor for at least 2 hours before his wife found him and she brought him back to the ER.   He was found to have acute RLE DVT and PE. Was on IV heparin, transitioned to Xarelto due to drop in platelets, HIT panel + and serotonin release assay + as well and he was changed to Arixtra and seen by Dr. Alen Blew. Warfarin started and he is to be on 6 months of therapy. He did require transfusion of 2 units PRBCs.   I have seen him several times post op - little puny and slow to improve. Was still having considerable right  leg pain - I put him on Ultram. Plavix stopped.   I saw him in early September of 2016 - he looked better but still with considerable pain in the right foot - most likely multifactorial - arterial/recent DVT/venous insufficiency. Was encouraged to see Dr. Sammuel Hines by [facility] but this is a pretty big ordeal to go to [facility] and [place] felt that since there "was nothing to do then not to go". He was a little depressed. Had stopped his alcohol intake.   At his follow up visit in October of 2016 - he was much better - really back to his baseline. Had been back to [facility] - aneurysm had grown - but still no plan to intervene. Due to occluded common and external iliac artery - he would need an open aneurysm repair. Was told to "go live his life". Anxious to get back to some traveling within the [place]. He had been released from hematology - was to stop coumadin after total 6 months of therapy. Coumadin was stopped on March 1st and he was transitioned back to Plavix afterwards.   Seen several times lastspring - weight was up. He had been on steroids for allergic reaction and eating "everything". BP sky high. I increased his medicines. Changed back to Coreg from Metoprolol. He was back drinking alcohol. ? How much salt he  uses. Last seen back in [place] he was doing ok other than back pain. His BP cuff matches up pretty well. Probably not planning on going back to [facility] - always told "just go do your bucket list".  He has had several back injections since his last visit here. He has had imaging done by orthopedics - the aneurysm wasnoted - he was told to come back here. Manny and his wife had reiterated several times in the past that they do NOTwish to know the actualmeasurements.   I received a copy of the lumbar spine film - noted a 6.1 x 6.5 cm infrarenal aortic aneurysm. Vascular surgery referral was recommended.   He is followed by Dr. Sammuel Hines at [facility] - last measurement there was 5.8 x 5.4 - this was from October  of 2016.   I saw him last week - lots of pain issues. Had been to ortho - had imaging with noted increase in his AAA. Referred back to Dr. Sammuel Hines. Lots of swelling. Added back lasix. No DVT noted on duplex study.   I saw him several times in January - due to swelling - got him back on Lasix. Had cellulitis. Got him an appointment to go back to [facility]. Chronic pain issues. Cardiac status seemed improved.  Last seen by me back in February - his AAA is getting larger - was considering options. Still limited by back pain. On chronic Tramadol.   Comes back today. Here with his wife. They have decided to "not do anything". Has no more pain with the umbilical hernia. Telling me that the mesh (if it were put in - makes it difficult to fix the aneurysm). May try to fix if the aneurysm was to be fixed. At this time - NO PLAN TO FIX THE ANEURYSM. Says he is doing ok. Still on Tramadol as needed. She adjusts his Lasix by his swelling and how much discomfort he has in his legs from his swelling. No chest pain. Breathing is ok. Walking a "little better" - did not have to have a wheelchair to get in here today. She is happy with how he is doing. They are getting out most days - "going to the bars". Unclear how much alcohol he is drinking - no plans to stop.  Comes in today. Here with   Past Medical History:  Diagnosis Date  . AAA (abdominal aortic aneurysm) ([place])   . Anxiety   . CHF (congestive heart failure) ([place])   . COPD (chronic obstructive pulmonary disease) ([place])   . Coronary artery disease   . GERD (gastroesophageal reflux disease)   . History of blood transfusion   . Hyperlipidemia   . Hypertension    takes meds daily  . Hyponatremia   . Lumbar degenerative disc disease   . Myocardial infarction ([place])   . PAD (peripheral artery disease) ([place])   . PONV (postoperative nausea and vomiting)   . Raynaud's disease /phenomenon   . Stroke [facility]) 10/15/2005    Past Surgical History:  Procedure  Laterality Date  . ABDOMINAL ANGIOGRAM  03/30/2012  . ABDOMINAL AORTAGRAM N/A 03/30/2012   Procedure: ABDOMINAL Maxcine Ham;  Surgeon: Conrad Laconia, MD;  Location: [facility] CATH LAB;  Service: Cardiovascular;  Laterality: N/A;  . CARDIAC CATHETERIZATION  2006  . CARDIAC CATHETERIZATION N/A 12/09/2014   Procedure: Left Heart Cath and Coronary Angiography;  Surgeon: [name], MD;  Location: [facility] CV LAB;  Service: Cardiovascular;  Laterality: N/A;  . CAROTID ENDARTERECTOMY  bilateral  . CAROTID ENDARTERECTOMY  04/13/2005   Right and 06/03/2005 Left  . CORONARY ARTERY BYPASS GRAFT N/A 12/13/2014   Procedure: CORONARY ARTERY BYPASS GRAFT times five using left internal mammary artery and endoscopic right leg saphenous vein harvest;  Surgeon: Gaye Pollack, MD;  Location: [facility] OR;  Service: Open Heart Surgery;  Laterality: N/A;  . HERNIA REPAIR    . ILIAC ARTERY STENT  10/15/2005    Left common and external   . INTRAOPERATIVE TRANSESOPHAGEAL ECHOCARDIOGRAM N/A 12/13/2014   Procedure: INTRAOPERATIVE TRANSESOPHAGEAL ECHOCARDIOGRAM;  Surgeon: Gaye Pollack, MD;  Location: [facility] OR;  Service: Open Heart Surgery;  Laterality: N/A;     Medications: No outpatient prescriptions have been marked as taking for the 12/22/16 encounter (Appointment) with Burtis Junes, NP.     Allergies: Allergies  Allergen Reactions  . Hctz [Hydrochlorothiazide] Other (See Comments)    Black out due to electrolytes  . Norvasc [Amlodipine Besylate] Swelling       . Pletal [Cilostazol] Diarrhea  . Heparin Other (See Comments)    HIT positive as of 12/31/14    Social History: The patient  reports that he quit smoking about 11 years ago. His smoking use included Cigarettes. He has never used smokeless tobacco. He reports that he drinks about 14.4 oz of alcohol per week . He reports that he does not use drugs.   Family History: The patient's ***family history includes Coronary artery disease (age of onset: 33) in his  father; Dementia in his mother; Heart disease in his brother and father; Hyperlipidemia in his brother and father; Hypertension in his brother and father; Other (age of onset: 40) in his mother; Peripheral vascular disease in his father.   Review of Systems: Please see the history of present illness.   Otherwise, the review of systems is positive for {NONE DEFAULTED:18576::"none"}.   All other systems are reviewed and negative.   Physical Exam: VS:  There were no vitals taken for this visit. Marland Kitchen  BMI There is no height or weight on file to calculate BMI.  Wt Readings from Last 3 Encounters:  09/27/16 214 lb (97.1 kg)  06/28/16 212 lb 6.4 oz (96.3 kg)  06/07/16 221 lb 12.8 oz (100.6 kg)    General: Pleasant. Well developed, well nourished and in no acute distress.   HEENT: Normal.  Neck: Supple, no JVD, carotid bruits, or masses noted.  Cardiac: ***Regular rate and rhythm. No murmurs, rubs, or gallops. No edema.  Respiratory:  Lungs are clear to auscultation bilaterally with normal work of breathing.  GI: Soft and nontender.  MS: No deformity or atrophy. Gait and ROM intact.  Skin: Warm and dry. Color is normal.  Neuro:  Strength and sensation are intact and no gross focal deficits noted.  Psych: Alert, appropriate and with normal affect.   LABORATORY DATA:  EKG:  EKG {ACTION; IS/IS LFY:10175102} ordered today. This demonstrates ***.  Lab Results  Component Value Date   WBC 8.4 05/31/2016   HGB 11.2 (L) 05/31/2016   HCT 31.5 (L) 05/31/2016   PLT 187 05/31/2016   GLUCOSE 93 10/11/2016   CHOL 135 06/11/2015   TRIG 52 06/11/2015   HDL 69 06/11/2015   LDLCALC 56 06/11/2015   ALT 9 06/11/2015   AST 17 06/11/2015   NA 122 (L) 10/11/2016   K 4.7 10/11/2016   CL 90 (L) 10/11/2016   CREATININE 1.76 (H) 10/11/2016   BUN 31 (H) 10/11/2016   CO2 18 10/11/2016  TSH 0.60 08/05/2015   INR 1.8 07/14/2015   HGBA1C 5.4 12/12/2014     BNP (last 3 results)  Recent Labs   05/31/16 0950  BNP 605.7*    ProBNP (last 3 results) No results for input(s): PROBNP in the last 8760 hours.   Other Studies Reviewed Today:  Limted CT 06/23/2016 Impressions    --Limited study in the absence of IV contrast.    --Mild interval enlargement of the infrarenal abdominal aneurysm measuring6.2 x 5.8 cm (previously 5.8 x 5.4 cm) .     --Stable left common iliac aneurysm.    --Moderate to severe atherosclerotic changes, as above, with patency and degree of stenosis unclear due to lack of contrast.    --Umbilical hernia including loop of bowel with no clear overlying skin, likely an ostomy however unclear by CT.         TEE Study Conclusions from 11/2014  - Left ventricle: Systolic function was mildly reduced. The  estimated ejection fraction was in the range of 45% to 50%.  Hypokinesis of the inferior myocardium. - Aortic valve: No evidence of vegetation. There was trivial  regurgitation. - Mitral valve: Systolic bowing without prolapse. There was mild  regurgitation. - Left atrium: No evidence of thrombus in the atrial cavity or  appendage. No evidence of thrombus in the appendage. - Atrial septum: No defect or patent foramen ovale was identified. - Tricuspid valve: No evidence of vegetation.   Assessment/Plan:  1. Lower extremity edema/cellulitis/chronic systolic HF - adjusting Lasix based on weight and swelling. BMET today. They are both happy with how he is doing.   2. Essential hypertension, benign - Stable on current regimen. I have left him on his current regimen for now.   3. Prior RLE DVT and PE - treated with 6 months of Coumadin - this was stopped March 1st, 2017. Did have + HIT panel. No longer seeing Dr. Alen Blew.   4. Thrombocytopenia  - Heparin induced clinically and HIT Ab panel positive - Serotonin release assay positive as well and hence HIT confirmed - Heparin is listed as an allergy for him  5.  CAD/recent NSTEMI/S/P CABG x 5 (12/13/14) - Continue beta blocker, crestor. Back on Plavix. No symptoms of angina.   6.  PAD  -followed by Dr. Sammuel Hines   7. AAA  -Very complex and due to degree of calcification has been deemed not electively operable - has gotten back to see Dr. Sammuel Hines. No plans for surgical intervention at this time.   8. Back pain/right buttock pain- on chronic Ultram.   9. Umbilical hernia - no plans to intervene on.   Current medicines are reviewed with the patient today.  The patient does not have concerns regarding medicines other than what has been noted above.  The following changes have been made:  See above.  Labs/ tests ordered today include:   No orders of the defined types were placed in this encounter.    Disposition:   FU with *** in {gen number 3-35:456256} {Days to years:10300}.   Patient is agreeable to this plan and will call if any problems develop in the interim.   SignedTruitt Merle, NP  12/22/2016 7:40 AM  [facility] [address] [place],   [address] Phone: [phone] Fax: [phone]

## 2017-01-02 IMAGING — CT CT ANGIO CHEST
2 of 9 series · 17 of 46 positions shown · IV contrast (Omni 300)
Comparison: Chest radiograph 12/27/2014 and chest CT 04/14/2012

CLINICAL DATA: Recent CABG procedure. Progressive right lower
extremity swelling and discomfort. Right lower extremity DVT.
Patient has dyspnea.

EXAM:
CT ANGIOGRAPHY CHEST WITH CONTRAST
TECHNIQUE: Multidetector CT imaging of the chest was performed using the
standard protocol during bolus administration of intravenous
contrast. Multiplanar CT image reconstructions and MIPs were
obtained to evaluate the vascular anatomy.
CONTRAST:  80mL OMNIPAQUE IOHEXOL 350 MG/ML SOLN

[Series 5: thins · axial · 0.78mm/px · z∈[-591,-320]mm · 14 of 305 slices shown]
[im 17/305  lung]
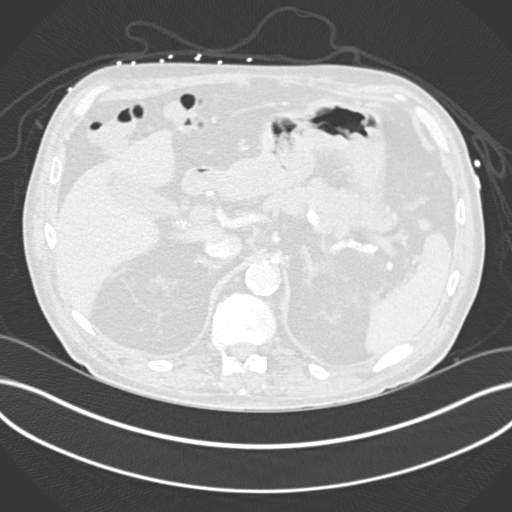
[im 34/305  soft-tissue]
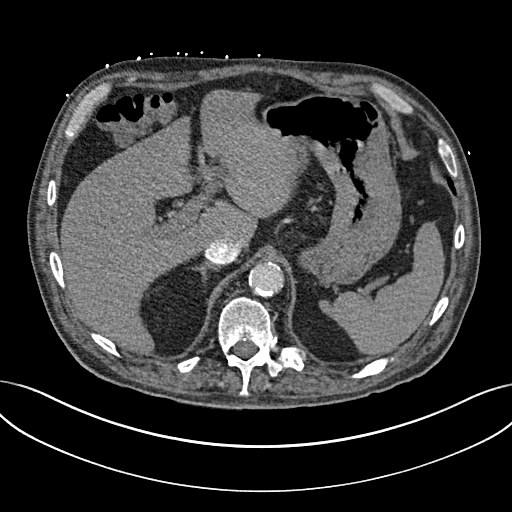
[im 68/305  lung]
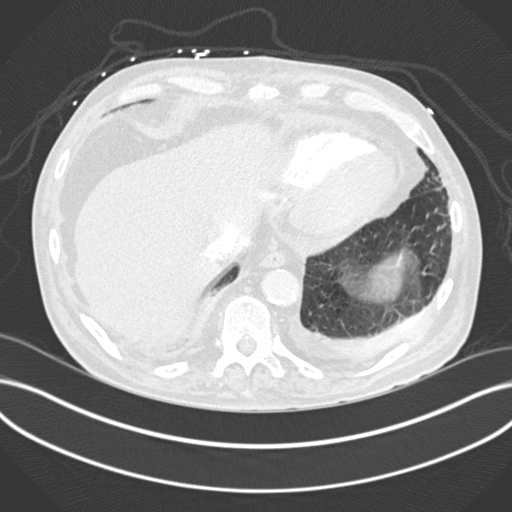
[im 85/305  soft-tissue]
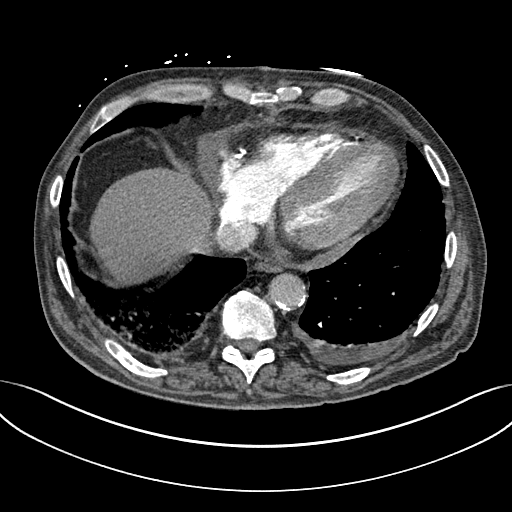
[im 102/305  lung]
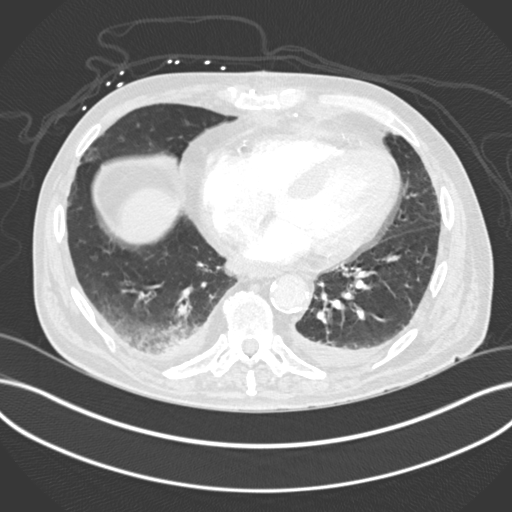
[im 119/305  soft-tissue]
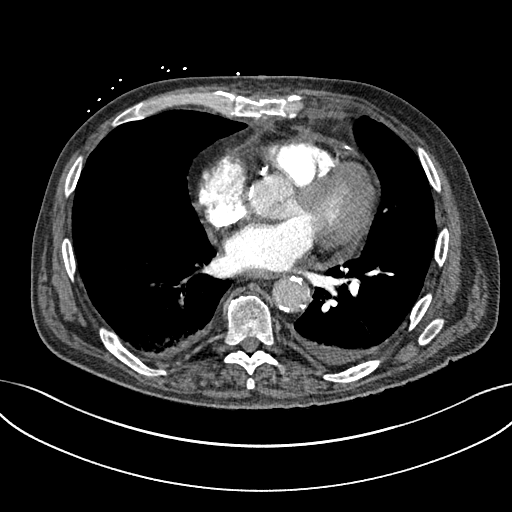
[im 136/305  lung]
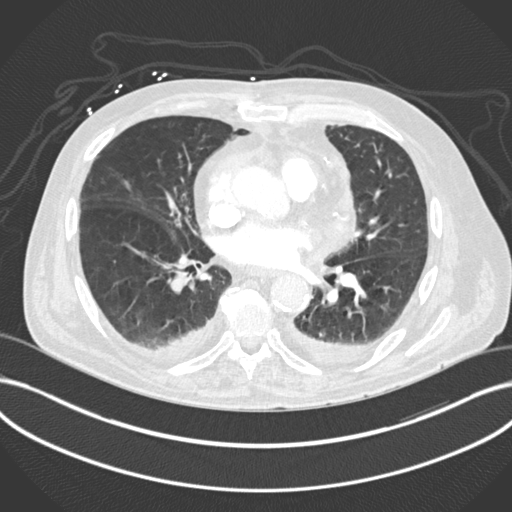
[im 169/305  soft-tissue]
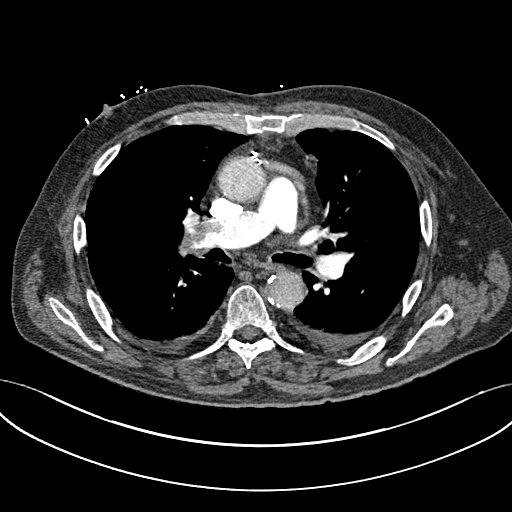
[im 186/305  lung]
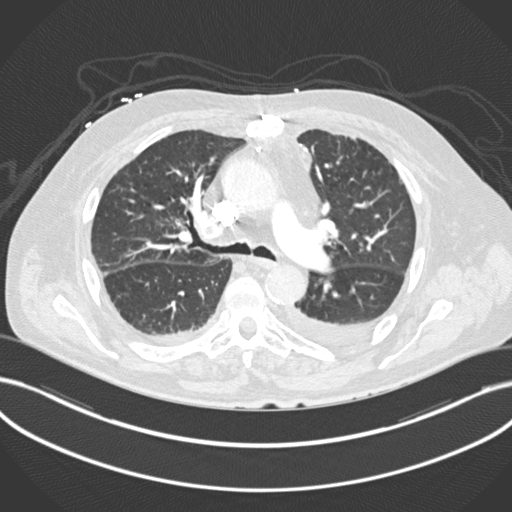
[im 203/305  soft-tissue]
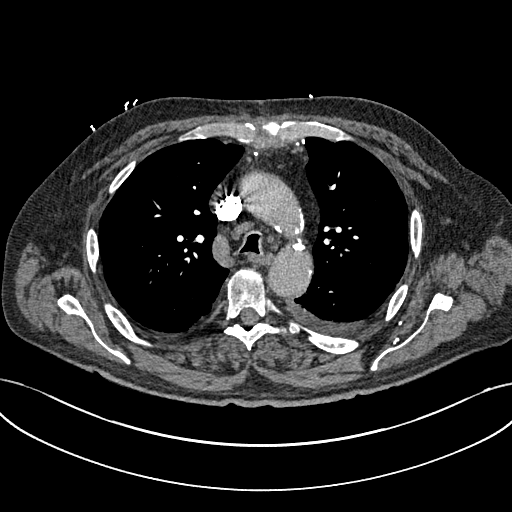
[im 220/305  lung]
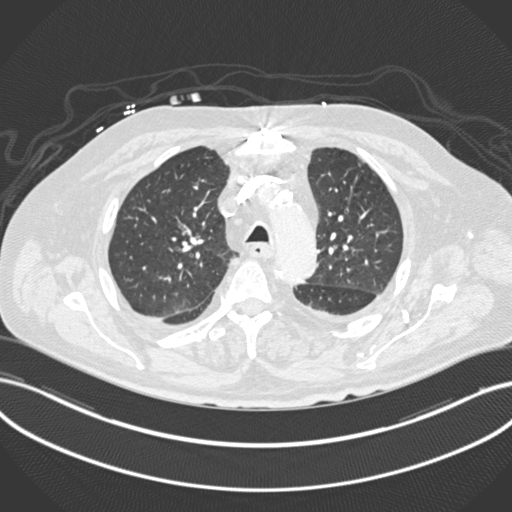
[im 237/305  soft-tissue]
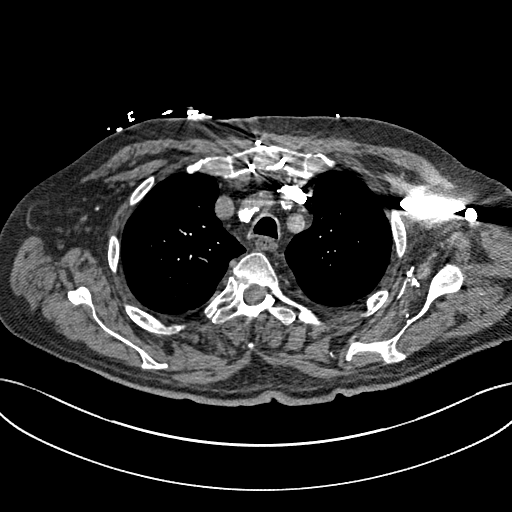
[im 271/305  lung]
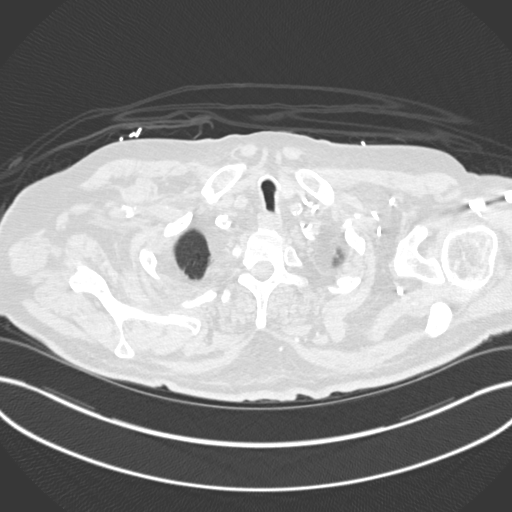
[im 288/305  soft-tissue]
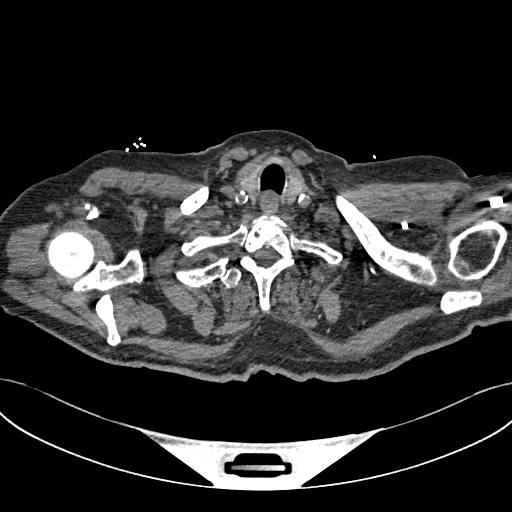

[Series 7: coronal mpr · coronal · 0.61mm/px · 3 of 139 slices shown]
[im 35/139  soft-tissue]
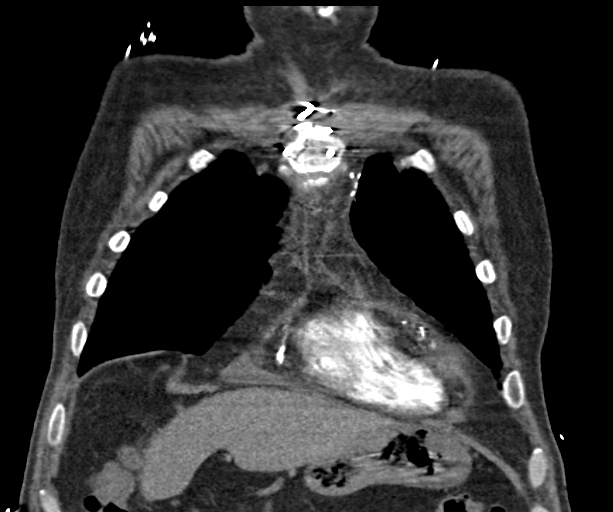
[im 70/139  soft-tissue]
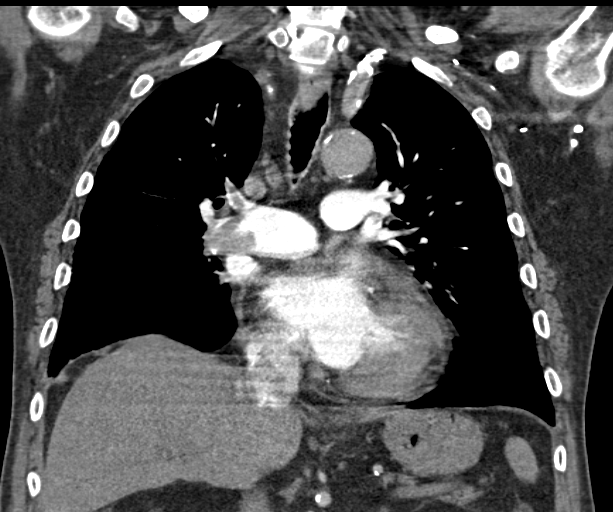
[im 104/139  soft-tissue]
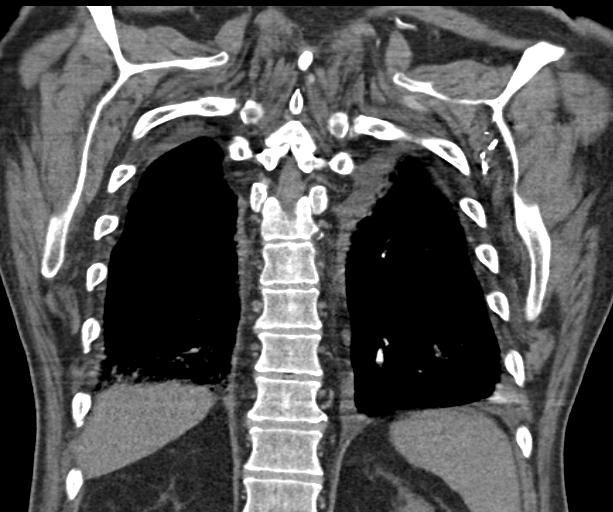

[17 of 46 positions shown; findings below may reference images not displayed]

FINDINGS: Study is positive for pulmonary embolism. There is clot in the
distal right pulmonary artery with a large amount of clot extending
into the right lower lobar arteries. Small amount of clot in the
proximal right middle lobe pulmonary artery. Small amount of clot
extending into the proximal right upper lobe pulmonary arteries.
Normal caliber of the main pulmonary artery measuring 2.8 cm. There
may be a small amount of clot involving a left lower lobe
subsegmental branch. No significant enlargement of the right
ventricle and no evidence for interventricular septal deviation.

Postsurgical changes compatible with a CABG procedure. There are
trace bilateral pleural effusions, left side greater than right.
There is small amount of pericardial fluid.

Images of the upper abdomen again demonstrate a small lymph node
with calcifications in the porta hepatis. No acute abnormality in
the upper abdomen. There is no significant chest lymphadenopathy.
Atherosclerotic calcifications involving the thoracic aorta and
great vessels. The right brachiocephalic artery is heavily
calcified.

The trachea and mainstem bronchi are patent. Mild emphysematous
changes in the upper lobes. There are patchy peripheral densities in
the right lower lobe which are concerning for an infarct.
Alternatively, this could be related to atelectasis. There is no
significant airspace disease or consolidation in the left lung. Few
peripheral densities in the left upper lobe are nonspecific.

No acute bone abnormality.  Old right rib fractures.

Review of the MIP images confirms the above findings.
IMPRESSION: Positive for pulmonary emboli. The clot burden is predominantly in
the right pulmonary arteries, largest in the right lower lobe.
Peripheral densities in the right lower lobe are concerning for an
infarct.

Trace bilateral pleural effusions.

Postsurgical changes in the chest.

Critical Value/emergent results were called by telephone at the time
of interpretation on 01/01/2015 at [DATE] to Dr. LAI MAY BELONIO ,
who verbally acknowledged these results.

## 2017-01-10 ENCOUNTER — Ambulatory Visit: Payer: Medicare Other | Admitting: Nurse Practitioner

## 2017-01-11 DIAGNOSIS — D649 Anemia, unspecified: Secondary | ICD-10-CM | POA: Diagnosis not present

## 2017-01-11 DIAGNOSIS — R195 Other fecal abnormalities: Secondary | ICD-10-CM | POA: Diagnosis not present

## 2017-01-12 DIAGNOSIS — M461 Sacroiliitis, not elsewhere classified: Secondary | ICD-10-CM | POA: Diagnosis not present

## 2017-01-25 ENCOUNTER — Ambulatory Visit (INDEPENDENT_AMBULATORY_CARE_PROVIDER_SITE_OTHER): Payer: Medicare Other | Admitting: Nurse Practitioner

## 2017-01-25 ENCOUNTER — Encounter: Payer: Self-pay | Admitting: Nurse Practitioner

## 2017-01-25 VITALS — BP 200/100 | HR 70 | Ht 72.0 in | Wt 216.8 lb

## 2017-01-25 DIAGNOSIS — R609 Edema, unspecified: Secondary | ICD-10-CM

## 2017-01-25 DIAGNOSIS — I739 Peripheral vascular disease, unspecified: Secondary | ICD-10-CM | POA: Diagnosis not present

## 2017-01-25 DIAGNOSIS — I5022 Chronic systolic (congestive) heart failure: Secondary | ICD-10-CM | POA: Diagnosis not present

## 2017-01-25 DIAGNOSIS — E7800 Pure hypercholesterolemia, unspecified: Secondary | ICD-10-CM | POA: Diagnosis not present

## 2017-01-25 DIAGNOSIS — I25810 Atherosclerosis of coronary artery bypass graft(s) without angina pectoris: Secondary | ICD-10-CM | POA: Diagnosis not present

## 2017-01-25 DIAGNOSIS — I714 Abdominal aortic aneurysm, without rupture, unspecified: Secondary | ICD-10-CM

## 2017-01-25 DIAGNOSIS — I10 Essential (primary) hypertension: Secondary | ICD-10-CM

## 2017-01-25 LAB — BASIC METABOLIC PANEL
BUN/Creatinine Ratio: 20 (ref 10–24)
BUN: 23 mg/dL (ref 8–27)
CO2: 23 mmol/L (ref 20–29)
Calcium: 9.6 mg/dL (ref 8.6–10.2)
Chloride: 94 mmol/L — ABNORMAL LOW (ref 96–106)
Creatinine, Ser: 1.16 mg/dL (ref 0.76–1.27)
GFR calc Af Amer: 74 mL/min/{1.73_m2} (ref >59)
GFR calc non Af Amer: 64 mL/min/{1.73_m2} (ref >59)
Glucose: 99 mg/dL (ref 65–99)
Potassium: 4.4 mmol/L (ref 3.5–5.2)
Sodium: 134 mmol/L (ref 134–144)

## 2017-01-25 LAB — LIPID PANEL
Chol/HDL Ratio: 1.9 ratio (ref 0.0–5.0)
Cholesterol, Total: 111 mg/dL (ref 100–199)
HDL: 58 mg/dL (ref >39)
LDL Calculated: 43 mg/dL (ref 0–99)
Triglycerides: 50 mg/dL (ref 0–149)
VLDL Cholesterol Cal: 10 mg/dL (ref 5–40)

## 2017-01-25 LAB — CBC
Hematocrit: 29.8 % — ABNORMAL LOW (ref 37.5–51.0)
Hemoglobin: 10.3 g/dL — ABNORMAL LOW (ref 13.0–17.7)
MCH: 33.2 pg — ABNORMAL HIGH (ref 26.6–33.0)
MCHC: 34.6 g/dL (ref 31.5–35.7)
MCV: 96 fL (ref 79–97)
Platelets: 187 10*3/uL (ref 150–379)
RBC: 3.1 x10E6/uL — ABNORMAL LOW (ref 4.14–5.80)
RDW: 13.9 % (ref 12.3–15.4)
WBC: 7.4 10*3/uL (ref 3.4–10.8)

## 2017-01-25 LAB — HEPATIC FUNCTION PANEL
ALT: 8 IU/L (ref 0–44)
AST: 12 IU/L (ref 0–40)
Albumin: 4 g/dL (ref 3.6–4.8)
Alkaline Phosphatase: 62 IU/L (ref 39–117)
Bilirubin Total: 1 mg/dL (ref 0.0–1.2)
Bilirubin, Direct: 0.29 mg/dL (ref 0.00–0.40)
Total Protein: 6.5 g/dL (ref 6.0–8.5)

## 2017-01-25 NOTE — Progress Notes (Signed)
CARDIOLOGY OFFICE NOTE  Date:  01/25/2017    [name] Date of Birth: 04-28-1951 Medical Record #[id number]  PCP:  Alroy Dust, L.Marlou Sa, MD  Cardiologist:  Jerel Shepherd  Chief Complaint  Patient presents with  . Coronary Artery Disease    4 month check - seen for Dr. Burt Knack    History of Present Illness: [name] is a 66 y.o. male who presents today for a 4 month check. Seen for Dr. Burt Knack. Former patient of Dr. Susa Simmonds.   He has an extensive history of PVD and CAD. He was previously followed by Dr. Bridgett Larsson at [facility] but now seeing Vascular in [facility] with Dr. Sammuel Hines. Prior cath in 2006 showing normal LV function with a totally occluded RCA with left to right collaterals. He has distal small vessel disease in the RCA with diffuse mild to moderate stenosis in the LAD. Former smoker. His PVD history is quite extensive and includes AAA (with no plans to intervene due to the nature of his aneurysm and his multiple comorbidities - last noted scan was 5.8 x 5.4 from 02/2015).   Other problems include HTN, HLD and OA. He has a history of hyponatremia as well.  He presented back in July of 2016 with chest pain/NSTEMI and underwent CABG x 5 by Dr. Cyndia Bent. He had vein harvesting from the right lower extremity. He then presented with progressive right lower extremity swelling and discomfort. He followed up with the cardiac surgery office on 8/2 and was started on Lasix and potassium for bilateral lower extremity edema. He fell at home and he was on the floor for at least 2 hours before his wife found him and she brought him back to the ER. Subsequently found to have acute RLE DVT and PE. Was on IV heparin, transitioned to Xarelto due to drop in platelets, HIT panel + and serotonin release assay + as well and he was changed to Arixtra and seen by Dr. Alen Blew. Warfarin started and he was on 6 months of therapy. He did require transfusion of 2 units PRBCs.   At his follow up visit  in October of 2016 - he was much better - really back to his baseline. Had been back to [facility] - aneurysm had grown - but still no plan to intervene. Due to occluded common and external iliac artery - he would need an open aneurysm repair. Was told to "go live his life". Anxious to get back to some traveling within the [place]. He had been released from hematology - was to stop coumadin after total 6 months of therapy. Coumadin was stopped on March 1st and he was transitioned back to Plavix afterwards.   Seen several times lastspring - weight was up. He had been on steroids for allergic reaction and eating "everything". BP sky high. I increased his medicines. Changed back to Coreg from Metoprolol. He was back drinking alcohol. ? How much salt he uses. Last seen back in [place] he was doing ok other than back pain. His BP cuff matches up pretty well. Probably not planning on going back to [facility] - always told "just go do your bucket list".  He has had several back injections since his last visit here. He has had imaging done by orthopedics - the aneurysm wasnoted - he was told to come back here. [name] and his wife had reiterated several times in the past that they do NOTwish to know the actualmeasurements.   I received a copy  of the lumbar spine film - noted a 6.1 x 6.5 cm infrarenal aortic aneurysm. Vascular surgery referral was recommended.   He is followed by Dr. Sammuel Hines at [facility] - last measurement there was 5.8 x 5.4 - this was from October of 2016.   I saw him several times in January - due to swelling - got him back on Lasix. Had cellulitis. Got him an appointment to go back to [facility]. Chronic pain issues. Cardiac status seemed improved.  Last seen by me back in February - his AAA is getting larger - was considering options. Still limited by back pain. On chronic Tramadol. Last seen back in May - they had again decided to "not do anything". Has no more pain with the umbilical hernia. Telling me that the  mesh (if it were put in - makes it difficult to fix the aneurysm). May try to fix it if the aneurysm was to be fixed. At this time - NO PLAN TO FIX THE ANEURYSM. Lots of alcohol use. They were both ok with how he was doing.   Comes in today. Lots of issues. Here with his wife today - they are quite the pair today - she has had rotator cuff surgery and is in a sling. She is Abbas's primary caregiver. They have family helping them out.  He had a fall last Tuesday - slipped on a rug - she is worried about a rib fracture - taking some old pain medicine. He is still having some pain all over - does hurt to take a deep breath sometimes but not short of breath - but then she had her surgery and so they could not seek medical attention. Found previously to have some anemia - got a stool test - found to have blood in his stool. Saw GI - on PPI therapy, iron and Vit C. Says his blood count has improved. He is to have a repeat stool test later this month. Apparently still drinking - not quantified or really discussed today. Has had more swelling just over the past few days - getting way too much salt - she can't cook and I suspect this is why. He was off his lasix altogether but she restarted at 20 mg for the last 2 days. No medicines taken yet today other than Tramadol today. Tells me he checked his BP last week and it was in the 140's. They both say this has been a "hell week".   Past Medical History:  Diagnosis Date  . AAA (abdominal aortic aneurysm) ([place])   . Anxiety   . CHF (congestive heart failure) ([place])   . COPD (chronic obstructive pulmonary disease) ([place])   . Coronary artery disease   . GERD (gastroesophageal reflux disease)   . History of blood transfusion   . Hyperlipidemia   . Hypertension    takes meds daily  . Hyponatremia   . Lumbar degenerative disc disease   . Myocardial infarction ([place])   . PAD (peripheral artery disease) ([place])   . PONV (postoperative nausea and vomiting)   . Raynaud's  disease /phenomenon   . Stroke [facility]) 10/15/2005    Past Surgical History:  Procedure Laterality Date  . ABDOMINAL ANGIOGRAM  03/30/2012  . ABDOMINAL AORTAGRAM N/A 03/30/2012   Procedure: ABDOMINAL Maxcine Ham;  Surgeon: Conrad Ewa Gentry, MD;  Location: [facility] CATH LAB;  Service: Cardiovascular;  Laterality: N/A;  . CARDIAC CATHETERIZATION  2006  . CARDIAC CATHETERIZATION N/A 12/09/2014   Procedure: Left Heart Cath and Coronary  Angiography;  Surgeon: [name], MD;  Location: [place] CV LAB;  Service: Cardiovascular;  Laterality: N/A;  . CAROTID ENDARTERECTOMY     bilateral  . CAROTID ENDARTERECTOMY  04/13/2005   Right and 06/03/2005 Left  . CORONARY ARTERY BYPASS GRAFT N/A 12/13/2014   Procedure: CORONARY ARTERY BYPASS GRAFT times five using left internal mammary artery and endoscopic right leg saphenous vein harvest;  Surgeon: Gaye Pollack, MD;  Location: [place] OR;  Service: Open Heart Surgery;  Laterality: N/A;  . HERNIA REPAIR    . ILIAC ARTERY STENT  10/15/2005    Left common and external   . INTRAOPERATIVE TRANSESOPHAGEAL ECHOCARDIOGRAM N/A 12/13/2014   Procedure: INTRAOPERATIVE TRANSESOPHAGEAL ECHOCARDIOGRAM;  Surgeon: Gaye Pollack, MD;  Location: [facility] OR;  Service: Open Heart Surgery;  Laterality: N/A;     Medications: Current Meds  Medication Sig  . acetaminophen (TYLENOL) 325 MG tablet Take 650 mg by mouth every 6 (six) hours as needed for mild pain or headache. Reported on 07/22/2015  . Ascorbic Acid (VITAMIN C) 100 MG tablet Take 100 mg by mouth daily.  Marland Kitchen aspirin 81 MG tablet Take 81 mg by mouth every evening.   . carvedilol (COREG) 25 MG tablet Take 1 tablet (25 mg total) by mouth 2 (two) times daily.  . clobetasol ointment (TEMOVATE) 0.05 % apply to affected area twice a day if needed  . cloNIDine (CATAPRES) 0.1 MG tablet Take 1 tablet (0.1 mg total) by mouth at bedtime.  . clopidogrel (PLAVIX) 75 MG tablet take 1 tablet by mouth once daily  . ferrous sulfate 325 (65 FE) MG EC  tablet Take 325 mg by mouth daily with breakfast.  . folic acid (FOLVITE) 1 MG tablet Take 1 tablet (1 mg total) by mouth daily.  . furosemide (LASIX) 40 MG tablet Take 0.5  Tablet (20 mg) by mouth daily. May take and additional 0.5 tablet (20 mg) if needed for swelling.  . levocetirizine (XYZAL) 5 MG tablet TAKE ONE TABLET ONCE DAILY AS DIRECTED  . lisinopril (PRINIVIL,ZESTRIL) 5 MG tablet take 1 tablet by mouth once daily  . NITROSTAT 0.4 MG SL tablet Place 1 tablet (0.4 mg total) under the tongue every 5 (five) minutes as needed. Chest pain  . pantoprazole (PROTONIX) 40 MG tablet Take 40 mg by mouth daily.  . ranitidine (ZANTAC) 300 MG tablet take 1 tablet by mouth at bedtime  . rosuvastatin (CRESTOR) 20 MG tablet Take 1 tablet (20 mg total) by mouth at bedtime.  . Thiamine HCl (VITAMIN B-1) 100 MG tablet Take 100 mg by mouth daily.    . traMADol (ULTRAM) 50 MG tablet Take 1 tablet (50 mg total) by mouth every 6 (six) hours as needed.  . triamcinolone ointment (KENALOG) 0.1 % Apply 1 application topically 2 (two) times daily.     Allergies: Allergies  Allergen Reactions  . Hctz [Hydrochlorothiazide] Other (See Comments)    Black out due to electrolytes  . Norvasc [Amlodipine Besylate] Swelling       . Pletal [Cilostazol] Diarrhea  . Heparin Other (See Comments)    HIT positive as of 12/31/14    Social History: The patient  reports that he quit smoking about 11 years ago. His smoking use included Cigarettes. He has never used smokeless tobacco. He reports that he drinks about 14.4 oz of alcohol per week . He reports that he does not use drugs.   Family History: The patient's family history includes Coronary artery disease (age of onset:  35) in his father; Dementia in his mother; Heart disease in his brother and father; Hyperlipidemia in his brother and father; Hypertension in his brother and father; Other (age of onset: 34) in his mother; Peripheral vascular disease in his father.    Review of Systems: Please see the history of present illness.   Otherwise, the review of systems is positive for none.   All other systems are reviewed and negative.   Physical Exam: VS:  BP (!) 200/100 (BP Location: Left Arm, Patient Position: Sitting, Cuff Size: Normal)   Pulse 70   Ht 6' (1.829 m)   Wt 216 lb 12.8 oz (98.3 kg)   BMI 29.40 kg/m  .  BMI Body mass index is 29.4 kg/m.  Wt Readings from Last 3 Encounters:  01/25/17 216 lb 12.8 oz (98.3 kg)  09/27/16 214 lb (97.1 kg)  06/28/16 212 lb 6.4 oz (96.3 kg)   Repeat BP by me is 200/110.   General: He looks more unkept today. Staggering and unsteady gait.  HEENT: Normal.  Neck: Supple, no JVD, carotid bruits, or masses noted.  Cardiac: Regular rate and rhythm. No murmurs, rubs, or gallops. Over 2+ edema.  Respiratory:  Lungs are clear to auscultation bilaterally with normal work of breathing.  GI: Soft and nontender. Large protruding umbilical hernia present.  MS: No deformity or atrophy. Gait and ROM intact.  Skin: Warm and dry. Color is normal. Lots of bruising over his arms.  Neuro:  Strength and sensation are intact and no gross focal deficits noted.  Psych: Alert, appropriate and with normal affect.   LABORATORY DATA:  EKG:  EKG is ordered today. This shows NSR with nonspecific changes - unchanged.   Lab Results  Component Value Date   WBC 8.4 05/31/2016   HGB 11.2 (L) 05/31/2016   HCT 31.5 (L) 05/31/2016   PLT 187 05/31/2016   GLUCOSE 93 10/11/2016   CHOL 135 06/11/2015   TRIG 52 06/11/2015   HDL 69 06/11/2015   LDLCALC 56 06/11/2015   ALT 9 06/11/2015   AST 17 06/11/2015   NA 122 (L) 10/11/2016   K 4.7 10/11/2016   CL 90 (L) 10/11/2016   CREATININE 1.76 (H) 10/11/2016   BUN 31 (H) 10/11/2016   CO2 18 10/11/2016   TSH 0.60 08/05/2015   INR 1.8 07/14/2015   HGBA1C 5.4 12/12/2014     BNP (last 3 results)  Recent Labs  05/31/16 0950  BNP 605.7*    ProBNP (last 3 results) No results for  input(s): PROBNP in the last 8760 hours.   Other Studies Reviewed Today:  Limted CT 06/23/2016 Impressions    --Limited study in the absence of IV contrast.    --Mild interval enlargement of the infrarenal abdominal aneurysm measuring6.2 x 5.8 cm (previously 5.8 x 5.4 cm) .     --Stable left common iliac aneurysm.    --Moderate to severe atherosclerotic changes, as above, with patency and degree of stenosis unclear due to lack of contrast.    --Umbilical hernia including loop of bowel with no clear overlying skin, likely an ostomy however unclear by CT.        TEE Study Conclusions from 11/2014  - Left ventricle: Systolic function was mildly reduced. The  estimated ejection fraction was in the range of 45% to 50%.  Hypokinesis of the inferior myocardium. - Aortic valve: No evidence of vegetation. There was trivial  regurgitation. - Mitral valve: Systolic bowing without prolapse. There was mild  regurgitation. -  Left atrium: No evidence of thrombus in the atrial cavity or  appendage. No evidence of thrombus in the appendage. - Atrial septum: No defect or patent foramen ovale was identified. - Tricuspid valve: No evidence of vegetation.   Assessment/Plan:  1. Lower extremity edema/cellulitis/chronic systolic HF - weight is back up - he has more swelling on exam - I suspect this is a direct correlation to getting too much salt since she is not able to cook or monitor his intake. Restarting lasix today. Lab today.   2. Essential hypertension, benign - BP quite high today - he has not had any of his medicines. Restarting Lasix today - need to monitor.   3. Prior RLE DVT and PE - treated with 6 months of Coumadin - this was stopped March 1st, 2017. Did have + HIT panel. No longer seeing Dr. Alen Blew.   4. Thrombocytopenia  - Heparin induced clinically and HIT Ab panel positive - Serotonin release assay positive as well and hence HIT confirmed -  Heparin is listed as an allergy for him  5. CAD/recent NSTEMI/S/P CABG x 5 (12/13/14) - Continue beta blocker, crestor. Back on Plavix. No symptoms of angina.   6.  PAD  -followed by Dr. Sammuel Hines   7. AAA  -Very complex and due to degree of calcification has been deemed not electively operable - has gotten back to see Dr. Sammuel Hines. No plans for surgical intervention at this time.   8. Recent fall - no obvious injury. Encouraged to use a pillow as a splint - keep taking deep breaths, etc.   9. Umbilical hernia - no plans to intervene on.  10 Anemia - + blood in stools - to have repeat test by GI later this month - recheck his labs today - may need to stop Plavix if recurs.   Overall, very tenuous situation at hand.    Current medicines are reviewed with the patient today.  The patient does not have concerns regarding medicines other than what has been noted above.  The following changes have been made:  See above.  Labs/ tests ordered today include:    Orders Placed This Encounter  Procedures  . Basic metabolic panel  . CBC  . Hepatic function panel  . Lipid panel  . EKG 12-Lead     Disposition:   FU with me in 2 months.   Patient is agreeable to this plan and will call if any problems develop in the interim.   SignedTruitt Merle, NP  01/25/2017 9:11 AM  [facility] [address] [place], [place]  [address] Phone: [phone] Fax: [phone]

## 2017-01-25 NOTE — Patient Instructions (Addendum)
We will be checking the following labs today - BMET, CBC, HPF and lipids - we will send a copy of the results to Dr. Alroy Dust   Medication Instructions:    Continue with your current medicines. BUT  Take a whole pill of Lasix every day for a week and then let us know how the swelling is - we will then decide about whether we need to continue.     Testing/Procedures To Be Arranged:  N/A  Follow-Up:   See me in about 2 months.     Other Special Instructions:   I want you to check your BP today - over the next several days and let me know how it is going.     If you need a refill on your cardiac medications before your next appointment, please call your pharmacy.   Call the [place] office at [phone] if you have any questions, problems or concerns.

## 2017-01-27 ENCOUNTER — Telehealth: Payer: Self-pay | Admitting: Nurse Practitioner

## 2017-01-27 NOTE — Telephone Encounter (Signed)
New Message   Pt leaving bp for yesterday and today for [name]   BP today is 133/73, yesterday 137/68

## 2017-01-28 NOTE — Telephone Encounter (Signed)
Much better - continue to monitor. No change in medicines.

## 2017-02-01 DIAGNOSIS — D692 Other nonthrombocytopenic purpura: Secondary | ICD-10-CM | POA: Diagnosis not present

## 2017-02-01 DIAGNOSIS — D1801 Hemangioma of skin and subcutaneous tissue: Secondary | ICD-10-CM | POA: Diagnosis not present

## 2017-02-01 DIAGNOSIS — I8311 Varicose veins of right lower extremity with inflammation: Secondary | ICD-10-CM | POA: Diagnosis not present

## 2017-02-01 DIAGNOSIS — L720 Epidermal cyst: Secondary | ICD-10-CM | POA: Diagnosis not present

## 2017-02-01 DIAGNOSIS — L821 Other seborrheic keratosis: Secondary | ICD-10-CM | POA: Diagnosis not present

## 2017-02-01 DIAGNOSIS — D2272 Melanocytic nevi of left lower limb, including hip: Secondary | ICD-10-CM | POA: Diagnosis not present

## 2017-02-01 DIAGNOSIS — Z85820 Personal history of malignant melanoma of skin: Secondary | ICD-10-CM | POA: Diagnosis not present

## 2017-02-01 DIAGNOSIS — D225 Melanocytic nevi of trunk: Secondary | ICD-10-CM | POA: Diagnosis not present

## 2017-02-01 DIAGNOSIS — I872 Venous insufficiency (chronic) (peripheral): Secondary | ICD-10-CM | POA: Diagnosis not present

## 2017-02-01 DIAGNOSIS — I8312 Varicose veins of left lower extremity with inflammation: Secondary | ICD-10-CM | POA: Diagnosis not present

## 2017-02-02 NOTE — Telephone Encounter (Signed)
Pt's wife per [facility]) is aware of Lori's recommendation's.

## 2017-02-25 ENCOUNTER — Inpatient Hospital Stay (HOSPITAL_COMMUNITY)
Admission: EM | Admit: 2017-02-25 | Discharge: 2017-03-01 | DRG: 151 | Disposition: A | Discharge status: Home Health | Payer: Medicare Other | Attending: Family Medicine | Admitting: Family Medicine

## 2017-02-25 ENCOUNTER — Encounter (HOSPITAL_COMMUNITY): Payer: Self-pay | Admitting: Emergency Medicine

## 2017-02-25 DIAGNOSIS — Z951 Presence of aortocoronary bypass graft: Secondary | ICD-10-CM | POA: Diagnosis not present

## 2017-02-25 DIAGNOSIS — J449 Chronic obstructive pulmonary disease, unspecified: Secondary | ICD-10-CM | POA: Diagnosis present

## 2017-02-25 DIAGNOSIS — Z7902 Long term (current) use of antithrombotics/antiplatelets: Secondary | ICD-10-CM

## 2017-02-25 DIAGNOSIS — D62 Acute posthemorrhagic anemia: Secondary | ICD-10-CM | POA: Diagnosis not present

## 2017-02-25 DIAGNOSIS — Z87891 Personal history of nicotine dependence: Secondary | ICD-10-CM

## 2017-02-25 DIAGNOSIS — D649 Anemia, unspecified: Secondary | ICD-10-CM | POA: Diagnosis present

## 2017-02-25 DIAGNOSIS — R040 Epistaxis: Principal | ICD-10-CM | POA: Diagnosis present

## 2017-02-25 DIAGNOSIS — Z7289 Other problems related to lifestyle: Secondary | ICD-10-CM

## 2017-02-25 DIAGNOSIS — I714 Abdominal aortic aneurysm, without rupture: Secondary | ICD-10-CM | POA: Diagnosis present

## 2017-02-25 DIAGNOSIS — Z888 Allergy status to other drugs, medicaments and biological substances status: Secondary | ICD-10-CM

## 2017-02-25 DIAGNOSIS — Z86711 Personal history of pulmonary embolism: Secondary | ICD-10-CM

## 2017-02-25 DIAGNOSIS — Z79899 Other long term (current) drug therapy: Secondary | ICD-10-CM

## 2017-02-25 DIAGNOSIS — I110 Hypertensive heart disease with heart failure: Secondary | ICD-10-CM | POA: Diagnosis present

## 2017-02-25 DIAGNOSIS — E871 Hypo-osmolality and hyponatremia: Secondary | ICD-10-CM | POA: Diagnosis present

## 2017-02-25 DIAGNOSIS — R404 Transient alteration of awareness: Secondary | ICD-10-CM | POA: Diagnosis not present

## 2017-02-25 DIAGNOSIS — I5032 Chronic diastolic (congestive) heart failure: Secondary | ICD-10-CM | POA: Diagnosis not present

## 2017-02-25 DIAGNOSIS — R42 Dizziness and giddiness: Secondary | ICD-10-CM | POA: Diagnosis not present

## 2017-02-25 DIAGNOSIS — Z8673 Personal history of transient ischemic attack (TIA), and cerebral infarction without residual deficits: Secondary | ICD-10-CM

## 2017-02-25 DIAGNOSIS — Z8249 Family history of ischemic heart disease and other diseases of the circulatory system: Secondary | ICD-10-CM

## 2017-02-25 DIAGNOSIS — I2510 Atherosclerotic heart disease of native coronary artery without angina pectoris: Secondary | ICD-10-CM | POA: Diagnosis present

## 2017-02-25 DIAGNOSIS — Z7982 Long term (current) use of aspirin: Secondary | ICD-10-CM

## 2017-02-25 DIAGNOSIS — Z86718 Personal history of other venous thrombosis and embolism: Secondary | ICD-10-CM

## 2017-02-25 DIAGNOSIS — I10 Essential (primary) hypertension: Secondary | ICD-10-CM

## 2017-02-25 DIAGNOSIS — I160 Hypertensive urgency: Secondary | ICD-10-CM | POA: Diagnosis present

## 2017-02-25 DIAGNOSIS — E785 Hyperlipidemia, unspecified: Secondary | ICD-10-CM | POA: Diagnosis present

## 2017-02-25 DIAGNOSIS — Z8349 Family history of other endocrine, nutritional and metabolic diseases: Secondary | ICD-10-CM

## 2017-02-25 DIAGNOSIS — I252 Old myocardial infarction: Secondary | ICD-10-CM

## 2017-02-25 LAB — URINALYSIS, ROUTINE W REFLEX MICROSCOPIC
BILIRUBIN URINE: NEGATIVE
Bacteria, UA: NONE SEEN
Glucose, UA: NEGATIVE mg/dL
HGB URINE DIPSTICK: NEGATIVE
Ketones, ur: NEGATIVE mg/dL
Leukocytes, UA: NEGATIVE
Nitrite: NEGATIVE
Protein, ur: 30 mg/dL — AB
SPECIFIC GRAVITY, URINE: 1.006 (ref 1.005–1.030)
pH: 5 (ref 5.0–8.0)

## 2017-02-25 LAB — BASIC METABOLIC PANEL
Anion gap: 11 (ref 5–15)
BUN: 21 mg/dL — AB (ref 6–20)
CALCIUM: 9.4 mg/dL (ref 8.9–10.3)
CHLORIDE: 93 mmol/L — AB (ref 101–111)
CO2: 25 mmol/L (ref 22–32)
CREATININE: 1.21 mg/dL (ref 0.61–1.24)
GFR calc Af Amer: >60 mL/min (ref >60)
GFR calc non Af Amer: 60 mL/min — ABNORMAL LOW (ref >60)
GLUCOSE: 124 mg/dL — AB (ref 65–99)
Potassium: 4.3 mmol/L (ref 3.5–5.1)
Sodium: 129 mmol/L — ABNORMAL LOW (ref 135–145)

## 2017-02-25 LAB — CBC
HCT: 31 % — ABNORMAL LOW (ref 39.0–52.0)
HEMATOCRIT: 27.4 % — AB (ref 39.0–52.0)
Hemoglobin: 11 g/dL — ABNORMAL LOW (ref 13.0–17.0)
Hemoglobin: 9.4 g/dL — ABNORMAL LOW (ref 13.0–17.0)
MCH: 34.2 pg — ABNORMAL HIGH (ref 26.0–34.0)
MCH: 33.1 pg (ref 26.0–34.0)
MCHC: 35.5 g/dL (ref 30.0–36.0)
MCHC: 34.3 g/dL (ref 30.0–36.0)
MCV: 96.3 fL (ref 78.0–100.0)
MCV: 96.5 fL (ref 78.0–100.0)
PLATELETS: 184 10*3/uL (ref 150–400)
PLATELETS: 198 10*3/uL (ref 150–400)
RBC: 3.22 MIL/uL — ABNORMAL LOW (ref 4.22–5.81)
RBC: 2.84 MIL/uL — ABNORMAL LOW (ref 4.22–5.81)
RDW: 14.2 % (ref 11.5–15.5)
RDW: 14.1 % (ref 11.5–15.5)
WBC: 9.2 10*3/uL (ref 4.0–10.5)
WBC: 10.4 10*3/uL (ref 4.0–10.5)

## 2017-02-25 LAB — HEMOGLOBIN AND HEMATOCRIT, BLOOD
HCT: 26.8 % — ABNORMAL LOW (ref 39.0–52.0)
Hemoglobin: 9.5 g/dL — ABNORMAL LOW (ref 13.0–17.0)

## 2017-02-25 LAB — PROTIME-INR
INR: 0.98
Prothrombin Time: 12.9 seconds (ref 11.4–15.2)

## 2017-02-25 LAB — CBG MONITORING, ED: GLUCOSE-CAPILLARY: 126 mg/dL — AB (ref 65–99)

## 2017-02-25 MED ORDER — LABETALOL HCL 5 MG/ML IV SOLN
10.0000 mg | Freq: Once | INTRAVENOUS | Status: AC
  Administered 2017-02-25: 10 mg via INTRAVENOUS
  Filled 2017-02-25: qty 4

## 2017-02-25 MED ORDER — OXYMETAZOLINE HCL 0.05 % NA SOLN: 1.0000 | Freq: Once | NASAL | Status: DC

## 2017-02-25 MED ORDER — OXYMETAZOLINE HCL 0.05 % NA SOLN
NASAL | Status: AC
  Filled 2017-02-25: qty 15

## 2017-02-25 MED ORDER — LIDOCAINE HCL (PF) 2 % IJ SOLN
20.0000 mL | Freq: Once | INTRAMUSCULAR | Status: AC
  Administered 2017-02-25: 20 mL
  Filled 2017-02-25: qty 20

## 2017-02-25 MED ORDER — SILVER NITRATE-POT NITRATE 75-25 % EX MISC
1.0000 | Freq: Once | CUTANEOUS | Status: AC
  Administered 2017-02-25: 1 via TOPICAL
  Filled 2017-02-25: qty 1

## 2017-02-25 MED ORDER — NITROGLYCERIN IN D5W 200-5 MCG/ML-% IV SOLN
0.0000 ug/min | Freq: Once | INTRAVENOUS | Status: AC
  Administered 2017-02-25: 5 ug/min via INTRAVENOUS
  Filled 2017-02-25: qty 250

## 2017-02-25 MED ORDER — SODIUM CHLORIDE 0.9 % IV BOLUS (SEPSIS)
1000.0000 mL | Freq: Once | INTRAVENOUS | Status: AC
  Administered 2017-02-25: 1000 mL via INTRAVENOUS

## 2017-02-25 MED ORDER — CLONIDINE HCL 0.1 MG PO TABS
0.1000 mg | ORAL_TABLET | Freq: Once | ORAL | Status: AC
  Administered 2017-02-25: 0.1 mg via ORAL
  Filled 2017-02-25: qty 1

## 2017-02-25 MED ORDER — SODIUM CHLORIDE 0.9 % IV SOLN
1.5000 g | Freq: Once | INTRAVENOUS | Status: AC
  Administered 2017-02-26: 1.5 g via INTRAVENOUS
  Filled 2017-02-25: qty 1.5

## 2017-02-25 NOTE — ED Notes (Signed)
ENT charge sheet filled out for opening of ENT cart

## 2017-02-25 NOTE — ED Notes (Signed)
ED Provider at bedside. 

## 2017-02-25 NOTE — Consult Note (Signed)
[place]  Primary Care Physician: Alroy Dust, L.Marlou Sa, MD Patient Location at Initial Consult: Emergency Department Chief Complaint/Reason for Consult: epistaxis  History of Presenting Illness:  History obtained from patient. [name] is a  66 y.o. male presenting with left-sided epistaxis. This began acutely this morning. This has never happened before. He is on plavix and ASA for CABG done in 2016. BP has been elevated to 170s and very difficult to manage, requiring multiple IV medications. Nose has been packed 3 times today with cotton, then rhinorocket, then attempt at Pope pack which patient could not tolerate. Coags have been checked and are normal. Hgb has dropped from 11 to 9.  Patient has stents as well, has had DVT, has AAA, CHF, is DNR.  Here today with his wife, who provides portions of the history.   Past Medical History:  Diagnosis Date  . AAA (abdominal aortic aneurysm) ([place])   . Anxiety   . CHF (congestive heart failure) ([place])   . COPD (chronic obstructive pulmonary disease) ([place])   . Coronary artery disease   . GERD (gastroesophageal reflux disease)   . History of blood transfusion   . Hyperlipidemia   . Hypertension    takes meds daily  . Hyponatremia   . Lumbar degenerative disc disease   . Myocardial infarction ([place])   . PAD (peripheral artery disease) ([place])   . PONV (postoperative nausea and vomiting)   . Raynaud's disease /phenomenon   . Stroke [facility]) 10/15/2005    Past Surgical History:  Procedure Laterality Date  . ABDOMINAL ANGIOGRAM  03/30/2012  . ABDOMINAL AORTAGRAM N/A 03/30/2012   Procedure: ABDOMINAL Maxcine Ham;  Surgeon: Conrad Heron Bay, MD;  Location: [facility] CATH LAB;  Service: Cardiovascular;  Laterality: N/A;  . CARDIAC CATHETERIZATION  2006  . CARDIAC CATHETERIZATION N/A 12/09/2014   Procedure: Left Heart Cath and Coronary Angiography;  Surgeon: [name], MD;  Location: [place] CV LAB;   Service: Cardiovascular;  Laterality: N/A;  . CAROTID ENDARTERECTOMY     bilateral  . CAROTID ENDARTERECTOMY  04/13/2005   Right and 06/03/2005 Left  . CORONARY ARTERY BYPASS GRAFT N/A 12/13/2014   Procedure: CORONARY ARTERY BYPASS GRAFT times five using left internal mammary artery and endoscopic right leg saphenous vein harvest;  Surgeon: Gaye Pollack, MD;  Location: [place] OR;  Service: Open Heart Surgery;  Laterality: N/A;  . HERNIA REPAIR    . ILIAC ARTERY STENT  10/15/2005    Left common and external   . INTRAOPERATIVE TRANSESOPHAGEAL ECHOCARDIOGRAM N/A 12/13/2014   Procedure: INTRAOPERATIVE TRANSESOPHAGEAL ECHOCARDIOGRAM;  Surgeon: Gaye Pollack, MD;  Location: [facility] OR;  Service: Open Heart Surgery;  Laterality: N/A;    Family History  Problem Relation Age of Onset  . Coronary artery disease Father 82       HTN, Hyperlipidemia died  . Heart disease Father        Heart Disease before age 42  . Hypertension Father   . Hyperlipidemia Father   . Peripheral vascular disease Father   . Other Mother 13        died rheumatoid arthritis  . Dementia Mother   . Heart disease Brother   . Hypertension Brother   . Hyperlipidemia Brother     Social History   Social History  . Marital status: Married    Spouse name: N/A  . Number of children: N/A  . Years of education: N/A   Occupational History  . MANAGER. owns  theaters [employer]   Social History Main Topics  . Smoking status: Former Smoker    Types: Cigarettes    Quit date: 05/24/2005  . Smokeless tobacco: Never Used  . Alcohol use 14.4 oz/week    24 Cans of beer per week     Comment: approximately 24 beer a week  . Drug use: No  . Sexual activity: Not Asked   Other Topics Concern  . None   Social History Narrative  . None    No current facility-administered medications on file prior to encounter.    Current Outpatient Prescriptions on File Prior to Encounter  Medication Sig Dispense Refill  . Ascorbic  Acid (VITAMIN C) 100 MG tablet Take 100 mg by mouth daily.    Marland Kitchen aspirin 81 MG tablet Take 81 mg by mouth every evening.     . carvedilol (COREG) 25 MG tablet Take 1 tablet (25 mg total) by mouth 2 (two) times daily. 180 tablet 3  . clobetasol ointment (TEMOVATE) 0.05 % apply to affected area twice a day if needed  0  . cloNIDine (CATAPRES) 0.1 MG tablet Take 1 tablet (0.1 mg total) by mouth at bedtime. 90 tablet 3  . clopidogrel (PLAVIX) 75 MG tablet take 1 tablet by mouth once daily 90 tablet 3  . ferrous sulfate 325 (65 FE) MG EC tablet Take 325 mg by mouth daily with breakfast.    . folic acid (FOLVITE) 1 MG tablet Take 1 tablet (1 mg total) by mouth daily. 30 tablet 0  . furosemide (LASIX) 40 MG tablet Take 0.5  Tablet (20 mg) by mouth daily. May take and additional 0.5 tablet (20 mg) if needed for swelling.    . levocetirizine (XYZAL) 5 MG tablet TAKE ONE TABLET ONCE DAILY AS DIRECTED 30 tablet 5  . lisinopril (PRINIVIL,ZESTRIL) 5 MG tablet take 1 tablet by mouth once daily 30 tablet 9  . NITROSTAT 0.4 MG SL tablet Place 1 tablet (0.4 mg total) under the tongue every 5 (five) minutes as needed. Chest pain 25 tablet 3  . pantoprazole (PROTONIX) 40 MG tablet Take 40 mg by mouth daily.  0  . ranitidine (ZANTAC) 300 MG tablet take 1 tablet by mouth at bedtime 30 tablet 11  . rosuvastatin (CRESTOR) 20 MG tablet Take 1 tablet (20 mg total) by mouth at bedtime. 90 tablet 2  . Thiamine HCl (VITAMIN B-1) 100 MG tablet Take 100 mg by mouth daily.      . traMADol (ULTRAM) 50 MG tablet Take 1 tablet (50 mg total) by mouth every 6 (six) hours as needed. 30 tablet 0  . triamcinolone ointment (KENALOG) 0.1 % Apply 1 application topically 2 (two) times daily. 80 g 1    Allergies  Allergen Reactions  . Hctz [Hydrochlorothiazide] Other (See Comments)    Black out due to electrolytes  . Norvasc [Amlodipine Besylate] Swelling       . Pletal [Cilostazol] Diarrhea  . Heparin Other (See Comments)    HIT  positive as of 12/31/14     Review of Systems: ROS positive for fatigue, facial pain, back pain, nose pain.    OBJECTIVE: Vital Signs: Vitals:   02/25/17 1915 02/25/17 1930  BP: (!) 152/86 (!) 156/94  Pulse: 83 82  Resp:  18  SpO2: 94% 95%    I&O No intake or output data in the 24 hours ending 02/25/17 2023  Physical Exam General: Well developed, well nourished. No acute distress. Voice normal  Head/Face:  Normocephalic, atraumatic. No scars or lesions. No sinus tenderness. Facial nerve intact and equal bilaterally.   No facial lacerations. Salivary glands non tender and without palpable masses  Eyes: Globes well positioned, no proptosis Lids: No periorbital edema/ecchymosis. No lid laceration Conjunctiva: No chemosis, hemorrhage Pupil: reactive  Ears: No gross deformity. Normal external canal.    Hearing: Weber midline. Air greater than bone bilaterally. Normal speech reception.  Nose: Septum anteriorly appears midline. There is active epistaxis from the left side. There are some prominent blood vessels in Kiesselbach's plexus.   Mouth/Oropharynx: Lips without any lesions. Dentition average. No mucosal lesions within the oropharynx. No tonsillar enlargement, exudate, or lesions. Pharyngeal walls symmetrical. Uvula midline. Tongue midline without lesions.  Neck: Trachea midline. No masses. No thyromegaly or nodules palpated. No crepitus.  Lymphatic: No lymphadenopathy in the neck.  Respiratory: No stridor or distress.  Cardiovascular: Regular rate and rhythm.  Extremities: No edema or cyanosis. Warm and well-perfused.  Skin: No scars or lesions on face or neck.  Neurologic: CN II-XII intact. Moving all extremities without gross abnormality.  Other:      Labs: Lab Results  Component Value Date   WBC 9.2 02/25/2017   HGB 9.5 (L) 02/25/2017   HCT 26.8 (L) 02/25/2017   PLT 184 02/25/2017   CHOL 111 01/25/2017   TRIG 50 01/25/2017   HDL 58 01/25/2017   ALT 8 01/25/2017    AST 12 01/25/2017   NA 129 (L) 02/25/2017   K 4.3 02/25/2017   CL 93 (L) 02/25/2017   CREATININE 1.21 02/25/2017   BUN 21 (H) 02/25/2017   CO2 25 02/25/2017   TSH 0.60 08/05/2015   INR 0.98 02/25/2017   HGBA1C 5.4 12/12/2014     Review of Ancillary Data / Diagnostic Tests: -Records from Vascular Surgery and Radiology reviewed Labs reviewed I discussed this case with Interventional Radiology, Dr. Jacqualyn Posey  ASSESSMENT:  66 y.o. male with severe persistent left-sided epistaxis s/p packing and cautery attempt. He has multiple complex vascular issues that are very complicated. I have recommended holding the ASA and plavix, though he is at increased risk for DVT, CVA. I discussed this with the patient's wife and family. We are going to continue to attempt more conservative measures with packing, Afrin, holding anticoagulants, ice packs, no nose blowing.   RECOMMENDATIONS: -Transfer to [facility] ED for further evaluation. Interventional Radiology has been consulted.  -Internal Medicine/Hospitalist Admission. Tight BP control is essential. -Recommend holding ASA and Plavix in the setting of an acute bleed -Will plan to maintain left nasal pack x 72 hours.  -ENT will follow.   1. Thrombocytopenia certainly increases risk of bleeding in this cold dry environment. Platelet transfusions as needed. 2. Maintain tight BP control, as hypertension will certainly increase epistaxis occurrences.  3. Reduce nasal manipulation - no nose picking, no tissues in the nose other than for dabbing. No nose blowing.   4. Once one epistaxis episode occurs, it is common for some minor dripping to occur again over the next couple of days. If it does, use Afrin nasal spray for ACTIVE bleeding on the bleeding side and hold very firm anterior nasal pressure for 10-15 mins for ACTIVE bleeding. Put the bottle of Afrin parallel to the floor and pour medicine in nose, not spray. Recommend keeping a bottle of Afrin at the  bedside just in case. 5. Use Ocean nasal spray q2 hr while awake to moisten the nasal mucosa to decrease nasal crusting and cracking. Use nasal  saline spray for congestion instead of blowing the nose. After using the nasal saline spray, there may be some old blood that comes out with the irrigation; this is normal.     Gavin Pound, MD  [facility], [place] Office phone [phone]

## 2017-02-25 NOTE — ED Triage Notes (Signed)
Pt from home. EMS reports that pt woke up with a nosebleed this am. No known trauma or irritants at home. Pt is on blood thinners. EMS gave afrin which decreased bleeding. Complains of dizziness and lightheadedness with EMS.

## 2017-02-25 NOTE — ED Notes (Signed)
Report on pt given to [facility].

## 2017-02-25 NOTE — ED Notes (Signed)
[facility] was notified of pt's need of transportation to [facility]ED. 

## 2017-02-25 NOTE — Consult Note (Signed)
Chief Complaint: I have a nose bleed  Referring Physician(s): Dr. Blenda Nicely, ENT  Supervising Physician: Corrie Mckusick  Patient Status: [facility] - ED  History of Present Illness: [name] is a 66 y.o. male presenting to [place] with <24 hours of a nose bleed.   He is here with his wife.    They tell me that the bleeding started this morning, and he was unable to get it stopped with usual care.  This is the first time that this has ever happened.  He has never had such a problem.    He initially presented to [facility] ED, and was seen by Dr. Blenda Nicely of ENT.  The bleeding is left-sided.  He has been packed and repacked with cotton during the day, as well as rhinorocket treatment.  Currently his right is not packed.  During our conversation he has trace new blood from the left nasal passage, and he has expectorated slight bloody sputum.    At the bedside, his SBP is 150-165.  His HR is 85-95.  His O2 saturation is high 90's.    His hemoglobin was 11 on presentation to the ED, with most recent repeat showing 9.5.  Creatinine 1.2.  INR is normal.    Currently, he is uncomfortable with the packing in his nose and clamp anteriorly, although he is currently breathing comfortably with good O2 saturation.    Additionally, he is gentleman with extensive vascular disease, including non-repaired AAA, aortoiliac occlusive disease, and cerebrovascular disease.    He tells me that he had bilateral CEA performed ~2006.  He has had prior left iliac stenting, attempt at right iliac revascularization, CABG performed at [facility], and is followed for his AAA at [facility].  Additional CV risk factors include known CAD, HTN, hyperlipidemia, known stroke.  He is currently taking ASA and plavix daily, with last dose of plavix this am.    He does have a history of PE after his CABG in 2016, and they tell me that he was treated with 6 months of coumadin. He never took blood thinners after the initial 6 months.     Regarding vascular access planning for this gentleman with extensive disease, we are limited in our available imaging.  He has a PE study dated 01/01/2015, and prior abd CT 06/15/2011.    He tells me that, other than the previous right iliac artery attempt, he has not had any vascular surgery performed after his CABG in 2016.    Past Medical History:  Diagnosis Date  . AAA (abdominal aortic aneurysm) ([place])   . Anxiety   . CHF (congestive heart failure) ([place])   . COPD (chronic obstructive pulmonary disease) ([place])   . Coronary artery disease   . GERD (gastroesophageal reflux disease)   . History of blood transfusion   . Hyperlipidemia   . Hypertension    takes meds daily  . Hyponatremia   . Lumbar degenerative disc disease   . Myocardial infarction ([place])   . PAD (peripheral artery disease) ([place])   . PONV (postoperative nausea and vomiting)   . Raynaud's disease /phenomenon   . Stroke [facility]) 10/15/2005    Past Surgical History:  Procedure Laterality Date  . ABDOMINAL ANGIOGRAM  03/30/2012  . ABDOMINAL AORTAGRAM N/A 03/30/2012   Procedure: ABDOMINAL Maxcine Ham;  Surgeon: Conrad Duluth, MD;  Location: [facility] CATH LAB;  Service: Cardiovascular;  Laterality: N/A;  . CARDIAC CATHETERIZATION  2006  . CARDIAC CATHETERIZATION N/A 12/09/2014   Procedure: Left  Heart Cath and Coronary Angiography;  Surgeon: [name], MD;  Location: [place] CV LAB;  Service: Cardiovascular;  Laterality: N/A;  . CAROTID ENDARTERECTOMY     bilateral  . CAROTID ENDARTERECTOMY  04/13/2005   Right and 06/03/2005 Left  . CORONARY ARTERY BYPASS GRAFT N/A 12/13/2014   Procedure: CORONARY ARTERY BYPASS GRAFT times five using left internal mammary artery and endoscopic right leg saphenous vein harvest;  Surgeon: Gaye Pollack, MD;  Location: [place] OR;  Service: Open Heart Surgery;  Laterality: N/A;  . HERNIA REPAIR    . ILIAC ARTERY STENT  10/15/2005    Left common and external   . INTRAOPERATIVE TRANSESOPHAGEAL  ECHOCARDIOGRAM N/A 12/13/2014   Procedure: INTRAOPERATIVE TRANSESOPHAGEAL ECHOCARDIOGRAM;  Surgeon: Gaye Pollack, MD;  Location: [facility] OR;  Service: Open Heart Surgery;  Laterality: N/A;    Allergies: Hctz [hydrochlorothiazide]; Norvasc [amlodipine besylate]; Pletal [cilostazol]; and Heparin  Medications: Prior to Admission medications   Medication Sig Start Date End Date Taking? Authorizing Provider  Ascorbic Acid (VITAMIN C) 100 MG tablet Take 100 mg by mouth daily.   Yes [provider]  aspirin 81 MG tablet Take 81 mg by mouth every evening.    Yes [provider]  carvedilol (COREG) 25 MG tablet Take 1 tablet (25 mg total) by mouth 2 (two) times daily. 12/20/16  Yes Burtis Junes, NP  clobetasol ointment (TEMOVATE) 0.05 % apply to affected area twice a day if needed 09/24/15  Yes [provider]  cloNIDine (CATAPRES) 0.1 MG tablet Take 1 tablet (0.1 mg total) by mouth at bedtime. 02/18/16  Yes Burtis Junes, NP  clopidogrel (PLAVIX) 75 MG tablet take 1 tablet by mouth once daily 06/21/16  Yes Burtis Junes, NP  ferrous sulfate 325 (65 FE) MG EC tablet Take 325 mg by mouth daily with breakfast.   Yes [provider]  folic acid (FOLVITE) 1 MG tablet Take 1 tablet (1 mg total) by mouth daily. 01/29/15  Yes Burtis Junes, NP  furosemide (LASIX) 40 MG tablet Take 0.5  Tablet (20 mg) by mouth daily. May take and additional 0.5 tablet (20 mg) if needed for swelling.   Yes [provider]  levocetirizine (XYZAL) 5 MG tablet TAKE ONE TABLET ONCE DAILY AS DIRECTED 02/25/16  Yes Bobbitt, Sedalia Muta, MD  lisinopril (PRINIVIL,ZESTRIL) 5 MG tablet take 1 tablet by mouth once daily 09/14/16  Yes [name], MD  NITROSTAT 0.4 MG SL tablet Place 1 tablet (0.4 mg total) under the tongue every 5 (five) minutes as needed. Chest pain 09/27/16  Yes Burtis Junes, NP  pantoprazole (PROTONIX) 40 MG tablet Take 40 mg by mouth daily. 01/11/17  Yes [provider]  ranitidine (ZANTAC) 300 MG tablet take 1 tablet by mouth at bedtime 03/22/16  Yes [name], MD  rosuvastatin (CRESTOR) 20 MG tablet Take 1 tablet (20 mg total) by mouth at bedtime. 12/20/16  Yes [name], MD  Thiamine HCl (VITAMIN B-1) 100 MG tablet Take 100 mg by mouth daily.     Yes [provider]  traMADol (ULTRAM) 50 MG tablet Take 1 tablet (50 mg total) by mouth every 6 (six) hours as needed. 01/13/15  Yes Burtis Junes, NP  triamcinolone ointment (KENALOG) 0.1 % Apply 1 application topically 2 (two) times daily. 08/05/15  Yes Bobbitt, Sedalia Muta, MD     Family History  Problem Relation Age of Onset  . Coronary artery disease Father 33  HTN, Hyperlipidemia died  . Heart disease Father        Heart Disease before age 30  . Hypertension Father   . Hyperlipidemia Father   . Peripheral vascular disease Father   . Other Mother 70        died rheumatoid arthritis  . Dementia Mother   . Heart disease Brother   . Hypertension Brother   . Hyperlipidemia Brother     Social History   Social History  . Marital status: Married    Spouse name: N/A  . Number of children: N/A  . Years of education: N/A   Occupational History  . MANAGER. owns theaters [employer]   Social History Main Topics  . Smoking status: Former Smoker    Types: Cigarettes    Quit date: 05/24/2005  . Smokeless tobacco: Never Used  . Alcohol use 14.4 oz/week    24 Cans of beer per week     Comment: approximately 24 beer a week  . Drug use: No  . Sexual activity: Not Asked   Other Topics Concern  . None   Social History Narrative  . None     Review of Systems: A 12 point ROS discussed and pertinent positives are indicated in the HPI above.  All other systems are negative.  Review of Systems  Vital Signs: BP (!) 164/97   Pulse 92   Resp (!) 21   Ht 5' 5.5" (1.664 m)   Wt 212 lb (96.2 kg)   SpO2 96%   BMI 34.74 kg/m   Physical  Exam General: 66 yo male appearing stated age.  Well-developed, well-nourished.  Uncomfortable on the stretcher, though calm.  HEENT: Atraumatic, normocephalic.  Conjugate gaze, extra-ocular motor intact. No scleral icterus or scleral injection. Nasal packing on the left with tether freely hanging.  Slight fresh blood at the packing site.  Oral mucosa moist, pink.  Neck: Symmetric with no goiter enlargement.  Chest/Lungs:  Symmetric chest with inspiration/expiration.  No labored breathing.  Clear to auscultation with no wheezes, rhonchi, or rales.  Heart:  RRR, with no third heart sounds appreciated. No JVD appreciated.  Abdomen:  Soft, NT/ND, with + bowel sounds.   Genito-urinary: Deferred Neurologic: Alert & Oriented to person, place, and time.   Normal affect and insight.  Appropriate questions.  Moving all 4 extremities with gross sensory intact.  CN 3-12 intact.   Pulse Exam:  Faint bruit at the right neck. Palpable right carotid pulse.  No bruit on the left, with no pulse palpable.   Non-palpable right femoral, right popliteal, and right pedal pulses.   Palpable left CFA, popliteal artery, and PT pulse.   Imaging: No results found.  Labs:  CBC:  Recent Labs  05/31/16 0950 01/25/17 0922 02/25/17 1148 02/25/17 1935  WBC 8.4 7.4 9.2  --   HGB 11.2* 10.3* 11.0* 9.5*  HCT 31.5* 29.8* 31.0* 26.8*  PLT 187 187 184  --     COAGS:  Recent Labs  02/25/17 1352  INR 0.98    BMP:  Recent Labs  09/27/16 0916 10/11/16 1010 01/25/17 0922 02/25/17 1148  NA 124* 122* 134 129*  K 4.5 4.7 4.4 4.3  CL 88* 90* 94* 93*  CO2 22 18 23 25   GLUCOSE 96 93 99 124*  BUN 17 31* 23 21*  CALCIUM 8.8 9.0 9.6 9.4  CREATININE 1.31* 1.76* 1.16 1.21  GFRNONAA 56* 39* 64 60*  GFRAA 64 45* 74 >60  LIVER FUNCTION TESTS:  Recent Labs  01/25/17 0922  BILITOT 1.0  AST 12  ALT 8  ALKPHOS 62  PROT 6.5  ALBUMIN 4.0    TUMOR MARKERS: No results for input(s): AFPTM, CEA, CA199,  CHROMGRNA in the last 8760 hours.  Assessment and Plan:  [name] is 66 year old male presenting with first episode of epistaxis, currently refractory to conservative packing measures.    He has been therapeutic with dual anti-platelet medication, as maximal medical therapy for his extensive CVD, CAD, PAD and AAA history.    Cerebral angiogram and embolization of epistaxis is certainly reasonable to consider given that he has had oozing through the conservative packing measures.  I discussed briefly with [name] and his wife the pathophysiology and the concepts of angiogram and embolization.  They are well informed regarding angiogram and endovascular techniques, given his history.    I performed a complete informed consent regarding possible angiogram and epistaxis embolization, with risk including: bleeding, infection, arterial injury/dissection, non-target embolization, renal failure, contrast reaction, ~1.4% risk of stroke, septal ischemia, palate ischemia, septal perforation, blindness, need for further procedure/surgery, cardiopulmonary collapse, death.    I did inform them that technically successful epistaxis embolization is very effective for halting the bleeding, with rates reported of 95-100%.   I was forthcoming about obstacles to treat his left sided epistaxis, as his prior chest CT shows severe calcified plaque at the origin of the left CCA, with obvious narrowing.  No duplex is available to review.  I think his risk for left sided stroke is above the baseline for all-comers, which I informed them.    Given his current vital signs and the fact that he is therapeutic with dual anti-platelet agents (ASA/plavix), I believe a reasonable approach would be to observe overnight and administer platelets.  The main concern is navigating through severe left CCA calcified disease that is present on his most recent chest CT.   I discussed options of overnight observation with medical  support versus angiogram for attempt at embolization.   He and his wife prefer to observe with medical support at this time.    Plan: - awaiting bed placement - VIR will follow.  If status change overnight we are available and ready for angiogram.  - platelets as need  - agree with target normal blood pressure - call with questions/concerns  [phone]   Thank you for this interesting consult.  I greatly enjoyed meeting [name] and look forward to participating in their care.  A copy of this report was sent to the requesting provider on this date.  Electronically Signed: Corrie Mckusick, DO 02/25/2017, 10:40 PM  I spent a total of 55 [place]    in face to face in clinical consultation, greater than 50% of which was counseling/coordinating care for epistaxis, possible cervical/cerebral angiogram/embolization

## 2017-02-25 NOTE — ED Provider Notes (Signed)
[facility] DEPT Provider Note   CSN: [id number] Arrival date & time: 02/25/17  1137     History   Chief Complaint Chief Complaint  Patient presents with  . Epistaxis    HPI HERMINIO KNISKERN is a 66 y.o. male.  66 year old male history of CAD on DAPT S/P CABG, AAA, HTN, PAD, DVT who presents as a transfer from [facility] for continued epistaxis.  Patient describes onset of left epistaxis around 10 AM.  He presented to OSH where multiple therpaies were unsuccessful for achieving hemostasis.  He was evaluated by ENT and continued to bleed despite cauterization.  Posterior nasal packing was performed.  He was sent to [facility] ED for IR evaluation and possible embolization.    The history is provided by the patient, medical records and the spouse. No language interpreter was used.    Past Medical History:  Diagnosis Date  . AAA (abdominal aortic aneurysm) ([place])   . Anxiety   . CHF (congestive heart failure) ([place])   . COPD (chronic obstructive pulmonary disease) ([place])   . Coronary artery disease   . GERD (gastroesophageal reflux disease)   . History of blood transfusion   . Hyperlipidemia   . Hypertension    takes meds daily  . Hyponatremia   . Lumbar degenerative disc disease   . Myocardial infarction ([place])   . PAD (peripheral artery disease) ([place])   . PONV (postoperative nausea and vomiting)   . Raynaud's disease /phenomenon   . Stroke [facility]) 10/15/2005    Patient Active Problem List   Diagnosis Date Noted  . Epistaxis 02/25/2017  . Hypertensive urgency 02/25/2017  . Urticaria with associated angioedema 08/05/2015  . Angioedema 07/22/2015  . Encounter for therapeutic drug monitoring 01/08/2015  . Acute pulmonary embolism ([place]) 01/03/2015  . Acute DVT (deep venous thrombosis) ([place]) 12/27/2014  . S/P CABG x 5 12/13/14 12/27/2014  . Anemia 12/27/2014  . Acute renal failure ([place]) 12/27/2014  . AKI (acute kidney injury) ([place])   . Absolute anemia   . CAD (coronary artery  disease) 12/13/2014  . NSTEMI (non-ST elevated myocardial infarction) ([place]) 12/09/2014  . Hyponatremia   . Unstable angina pectoris ([place]) 12/07/2014  . Thoracic aneurysm without mention of rupture 04/14/2012  . Intermittent claudication ([place]) 03/10/2012  . Atherosclerosis of native arteries of extremity with intermittent claudication ([place]) 10/08/2011  . AAA (abdominal aortic aneurysm) ([place]) 09/07/2011  . Carotid stenosis, bilateral 09/07/2011  . Atherosclerosis of native coronary artery with unstable angina pectoris ([place]) 01/03/2011  . Essential hypertension, benign 01/03/2011  . Hyperlipidemia with target LDL less than 70 01/03/2011  . PAD (peripheral artery disease) ([place]) 01/03/2011    Past Surgical History:  Procedure Laterality Date  . ABDOMINAL ANGIOGRAM  03/30/2012  . ABDOMINAL AORTAGRAM N/A 03/30/2012   Procedure: ABDOMINAL Maxcine Ham;  Surgeon: Conrad [place], MD;  Location: [facility] CATH LAB;  Service: Cardiovascular;  Laterality: N/A;  . CARDIAC CATHETERIZATION  2006  . CARDIAC CATHETERIZATION N/A 12/09/2014   Procedure: Left Heart Cath and Coronary Angiography;  Surgeon: [name], MD;  Location: [facility] CV LAB;  Service: Cardiovascular;  Laterality: N/A;  . CAROTID ENDARTERECTOMY     bilateral  . CAROTID ENDARTERECTOMY  04/13/2005   Right and 06/03/2005 Left  . CORONARY ARTERY BYPASS GRAFT N/A 12/13/2014   Procedure: CORONARY ARTERY BYPASS GRAFT times five using left internal mammary artery and endoscopic right leg saphenous vein harvest;  Surgeon: Gaye Pollack, MD;  Location: [facility] OR;  Service: Open Heart  Surgery;  Laterality: N/A;  . HERNIA REPAIR    . ILIAC ARTERY STENT  10/15/2005    Left common and external   . INTRAOPERATIVE TRANSESOPHAGEAL ECHOCARDIOGRAM N/A 12/13/2014   Procedure: INTRAOPERATIVE TRANSESOPHAGEAL ECHOCARDIOGRAM;  Surgeon: Gaye Pollack, MD;  Location[facility];  Service: Open Heart Surgery;  Laterality: N/A;       Home Medications    Prior to  Admission medications   Medication Sig Start Date End Date Taking? Authorizing Provider  Ascorbic Acid (VITAMIN C) 100 MG tablet Take 100 mg by mouth daily.   Yes [provider]  aspirin 81 MG tablet Take 81 mg by mouth every evening.    Yes [provider]  carvedilol (COREG) 25 MG tablet Take 1 tablet (25 mg total) by mouth 2 (two) times daily. 12/20/16  Yes Burtis Junes, NP  clobetasol ointment (TEMOVATE) 0.05 % apply to affected area twice a day if needed 09/24/15  Yes [provider]  cloNIDine (CATAPRES) 0.1 MG tablet Take 1 tablet (0.1 mg total) by mouth at bedtime. 02/18/16  Yes Burtis Junes, NP  clopidogrel (PLAVIX) 75 MG tablet take 1 tablet by mouth once daily 06/21/16  Yes Burtis Junes, NP  ferrous sulfate 325 (65 FE) MG EC tablet Take 325 mg by mouth daily with breakfast.   Yes [provider]  folic acid (FOLVITE) 1 MG tablet Take 1 tablet (1 mg total) by mouth daily. 01/29/15  Yes Burtis Junes, NP  furosemide (LASIX) 40 MG tablet Take 0.5  Tablet (20 mg) by mouth daily. May take and additional 0.5 tablet (20 mg) if needed for swelling.   Yes [provider]  levocetirizine (XYZAL) 5 MG tablet TAKE ONE TABLET ONCE DAILY AS DIRECTED 02/25/16  Yes Bobbitt, [place] Muta, MD  lisinopril (PRINIVIL,ZESTRIL) 5 MG tablet take 1 tablet by mouth once daily 09/14/16  Yes [name], MD  NITROSTAT 0.4 MG SL tablet Place 1 tablet (0.4 mg total) under the tongue every 5 (five) minutes as needed. Chest pain 09/27/16  Yes Burtis Junes, NP  pantoprazole (PROTONIX) 40 MG tablet Take 40 mg by mouth daily. 01/11/17  Yes [provider]  ranitidine (ZANTAC) 300 MG tablet take 1 tablet by mouth at bedtime 03/22/16  Yes [name], MD  rosuvastatin (CRESTOR) 20 MG tablet Take 1 tablet (20 mg total) by mouth at bedtime. 12/20/16  Yes [name], MD  Thiamine HCl (VITAMIN B-1) 100 MG tablet Take 100 mg by mouth daily.     Yes  [provider]  traMADol (ULTRAM) 50 MG tablet Take 1 tablet (50 mg total) by mouth every 6 (six) hours as needed. 01/13/15  Yes Burtis Junes, NP  triamcinolone ointment (KENALOG) 0.1 % Apply 1 application topically 2 (two) times daily. 08/05/15  Yes Bobbitt, [place] Muta, MD    Family History Family History  Problem Relation Age of Onset  . Coronary artery disease Father 34       HTN, Hyperlipidemia died  . Heart disease Father        Heart Disease before age 72  . Hypertension Father   . Hyperlipidemia Father   . Peripheral vascular disease Father   . Other Mother 17        died rheumatoid arthritis  . Dementia Mother   . Heart disease Brother   . Hypertension Brother   . Hyperlipidemia Brother     Social History Social History  Substance Use Topics  .  Smoking status: Former Smoker    Types: Cigarettes    Quit date: 05/24/2005  . Smokeless tobacco: Never Used  . Alcohol use 14.4 oz/week    24 Cans of beer per week     Comment: approximately 24 beer a week     Allergies   Hctz [hydrochlorothiazide]; Norvasc [amlodipine besylate]; Pletal [cilostazol]; and Heparin   Review of Systems Review of Systems  Constitutional: Negative for chills and fever.  HENT: Positive for nosebleeds. Negative for ear pain and sore throat.   Eyes: Negative for pain and visual disturbance.  Respiratory: Negative for cough and shortness of breath.   Cardiovascular: Negative for chest pain and palpitations.  Gastrointestinal: Negative for abdominal pain and vomiting.  Genitourinary: Negative for dysuria and hematuria.  Musculoskeletal: Negative for arthralgias and back pain.  Skin: Negative for color change and rash.  Neurological: Negative for seizures and syncope.  All other systems reviewed and are negative.    Physical Exam Updated Vital Signs BP (!) 145/94   Pulse 98   Resp 17   Ht 5' 5.5" (1.664 m)   Wt 96.2 kg (212 lb)   SpO2 96%   BMI 34.74 kg/m   Physical  Exam  Constitutional: He appears well-developed.  HENT:  Head: Normocephalic and atraumatic.  Minimal oozing of nasal packing of L nare. Small amounts of post-nasal drip. Produces small bright blood from posterior oropharynx with occasional cough  Eyes: Conjunctivae are normal.  Neck: Neck supple.  Cardiovascular: Normal rate and regular rhythm.   No murmur heard. Pulmonary/Chest: Effort normal and breath sounds normal. No respiratory distress.  Abdominal: Soft. There is no tenderness.  Musculoskeletal: He exhibits no edema.  Neurological: He is alert. No cranial nerve deficit. Coordination normal.  5/5 motor strength and intact sensation in all extremities. Intact bilateral finger-to-nose coordination  Skin: Skin is warm and dry.  Nursing note and vitals reviewed.    ED Treatments / Results  Labs (all labs ordered are listed, but only abnormal results are displayed) Labs Reviewed  BASIC METABOLIC PANEL - Abnormal; Notable for the following:       Result Value   Sodium 129 (*)    Chloride 93 (*)    Glucose, Bld 124 (*)    BUN 21 (*)    GFR calc non Af Amer 60 (*)    All other components within normal limits  CBC - Abnormal; Notable for the following:    RBC 3.22 (*)    Hemoglobin 11.0 (*)    HCT 31.0 (*)    MCH 34.2 (*)    All other components within normal limits  URINALYSIS, ROUTINE W REFLEX MICROSCOPIC - Abnormal; Notable for the following:    Color, Urine STRAW (*)    Protein, ur 30 (*)    Squamous Epithelial / LPF 0-5 (*)    All other components within normal limits  HEMOGLOBIN AND HEMATOCRIT, BLOOD - Abnormal; Notable for the following:    Hemoglobin 9.5 (*)    HCT 26.8 (*)    All other components within normal limits  CBC - Abnormal; Notable for the following:    RBC 2.84 (*)    Hemoglobin 9.4 (*)    HCT 27.4 (*)    All other components within normal limits  CBG MONITORING, ED - Abnormal; Notable for the following:    Glucose-Capillary 126 (*)    All other  components within normal limits  PROTIME-INR  TYPE AND SCREEN    EKG  EKG  Interpretation None       Radiology No results found.  Procedures Procedures (including critical care time)  Medications Ordered in ED Medications  oxymetazoline (AFRIN) 0.05 % nasal spray (not administered)  ampicillin-sulbactam (UNASYN) 1.5 g in sodium chloride 0.9 % 50 mL IVPB (not administered)  metoprolol tartrate (LOPRESSOR) injection 5 mg (not administered)  silver nitrate applicators applicator 1 Stick (1 Stick Topical Given by Other 02/25/17 1343)  sodium chloride 0.9 % bolus 1,000 mL (0 mLs Intravenous Stopped 02/25/17 1710)  cloNIDine (CATAPRES) tablet 0.1 mg (0.1 mg Oral Given 02/25/17 1429)  labetalol (NORMODYNE,TRANDATE) injection 10 mg (10 mg Intravenous Given 02/25/17 1546)  labetalol (NORMODYNE,TRANDATE) injection 10 mg (10 mg Intravenous Given 02/25/17 1704)  nitroGLYCERIN 50 mg in dextrose 5 % 250 mL (0.2 mg/mL) infusion (50 mcg/min Intravenous Rate/Dose Change 02/25/17 1837)  lidocaine (XYLOCAINE) 2 % injection 20 mL (20 mLs Other Given by Other 02/25/17 1841)     Initial Impression / Assessment and Plan / ED Course  I have reviewed the triage vital signs and the nursing notes.  Pertinent labs & imaging results that were available during my care of the patient were reviewed by me and considered in my medical decision making (see chart for details).     57 yoM h/o CAD on DAPT S/P CABG, AAA, HTN, PAD, DVT who p/w persistent epistaxis failing ENT cauterization. Arrived on NTG gtt due to hypertension SBP>200 at OSH. Slow ooze noted, no active hemorrhage. AF, BP 140s/80s, VSS. Neuro IR consulted and evaluated pt. Pt is a poor candidate for IR intervention currently due to carotid vascular disease and high risk for stroke.   Spoke with ENT. Plan for medical admission and optimization. Discussed platelet transfusion in setting of chronic DAPT regimen. Unasyn given for empiric prophylaxis of  posterior nasal packing. Pt admitted to SDU for further management and evaluation.  Pt care d/w Dr. Tyrone Nine  Final Clinical Impressions(s) / ED Diagnoses   Final diagnoses:  Epistaxis  Hypertension, unspecified type    New Prescriptions New Prescriptions   No medications on file     Payton Emerald, MD 02/26/17 [place], [place], DO 02/27/17 1434

## 2017-02-25 NOTE — ED Notes (Signed)
[facility] here to transport pt to [facility]. 

## 2017-02-25 NOTE — ED Notes (Signed)
PA made aware patient is still bleeding.

## 2017-02-25 NOTE — ED Notes (Signed)
PA made aware of patient's trending blood pressures.

## 2017-02-25 NOTE — ED Notes (Signed)
Dr. Blenda Nicely at bedside.

## 2017-02-25 NOTE — ED Provider Notes (Signed)
[facility] DEPT Provider Note   CSN: [id number] Arrival date & time: 02/25/17  1137     History   Chief Complaint Chief Complaint  Patient presents with  . Epistaxis    HPI [name] is a 66 y.o. male.  HPI   66 year old male presents today with complaints of epistaxis.  Patient notes that he woke up this morning and had left nosebleed.  He notes direct pressure did not improve this and it continued to worsen.  Denies any trauma to the nose, recent illness.  He reports a broken nose in the past that caused significant nosebleed as of not no previous epistaxis.  Patient reports that he takes antihistamines as he recently started to have a rash, also notes he is taking Plavix.  Patient notes bruising throughout upper and lower extremities which is his baseline.  Patient report prior to arrival he felt slightly nauseous and dizzy.  Past Medical History:  Diagnosis Date  . AAA (abdominal aortic aneurysm) ([place])   . Anxiety   . CHF (congestive heart failure) ([place])   . COPD (chronic obstructive pulmonary disease) ([place])   . Coronary artery disease   . GERD (gastroesophageal reflux disease)   . History of blood transfusion   . Hyperlipidemia   . Hypertension    takes meds daily  . Hyponatremia   . Lumbar degenerative disc disease   . Myocardial infarction ([place])   . PAD (peripheral artery disease) ([place])   . PONV (postoperative nausea and vomiting)   . Raynaud's disease /phenomenon   . Stroke [facility]) 10/15/2005    Patient Active Problem List   Diagnosis Date Noted  . Epistaxis 02/25/2017  . Hypertensive urgency 02/25/2017  . Urticaria with associated angioedema 08/05/2015  . Angioedema 07/22/2015  . Encounter for therapeutic drug monitoring 01/08/2015  . Acute pulmonary embolism ([place]) 01/03/2015  . Acute DVT (deep venous thrombosis) ([place]) 12/27/2014  . S/P CABG x 5 12/13/14 12/27/2014  . Anemia 12/27/2014  . Acute renal failure ([place]) 12/27/2014  . AKI (acute kidney  injury) ([place])   . Absolute anemia   . CAD (coronary artery disease) 12/13/2014  . NSTEMI (non-ST elevated myocardial infarction) ([place]) 12/09/2014  . Hyponatremia   . Unstable angina pectoris ([place]) 12/07/2014  . Thoracic aneurysm without mention of rupture 04/14/2012  . Intermittent claudication ([place]) 03/10/2012  . Atherosclerosis of native arteries of extremity with intermittent claudication ([place]) 10/08/2011  . AAA (abdominal aortic aneurysm) ([place]) 09/07/2011  . Carotid stenosis, bilateral 09/07/2011  . Atherosclerosis of native coronary artery with unstable angina pectoris ([place]) 01/03/2011  . Essential hypertension, benign 01/03/2011  . Hyperlipidemia with target LDL less than 70 01/03/2011  . PAD (peripheral artery disease) ([place]) 01/03/2011    Past Surgical History:  Procedure Laterality Date  . ABDOMINAL ANGIOGRAM  03/30/2012  . ABDOMINAL AORTAGRAM N/A 03/30/2012   Procedure: ABDOMINAL Maxcine Ham;  Surgeon: Conrad Greenwood, MD;  Location: [facility] CATH LAB;  Service: Cardiovascular;  Laterality: N/A;  . CARDIAC CATHETERIZATION  2006  . CARDIAC CATHETERIZATION N/A 12/09/2014   Procedure: Left Heart Cath and Coronary Angiography;  Surgeon: [name], MD;  Location: [facility] CV LAB;  Service: Cardiovascular;  Laterality: N/A;  . CAROTID ENDARTERECTOMY     bilateral  . CAROTID ENDARTERECTOMY  04/13/2005   Right and 06/03/2005 Left  . CORONARY ARTERY BYPASS GRAFT N/A 12/13/2014   Procedure: CORONARY ARTERY BYPASS GRAFT times five using left internal mammary artery and endoscopic right leg saphenous vein harvest;  Surgeon:  Gaye Pollack, MD;  Location: [facility];  Service: Open Heart Surgery;  Laterality: N/A;  . HERNIA REPAIR    . ILIAC ARTERY STENT  10/15/2005    Left common and external   . INTRAOPERATIVE TRANSESOPHAGEAL ECHOCARDIOGRAM N/A 12/13/2014   Procedure: INTRAOPERATIVE TRANSESOPHAGEAL ECHOCARDIOGRAM;  Surgeon: Gaye Pollack, MD;  Location: [facility] OR;  Service: Open Heart Surgery;   Laterality: N/A;       Home Medications    Prior to Admission medications   Medication Sig Start Date End Date Taking? Authorizing Provider  Ascorbic Acid (VITAMIN C) 100 MG tablet Take 100 mg by mouth daily.   Yes [provider]  aspirin 81 MG tablet Take 81 mg by mouth every evening.    Yes [provider]  carvedilol (COREG) 25 MG tablet Take 1 tablet (25 mg total) by mouth 2 (two) times daily. 12/20/16  Yes Burtis Junes, NP  clobetasol ointment (TEMOVATE) 0.05 % apply to affected area twice a day if needed 09/24/15  Yes [provider]  cloNIDine (CATAPRES) 0.1 MG tablet Take 1 tablet (0.1 mg total) by mouth at bedtime. 02/18/16  Yes Burtis Junes, NP  clopidogrel (PLAVIX) 75 MG tablet take 1 tablet by mouth once daily 06/21/16  Yes Burtis Junes, NP  ferrous sulfate 325 (65 FE) MG EC tablet Take 325 mg by mouth daily with breakfast.   Yes [provider]  folic acid (FOLVITE) 1 MG tablet Take 1 tablet (1 mg total) by mouth daily. 01/29/15  Yes Burtis Junes, NP  furosemide (LASIX) 40 MG tablet Take 0.5  Tablet (20 mg) by mouth daily. May take and additional 0.5 tablet (20 mg) if needed for swelling.   Yes [provider]  levocetirizine (XYZAL) 5 MG tablet TAKE ONE TABLET ONCE DAILY AS DIRECTED 02/25/16  Yes Bobbitt, Sed[place] Muta, MD  lisinopril (PRINIVIL,ZESTRIL) 5 MG tablet take 1 tablet by mouth once daily 09/14/16  Yes [name], MD  NITROSTAT 0.4 MG SL tablet Place 1 tablet (0.4 mg total) under the tongue every 5 (five) minutes as needed. Chest pain 09/27/16  Yes Burtis Junes, NP  pantoprazole (PROTONIX) 40 MG tablet Take 40 mg by mouth daily. 01/11/17  Yes [provider]  ranitidine (ZANTAC) 300 MG tablet take 1 tablet by mouth at bedtime 03/22/16  Yes [name], MD  rosuvastatin (CRESTOR) 20 MG tablet Take 1 tablet (20 mg total) by mouth at bedtime. 12/20/16  Yes [name], MD  Thiamine HCl (VITAMIN  B-1) 100 MG tablet Take 100 mg by mouth daily.     Yes [provider]  traMADol (ULTRAM) 50 MG tablet Take 1 tablet (50 mg total) by mouth every 6 (six) hours as needed. 01/13/15  Yes Burtis Junes, NP  triamcinolone ointment (KENALOG) 0.1 % Apply 1 application topically 2 (two) times daily. 08/05/15  Yes Bobbitt, [place] Muta, MD    Family History Family History  Problem Relation Age of Onset  . Coronary artery disease Father 58       HTN, Hyperlipidemia died  . Heart disease Father        Heart Disease before age 78  . Hypertension Father   . Hyperlipidemia Father   . Peripheral vascular disease Father   . Other Mother 42        died rheumatoid arthritis  . Dementia Mother   . Heart disease Brother   . Hypertension Brother   . Hyperlipidemia Brother  Social History Social History  Substance Use Topics  . Smoking status: Former Smoker    Types: Cigarettes    Quit date: 05/24/2005  . Smokeless tobacco: Never Used  . Alcohol use 14.4 oz/week    24 Cans of beer per week     Comment: approximately 24 beer a week     Allergies   Hctz [hydrochlorothiazide]; Norvasc [amlodipine besylate]; Pletal [cilostazol]; and Heparin   Review of Systems Review of Systems  All other systems reviewed and are negative.    Physical Exam Updated Vital Signs BP (!) 164/97   Pulse 92   Resp (!) 21   Ht 5' 5.5" (1.664 m)   Wt 96.2 kg (212 lb)   SpO2 96%   BMI 34.74 kg/m   Physical Exam  Constitutional: He is oriented to person, place, and time. He appears well-developed and well-nourished.  HENT:  Head: Normocephalic and atraumatic.  Bleeding right nare- no obvious source seen on exam   Eyes: Pupils are equal, round, and reactive to light. Conjunctivae are normal. Right eye exhibits no discharge. Left eye exhibits no discharge. No scleral icterus.  Neck: Normal range of motion. No JVD present. No tracheal deviation present.  Pulmonary/Chest: Effort normal. No stridor.   Neurological: He is alert and oriented to person, place, and time. Coordination normal.  Skin:  Bruising noted to throughout upper and lower extremities   Psychiatric: He has a normal mood and affect. His behavior is normal. Judgment and thought content normal.  Nursing note and vitals reviewed.    ED Treatments / Results  Labs (all labs ordered are listed, but only abnormal results are displayed) Labs Reviewed  BASIC METABOLIC PANEL - Abnormal; Notable for the following:       Result Value   Sodium 129 (*)    Chloride 93 (*)    Glucose, Bld 124 (*)    BUN 21 (*)    GFR calc non Af Amer 60 (*)    All other components within normal limits  CBC - Abnormal; Notable for the following:    RBC 3.22 (*)    Hemoglobin 11.0 (*)    HCT 31.0 (*)    MCH 34.2 (*)    All other components within normal limits  URINALYSIS, ROUTINE W REFLEX MICROSCOPIC - Abnormal; Notable for the following:    Color, Urine STRAW (*)    Protein, ur 30 (*)    Squamous Epithelial / LPF 0-5 (*)    All other components within normal limits  HEMOGLOBIN AND HEMATOCRIT, BLOOD - Abnormal; Notable for the following:    Hemoglobin 9.5 (*)    HCT 26.8 (*)    All other components within normal limits  CBG MONITORING, ED - Abnormal; Notable for the following:    Glucose-Capillary 126 (*)    All other components within normal limits  PROTIME-INR    EKG  EKG Interpretation None       Radiology No results found.  Procedures .Epistaxis Management Date/Time: 02/25/2017 1:51 PM Performed by: Penni Bombard, Laela Deviney Authorized by: Penni Bombard, Cariann Kinnamon   Consent:    Consent obtained:  Verbal   Consent given by:  Patient   Risks discussed:  Nasal injury, pain, bleeding and infection   Alternatives discussed:  No treatment, delayed treatment, alternative treatment, observation and referral Anesthesia (see MAR for exact dosages):    Anesthesia method:  None Procedure details:    Treatment site:  L anterior   Treatment  method:  Anterior pack  Treatment complexity:  Limited   Treatment episode: initial   Post-procedure details:    Assessment:  Bleeding stopped   Patient tolerance of procedure:  Tolerated well, no immediate complications        (including critical care time)   CRITICAL CARE Performed by: Elmer Ramp   Total critical care time: 35 minutes  Critical care time was exclusive of separately billable procedures and treating other patients.  Critical care was necessary to treat or prevent imminent or life-threatening deterioration.  Critical care was time spent personally by me on the following activities: development of treatment plan with patient and/or surrogate as well as nursing, discussions with consultants, evaluation of patient's response to treatment, examination of patient, obtaining history from patient or surrogate, ordering and performing treatments and interventions, ordering and review of laboratory studies, ordering and review of radiographic studies, pulse oximetry and re-evaluation of patient's condition.   Medications Ordered in ED Medications  oxymetazoline (AFRIN) 0.05 % nasal spray (not administered)  silver nitrate applicators applicator 1 Stick (1 Stick Topical Given by Other 02/25/17 1343)  sodium chloride 0.9 % bolus 1,000 mL (0 mLs Intravenous Stopped 02/25/17 1710)  cloNIDine (CATAPRES) tablet 0.1 mg (0.1 mg Oral Given 02/25/17 1429)  labetalol (NORMODYNE,TRANDATE) injection 10 mg (10 mg Intravenous Given 02/25/17 1546)  labetalol (NORMODYNE,TRANDATE) injection 10 mg (10 mg Intravenous Given 02/25/17 1704)  nitroGLYCERIN 50 mg in dextrose 5 % 250 mL (0.2 mg/mL) infusion (50 mcg/min Intravenous Rate/Dose Change 02/25/17 1837)  lidocaine (XYLOCAINE) 2 % injection 20 mL (20 mLs Other Given by Other 02/25/17 1841)     Initial Impression / Assessment and Plan / ED Course  I have reviewed the triage vital signs and the nursing notes.  Pertinent labs &  imaging results that were available during my care of the patient were reviewed by me and considered in my medical decision making (see chart for details).      Patient with epistaxis.  Nasal packing with gauze unsuccessful.  This was followed with [employer].  It appeared that initial hemostasis was achieved, although patient continued to have very minimal bleed.  He was monitored here in the ED.  Patient with continued bleed despite packing and no obvious anterior source.  Patient's blood pressure elevated here, given home medications.  Consult with ENT specialist with recommendation of blood pressure management.  Patient was given several doses of labetalol without significant reduction in his blood pressure, continued bleed that eventually started to worsen.  ENT was again consulted who recommended removing packing, using Afrin, nasal packing again as they were prepping for surgery at [facility].  Patient will be started on nitroglycerin drip and attempt to reduce blood pressure with ongoing epistaxis.  No signs of tachycardia at this time.  Nitro drip started, able to control patient's pressure at this time.  Bleeding continues.  ENT at bedside with attempts at hemostasis.  These were unsuccessful.  Specialist discussed transferring to emergency room at [facility] for interventional radiology evaluation and potential intervention, if not patient will likely require OR intervention by ENT.  [facility] contacted for ED to ED transfer.  I spoke with Deno Etienne who is accepted patient in transfer.  Patient will likely need hospital admission status post procedure.    Final Clinical Impressions(s) / ED Diagnoses   Final diagnoses:  Epistaxis  Hypertension, unspecified type    Labs: cbc, bmp, PTINR  Imaging:  Consults: ENT  Therapeutics: afrin  Discharge Meds:   Assessment/Plan: 48 YOM presents  today with epistaxis.  Unknown etiology, likely secondary to alcohol use, Plavix, aspirin,  uncontrolled blood pressure.  Numerous attempts were unsuccessful both by myself and ENT specialist.  Patient transferred to [facility] for further evaluation and management.  Please see clinical course for further details.    New Prescriptions New Prescriptions   No medications on file     Francee Gentile 02/25/17 2238    Julianne Rice, MD 02/28/17 [phone]

## 2017-02-26 ENCOUNTER — Encounter (HOSPITAL_COMMUNITY): Payer: Self-pay | Admitting: Internal Medicine

## 2017-02-26 DIAGNOSIS — E871 Hypo-osmolality and hyponatremia: Secondary | ICD-10-CM

## 2017-02-26 DIAGNOSIS — J449 Chronic obstructive pulmonary disease, unspecified: Secondary | ICD-10-CM | POA: Diagnosis present

## 2017-02-26 DIAGNOSIS — I10 Essential (primary) hypertension: Secondary | ICD-10-CM | POA: Diagnosis not present

## 2017-02-26 DIAGNOSIS — Z87891 Personal history of nicotine dependence: Secondary | ICD-10-CM | POA: Diagnosis not present

## 2017-02-26 DIAGNOSIS — I110 Hypertensive heart disease with heart failure: Secondary | ICD-10-CM | POA: Diagnosis present

## 2017-02-26 DIAGNOSIS — D649 Anemia, unspecified: Secondary | ICD-10-CM | POA: Diagnosis not present

## 2017-02-26 DIAGNOSIS — Z8249 Family history of ischemic heart disease and other diseases of the circulatory system: Secondary | ICD-10-CM | POA: Diagnosis not present

## 2017-02-26 DIAGNOSIS — I160 Hypertensive urgency: Secondary | ICD-10-CM | POA: Diagnosis not present

## 2017-02-26 DIAGNOSIS — R040 Epistaxis: Secondary | ICD-10-CM | POA: Diagnosis not present

## 2017-02-26 DIAGNOSIS — D62 Acute posthemorrhagic anemia: Secondary | ICD-10-CM | POA: Diagnosis present

## 2017-02-26 DIAGNOSIS — Z7982 Long term (current) use of aspirin: Secondary | ICD-10-CM | POA: Diagnosis not present

## 2017-02-26 DIAGNOSIS — I2510 Atherosclerotic heart disease of native coronary artery without angina pectoris: Secondary | ICD-10-CM

## 2017-02-26 DIAGNOSIS — Z8349 Family history of other endocrine, nutritional and metabolic diseases: Secondary | ICD-10-CM | POA: Diagnosis not present

## 2017-02-26 DIAGNOSIS — Z951 Presence of aortocoronary bypass graft: Secondary | ICD-10-CM | POA: Diagnosis not present

## 2017-02-26 DIAGNOSIS — Z79899 Other long term (current) drug therapy: Secondary | ICD-10-CM | POA: Diagnosis not present

## 2017-02-26 DIAGNOSIS — I5032 Chronic diastolic (congestive) heart failure: Secondary | ICD-10-CM | POA: Diagnosis present

## 2017-02-26 DIAGNOSIS — E785 Hyperlipidemia, unspecified: Secondary | ICD-10-CM | POA: Diagnosis not present

## 2017-02-26 DIAGNOSIS — I714 Abdominal aortic aneurysm, without rupture: Secondary | ICD-10-CM | POA: Diagnosis present

## 2017-02-26 DIAGNOSIS — Z7902 Long term (current) use of antithrombotics/antiplatelets: Secondary | ICD-10-CM | POA: Diagnosis not present

## 2017-02-26 DIAGNOSIS — I252 Old myocardial infarction: Secondary | ICD-10-CM | POA: Diagnosis not present

## 2017-02-26 DIAGNOSIS — Z888 Allergy status to other drugs, medicaments and biological substances status: Secondary | ICD-10-CM | POA: Diagnosis not present

## 2017-02-26 DIAGNOSIS — Z86711 Personal history of pulmonary embolism: Secondary | ICD-10-CM | POA: Diagnosis not present

## 2017-02-26 DIAGNOSIS — Z7289 Other problems related to lifestyle: Secondary | ICD-10-CM | POA: Diagnosis not present

## 2017-02-26 DIAGNOSIS — Z8673 Personal history of transient ischemic attack (TIA), and cerebral infarction without residual deficits: Secondary | ICD-10-CM | POA: Diagnosis not present

## 2017-02-26 DIAGNOSIS — Z86718 Personal history of other venous thrombosis and embolism: Secondary | ICD-10-CM | POA: Diagnosis not present

## 2017-02-26 LAB — COMPREHENSIVE METABOLIC PANEL
ALBUMIN: 3.3 g/dL — AB (ref 3.5–5.0)
ALK PHOS: 57 U/L (ref 38–126)
ALT: 13 U/L — AB (ref 17–63)
AST: 19 U/L (ref 15–41)
Anion gap: 11 (ref 5–15)
BUN: 51 mg/dL — AB (ref 6–20)
CALCIUM: 8.9 mg/dL (ref 8.9–10.3)
CO2: 24 mmol/L (ref 22–32)
CREATININE: 1.09 mg/dL (ref 0.61–1.24)
Chloride: 97 mmol/L — ABNORMAL LOW (ref 101–111)
GFR calc Af Amer: >60 mL/min (ref >60)
GFR calc non Af Amer: >60 mL/min (ref >60)
GLUCOSE: 141 mg/dL — AB (ref 65–99)
Potassium: 4 mmol/L (ref 3.5–5.1)
SODIUM: 132 mmol/L — AB (ref 135–145)
Total Bilirubin: 1.5 mg/dL — ABNORMAL HIGH (ref 0.3–1.2)
Total Protein: 6 g/dL — ABNORMAL LOW (ref 6.5–8.1)

## 2017-02-26 LAB — MRSA PCR SCREENING: MRSA by PCR: NEGATIVE

## 2017-02-26 LAB — CBC
HCT: 24.3 % — ABNORMAL LOW (ref 39.0–52.0)
HEMOGLOBIN: 8.4 g/dL — AB (ref 13.0–17.0)
MCH: 32.9 pg (ref 26.0–34.0)
MCHC: 34.6 g/dL (ref 30.0–36.0)
MCV: 95.3 fL (ref 78.0–100.0)
PLATELETS: 180 10*3/uL (ref 150–400)
RBC: 2.55 MIL/uL — AB (ref 4.22–5.81)
RDW: 14.2 % (ref 11.5–15.5)
WBC: 10.1 10*3/uL (ref 4.0–10.5)

## 2017-02-26 MED ORDER — ACETAMINOPHEN 650 MG RE SUPP: 650.0000 mg | Freq: Four times a day (QID) | RECTAL | Status: DC | PRN

## 2017-02-26 MED ORDER — PANTOPRAZOLE SODIUM 40 MG PO TBEC
40.0000 mg | DELAYED_RELEASE_TABLET | Freq: Every day | ORAL | Status: DC
  Administered 2017-02-26 – 2017-03-01 (×4): 40 mg via ORAL
  Filled 2017-02-26 (×4): qty 1

## 2017-02-26 MED ORDER — LISINOPRIL 5 MG PO TABS
5.0000 mg | ORAL_TABLET | Freq: Every day | ORAL | Status: DC
  Administered 2017-02-26: 5 mg via ORAL
  Filled 2017-02-26: qty 1

## 2017-02-26 MED ORDER — NITROGLYCERIN IN D5W 200-5 MCG/ML-% IV SOLN
0.0000 ug/min | INTRAVENOUS | Status: DC
  Administered 2017-02-26: 60 ug/min via INTRAVENOUS
  Administered 2017-02-26: 85 ug/min via INTRAVENOUS
  Administered 2017-02-27: 55 ug/min via INTRAVENOUS
  Filled 2017-02-26 (×2): qty 250

## 2017-02-26 MED ORDER — CLONIDINE HCL 0.1 MG PO TABS
0.1000 mg | ORAL_TABLET | Freq: Two times a day (BID) | ORAL | Status: DC
  Administered 2017-02-26 – 2017-02-27 (×4): 0.1 mg via ORAL
  Filled 2017-02-26 (×4): qty 1

## 2017-02-26 MED ORDER — CHLORDIAZEPOXIDE HCL 25 MG PO CAPS
50.0000 mg | ORAL_CAPSULE | Freq: Once | ORAL | Status: AC
Start: 2017-02-26 — End: 2017-02-26
  Administered 2017-02-26: 50 mg via ORAL
  Filled 2017-02-26: qty 2

## 2017-02-26 MED ORDER — KETOROLAC TROMETHAMINE 30 MG/ML IJ SOLN
30.0000 mg | Freq: Once | INTRAMUSCULAR | Status: DC
  Filled 2017-02-26: qty 1

## 2017-02-26 MED ORDER — FAMOTIDINE 20 MG PO TABS
10.0000 mg | ORAL_TABLET | Freq: Every day | ORAL | Status: DC
  Administered 2017-02-26 – 2017-02-28 (×4): 10 mg via ORAL
  Filled 2017-02-26 (×4): qty 1

## 2017-02-26 MED ORDER — THIAMINE HCL 100 MG/ML IJ SOLN
100.0000 mg | Freq: Every day | INTRAMUSCULAR | Status: DC
  Administered 2017-02-26 – 2017-02-28 (×3): 100 mg via INTRAVENOUS
  Filled 2017-02-26 (×3): qty 2

## 2017-02-26 MED ORDER — CLONIDINE HCL 0.1 MG PO TABS: 0.1000 mg | ORAL_TABLET | Freq: Every day | ORAL | Status: DC

## 2017-02-26 MED ORDER — SALINE SPRAY 0.65 % NA SOLN
1.0000 | NASAL | Status: DC | PRN
  Administered 2017-02-27: 1 via NASAL
  Filled 2017-02-26 (×2): qty 44

## 2017-02-26 MED ORDER — ROSUVASTATIN CALCIUM 20 MG PO TABS
20.0000 mg | ORAL_TABLET | Freq: Every day | ORAL | Status: DC
  Administered 2017-02-26 – 2017-02-28 (×4): 20 mg via ORAL
  Filled 2017-02-26 (×4): qty 1

## 2017-02-26 MED ORDER — VITAMIN B-1 100 MG PO TABS
100.0000 mg | ORAL_TABLET | Freq: Every day | ORAL | Status: DC
  Administered 2017-02-26 – 2017-03-01 (×4): 100 mg via ORAL
  Filled 2017-02-26 (×4): qty 1

## 2017-02-26 MED ORDER — METOPROLOL TARTRATE 5 MG/5ML IV SOLN
5.0000 mg | Freq: Four times a day (QID) | INTRAVENOUS | Status: DC
  Administered 2017-02-26 (×2): 5 mg via INTRAVENOUS
  Filled 2017-02-26: qty 5

## 2017-02-26 MED ORDER — CARVEDILOL 25 MG PO TABS
25.0000 mg | ORAL_TABLET | Freq: Two times a day (BID) | ORAL | Status: DC
  Administered 2017-02-26 – 2017-03-01 (×7): 25 mg via ORAL
  Filled 2017-02-26 (×7): qty 1

## 2017-02-26 MED ORDER — ACETAMINOPHEN 325 MG PO TABS
650.0000 mg | ORAL_TABLET | Freq: Four times a day (QID) | ORAL | Status: DC | PRN
Start: 2017-02-26 — End: 2017-03-01
  Administered 2017-02-26 – 2017-02-27 (×3): 650 mg via ORAL
  Filled 2017-02-26 (×3): qty 2

## 2017-02-26 MED ORDER — LORAZEPAM 2 MG/ML IJ SOLN: 2.0000 mg | INTRAMUSCULAR | Status: DC | PRN

## 2017-02-26 NOTE — H&P (Addendum)
TRH H&P   Patient Demographics:    [name], is a 66 y.o. male  MRN: [id number]   DOB - [birth year]  Admit Date - 02/25/2017  Outpatient Primary MD for the patient is Alroy Dust, L.Marlou Sa, MD  Referring MD/NP/PA: Payton Emerald  Outpatient Specialists:  [name] (cardiology) Gilford Raid (CT surgery) Dr. Sammuel Hines (Vascular) in [facility]   Patient coming from: home  Chief Complaint  Patient presents with  . Epistaxis      HPI:    [name]  is a 66 y.o. male, w Copd, PE,  CVA, AAA, PAD s/p iliac stent, CAD s/p CABG apparently presents with c/o epistaxis since 10 am yesterday.  Pt was seen in the ED at [facility] and sent to [facility] for IR embolization which was deemed too risky by IR.  Pt continues to have bleeding even after packing and therefore ENT consulted who recommended transfusion of platelets ?per ED due to being on aspirin and plavix. Pt doesn't appear to have received Plt.   Pt appears to have lost a significant amount of blood via epistaxis and will be admitted observation . We hold off on plt transfusion since epistaxis improved  Of note, he also drinks alcohol on a daily basis >4 beers per day.  This may be contributing too his epistaxis.  Pt was noted to have high bp in ED,  Pt was treated with labetalol 10mv iv x1 and also clonidine 0.1mg  po x1 and started on nitro iv. For hypertensive urgency.       Review of systems:    In addition to the HPI above,  No Fever-chills, No Headache, No changes with Vision or hearing, No problems swallowing food or Liquids, No Chest pain, Cough or Shortness of Breath, No Abdominal pain, No Nausea or Vommitting, Bowel movements are regular, No Blood in stool or Urine, No dysuria, No new skin rashes or bruises, No new joints pains-aches,  No new weakness, tingling, numbness in any extremity, No recent weight gain  or loss, No polyuria, polydypsia or polyphagia, No significant Mental Stressors.  A full 10 point Review of Systems was done, except as stated above, all other Review of Systems were negative.   With Past History of the following :    Past Medical History:  Diagnosis Date  . AAA (abdominal aortic aneurysm) ([place])   . Anxiety   . CHF (congestive heart failure) ([place])   . COPD (chronic obstructive pulmonary disease) ([place])   . Coronary artery disease   . GERD (gastroesophageal reflux disease)   . History of blood transfusion   . Hyperlipidemia   . Hypertension    takes meds daily  . Hyponatremia   . Lumbar degenerative disc disease   . Myocardial infarction ([place])   . PAD (peripheral artery disease) ([place])   . PONV (postoperative nausea and vomiting)   .  Raynaud's disease /phenomenon   . Stroke [facility]) 10/15/2005      Past Surgical History:  Procedure Laterality Date  . ABDOMINAL ANGIOGRAM  03/30/2012  . ABDOMINAL AORTAGRAM N/A 03/30/2012   Procedure: ABDOMINAL Maxcine Ham;  Surgeon: Conrad Oliver, MD;  Location: [facility] CATH LAB;  Service: Cardiovascular;  Laterality: N/A;  . CARDIAC CATHETERIZATION  2006  . CARDIAC CATHETERIZATION N/A 12/09/2014   Procedure: Left Heart Cath and Coronary Angiography;  Surgeon: [name], MD;  Location: [place] CV LAB;  Service: Cardiovascular;  Laterality: N/A;  . CAROTID ENDARTERECTOMY     bilateral  . CAROTID ENDARTERECTOMY  04/13/2005   Right and 06/03/2005 Left  . CORONARY ARTERY BYPASS GRAFT N/A 12/13/2014   Procedure: CORONARY ARTERY BYPASS GRAFT times five using left internal mammary artery and endoscopic right leg saphenous vein harvest;  Surgeon: Gaye Pollack, MD;  Location: [facility] OR;  Service: Open Heart Surgery;  Laterality: N/A;  . HERNIA REPAIR    . ILIAC ARTERY STENT  10/15/2005    Left common and external   . INTRAOPERATIVE TRANSESOPHAGEAL ECHOCARDIOGRAM N/A 12/13/2014   Procedure: INTRAOPERATIVE TRANSESOPHAGEAL ECHOCARDIOGRAM;   Surgeon: Gaye Pollack, MD;  Location: [facility] OR;  Service: Open Heart Surgery;  Laterality: N/A;      Social History:     Social History  Substance Use Topics  . Smoking status: Former Smoker    Types: Cigarettes    Quit date: 05/24/2005  . Smokeless tobacco: Never Used  . Alcohol use 14.4 oz/week    24 Cans of beer per week     Comment: approximately 24 beer a week     Lives -  At home with wife.   Mobility -     Family History :     Family History  Problem Relation Age of Onset  . Coronary artery disease Father 59       HTN, Hyperlipidemia died  . Heart disease Father        Heart Disease before age 79  . Hypertension Father   . Hyperlipidemia Father   . Peripheral vascular disease Father   . Other Mother 40        died rheumatoid arthritis  . Dementia Mother   . Heart disease Brother   . Hypertension Brother   . Hyperlipidemia Brother       Home Medications:   Prior to Admission medications   Medication Sig Start Date End Date Taking? Authorizing Provider  Ascorbic Acid (VITAMIN C) 100 MG tablet Take 100 mg by mouth daily.   Yes [provider]  aspirin 81 MG tablet Take 81 mg by mouth every evening.    Yes [provider]  carvedilol (COREG) 25 MG tablet Take 1 tablet (25 mg total) by mouth 2 (two) times daily. 12/20/16  Yes Burtis Junes, NP  clobetasol ointment (TEMOVATE) 0.05 % apply to affected area twice a day if needed 09/24/15  Yes [provider]  cloNIDine (CATAPRES) 0.1 MG tablet Take 1 tablet (0.1 mg total) by mouth at bedtime. 02/18/16  Yes Burtis Junes, NP  clopidogrel (PLAVIX) 75 MG tablet take 1 tablet by mouth once daily 06/21/16  Yes Burtis Junes, NP  ferrous sulfate 325 (65 FE) MG EC tablet Take 325 mg by mouth daily with breakfast.   Yes [provider]  folic acid (FOLVITE) 1 MG tablet Take 1 tablet (1 mg total) by mouth daily. 01/29/15  Yes Burtis Junes, NP  furosemide (LASIX)  40 MG tablet Take  0.5  Tablet (20 mg) by mouth daily. May take and additional 0.5 tablet (20 mg) if needed for swelling.   Yes [provider]  levocetirizine (XYZAL) 5 MG tablet TAKE ONE TABLET ONCE DAILY AS DIRECTED 02/25/16  Yes Bobbitt, Sed[place] Muta, MD  lisinopril (PRINIVIL,ZESTRIL) 5 MG tablet take 1 tablet by mouth once daily 09/14/16  Yes [name], MD  NITROSTAT 0.4 MG SL tablet Place 1 tablet (0.4 mg total) under the tongue every 5 (five) minutes as needed. Chest pain 09/27/16  Yes Burtis Junes, NP  pantoprazole (PROTONIX) 40 MG tablet Take 40 mg by mouth daily. 01/11/17  Yes [provider]  ranitidine (ZANTAC) 300 MG tablet take 1 tablet by mouth at bedtime 03/22/16  Yes [name], MD  rosuvastatin (CRESTOR) 20 MG tablet Take 1 tablet (20 mg total) by mouth at bedtime. 12/20/16  Yes [name], MD  Thiamine HCl (VITAMIN B-1) 100 MG tablet Take 100 mg by mouth daily.     Yes [provider]  traMADol (ULTRAM) 50 MG tablet Take 1 tablet (50 mg total) by mouth every 6 (six) hours as needed. 01/13/15  Yes Burtis Junes, NP  triamcinolone ointment (KENALOG) 0.1 % Apply 1 application topically 2 (two) times daily. 08/05/15  Yes Bobbitt, [place] Muta, MD     Allergies:     Allergies  Allergen Reactions  . Hctz [Hydrochlorothiazide] Other (See Comments)    Black out due to electrolytes  . Norvasc [Amlodipine Besylate] Swelling       . Pletal [Cilostazol] Diarrhea  . Heparin Other (See Comments)    HIT positive as of 12/31/14     Physical Exam:   Vitals  Blood pressure (!) 145/94, pulse 98, resp. rate 17, height 5' 5.5" (1.664 m), weight 96.2 kg (212 lb), SpO2 96 %.   1. General  lying in bed in NAD,    2. Normal affect and insight, Not Suicidal or Homicidal, Awake Alert, Oriented X 3.  3. No F.N deficits, ALL C.Nerves Intact, Strength 5/5 all 4 extremities, Sensation intact all 4 extremities, Plantars down going.  4. Ears and Eyes appear Normal,  Conjunctivae clear, PERRLA. Moist Oral Mucosa.  5. Supple Neck, No JVD, No cervical lymphadenopathy appriciated, No Carotid Bruits.  6. Symmetrical Chest wall movement, Good air movement bilaterally, CTAB.  7. RRR, No Gallops, Rubs or Murmurs, No Parasternal Heave.  8. Positive Bowel Sounds, Abdomen Soft, No tenderness, No organomegaly appriciated,No rebound -guarding or rigidity.  9.  No Cyanosis, Normal Skin Turgor, No Skin Rash or Bruise.  10. Good muscle tone,  joints appear normal , no effusions, Normal ROM.  11. No Palpable Lymph Nodes in Neck or Axillae  + evidence of epistaxis on cloth on his abdomen.  Pt is currently with nasal packing.       Data Review:    CBC  Recent Labs Lab 02/25/17 1148 02/25/17 1935 02/25/17 2309  WBC 9.2  --  10.4  HGB 11.0* 9.5* 9.4*  HCT 31.0* 26.8* 27.4*  PLT 184  --  198  MCV 96.3  --  96.5  MCH 34.2*  --  33.1  MCHC 35.5  --  34.3  RDW 14.2  --  14.1   ------------------------------------------------------------------------------------------------------------------  Chemistries   Recent Labs Lab 02/25/17 1148  NA 129*  K 4.3  CL 93*  CO2 25  GLUCOSE 124*  BUN 21*  CREATININE 1.21  CALCIUM 9.4   ------------------------------------------------------------------------------------------------------------------ estimated  creatinine clearance is 62.9 mL/min (by C-G formula based on SCr of 1.21 mg/dL). ------------------------------------------------------------------------------------------------------------------ No results for input(s): TSH, T4TOTAL, T3FREE, THYROIDAB in the last 72 hours.  Invalid input(s): FREET3  Coagulation profile  Recent Labs Lab 02/25/17 1352  INR 0.98   ------------------------------------------------------------------------------------------------------------------- No results for input(s): DDIMER in the last 72  hours. -------------------------------------------------------------------------------------------------------------------  Cardiac Enzymes No results for input(s): CKMB, TROPONINI, MYOGLOBIN in the last 168 hours.  Invalid input(s): CK ------------------------------------------------------------------------------------------------------------------    Component Value Date/Time   BNP 605.7 (H) 05/31/2016 0950   BNP 568.1 (H) 12/27/2014 1028     ---------------------------------------------------------------------------------------------------------------  Urinalysis    Component Value Date/Time   COLORURINE STRAW (A) 02/25/2017 1432   APPEARANCEUR CLEAR 02/25/2017 1432   LABSPEC 1.006 02/25/2017 1432   PHURINE 5.0 02/25/2017 1432   GLUCOSEU NEGATIVE 02/25/2017 1432   HGBUR NEGATIVE 02/25/2017 1432   BILIRUBINUR NEGATIVE 02/25/2017 1432   KETONESUR NEGATIVE 02/25/2017 1432   PROTEINUR 30 (A) 02/25/2017 1432   UROBILINOGEN >8.0 (H) 01/02/2015 1321   NITRITE NEGATIVE 02/25/2017 1432   LEUKOCYTESUR NEGATIVE 02/25/2017 1432    ----------------------------------------------------------------------------------------------------------------   Imaging Results:    No results found.    Assessment & Plan:    Principal Problem:   Epistaxis Active Problems:   Hyperlipidemia with target LDL less than 70   Hyponatremia   CAD (coronary artery disease)   Anemia   Hypertensive urgency    Epistaxis Clear liquid diet ENT to evaluate, input much appreciated Cbc in am Transfuse if needed if Hgb <7  Hypertensive urgency Iv nitro Metoprolol 5mg  iv q6h   Hyponatremia ? Due to furosemide Hold Furosemide Repeat cmp in am  CHF (EF 60-65%) I and o, check daily weight Hold lasix temporarily  Anemia Repeat cbc in am  ETOH use Librium 50mg  po x1 CIWA  CAD s/p CABG Cont lisinopril , crestor HOLD Aspirin and Plavix due to epistaxis   DVT Prophylaxis  SCDs   AM Labs  Ordered, also please review Full Orders  Family Communication: Admission, patients condition and plan of care including tests being ordered have been discussed with the patient  who indicate understanding and agree with the plan and Code Status.  Code Status FULL CODE  Likely DC to  home  Condition GUARDED   Consults called: ENT/ Interventional radiology by ED.   Admission status: observation  Time spent in minutes : 45   Jani Gravel M.D on 02/26/2017 at 12:04 AM  Between 7am to 7pm - Pager - [phone]. After 7pm go to [email or web address] - password [email or web address]  [facility] - Office  [phone]

## 2017-02-26 NOTE — Progress Notes (Signed)
PROGRESS NOTE    CAMILA NORVILLE  WJX:[id number] DOB: 05-Apr-1951 DOA: 02/25/2017 PCP: Alroy Dust, L.Marlou Sa, MD   Brief Narrative: [name] is a 66 y.o. male, w Copd, PE,  CVA, AAA, PAD s/p iliac stent, CAD s/p CABG. He presented with epistaxis. He is on aspirin and plavix for his CAD. ENT consulted. Packing in place and epistaxis improved. Also has hypertensive urgency requiring nitro drip. Restarting home medications and adjusting to titrate down drip.   Assessment & Plan:   Principal Problem:   Epistaxis Active Problems:   Hyperlipidemia with target LDL less than 70   Hyponatremia   CAD (coronary artery disease)   Anemia   Hypertensive urgency   Epistaxis Controlled with packing. No recurrent bleeding -watch hemoglobin -ENT recommendations -IR recommendations: no intervention  Hypertensive urgency Improved. -continue nitro drip and titrate down as able -coreg, lisinopril -increase clonidine to 0.1 BID  Hyponatremia Improving. Asymptomatic.  Chronic diastolic heart failure Grade 2 diastolic dysfunction with an EF of 60-65% on 07/28/16.  -In/Out -daily weights   Anemia Normocytic. Trended down secondary to epistaxis. Chronic history of anemia and worsened secondary to acute blood loss. -repeat CBC in AM  Ethanol use -continue CIWA  CAD s/p CABG Aspirin and plavix on hold secondary to epistaxis. No recent stents -continue crestory   DVT prophylaxis: SCDs Code Status: Full code Family Communication: Wife at bedside Disposition Plan: Discharge in 24-48 hours pending blood pressure control   Consultants:   ENT  IR  Procedures:   Nasal packing (10/5)  Antimicrobials:  None    Subjective: No bleeding overnight.  Objective: Vitals:   02/26/17 0700 02/26/17 0723 02/26/17 0900 02/26/17 1100  BP: (!) 170/87 (!) 170/87 (!) 173/108 (!) 176/94  Pulse: 79 85 87   Resp: (!) 22 (!) 21 13   Temp:  98 F (36.7 C)    TempSrc:  Oral    SpO2: 97% 97%  98%   Weight:      Height:        Intake/Output Summary (Last 24 hours) at 02/26/17 1156 Last data filed at 02/26/17 1041  Gross per 24 hour  Intake           256.63 ml  Output              800 ml  Net          -543.37 ml   Filed Weights   02/25/17 1200 02/26/17 0147  Weight: 96.2 kg (212 lb) 89.7 kg (197 lb 12 oz)    Examination:  General exam: Appears calm and comfortable Psychiatry: Judgement and insight appear normal. Mood & affect appropriate.     Data Reviewed: I have personally reviewed following labs and imaging studies  CBC:  Recent Labs Lab 02/25/17 1148 02/25/17 1935 02/25/17 2309 02/26/17 0510  WBC 9.2  --  10.4 10.1  HGB 11.0* 9.5* 9.4* 8.4*  HCT 31.0* 26.8* 27.4* 24.3*  MCV 96.3  --  96.5 95.3  PLT 184  --  198 213   Basic Metabolic Panel:  Recent Labs Lab 02/25/17 1148 02/26/17 0510  NA 129* 132*  K 4.3 4.0  CL 93* 97*  CO2 25 24  GLUCOSE 124* 141*  BUN 21* 51*  CREATININE 1.21 1.09  CALCIUM 9.4 8.9   GFR: Estimated Creatinine Clearance: 69.1 mL/min (by C-G formula based on SCr of 1.09 mg/dL). Liver Function Tests:  Recent Labs Lab 02/26/17 0510  AST 19  ALT 13*  ALKPHOS 57  BILITOT 1.5*  PROT 6.0*  ALBUMIN 3.3*   No results for input(s): LIPASE, AMYLASE in the last 168 hours. No results for input(s): AMMONIA in the last 168 hours. Coagulation Profile:  Recent Labs Lab 02/25/17 1352  INR 0.98   Cardiac Enzymes: No results for input(s): CKTOTAL, CKMB, CKMBINDEX, TROPONINI in the last 168 hours. BNP (last 3 results) No results for input(s): PROBNP in the last 8760 hours. HbA1C: No results for input(s): HGBA1C in the last 72 hours. CBG:  Recent Labs Lab 02/25/17 1427  GLUCAP 126*   Lipid Profile: No results for input(s): CHOL, HDL, LDLCALC, TRIG, CHOLHDL, LDLDIRECT in the last 72 hours. Thyroid Function Tests: No results for input(s): TSH, T4TOTAL, FREET4, T3FREE, THYROIDAB in the last 72 hours. Anemia  Panel: No results for input(s): VITAMINB12, FOLATE, FERRITIN, TIBC, IRON, RETICCTPCT in the last 72 hours. Sepsis Labs: No results for input(s): PROCALCITON, LATICACIDVEN in the last 168 hours.  Recent Results (from the past 240 hour(s))  MRSA PCR Screening     Status: None   Collection Time: 02/26/17  1:48 AM  Result Value Ref Range Status   MRSA by PCR NEGATIVE NEGATIVE Final    Comment:        The GeneXpert MRSA Assay (FDA approved for NASAL specimens only), is one component of a comprehensive MRSA colonization surveillance program. It is not intended to diagnose MRSA infection nor to guide or monitor treatment for MRSA infections.          Radiology Studies: No results found.      Scheduled Meds: . carvedilol  25 mg Oral BID  . cloNIDine  0.1 mg Oral BID  . famotidine  10 mg Oral QHS  . lisinopril  5 mg Oral Daily  . pantoprazole  40 mg Oral Daily  . rosuvastatin  20 mg Oral QHS  . thiamine  100 mg Intravenous Daily  . thiamine  100 mg Oral Daily   Continuous Infusions: . nitroGLYCERIN 65 mcg/min (02/26/17 0957)     LOS: 0 days     Cordelia Poche, MD [facility] 02/26/2017, 11:56 AM Pager: [phone]  If 7PM-7AM, please contact night-coverage [email or web address] Password [email or web address] 02/26/2017, 11:56 AM

## 2017-02-26 NOTE — Progress Notes (Signed)
ENT Consult Progress Note  Subjective: Transferred to [facility] overnight. Neuro IR evaluated his case and did not feel that IR would be safe given his complex vasculopathy and multiple stenosed vessels. Discussed platelet transfusion with ER and observation since epistaxis slowed and then stopped overnight. Goal was to improve any coagulopathy in the setting of ASA, Plavis, and high EtOH consumption prior to admission.  Admitted to Hospitalist Service. No platelet transfusion given 2/2 resolution of epistaxis. Remains with L nasal pack in place.  BP still very elevated (170s). Holding ASA and Plavix.  Patient appears somewhat comfortable this morning. No current bleeding.    Objective: Vitals:   02/26/17 0700 02/26/17 0723  BP: (!) 170/87 (!) 170/87  Pulse: 79 85  Resp: (!) 22 (!) 21  Temp:  98 F (36.7 C)  SpO2: 97% 97%    Physical Exam: CONSTITUTIONAL: well developed, nourished, no distress and alert and oriented x 3 CARDIOVASCULAR: normal rate and regular rhythm PULMONARY/CHEST WALL: effort normal and no stridor, no stertor, no dysphonia HENT: Head : normocephalic and atraumatic Ears: Right ear:   canal normal, external ear normal and hearing normal Left ear:   canal normal, external ear normal and hearing normal Nose: no active epistaxis. Clamp from nose removed. Left 10cm nasal pope pack in place.  Mouth/Throat:  Mouth: uvula midline and no oral lesions Throat: oropharynx clear and moist. There is no blood in the posterior oropharynx.  Mucous membranes: normal EYES: conjunctiva normal, EOM normal and PERRL NECK: supple, trachea normal and no thyromegaly or cervical LAD   Data Review/Consults/Discussions: Most recent Hgb- 8.4 (somewhat dilutional effect) BP as high as 218 in last 24 hours CIWA protocol.  Assessment: [name] 66 y.o. male who presents with severe epistaxis in the setting of hypertensive urgency, ASA, and plavix. Left 10cm nasal pope pack placed.  Not currently bleeding. Do still recommend platelet transfusion to correct any underlying coagulopathy (that way if OR is necessary, patient will have clotting factors)   Plan:  -Continue to hold ASA and plavix -Tight BP control is essential, goal BP <250 systolic from an epistaxis standpoint -Platelet transfuse as able  -Reduce nasal manipulation - no nose picking, no tissues in the nose other than for dabbing. No nose blowing.   -Once one epistaxis episode occurs, it is common for some minor dripping to occur again over the next couple of days. If it does, use Afrin nasal spray for ACTIVE bleeding on the bleeding side and hold very firm anterior nasal pressure for 10-15 mins for ACTIVE bleeding. Put the bottle of Afrin parallel to the floor and pour medicine in nose, not spray. Recommend keeping a bottle of Afrin at the bedside just in case. -Please order nasal saline/Ocean spray. Use Ocean nasal spray q2 hr while awake to moisten the nasal mucosa to decrease nasal crusting and cracking. Apply nasal saline spray to the nasal pack to keep it moist. Use nasal saline spray for congestion instead of blowing the nose. After using the nasal saline spray, there may be some old blood that comes out with the irrigation; this is normal.  -Recommend staph coverage antibiotics while pack is in place. Pack will remain in place at least 72 hours.  -Will continue to follow   Thank you for involving [facility] in the care of this patient. Should you need further assistance, please call our office at [phone] or ENT on call.    Gavin Pound, MD

## 2017-02-26 NOTE — Progress Notes (Signed)
Patient ID: [name], male   DOB: 1951-03-16, 66 y.o.   MRN: [id number] Pt currently stable. No further active bleeding/epistaxis at this time. Blood pressure remains high at 173/108. Hemoglobin 8.4 (9.4); plans as outlined by ENT; IR available if necessary.

## 2017-02-27 DIAGNOSIS — E785 Hyperlipidemia, unspecified: Secondary | ICD-10-CM

## 2017-02-27 LAB — CBC
HCT: 20 % — ABNORMAL LOW (ref 39.0–52.0)
HEMATOCRIT: 20.3 % — AB (ref 39.0–52.0)
HEMOGLOBIN: 6.8 g/dL — AB (ref 13.0–17.0)
Hemoglobin: 7 g/dL — ABNORMAL LOW (ref 13.0–17.0)
MCH: 33.7 pg (ref 26.0–34.0)
MCH: 33.2 pg (ref 26.0–34.0)
MCHC: 34.5 g/dL (ref 30.0–36.0)
MCHC: 34 g/dL (ref 30.0–36.0)
MCV: 97.6 fL (ref 78.0–100.0)
MCV: 97.6 fL (ref 78.0–100.0)
Platelets: 149 10*3/uL — ABNORMAL LOW (ref 150–400)
Platelets: 148 10*3/uL — ABNORMAL LOW (ref 150–400)
RBC: 2.08 MIL/uL — ABNORMAL LOW (ref 4.22–5.81)
RBC: 2.05 MIL/uL — ABNORMAL LOW (ref 4.22–5.81)
RDW: 14.9 % (ref 11.5–15.5)
RDW: 14.8 % (ref 11.5–15.5)
WBC: 8.5 10*3/uL (ref 4.0–10.5)
WBC: 8.1 10*3/uL (ref 4.0–10.5)

## 2017-02-27 LAB — HEMOGLOBIN AND HEMATOCRIT, BLOOD
HCT: 24.6 % — ABNORMAL LOW (ref 39.0–52.0)
Hemoglobin: 8.4 g/dL — ABNORMAL LOW (ref 13.0–17.0)

## 2017-02-27 LAB — PREPARE RBC (CROSSMATCH)

## 2017-02-27 MED ORDER — AMOXICILLIN 500 MG PO CAPS
500.0000 mg | ORAL_CAPSULE | Freq: Three times a day (TID) | ORAL | Status: DC
  Administered 2017-02-27 – 2017-03-01 (×8): 500 mg via ORAL
  Filled 2017-02-27 (×11): qty 1

## 2017-02-27 MED ORDER — SODIUM CHLORIDE 0.9 % IV SOLN
Freq: Once | INTRAVENOUS | Status: AC
  Administered 2017-02-27: 1000 mL via INTRAVENOUS

## 2017-02-27 MED ORDER — LISINOPRIL 10 MG PO TABS
10.0000 mg | ORAL_TABLET | Freq: Every day | ORAL | Status: DC
  Administered 2017-02-27 – 2017-02-28 (×2): 10 mg via ORAL
  Filled 2017-02-27 (×2): qty 1

## 2017-02-27 MED ORDER — HYDRALAZINE HCL 20 MG/ML IJ SOLN
5.0000 mg | Freq: Once | INTRAMUSCULAR | Status: AC
  Administered 2017-02-27: 5 mg via INTRAVENOUS
  Filled 2017-02-27: qty 1

## 2017-02-27 NOTE — Progress Notes (Signed)
PROGRESS NOTE    [name]  ONG:[id number] DOB: [birth year] DOA: 02/25/2017 PCP: [name] Dust, L.Marlou Sa, MD   Brief Narrative: [name] is a 66 y.o. male, w Copd, PE,  CVA, AAA, PAD s/p iliac stent, CAD s/p CABG. He presented with epistaxis. He is on aspirin and plavix for his CAD. ENT consulted. Packing in place and epistaxis improved. Also has hypertensive urgency requiring nitro drip. Restarting home medications and adjusting to titrate down drip.   Assessment & Plan:   Principal Problem:   Epistaxis Active Problems:   Hyperlipidemia with target LDL less than 70   Hyponatremia   CAD (coronary artery disease)   Anemia   Hypertensive urgency   Epistaxis Controlled with packing. No recurrent bleeding. Hemoglobin trended down. -watch hemoglobin -ENT recommendations: remove packing in AM -IR recommendations: no intervention  Hypertensive urgency Improved. -discontinue nitro drip -coreg, increase to lisinopril 10mg  -continue clonidine to 0.1 BID  Hyponatremia Improving. Asymptomatic.  Chronic diastolic heart failure Grade 2 diastolic dysfunction with an EF of 60-65% on 07/28/16.  -In/Out -daily weights   Anemia Normocytic. Trended down secondary to epistaxis. Chronic history of anemia and worsened secondary to acute blood loss. Dropped below 8. Repeat CBC worse -repeat CBC in AM -2 units PRBC  Ethanol use -continue CIWA  CAD s/p CABG Aspirin and plavix on hold secondary to epistaxis. No recent stents -continue crestor   DVT prophylaxis: SCDs Code Status: Full code Family Communication: Wife at bedside Disposition Plan: Discharge in 24-48 hours pending blood pressure control and management of epistaxis   Consultants:   ENT  IR  Procedures:   Nasal packing (10/5)  Antimicrobials:  None    Subjective: No bleeding overnight.  Objective: Vitals:   02/27/17 0600 02/27/17 0800 02/27/17 0900 02/27/17 1154  BP: 137/65 132/77 (!) 163/95 130/61    Pulse: 77 78 82 67  Resp: 18 (!) 22 16 13   Temp:   98.1 F (36.7 C) 98.6 F (37 C)  TempSrc:   Oral Axillary  SpO2: 96% 97% 90% 99%  Weight:      Height:        Intake/Output Summary (Last 24 hours) at 02/27/17 1225 Last data filed at 02/27/17 1157  Gross per 24 hour  Intake           784.61 ml  Output             1575 ml  Net          -790.39 ml   Filed Weights   02/25/17 1200 02/26/17 0147 02/27/17 0346  Weight: 96.2 kg (212 lb) 89.7 kg (197 lb 12 oz) 90.4 kg (199 lb 4.7 oz)    Examination:  General exam: Appears calm and comfortable Respiratory: Clear to auscultation bilaterally. Unlabored work of breathing. No wheezing or rales. Cardiovascular: Regular rate and rhythm. Normal S1 and S2. No heart murmurs present. No extra heart sounds Gastroenterology: Soft, non-tender, non-distended, no guarding, no rebound, no masses felt Psychiatry: Judgement and insight appear normal. Mood & affect appropriate.     Data Reviewed: I have personally reviewed following labs and imaging studies  CBC:  Recent Labs Lab 02/25/17 1148 02/25/17 1935 02/25/17 2309 02/26/17 0510 02/27/17 0755 02/27/17 1107  WBC 9.2  --  10.4 10.1 8.5 8.1  HGB 11.0* 9.5* 9.4* 8.4* 7.0* 6.8*  HCT 31.0* 26.8* 27.4* 24.3* 20.3* 20.0*  MCV 96.3  --  96.5 95.3 97.6 97.6  PLT 184  --  198 180 149* 148*  Basic Metabolic Panel:  Recent Labs Lab 02/25/17 1148 02/26/17 0510  NA 129* 132*  K 4.3 4.0  CL 93* 97*  CO2 25 24  GLUCOSE 124* 141*  BUN 21* 51*  CREATININE 1.21 1.09  CALCIUM 9.4 8.9   GFR: Estimated Creatinine Clearance: 74.6 mL/min (by C-G formula based on SCr of 1.09 mg/dL). Liver Function Tests:  Recent Labs Lab 02/26/17 0510  AST 19  ALT 13*  ALKPHOS 57  BILITOT 1.5*  PROT 6.0*  ALBUMIN 3.3*   No results for input(s): LIPASE, AMYLASE in the last 168 hours. No results for input(s): AMMONIA in the last 168 hours. Coagulation Profile:  Recent Labs Lab 02/25/17 1352   INR 0.98   Cardiac Enzymes: No results for input(s): CKTOTAL, CKMB, CKMBINDEX, TROPONINI in the last 168 hours. BNP (last 3 results) No results for input(s): PROBNP in the last 8760 hours. HbA1C: No results for input(s): HGBA1C in the last 72 hours. CBG:  Recent Labs Lab 02/25/17 1427  GLUCAP 126*   Lipid Profile: No results for input(s): CHOL, HDL, LDLCALC, TRIG, CHOLHDL, LDLDIRECT in the last 72 hours. Thyroid Function Tests: No results for input(s): TSH, T4TOTAL, FREET4, T3FREE, THYROIDAB in the last 72 hours. Anemia Panel: No results for input(s): VITAMINB12, FOLATE, FERRITIN, TIBC, IRON, RETICCTPCT in the last 72 hours. Sepsis Labs: No results for input(s): PROCALCITON, LATICACIDVEN in the last 168 hours.  Recent Results (from the past 240 hour(s))  MRSA PCR Screening     Status: None   Collection Time: 02/26/17  1:48 AM  Result Value Ref Range Status   MRSA by PCR NEGATIVE NEGATIVE Final    Comment:        The GeneXpert MRSA Assay (FDA approved for NASAL specimens only), is one component of a comprehensive MRSA colonization surveillance program. It is not intended to diagnose MRSA infection nor to guide or monitor treatment for MRSA infections.          Radiology Studies: No results found.      Scheduled Meds: . amoxicillin  500 mg Oral Q8H  . carvedilol  25 mg Oral BID  . cloNIDine  0.1 mg Oral BID  . famotidine  10 mg Oral QHS  . ketorolac  30 mg Intravenous Once  . lisinopril  10 mg Oral Daily  . pantoprazole  40 mg Oral Daily  . rosuvastatin  20 mg Oral QHS  . thiamine  100 mg Intravenous Daily  . thiamine  100 mg Oral Daily   Continuous Infusions: . sodium chloride       LOS: 1 day     Cordelia Poche, MD [facility] 02/27/2017, 12:25 PM Pager: [phone]  If 7PM-7AM, please contact night-coverage [email or web address] Password [email or web address] 02/27/2017, 12:25 PM

## 2017-02-27 NOTE — Progress Notes (Signed)
CRITICAL VALUE ALERT  Critical Value:  Hemoglobin 6.8  Date & Time Notied:  02/27/17 1218  Provider Notified: Lonny Prude  Orders Received/Actions taken: 8453 order to transfuse 2 units of PRBCs in place. Obtained blood admin consent and administered to patient

## 2017-02-27 NOTE — Progress Notes (Signed)
ENT Consult Progress Note  Subjective: BP still very elevated though improving (160's). Holding ASA and Plavix.  Patient appears more comfortable this morning. No bleeding overnight, no bleeding since Friday evening per patient and his wife. RN confirms.    Objective: Vitals:   02/27/17 0800 02/27/17 0900  BP: 132/77 (!) 163/95  Pulse: 78 82  Resp: (!) 22 16  Temp:  98.1 F (36.7 C)  SpO2: 97% 90%    Physical Exam: CONSTITUTIONAL: well developed, nourished, no distress and alert and oriented x 3 CARDIOVASCULAR: normal rate and regular rhythm PULMONARY/CHEST WALL: effort normal and no stridor, no stertor, no dysphonia HENT: Head : normocephalic and atraumatic Ears: Right ear:   canal normal, external ear normal and hearing normal Left ear:   canal normal, external ear normal and hearing normal Nose: no active epistaxis.  Left 10cm nasal pope pack in place and is completely dry.  Mouth/Throat:  Mouth: uvula midline and no oral lesions Throat: oropharynx clear and moist. No blood in the posterior oropharynx.  Mucous membranes: normal EYES: conjunctiva normal, EOM normal and PERRL NECK: supple, trachea normal and no thyromegaly or cervical LAD   Data Review/Consults/Discussions: Most recent Hgb- 7.0 (somewhat dilutional effect compared to yesterday's labs again) BP as high as 163 in last 24 hours CIWA protocol. Discussed case with Dr. Lonny Prude  Assessment: [name] 66 y.o. male who presents with severe epistaxis in the setting of hypertensive urgency, ASA, and plavix. Left 10cm nasal pope pack placed. Not currently bleeding. NO epistaxis in over 36 hours.    Plan: -Continue to hold ASA and plavix -Tight BP control is essential, goal BP <503 systolic from an epistaxis standpoint -Reduce nasal manipulation - no nose picking, no tissues in the nose other than for dabbing. No nose blowing.   -Once one epistaxis episode occurs, it is common for some minor dripping to occur  again over the next couple of days. If it does, use Afrin nasal spray for ACTIVE bleeding on the bleeding side and hold very firm anterior nasal pressure for 10-15 mins for ACTIVE bleeding. Put the bottle of Afrin parallel to the floor and pour medicine in nose, not spray. Recommend keeping a bottle of Afrin at the bedside just in case. -Please order nasal saline/Ocean spray. Use Ocean nasal spray q2 hr while awake to moisten the nasal mucosa to decrease nasal crusting and cracking. Apply nasal saline spray to the nasal pack to keep it moist. Use nasal saline spray for congestion instead of blowing the nose. After using the nasal saline spray, there may be some old blood that comes out with the irrigation; this is normal.  -Recommend staph coverage antibiotics while pack is in place.  -Nasal packing will be removed tomorrow by Dr. Redmond Baseman -Will continue to follow -Primary team to consider transfusion vs repeat CBC given Hgb <8 in patient with significant cardiac history.    Thank you for involving [facility] in the care of this patient. Should you need further assistance, please call our office at [phone] or ENT on call.    Gavin Pound, MD

## 2017-02-28 ENCOUNTER — Telehealth: Payer: Self-pay | Admitting: Nurse Practitioner

## 2017-02-28 LAB — BASIC METABOLIC PANEL
Anion gap: 10 (ref 5–15)
BUN: 21 mg/dL — AB (ref 6–20)
CHLORIDE: 103 mmol/L (ref 101–111)
CO2: 24 mmol/L (ref 22–32)
Calcium: 8.7 mg/dL — ABNORMAL LOW (ref 8.9–10.3)
Creatinine, Ser: 1.16 mg/dL (ref 0.61–1.24)
GFR calc Af Amer: >60 mL/min (ref >60)
GFR calc non Af Amer: >60 mL/min (ref >60)
GLUCOSE: 100 mg/dL — AB (ref 65–99)
POTASSIUM: 3.1 mmol/L — AB (ref 3.5–5.1)
Sodium: 137 mmol/L (ref 135–145)

## 2017-02-28 LAB — CBC
HEMATOCRIT: 25.2 % — AB (ref 39.0–52.0)
Hemoglobin: 8.6 g/dL — ABNORMAL LOW (ref 13.0–17.0)
MCH: 31.7 pg (ref 26.0–34.0)
MCHC: 34.1 g/dL (ref 30.0–36.0)
MCV: 93 fL (ref 78.0–100.0)
Platelets: 126 10*3/uL — ABNORMAL LOW (ref 150–400)
RBC: 2.71 MIL/uL — ABNORMAL LOW (ref 4.22–5.81)
RDW: 17.6 % — AB (ref 11.5–15.5)
WBC: 7.1 10*3/uL (ref 4.0–10.5)

## 2017-02-28 LAB — TYPE AND SCREEN
ABO/RH(D): B POS
ANTIBODY SCREEN: NEGATIVE
UNIT DIVISION: 0
Unit division: 0

## 2017-02-28 LAB — BPAM RBC
Blood Product Expiration Date: 201810272359
Blood Product Expiration Date: 201810272359
ISSUE DATE / TIME: 201810071250
ISSUE DATE / TIME: 201810071613
Unit Type and Rh: 7300
Unit Type and Rh: 7300

## 2017-02-28 MED ORDER — CLONIDINE HCL 0.2 MG PO TABS
0.2000 mg | ORAL_TABLET | Freq: Two times a day (BID) | ORAL | Status: DC
  Administered 2017-02-28 – 2017-03-01 (×3): 0.2 mg via ORAL
  Filled 2017-02-28 (×3): qty 1

## 2017-02-28 MED ORDER — HYDRALAZINE HCL 20 MG/ML IJ SOLN: 10.0000 mg | Freq: Four times a day (QID) | INTRAMUSCULAR | Status: DC | PRN

## 2017-02-28 MED ORDER — POTASSIUM CHLORIDE CRYS ER 20 MEQ PO TBCR
40.0000 meq | EXTENDED_RELEASE_TABLET | Freq: Once | ORAL | Status: AC
  Administered 2017-02-28: 40 meq via ORAL
  Filled 2017-02-28: qty 2

## 2017-02-28 MED ORDER — HYDRALAZINE HCL 20 MG/ML IJ SOLN
10.0000 mg | Freq: Once | INTRAMUSCULAR | Status: AC
  Administered 2017-02-28: 10 mg via INTRAVENOUS
  Filled 2017-02-28: qty 1

## 2017-02-28 NOTE — Telephone Encounter (Signed)
New message    Pt wife is calling asking for a call back. Please call back.

## 2017-02-28 NOTE — Progress Notes (Signed)
   Subjective:    Patient ID: [name], male    DOB: 12-31-1950, 66 y.o.   MRN: [id number]  HPI Has had no bleeding since Saturday morning.  Still treating hypertension.  Took Plavix last on Friday.  Review of Systems     Objective:   Physical Exam AF VSS except BP elevated Alert, NAD Left nasal pack in place, no active bleeding    Assessment & Plan:  Left epistaxis, antiplatelet therapy  He has had a pack in place with no significant bleeding since Saturday morning, less than three days.  He has held Plavix since Friday morning.  Blood pressure remains significantly elevated.  For these reasons, and being that he does not have a good alternative intervention option, I will wait to consider pack removal tomorrow.

## 2017-02-28 NOTE — Progress Notes (Signed)
PROGRESS NOTE    KUE FOX  KWI:[id number] DOB: 1950-12-27 DOA: 02/25/2017 PCP: Alroy Dust, L.Marlou Sa, MD   Brief Narrative: [name] is a 66 y.o. male, w Copd, PE,  CVA, AAA, PAD s/p iliac stent, CAD s/p CABG. He presented with epistaxis. He is on aspirin and plavix for his CAD. ENT consulted. Packing in place and epistaxis improved. Also has hypertensive urgency requiring nitro drip. Restarting home medications and adjusting to titrate down drip.   Assessment & Plan:   Principal Problem:   Epistaxis Active Problems:   Hyperlipidemia with target LDL less than 70   Hyponatremia   CAD (coronary artery disease)   Anemia   Hypertensive urgency   Epistaxis Controlled with packing. No recurrent bleeding. Hemoglobin trended down. -watch hemoglobin -ENT recommendations: remove packing today -IR recommendations: no intervention   Hypertensive urgency Stable with mild episodes of uncontrolled blood pressure -coreg, increase to lisinopril 10mg  -increase to clonidine to 0.2 BID  Hyponatremia Improving. Asymptomatic.  Chronic diastolic heart failure Grade 2 diastolic dysfunction with an EF of 60-65% on 07/28/16.  -In/Out -daily weights   Anemia Normocytic. Trended down secondary to epistaxis. Chronic history of anemia and worsened secondary to acute blood loss. Dropped below 8. Repeat CBC worse. S/p 2 units PRBC on 02/27/17. Improved to hemoglobin of 8.6.  Ethanol use -continue CIWA  CAD s/p CABG Aspirin and plavix on hold secondary to epistaxis. No recent stents -continue crestor   DVT prophylaxis: SCDs Code Status: Full code Family Communication: Wife at bedside Disposition Plan: Discharge in 24 hours pending blood pressure control and management of epistaxis   Consultants:   ENT  IR  Procedures:   Nasal packing (10/5)  Antimicrobials:  None    Subjective: No bleeding.  Objective: Vitals:   02/28/17 0100 02/28/17 0200 02/28/17 0500 02/28/17  0744  BP: (!) 182/73 (!) 126/95 (!) 165/69 (!) 144/97  Pulse: 69 74 73   Resp: 17 16 17    Temp:   97.9 F (36.6 C) 98.1 F (36.7 C)  TempSrc:   Oral Oral  SpO2: 99% 97% 97%   Weight:   89.5 kg (197 lb 5 oz)   Height:        Intake/Output Summary (Last 24 hours) at 02/28/17 1106 Last data filed at 02/28/17 0900  Gross per 24 hour  Intake             1425 ml  Output              800 ml  Net              625 ml   Filed Weights   02/26/17 0147 02/27/17 0346 02/28/17 0500  Weight: 89.7 kg (197 lb 12 oz) 90.4 kg (199 lb 4.7 oz) 89.5 kg (197 lb 5 oz)    Examination:  General exam: Appears calm and comfortable Respiratory: Clear to auscultation bilaterally. Unlabored work of breathing. No wheezing or rales. Cardiovascular: Regular rate and rhythm. Normal S1 and S2. No heart murmurs present. No extra heart sounds Gastroenterology: Soft, non-tender, non-distended, no guarding, no rebound, no masses felt Psychiatry: Judgement and insight appear normal. Mood & affect appropriate.     Data Reviewed: I have personally reviewed following labs and imaging studies  CBC:  Recent Labs Lab 02/25/17 2309 02/26/17 0510 02/27/17 0755 02/27/17 1107 02/27/17 2225 02/28/17 0346  WBC 10.4 10.1 8.5 8.1  --  7.1  HGB 9.4* 8.4* 7.0* 6.8* 8.4* 8.6*  HCT 27.4* 24.3* 20.3*  20.0* 24.6* 25.2*  MCV 96.5 95.3 97.6 97.6  --  93.0  PLT 198 180 149* 148*  --  740*   Basic Metabolic Panel:  Recent Labs Lab 02/25/17 1148 02/26/17 0510 02/28/17 0346  NA 129* 132* 137  K 4.3 4.0 3.1*  CL 93* 97* 103  CO2 25 24 24   GLUCOSE 124* 141* 100*  BUN 21* 51* 21*  CREATININE 1.21 1.09 1.16  CALCIUM 9.4 8.9 8.7*   GFR: Estimated Creatinine Clearance: 64.9 mL/min (by C-G formula based on SCr of 1.16 mg/dL). Liver Function Tests:  Recent Labs Lab 02/26/17 0510  AST 19  ALT 13*  ALKPHOS 57  BILITOT 1.5*  PROT 6.0*  ALBUMIN 3.3*   No results for input(s): LIPASE, AMYLASE in the last 168  hours. No results for input(s): AMMONIA in the last 168 hours. Coagulation Profile:  Recent Labs Lab 02/25/17 1352  INR 0.98   Cardiac Enzymes: No results for input(s): CKTOTAL, CKMB, CKMBINDEX, TROPONINI in the last 168 hours. BNP (last 3 results) No results for input(s): PROBNP in the last 8760 hours. HbA1C: No results for input(s): HGBA1C in the last 72 hours. CBG:  Recent Labs Lab 02/25/17 1427  GLUCAP 126*   Lipid Profile: No results for input(s): CHOL, HDL, LDLCALC, TRIG, CHOLHDL, LDLDIRECT in the last 72 hours. Thyroid Function Tests: No results for input(s): TSH, T4TOTAL, FREET4, T3FREE, THYROIDAB in the last 72 hours. Anemia Panel: No results for input(s): VITAMINB12, FOLATE, FERRITIN, TIBC, IRON, RETICCTPCT in the last 72 hours. Sepsis Labs: No results for input(s): PROCALCITON, LATICACIDVEN in the last 168 hours.  Recent Results (from the past 240 hour(s))  MRSA PCR Screening     Status: None   Collection Time: 02/26/17  1:48 AM  Result Value Ref Range Status   MRSA by PCR NEGATIVE NEGATIVE Final    Comment:        The GeneXpert MRSA Assay (FDA approved for NASAL specimens only), is one component of a comprehensive MRSA colonization surveillance program. It is not intended to diagnose MRSA infection nor to guide or monitor treatment for MRSA infections.          Radiology Studies: No results found.      Scheduled Meds: . amoxicillin  500 mg Oral Q8H  . carvedilol  25 mg Oral BID  . cloNIDine  0.2 mg Oral BID  . famotidine  10 mg Oral QHS  . ketorolac  30 mg Intravenous Once  . lisinopril  10 mg Oral Daily  . pantoprazole  40 mg Oral Daily  . rosuvastatin  20 mg Oral QHS  . thiamine  100 mg Intravenous Daily  . thiamine  100 mg Oral Daily   Continuous Infusions:    LOS: 2 days     Cordelia Poche, MD [facility] 02/28/2017, 11:06 AM Pager: [phone]  If 7PM-7AM, please contact night-coverage [email or web address] Password  [email or web address] 02/28/2017, 11:06 AM

## 2017-02-28 NOTE — Care Management Note (Signed)
Case Management Note  Patient Details  Name: [name] MRN: [id number] Date of Birth: 1950/11/18  Subjective/Objective:   From home with wife, who is able to assist him , pta indep, he has a rolling walker and a cane but does not use it.  Presents with epistaxis. He is on aspirin and plavix for his CAD. ENT consulted. Packing in place and epistaxis improved. Also has hypertensive urgency requiring nitro drip.  He has a PCP and medication coverage.                 Action/Plan: NCM will follow for dc needs.   Expected Discharge Date:  02/28/17               Expected Discharge Plan:  Home/Self Care  In-House Referral:     Discharge planning Services  CM Consult  Post Acute Care Choice:    Choice offered to:     DME Arranged:    DME Agency:     HH Arranged:    HH Agency:     Status of Service:  In process, will continue to follow  If discussed at Long Length of Stay Meetings, dates discussed:    Additional Comments:  Zenon Mayo, RN 02/28/2017, 11:32 AM

## 2017-03-01 MED ORDER — SALINE SPRAY 0.65 % NA SOLN
2.0000 | NASAL | Status: DC
  Administered 2017-03-01 (×2): 2 via NASAL
  Filled 2017-03-01: qty 44

## 2017-03-01 MED ORDER — LISINOPRIL 20 MG PO TABS
20.0000 mg | ORAL_TABLET | Freq: Every day | ORAL | Status: DC
  Administered 2017-03-01: 20 mg via ORAL
  Filled 2017-03-01 (×2): qty 1

## 2017-03-01 MED ORDER — CLONIDINE HCL 0.2 MG PO TABS: 0.2000 mg | ORAL_TABLET | Freq: Two times a day (BID) | ORAL | 0 refills | Status: DC

## 2017-03-01 MED ORDER — HYDRALAZINE HCL 20 MG/ML IJ SOLN: 10.0000 mg | Freq: Four times a day (QID) | INTRAMUSCULAR | Status: DC | PRN

## 2017-03-01 MED ORDER — TRAMADOL HCL 50 MG PO TABS
50.0000 mg | ORAL_TABLET | Freq: Four times a day (QID) | ORAL | Status: DC | PRN
  Administered 2017-03-01: 50 mg via ORAL
  Filled 2017-03-01: qty 1

## 2017-03-01 MED ORDER — ORAL CARE MOUTH RINSE: 15.0000 mL | Freq: Two times a day (BID) | OROMUCOSAL | Status: DC

## 2017-03-01 MED ORDER — CHLORHEXIDINE GLUCONATE 0.12 % MT SOLN
15.0000 mL | Freq: Two times a day (BID) | OROMUCOSAL | Status: DC
  Administered 2017-03-01: 15 mL via OROMUCOSAL
  Filled 2017-03-01: qty 15

## 2017-03-01 MED ORDER — POTASSIUM CHLORIDE CRYS ER 20 MEQ PO TBCR
40.0000 meq | EXTENDED_RELEASE_TABLET | Freq: Once | ORAL | Status: AC
  Administered 2017-03-01: 40 meq via ORAL
  Filled 2017-03-01: qty 2

## 2017-03-01 MED ORDER — LISINOPRIL 20 MG PO TABS: 20.0000 mg | ORAL_TABLET | Freq: Every day | ORAL | 0 refills | Status: DC

## 2017-03-01 NOTE — Care Management Note (Signed)
Case Management Note  Patient Details  Name: [name] MRN: [id number] Date of Birth: 1951-01-21  Subjective/Objective:      Admitted with Epistaxis.             PCP:  Donnie Coffin  Action/Plan: Plan is to d/c to home when medically stable.   Expected Discharge Date:  02/28/17               Expected Discharge Plan:  [place]  In-House Referral:     Discharge planning Services  CM Consult  Post Acute Care Choice:    Choice offered to:  Spouse, Patient  DME Arranged:    DME Agency:     HH Arranged:  PT [place]:  [place], referral made with Jermaine @ [phone]  Status of Service:  completed  If discussed at Long Length of Stay Meetings, dates discussed:    Additional Comments:  Sharin Mons, RN 03/01/2017, 12:21 PM

## 2017-03-01 NOTE — Discharge Summary (Signed)
Physician Discharge Summary  [name] KGY:[id number] DOB: [birth year] DOA: 02/25/2017  PCP: Alroy Dust, L.Marlou Sa, MD  Admit date: 02/25/2017 Discharge date: 03/01/2017  Admitted From: Home Disposition: Home  Recommendations for Outpatient Follow-up:  1. Follow up with PCP in 1 week 2. Follow up with cardiology with regard to antiplatelet therapy and continued blood pressure management 3. Please obtain BMP/CBC in one week 4. Please follow up on the following pending results: None  Home Health: PT/OT Equipment/Devices: None  Discharge Condition: Stable CODE STATUS: Full code Diet recommendation: Heart healthy   Brief/Interim Summary:  Admission HPI written by Jani Gravel, MD    HPI:   [name]  is a 66 y.o. male, w Copd, PE,  CVA, AAA, PAD s/p iliac stent, CAD s/p CABG apparently presents with c/o epistaxis since 10 am yesterday.  Pt was seen in the ED at [facility] and sent to [facility] for IR embolization which was deemed too risky by IR.  Pt continues to have bleeding even after packing and therefore ENT consulted who recommended transfusion of platelets ?per ED due to being on aspirin and plavix. Pt doesn't appear to have received Plt.   Pt appears to have lost a significant amount of blood via epistaxis and will be admitted observation . We hold off on plt transfusion since epistaxis improved  Of note, he also drinks alcohol on a daily basis >4 beers per day.  This may be contributing too his epistaxis.  Pt was noted to have high bp in ED,  Pt was treated with labetalol 61mv iv x1 and also clonidine 0.1mg  po x1 and started on nitro iv. For hypertensive urgency.    Hospital course:  Epistaxis Controlled with packing. No recurrent bleeding. Hemoglobin trended down and patient required transfusion of 2 units of PRBC. Packing removed on 10/9 without issue. Outpatient ENT follow-up in 2 weeks.   Hypertensive urgency Initially treated with nitroglycerin drip. Home  medications given and titrated. Discharging on Coreg 25mg  BID, lisinopril 20mg  daily and clonidine 0.2mg  BID. Will need repeat BMP since lisinopril dose adjusted. Orthostatic vital signs negative prior to discharge.  Hyponatremia Resolved.  Chronic diastolic heart failure Grade 2 diastolic dysfunction with an EF of 60-65% on 07/28/16.  Euvolemic.  Anemia Normocytic. Trended down secondary to epistaxis. Chronic history of anemia and worsened secondary to acute blood loss. Dropped below 8. Repeat CBC worse. S/p 2 units PRBC on 02/27/17. Improved to hemoglobin of 8.6. Repeat CBC as an outpatient.  Ethanol use CIWA while inpatient.  CAD s/p CABG Aspirin and plavix on hold secondary to epistaxis. No recent stents. Continued Crestor. Cardiology recommended to hold aspirin and Plavix until outpatient follow-up.    Discharge Diagnoses:  Principal Problem:   Epistaxis Active Problems:   Hyperlipidemia with target LDL less than 70   Hyponatremia   CAD (coronary artery disease)   Anemia   Hypertensive urgency    Discharge Instructions  Discharge Instructions    Call MD for:  difficulty breathing, headache or visual disturbances    Complete by:  As directed    Call MD for:  extreme fatigue    Complete by:  As directed    Call MD for:  persistant dizziness or light-headedness    Complete by:  As directed    Diet - low sodium heart healthy    Complete by:  As directed    Increase activity slowly    Complete by:  As directed      Allergies  as of 03/01/2017      Reactions   Hctz [hydrochlorothiazide] Other (See Comments)   Black out due to electrolytes   Norvasc [amlodipine Besylate] Swelling      Pletal [cilostazol] Diarrhea   Heparin Other (See Comments)   HIT positive as of 12/31/14      Medication List    STOP taking these medications   aspirin 81 MG tablet   clopidogrel 75 MG tablet Commonly known as:  PLAVIX     TAKE these medications   carvedilol 25 MG  tablet Commonly known as:  COREG Take 1 tablet (25 mg total) by mouth 2 (two) times daily.   clobetasol ointment 0.05 % Commonly known as:  TEMOVATE apply to affected area twice a day if needed   cloNIDine 0.2 MG tablet Commonly known as:  CATAPRES Take 1 tablet (0.2 mg total) by mouth 2 (two) times daily. What changed:  medication strength  how much to take  when to take this   ferrous sulfate 325 (65 FE) MG EC tablet Take 325 mg by mouth daily with breakfast.   folic acid 1 MG tablet Commonly known as:  FOLVITE Take 1 tablet (1 mg total) by mouth daily.   furosemide 40 MG tablet Commonly known as:  LASIX Take 0.5  Tablet (20 mg) by mouth daily. May take and additional 0.5 tablet (20 mg) if needed for swelling.   levocetirizine 5 MG tablet Commonly known as:  XYZAL TAKE ONE TABLET ONCE DAILY AS DIRECTED   lisinopril 20 MG tablet Commonly known as:  PRINIVIL,ZESTRIL Take 1 tablet (20 mg total) by mouth daily. What changed:  medication strength  See the new instructions.   NITROSTAT 0.4 MG SL tablet Generic drug:  nitroGLYCERIN Place 1 tablet (0.4 mg total) under the tongue every 5 (five) minutes as needed. Chest pain   pantoprazole 40 MG tablet Commonly known as:  PROTONIX Take 40 mg by mouth daily.   ranitidine 300 MG tablet Commonly known as:  ZANTAC take 1 tablet by mouth at bedtime   rosuvastatin 20 MG tablet Commonly known as:  CRESTOR Take 1 tablet (20 mg total) by mouth at bedtime.   thiamine 100 MG tablet Commonly known as:  VITAMIN B-1 Take 100 mg by mouth daily.   traMADol 50 MG tablet Commonly known as:  ULTRAM Take 1 tablet (50 mg total) by mouth every 6 (six) hours as needed.   triamcinolone ointment 0.1 % Commonly known as:  KENALOG Apply 1 application topically 2 (two) times daily.   vitamin C 100 MG tablet Take 100 mg by mouth daily.      Follow-up Information    Helayne Seminole, MD. Schedule an appointment as soon as  possible for a visit in 2 week(s).   Specialty:  Otolaryngology Contact information: [place] [address] [phone]        Rosilyn Mings, PA-C Follow up.   Specialty:  Physician Assistant Contact information: [place] [address] [place], [place] Follow up.   Why:  Home health services arranged, office will call and set up home visits Contact information: [address] [place] [address] [place], L.Marlou Sa, MD. Schedule an appointment as soon as possible for a visit in 1 week(s).   Specialty:  Family Medicine Contact information: [address] [place]  [place] [address] [phone]          Allergies  Allergen Reactions  . Hctz [Hydrochlorothiazide] Other (See Comments)    Black out due to electrolytes  . Norvasc [Amlodipine Besylate] Swelling       . Pletal [Cilostazol] Diarrhea  . Heparin Other (See Comments)    HIT positive as of 12/31/14    Consultations:  Otolaryngology  Interventional radiology  Cardiology   Procedures/Studies: No results found.   Subjective: No chest pain or dyspnea. No bleeding.  Discharge Exam: Vitals:   03/01/17 1540 03/01/17 1637  BP: (!) 163/70 124/70  Pulse: 60 78  Resp: 18 17  Temp: 98.3 F (36.8 C) 98.2 F (36.8 C)  SpO2: 98% 97%   Vitals:   03/01/17 0500 03/01/17 0827 03/01/17 1540 03/01/17 1637  BP:  137/68 (!) 163/70 124/70  Pulse:  63 60 78  Resp:  18 18 17   Temp:  98.6 F (37 C) 98.3 F (36.8 C) 98.2 F (36.8 C)  TempSrc:  Oral Oral Oral  SpO2:  99% 98% 97%  Weight: 89.5 kg (197 lb 5 oz)     Height:        General: Pt is alert, awake, not in acute distress Cardiovascular: RRR, S1/S2 +, no rubs, no gallops Respiratory: CTA bilaterally, no wheezing, no rhonchi Abdominal: Soft, NT, ND, bowel sounds + Extremities: no edema, no cyanosis    The results  of significant diagnostics from this hospitalization (including imaging, microbiology, ancillary and laboratory) are listed below for reference.     Microbiology: Recent Results (from the past 240 hour(s))  MRSA PCR Screening     Status: None   Collection Time: 02/26/17  1:48 AM  Result Value Ref Range Status   MRSA by PCR NEGATIVE NEGATIVE Final    Comment:        The GeneXpert MRSA Assay (FDA approved for NASAL specimens only), is one component of a comprehensive MRSA colonization surveillance program. It is not intended to diagnose MRSA infection nor to guide or monitor treatment for MRSA infections.      Labs: BNP (last 3 results)  Recent Labs  05/31/16 0950  BNP 144.3*   Basic Metabolic Panel:  Recent Labs Lab 02/25/17 1148 02/26/17 0510 02/28/17 0346  NA 129* 132* 137  K 4.3 4.0 3.1*  CL 93* 97* 103  CO2 25 24 24   GLUCOSE 124* 141* 100*  BUN 21* 51* 21*  CREATININE 1.21 1.09 1.16  CALCIUM 9.4 8.9 8.7*   Liver Function Tests:  Recent Labs Lab 02/26/17 0510  AST 19  ALT 13*  ALKPHOS 57  BILITOT 1.5*  PROT 6.0*  ALBUMIN 3.3*   No results for input(s): LIPASE, AMYLASE in the last 168 hours. No results for input(s): AMMONIA in the last 168 hours. CBC:  Recent Labs Lab 02/25/17 2309 02/26/17 0510 02/27/17 0755 02/27/17 1107 02/27/17 2225 02/28/17 0346  WBC 10.4 10.1 8.5 8.1  --  7.1  HGB 9.4* 8.4* 7.0* 6.8* 8.4* 8.6*  HCT 27.4* 24.3* 20.3* 20.0* 24.6* 25.2*  MCV 96.5 95.3 97.6 97.6  --  93.0  PLT 198 180 149* 148*  --  126*   Cardiac Enzymes: No results for input(s): CKTOTAL, CKMB, CKMBINDEX, TROPONINI in the last 168 hours. BNP: Invalid input(s): POCBNP CBG:  Recent Labs Lab 02/25/17 1427  GLUCAP 126*   D-Dimer No results for input(s): DDIMER in the last 72 hours. Hgb A1c No results for input(s): HGBA1C in the last 72 hours.  Lipid Profile No results for input(s): CHOL, HDL, LDLCALC, TRIG, CHOLHDL, LDLDIRECT in the last 72  hours. Thyroid function studies No results for input(s): TSH, T4TOTAL, T3FREE, THYROIDAB in the last 72 hours.  Invalid input(s): FREET3 Anemia work up No results for input(s): VITAMINB12, FOLATE, FERRITIN, TIBC, IRON, RETICCTPCT in the last 72 hours. Urinalysis    Component Value Date/Time   COLORURINE STRAW (A) 02/25/2017 1432   APPEARANCEUR CLEAR 02/25/2017 1432   LABSPEC 1.006 02/25/2017 1432   PHURINE 5.0 02/25/2017 1432   GLUCOSEU NEGATIVE 02/25/2017 1432   HGBUR NEGATIVE 02/25/2017 1432   BILIRUBINUR NEGATIVE 02/25/2017 1432   KETONESUR NEGATIVE 02/25/2017 1432   PROTEINUR 30 (A) 02/25/2017 1432   UROBILINOGEN >8.0 (H) 01/02/2015 1321   NITRITE NEGATIVE 02/25/2017 1432   LEUKOCYTESUR NEGATIVE 02/25/2017 1432   Sepsis Labs Invalid input(s): PROCALCITONIN,  WBC,  LACTICIDVEN Microbiology Recent Results (from the past 240 hour(s))  MRSA PCR Screening     Status: None   Collection Time: 02/26/17  1:48 AM  Result Value Ref Range Status   MRSA by PCR NEGATIVE NEGATIVE Final    Comment:        The GeneXpert MRSA Assay (FDA approved for NASAL specimens only), is one component of a comprehensive MRSA colonization surveillance program. It is not intended to diagnose MRSA infection nor to guide or monitor treatment for MRSA infections.      Time coordinating discharge: Over 30 minutes  SIGNED:   Cordelia Poche, MD [facility] 03/01/2017, 4:51 PM Pager [phone]  If 7PM-7AM, please contact night-coverage [email or web address] Password [email or web address]

## 2017-03-01 NOTE — Consult Note (Signed)
Cardiology Consultation:   Patient ID: [name]; [id number]; [birth year]   Admit date: 02/25/2017 Date of Consult: 03/01/2017  Primary Care Provider: Alroy Dust, L.Marlou Sa, MD Primary Cardiologist: Dr. Burt Knack  Vascular Surgery: Dr. Vinnie Level, [facility]   Patient Profile:   [name] is a 66 y.o. male with a hx of CAD s/p CABG, PVD s/p large AAA (no plans for surgery), LE PVD s/p previous stenting of the iliacs, mild right sided carotid artery disease and bilateral subclavian stenosis, who is being seen today for recommendations regarding continuation of antiplatelet therapy w/ ASA and Plavix, in the setting of  severe epistaxis/ acute blood loss anemia, at the request of Dr. Lonny Prude, internal medicine.  History of Present Illness:   [name] is a 66 y/o male with h/o significant for CAD s/p CABG and PVD with large AAA, followed by Vascular surgery at [facility]. Per records, last measurement of AAA was 6.2 cm. He also has vascular disease involving his carotid arteries (< 40% right sided ICA stenosis by recent dopplers 07/2016), subclavian vessels and iliacs (previous stenting). Given the complexity of his LE PVD and AAA, he is not a candidate for endovascular repair. He would have to be considered for open aortic aneurysm repair, which would be high risk. Given his other multiple co morbidities, the patient has elected not to undergo surgery for his AAA.  He also has a h/o RLE DVT/PE following his CABG in 2016 that was treated with Coumadin x 6 months. He has h/o COPD as well and drinks a moderate amount of ETOH, > 4 beers on a daily basis per H&P.    Dr. Burt Knack and Truitt Merle, NP, have followed him for CAD. He underwent CABG x5 with Dr. Cyndia Bent in July 2016 with a LIMA-LAD, SVG-diagonal, SVG-RI, SVG-OM and SVG to PDA.  He has not had f/u ischemic testing since that time and denies any recent anginal symptoms. His most recent echocardiogram 07/2016 showed normal LVEF at 60-65%, grade 2 DD, normal  wall motion and no valvular dysfunction.           Pt initially presented to to the [facility] ED on 02/26/17 with severe uncontrolled epistaxis, in the setting of hypertensive urgency, and antiplatelet therapy with ASA and Plavix. Initial BP was in the 583E systolic and low 940H diastolic. Platelet count dropped from 180 to 149. Hgb dropped as low at 6.8. ENT was consulted to assist with packing to achieve hemostasis. Transfusion was given x 2 units PRBCs. ASA and Plavix were held on admit. Hypertension treated with IV nitro. He has had difficulties controlling BP here in the hospital, but now under better control after increase in home lisinopril dose to 20 mg. Hemostasis achieved. No recurrent bleeding. Cardiology consulted regarding antiplatelet recommendations, in the setting of severe PVD and CAD.  ? How long ASA and Plavix can be held.      Hgb is improved post transfusion at 8.6. Platelets dropped to 126. BP is better controlled today. Last BP in the 680S systolic.  Past Medical History:  Diagnosis Date  . AAA (abdominal aortic aneurysm) ([place])   . Anxiety   . CHF (congestive heart failure) ([place])   . COPD (chronic obstructive pulmonary disease) ([place])   . Coronary artery disease   . GERD (gastroesophageal reflux disease)   . History of blood transfusion   . Hyperlipidemia   . Hypertension    takes meds daily  . Hyponatremia   . Lumbar degenerative disc disease   . Myocardial infarction ([place])   . PAD (peripheral artery disease) ([place])   . PONV (postoperative nausea and vomiting)   . Raynaud's disease /phenomenon   . Stroke [facility]) 10/15/2005    Past Surgical History:  Procedure Laterality Date  . ABDOMINAL ANGIOGRAM  03/30/2012  . ABDOMINAL AORTAGRAM N/A 03/30/2012   Procedure: ABDOMINAL Maxcine Ham;  Surgeon: Conrad Pickens, MD;  Location: [facility] CATH LAB;  Service:  Cardiovascular;  Laterality: N/A;  . CARDIAC CATHETERIZATION  2006  . CARDIAC CATHETERIZATION N/A 12/09/2014   Procedure: Left Heart Cath and Coronary Angiography;  Surgeon: [name], MD;  Location: [place] CV LAB;  Service: Cardiovascular;  Laterality: N/A;  . CAROTID ENDARTERECTOMY     bilateral  . CAROTID ENDARTERECTOMY  04/13/2005   Right and 06/03/2005 Left  . CORONARY ARTERY BYPASS GRAFT N/A 12/13/2014   Procedure: CORONARY ARTERY BYPASS GRAFT times five using left internal mammary artery and endoscopic right leg saphenous vein harvest;  Surgeon: Gaye Pollack, MD;  Location: [facility] OR;  Service: Open Heart Surgery;  Laterality: N/A;  . HERNIA REPAIR    . ILIAC ARTERY STENT  10/15/2005    Left common and external   . INTRAOPERATIVE TRANSESOPHAGEAL ECHOCARDIOGRAM N/A 12/13/2014   Procedure: INTRAOPERATIVE TRANSESOPHAGEAL ECHOCARDIOGRAM;  Surgeon: Gaye Pollack, MD;  Location: [facility] OR;  Service: Open Heart Surgery;  Laterality: N/A;     Home Medications:  Prior to Admission medications   Medication Sig Start Date End Date Taking? Authorizing Provider  Ascorbic Acid (VITAMIN C) 100 MG tablet Take 100 mg by mouth daily.   Yes [provider]  aspirin 81 MG tablet Take 81 mg by mouth every evening.    Yes [provider]  carvedilol (COREG) 25 MG tablet Take 1 tablet (25 mg total) by mouth 2 (two) times daily. 12/20/16  Yes Burtis Junes, NP  clobetasol ointment (TEMOVATE) 0.05 % apply to affected area twice a day if needed 09/24/15  Yes [provider]  cloNIDine (CATAPRES) 0.1 MG tablet Take 1 tablet (0.1 mg total) by mouth at bedtime. 02/18/16  Yes Burtis Junes, NP  clopidogrel (PLAVIX) 75 MG tablet take 1 tablet by mouth once daily 06/21/16  Yes Burtis Junes, NP  ferrous sulfate 325 (65 FE) MG EC tablet Take 325 mg by mouth daily with breakfast.   Yes [provider]  folic acid (FOLVITE) 1 MG tablet Take 1 tablet (1 mg total) by mouth daily.  01/29/15  Yes Burtis Junes, NP  furosemide (LASIX) 40 MG tablet Take 0.5  Tablet (20 mg) by mouth daily. May take and additional 0.5 tablet (20 mg) if needed for swelling.   Yes [provider]  levocetirizine (XYZAL) 5 MG tablet TAKE ONE TABLET ONCE DAILY AS DIRECTED 02/25/16  Yes Bobbitt, Sedalia Muta, MD  lisinopril (PRINIVIL,ZESTRIL) 5 MG tablet take 1 tablet by mouth once daily 09/14/16  Yes [name], MD  NITROSTAT 0.4 MG SL tablet Place 1 tablet (0.4 mg total) under the tongue every  5 (five) minutes as needed. Chest pain 09/27/16  Yes Burtis Junes, NP  pantoprazole (PROTONIX) 40 MG tablet Take 40 mg by mouth daily. 01/11/17  Yes [provider]  ranitidine (ZANTAC) 300 MG tablet take 1 tablet by mouth at bedtime 03/22/16  Yes [name], MD  rosuvastatin (CRESTOR) 20 MG tablet Take 1 tablet (20 mg total) by mouth at bedtime. 12/20/16  Yes [name], MD  Thiamine HCl (VITAMIN B-1) 100 MG tablet Take 100 mg by mouth daily.     Yes [provider]  traMADol (ULTRAM) 50 MG tablet Take 1 tablet (50 mg total) by mouth every 6 (six) hours as needed. 01/13/15  Yes Burtis Junes, NP  triamcinolone ointment (KENALOG) 0.1 % Apply 1 application topically 2 (two) times daily. 08/05/15  Yes Bobbitt, Sedalia Muta, MD    Inpatient Medications: Scheduled Meds: . amoxicillin  500 mg Oral Q8H  . carvedilol  25 mg Oral BID  . chlorhexidine  15 mL Mouth Rinse BID  . cloNIDine  0.2 mg Oral BID  . famotidine  10 mg Oral QHS  . lisinopril  20 mg Oral Daily  . mouth rinse  15 mL Mouth Rinse q12n4p  . pantoprazole  40 mg Oral Daily  . rosuvastatin  20 mg Oral QHS  . sodium chloride  2 spray Each Nare Q2H while awake  . thiamine  100 mg Intravenous Daily  . thiamine  100 mg Oral Daily   Continuous Infusions:  PRN Meds: acetaminophen **OR** acetaminophen, hydrALAZINE, LORazepam, traMADol  Allergies:    Allergies  Allergen Reactions  . Hctz  [Hydrochlorothiazide] Other (See Comments)    Black out due to electrolytes  . Norvasc [Amlodipine Besylate] Swelling       . Pletal [Cilostazol] Diarrhea  . Heparin Other (See Comments)    HIT positive as of 12/31/14    Social History:   Social History   Social History  . Marital status: Married    Spouse name: N/A  . Number of children: N/A  . Years of education: N/A   Occupational History  . MANAGER. owns theaters [employer]   Social History Main Topics  . Smoking status: Former Smoker    Types: Cigarettes    Quit date: 05/24/2005  . Smokeless tobacco: Never Used  . Alcohol use 14.4 oz/week    24 Cans of beer per week     Comment: approximately 24 beer a week  . Drug use: No  . Sexual activity: Not on file   Other Topics Concern  . Not on file   Social History Narrative  . No narrative on file    Family History:    Family History  Problem Relation Age of Onset  . Coronary artery disease Father 31       HTN, Hyperlipidemia died  . Heart disease Father        Heart Disease before age 15  . Hypertension Father   . Hyperlipidemia Father   . Peripheral vascular disease Father   . Other Mother 66        died rheumatoid arthritis  . Dementia Mother   . Heart disease Brother   . Hypertension Brother   . Hyperlipidemia Brother      ROS:  Please see the history of present illness.  ROS  All other ROS reviewed and negative.     Physical Exam/Data:   Vitals:   03/01/17 0239 03/01/17 0451 03/01/17 0500 03/01/17 0827  BP: Marland Kitchen)  175/66 (!) 166/60  137/68  Pulse: 62 64  63  Resp:  17  18  Temp:  98.7 F (37.1 C)  98.6 F (37 C)  TempSrc:    Oral  SpO2:  97%  99%  Weight:   197 lb 5 oz (89.5 kg)   Height:        Intake/Output Summary (Last 24 hours) at 03/01/17 1437 Last data filed at 03/01/17 1145  Gross per 24 hour  Intake              600 ml  Output              750 ml  Net             -150 ml   Filed Weights   02/27/17 0346 02/28/17  0500 03/01/17 0500  Weight: 199 lb 4.7 oz (90.4 kg) 197 lb 5 oz (89.5 kg) 197 lb 5 oz (89.5 kg)   Body mass index is 27.52 kg/m.  General:  Well nourished, well developed, in no acute distress HEENT: normal Lymph: no adenopathy Neck: no JVD Endocrine:  No thryomegaly Vascular: + right carotid bruitd; FA pulses 2+ bilaterally  Cardiac:  normal S1, S2; RRR; no murmur Lungs:  clear to auscultation bilaterally, no wheezing, rhonchi or rales  Abd: soft, nontender, no hepatomegaly  Ext: no edema Musculoskeletal:  No deformities, BUE and BLE strength normal and equal Skin: warm and dry  Neuro:  CNs 2-12 intact, no focal abnormalities noted Psych:  Normal affect   EKG:  The EKG was personally reviewed and demonstrates:  02/26/17 SR w/ PACs Telemetry:  Telemetry was personally reviewed and demonstrates:  NSR    Relevant CV Studies:  Carotid Dupplex 08/19/16  Left Comment: Stenotic subclavian. Brachial artery is brisk and multiphasic. Findings are essentially changed from prior CV duplex from 02/27/2015.  Final Interpretation Right Carotid: There is evidence in the ICA of a less than 40% stenosis. No  change from previous exam. Left Carotid: The extracranial vessels were near-normal with only minimal wall thickening or plaque. Vertebrals:Left vertebral artery was patent with antegrade flow. Distyrbed  flow in right vertebral artery. Subclavians: Bilateral subclavian arteries were stenotic.  LE Arterial Duplex 07/07/2016 Bilateral ABIs appear essentially unchanged compared to prior exam of 07/27/12.  Summary of Findings  Right Inflow: Evidence of inflow obstruction, possible occlusion. Outflow:No evidence of obstruction. However, severity of inflow disease may preclude detection of distal disease. Runoff: No evidence of hemodynamically significant runoff obstruction at rest. Left Inflow: No evidence of hemodynamically  significant inflow obstruction at rest. Outflow:No evidence of hemodynamically significant outflow obstruction at rest. See comment. Runoff: No evidence of hemodynamically significant runoff obstruction at rest. Comment:Diffused plaque was noted througout the CFA and SFA.  Final Interpretation  Right: Arterial obstruction involving the iliac segment.  Left: Diffuse plaque - No significant areas of obstruction. Electronically signed by [id number] on 07/07/2016 at 3:30:28 PM.  CT of Chest and Abdomen w/o Contrast 06/23/2016 --Mild interval enlargement of the infrarenal abdominal aneurysm measuring6.2 x 5.8 cm (previously 5.8 x 5.4 cm) .   --Stable left common iliac aneurysm.  --Moderate to severe atherosclerotic changes, as above, with patency and degree of stenosis unclear due to lack of contrast.   Laboratory Data:  Chemistry  Recent Labs Lab 02/25/17 1148 02/26/17 0510 02/28/17 0346  NA 129* 132* 137  K 4.3 4.0 3.1*  CL 93* 97* 103  CO2 25 24 24   GLUCOSE 124* 141*  100*  BUN 21* 51* 21*  CREATININE 1.21 1.09 1.16  CALCIUM 9.4 8.9 8.7*  GFRNONAA 60* >60 >60  GFRAA >60 >60 >60  ANIONGAP 11 11 10      Recent Labs Lab 02/26/17 0510  PROT 6.0*  ALBUMIN 3.3*  AST 19  ALT 13*  ALKPHOS 57  BILITOT 1.5*   Hematology  Recent Labs Lab 02/27/17 0755 02/27/17 1107 02/27/17 2225 02/28/17 0346  WBC 8.5 8.1  --  7.1  RBC 2.08* 2.05*  --  2.71*  HGB 7.0* 6.8* 8.4* 8.6*  HCT 20.3* 20.0* 24.6* 25.2*  MCV 97.6 97.6  --  93.0  MCH 33.7 33.2  --  31.7  MCHC 34.5 34.0  --  34.1  RDW 14.9 14.8  --  17.6*  PLT 149* 148*  --  126*   Cardiac EnzymesNo results for input(s): TROPONINI in the last 168 hours. No results for input(s): TROPIPOC in the last 168 hours.  BNPNo results for input(s): BNP, PROBNP in the last 168 hours.  DDimer No results for input(s): DDIMER in the last 168 hours.  Radiology/Studies:  No results found.  Assessment and  Plan:   1. Severe Epistaxis: occurred in the setting of hypertensive urgency and antiplatelet therapy for CAD/PVD. Hemostasis achieved. No recurrent bleeding in the last 36 hrs. BP improved. ASA and Plavix still on hold currently.    2. Acute Blood Loss Anemia: 2/2 severe epistasis with drop in Hgb down to 6.8. S/p transfusion with PRBC x 2 units. Hgb 8.6. No further bleeding reported.   3. Hypertensive Urgency: unexplained spike in BP and uncontrolled for several hours, per pt's wife. Pt has been fully complaint with home antihypertensive regimen. No dietary indiscretion with sodium. Pt required IV nitro on arrival. SBPs in the 093J and diastolic BPs in the 121K. Home lisinopril increased to 20 mg this admit. Most recent BP check showed controlled BP at 137/68. Continue to monitor. Continue Coreg, Clonidine and lisinopril.  4. CAD: s/p CABG x 5 11/2014. Stable w/o CP. ASA and Plavix currently on hold given severe epistaxis. Continue BB, statin and ACE-I.   5. PVD: vasculopath with LE PVD s/p left illac stenting in the past, bilateral subclavian artery stenosis, mild right sided carotid artery disease and AAA. ASA and Plavix on hold per above. He is on statin therapy with Crestor.   6. Antiplatelet Therapy Regimen: Pt previously on chronic DAPT with ASA and Plavix given significant CAD and PVD history. Both agents have been on hold x 3 days given severe epistasis, thrombocytopenia and acute blood loss anemia. Platelet count yesterday was 126. No bleeding reported today. Will discuss with MD how long we can continue to hold ASA and Plavix from a cardiovascular standpoint, to reduce risk for early recurrent bleed.  7. AAA: last assessment 05/2016 showed further enlargement in AAA to 6.2 cm. Not a candidate for endovascular repair and at extremely high risk for open repair. Pt has elected not to undergo surgery. He is followed by Dr. Sammuel Hines at [facility].     For questions or updates, please contact [place] Please consult [email or web address] for contact info under Cardiology/STEMI.   Signed, Lyda Jester, PA-C  03/01/2017 2:37 PM

## 2017-03-01 NOTE — Progress Notes (Signed)
NURSING PROGRESS NOTE  [name] [id number] Discharge Data: 03/01/2017 6:35 PM Attending Provider: Mariel Aloe, MD GBE:[id number], L.Marlou Sa, MD   [name] to be D/C'd Home per MD order.    All IV's will be discontinued and monitored for bleeding.  All belongings will be returned to patient for patient to take home.  Last Documented Vital Signs:  Blood pressure 124/70, pulse 78, temperature 98.2 F (36.8 C), temperature source Oral, resp. rate 17, height 5\' 11"  (1.803 m), weight 89.5 kg (197 lb 5 oz), SpO2 97 %.  Joslyn Hy, MSN, RN, [employer]

## 2017-03-01 NOTE — Telephone Encounter (Signed)
S/w wife per [facility]) is concerned about pt being in the hospital, being off of plavix, and elevated bp. S/w Cecille Rubin stated to tell hospitalist pt wants a cardiac consult.  Will call pts wife back is talking to ENT.

## 2017-03-01 NOTE — Telephone Encounter (Signed)
Called pt's wife back per (DPR), ENT just took packing out, is going to ask for a cardiologist consult. Scheduled appt for pt to see Cecille Rubin on Oct 15.

## 2017-03-01 NOTE — Evaluation (Signed)
Physical Therapy Evaluation Patient Details Name: [name] MRN: [id number] DOB: 15-Apr-1951 Today's Date: 03/01/2017   History of Present Illness  Pt is a 66 y/o male who presents s/p epistaxis. PMH significant for COPD, PE, CVA, AAA, PAD s/p iliac stent, CAD s/p CABG, per pt report cyst on a disc awaiting outpatient neurosurgery consult.   Clinical Impression  Pt admitted with above diagnosis. Pt currently with functional limitations due to the deficits listed below (see PT Problem List). At the time of PT eval pt was able to perform transfers with gross min guard assist for safety. Pt attempted ambulation however was significantly limited by dizziness and back pain. He was able to take 2 steps away from the bed prior to stating he could no longer continue. Per pt/wife, he will have to ambulate at least 60 feet and negotiate stairs to enter home. Pt will benefit from skilled PT to increase their independence and safety with mobility to allow discharge to the venue listed below.       Follow Up Recommendations Home health PT;Supervision/Assistance - 24 hour    Equipment Recommendations  None recommended by PT    Recommendations for Other Services       Precautions / Restrictions Precautions Precautions: Fall Restrictions Weight Bearing Restrictions: No      Mobility  Bed Mobility Overal bed mobility: Needs Assistance Bed Mobility: Rolling;Sit to Sidelying;Supine to Sit Rolling: Supervision   Supine to sit: Min guard   Sit to sidelying: Min assist General bed mobility comments: Assist required for pt to elevate LE's up onto bed for return to supine. Pt was instructed on log roll technique for comfort. Increased time required for all aspects of bed mobility.  Transfers Overall transfer level: Needs assistance Equipment used: Rolling walker (2 wheeled) Transfers: Sit to/from Stand Sit to Stand: Min guard         General transfer comment: VC's for hand placement on  seated surface for safety. Pt was able to power-up to full stand with inreased time for initiation.   Ambulation/Gait Ambulation/Gait assistance: Min guard Ambulation Distance (Feet): 2 Feet Assistive device: Rolling walker (2 wheeled) Gait Pattern/deviations: Step-to pattern;Decreased stride length;Trunk flexed Gait velocity: Decreased Gait velocity interpretation: Below normal speed for age/gender General Gait Details: Pt tolerated 2 steps forward before stating he was unable to continue due to back pain and dizziness. Pt was instructed in 2 steps backwards to reach the bed to sit.   Stairs            Wheelchair Mobility    Modified Rankin (Stroke Patients Only)       Balance Overall balance assessment: Needs assistance Sitting-balance support: Feet supported;No upper extremity supported Sitting balance-Leahy Scale: Fair Sitting balance - Comments: Due to back pain Postural control: Posterior lean Standing balance support: Bilateral upper extremity supported;During functional activity Standing balance-Leahy Scale: Poor Standing balance comment: Reliant on RW for support at this time                              Pertinent Vitals/Pain Pain Assessment: No/denies pain    Home Living Family/patient expects to be discharged to:: Private residence Living Arrangements: Spouse/significant other Available Help at Discharge: Family;Available 24 hours/day Type of Home: House Home Access: Stairs to enter Entrance Stairs-Rails: Right;Left (A little far apart) Entrance Stairs-Number of Steps: 3 Home Layout: Two level Home Equipment: Walker - 2 wheels;Grab bars - tub/shower;Shower seat - built in  Prior Function Level of Independence: Independent               Hand Dominance   Dominant Hand: Left    Extremity/Trunk Assessment   Upper Extremity Assessment Upper Extremity Assessment: Defer to OT evaluation    Lower Extremity Assessment Lower  Extremity Assessment: Generalized weakness    Cervical / Trunk Assessment Cervical / Trunk Assessment: Other exceptions Cervical / Trunk Exceptions: Generally flexed posture. Pt reports chronic pain and cyst on disc.  Communication   Communication: No difficulties  Cognition Arousal/Alertness: Awake/alert Behavior During Therapy: WFL for tasks assessed/performed;Flat affect (At times) Overall Cognitive Status: Within Functional Limits for tasks assessed                                        General Comments      Exercises     Assessment/Plan    PT Assessment Patient needs continued PT services  PT Problem List Decreased strength;Decreased range of motion;Decreased activity tolerance;Decreased balance;Decreased mobility;Decreased knowledge of use of DME;Decreased safety awareness;Decreased knowledge of precautions;Pain       PT Treatment Interventions DME instruction;Gait training;Stair training;Functional mobility training;Therapeutic activities;Therapeutic exercise;Neuromuscular re-education;Patient/family education    PT Goals (Current goals can be found in the Care Plan section)  Acute Rehab PT Goals Patient Stated Goal: Feel better; get home PT Goal Formulation: With patient/family Time For Goal Achievement: 03/08/17 Potential to Achieve Goals: Good    Frequency Min 3X/week   Barriers to discharge        Co-evaluation               AM-PAC PT "6 Clicks" Daily Activity  Outcome Measure Difficulty turning over in bed (including adjusting bedclothes, sheets and blankets)?: A Little Difficulty moving from lying on back to sitting on the side of the bed? : A Little Difficulty sitting down on and standing up from a chair with arms (e.g., wheelchair, bedside commode, etc,.)?: A Little Help needed moving to and from a bed to chair (including a wheelchair)?: A Little Help needed walking in hospital room?: A Little Help needed climbing 3-5 steps with  a railing? : A Little 6 Click Score: 18    End of Session Equipment Utilized During Treatment: Gait belt Activity Tolerance: Patient limited by pain;Patient limited by fatigue Patient left: in bed;with call bell/phone within reach;with bed alarm set;with family/visitor present Nurse Communication: Mobility status PT Visit Diagnosis: Unsteadiness on feet (R26.81);Dizziness and giddiness (R42);Pain Pain - part of body:  (Back)    Time: 6195-0932 PT Time Calculation (min) (ACUTE ONLY): 26 min   Charges:   PT Evaluation $PT Eval Moderate Complexity: 1 Mod PT Treatments $Gait Training: 8-22 mins   PT G Codes:        Rolinda Roan, PT, DPT Acute Rehabilitation Services Pager: [phone]   Thelma Comp 03/01/2017, 11:13 AM

## 2017-03-01 NOTE — Progress Notes (Signed)
   Subjective:    Patient ID: [name], male    DOB: 14-Nov-1950, 66 y.o.   MRN: [id number]  HPI Did well overnight.  Moved to regular room.  No bleeding.  Review of Systems     Objective:   Physical Exam AF VSS except for elevated BP Alert, NAD Removed left nasal pack, no bleeding.    Assessment & Plan:  Left epistaxis, anti-platelet therapy, hypertension  Left pack successfully removed with no bleeding.  He was instructed to avoid blowing or picking.  He should use saline spray every two hours while awake.  Ideally, I would prefer he hold Plavix and aspirin for two more days before restarting.  He can follow-up in two weeks.

## 2017-03-02 ENCOUNTER — Other Ambulatory Visit: Payer: Self-pay | Admitting: *Deleted

## 2017-03-02 MED ORDER — LISINOPRIL 20 MG PO TABS: 10.0000 mg | ORAL_TABLET | Freq: Every day | ORAL | 0 refills | Status: DC

## 2017-03-02 NOTE — Telephone Encounter (Signed)
S/w pt's wife is aware of Lori's recommendation's.  Medication list updated.

## 2017-03-02 NOTE — Telephone Encounter (Signed)
New Message     Pt c/o BP issue: STAT if pt c/o blurred vision, one-sided weakness or slurred speech  1. What are your last 5 BP readings? 95/59 (just now) 123/69 (this morning) 91/55 (1p)  2. Are you having any other symptoms (ex. Dizziness, headache, blurred vision, passed out)? Very weak  , dizziness   3. What is your BP issue?  Was just released from hospital , they increased his medication at the hospital

## 2017-03-02 NOTE — Telephone Encounter (Signed)
Verify that the Coreg is 25 BID and the Lisinopril is 20 mg a day.   If correct,   Tonight - only take 1/2 Coreg (12.5mg ) - tomorrow resume full dose  Tomorrow - start only 1/2 dose Lisinopril 10 mg a day.   Continue to monitor.

## 2017-03-02 NOTE — Consult Note (Signed)
           [facility] CM Primary Care Navigator  03/02/2017  [name] 12/31/1950 [id number]   Attemptto seepatient at the bedsideto identify possible discharge needs but he was alreadydischargedper staff report.  Patient was discharged home yesterday with home health services per therapy recommendation.  Primary care provider's officeis listed as doing transition of care (TOC).  Patient has discharge instruction to follow-up with primary care provider in a week.   For questions, please contact:  Dannielle Huh, BSN, RN- [facility] Primary Care Navigator  Telephone: [phone] [place]

## 2017-03-04 NOTE — Telephone Encounter (Signed)
°  New Message   pt wife verbalized that she is returning call for rn  She dont know what is a low or high bp for pt   Please call pt wife to discuss

## 2017-03-04 NOTE — Telephone Encounter (Signed)
S/w pt's wife was concern about pt's bp's. 132/59,142/68,130/63 yesterday, 141/66, 110,62, today, 107/60, 113/58. Pt is feeling fine, no dizzy spells or passing out, pt is now eating. Stated readings are fine.  Reiterated only take bp twice daily, keep record and bring in on Monday for appt with Cecille Rubin.  Pt's wife stated feels much better.  Will send to [place] to [place].

## 2017-03-04 NOTE — Telephone Encounter (Signed)
Agree 

## 2017-03-05 DIAGNOSIS — I160 Hypertensive urgency: Secondary | ICD-10-CM | POA: Diagnosis not present

## 2017-03-05 DIAGNOSIS — I110 Hypertensive heart disease with heart failure: Secondary | ICD-10-CM | POA: Diagnosis not present

## 2017-03-05 DIAGNOSIS — I2510 Atherosclerotic heart disease of native coronary artery without angina pectoris: Secondary | ICD-10-CM | POA: Diagnosis not present

## 2017-03-05 DIAGNOSIS — J449 Chronic obstructive pulmonary disease, unspecified: Secondary | ICD-10-CM | POA: Diagnosis not present

## 2017-03-07 ENCOUNTER — Encounter: Payer: Self-pay | Admitting: Nurse Practitioner

## 2017-03-07 ENCOUNTER — Ambulatory Visit (INDEPENDENT_AMBULATORY_CARE_PROVIDER_SITE_OTHER): Payer: Medicare Other | Admitting: Nurse Practitioner

## 2017-03-07 VITALS — BP 200/100 | HR 54 | Ht 71.0 in | Wt 203.0 lb

## 2017-03-07 DIAGNOSIS — I5022 Chronic systolic (congestive) heart failure: Secondary | ICD-10-CM

## 2017-03-07 DIAGNOSIS — I25810 Atherosclerosis of coronary artery bypass graft(s) without angina pectoris: Secondary | ICD-10-CM | POA: Diagnosis not present

## 2017-03-07 DIAGNOSIS — I10 Essential (primary) hypertension: Secondary | ICD-10-CM | POA: Diagnosis not present

## 2017-03-07 DIAGNOSIS — R040 Epistaxis: Secondary | ICD-10-CM

## 2017-03-07 MED ORDER — LISINOPRIL 20 MG PO TABS: 20.0000 mg | ORAL_TABLET | Freq: Every day | ORAL | 0 refills | Status: DC

## 2017-03-07 MED ORDER — CLONIDINE HCL 0.2 MG PO TABS: 0.2000 mg | ORAL_TABLET | Freq: Two times a day (BID) | ORAL | 11 refills | Status: DC

## 2017-03-07 MED ORDER — LISINOPRIL 20 MG PO TABS: 20.0000 mg | ORAL_TABLET | Freq: Every day | ORAL | 11 refills | Status: DC

## 2017-03-07 NOTE — Progress Notes (Signed)
CARDIOLOGY OFFICE NOTE  Date:  03/07/2017    [name] Date of Birth: Feb 18, 1951 Medical Record #[id number]  PCP:  Alroy Dust, L.Marlou Sa, MD  Cardiologist:  Jerel Shepherd  Chief Complaint  Patient presents with  . Hypertension  . Coronary Artery Disease    Post hospital visit - seen for Dr. Burt Knack    History of Present Illness: [name] is a 66 y.o. male who presents today for a post hospital visit. Seen for Dr. Burt Knack. Former patient of Dr. Susa Simmonds.   He has an extensive history of PVD and CAD. He was previously followed by Dr. Bridgett Larsson at [facility] but now seeing Vascular in [facility] with Dr. Sammuel Hines. Prior cath in 2006 showing normal LV function with a totally occluded RCA with left to right collaterals. He has distal small vessel disease in the RCA with diffuse mild to moderate stenosis in the LAD. Former smoker. His PVD history is quite extensive and includes AAA (with no plans to intervene due to the nature of his aneurysm and his multiple comorbidities - noted scan was 5.8 x 5.4 from 02/2015).   Other problems include HTN, HLD and OA. He has a history of hyponatremia as well. He had CABG x 5 by Dr. Cyndia Bent back in 9562 - complicated by DVT and volume overload. Was on IV heparin, transitioned to Xarelto due to drop in platelets, HIT panel + and serotonin release assay + as well and he was changed to Arixtra and seen by Dr. Alen Blew. Warfarin started and he was on 6 months of therapy and then transitioned back to Plavix. .   I have seen him several times over the past few years. He has had intermittent issues with his BP. Has had intermittent issues with volume overload. He has gone back to excessive alcohol use. He had not planned on going back to [facility] but a CT by ortho here showed the aneurysm - referred back to [facility]. There is no plan for surgical intervention.   Last seen about 6 weeks ago - she had had shoulder surgery. He had had a fall. Had been found to have blood in  his stool - saw GI - placed on PPI therapy, iron and Vit C. He was to have a repeat stool test. Cardiac status was ok. BP was up a little but they followed it at home and called in with a diary which was improved.   Has been admitted earlier this month with significant epistaxis - actually had to be transfused. ASA/Plavix stopped. BP was pretty high and he required several med changes.   Discharged last week - called back the day after discharge - BP super low and we had to start cutting back.   Comes in today. Here with his wife. She is out of her sling. He is in the wheelchair. He says he is doing ok. No further bleeding noted. Has to see ENT in a couple of weeks. Due to see PCP. He is getting some PT at home.  He has stopped his iron due to being constipated. He has sent in repeat stool cards. He remains off of aspirin and Plavix. BP diary is just all over the place - high and low. Lots of variability. BP is sky high here today. He is not back to drinking but notes "probably will be soon".   Past Medical History:  Diagnosis Date  . AAA (abdominal aortic aneurysm) ([place])   . Anxiety   .  CHF (congestive heart failure) ([place])   . COPD (chronic obstructive pulmonary disease) ([place])   . Coronary artery disease   . GERD (gastroesophageal reflux disease)   . History of blood transfusion   . Hyperlipidemia   . Hypertension    takes meds daily  . Hyponatremia   . Lumbar degenerative disc disease   . Myocardial infarction ([place])   . PAD (peripheral artery disease) ([place])   . PONV (postoperative nausea and vomiting)   . Raynaud's disease /phenomenon   . Stroke [facility]) 10/15/2005    Past Surgical History:  Procedure Laterality Date  . ABDOMINAL ANGIOGRAM  03/30/2012  . ABDOMINAL AORTAGRAM N/A 03/30/2012   Procedure: ABDOMINAL Maxcine Ham;  Surgeon: Conrad Ehrhardt, MD;  Location: [facility] CATH LAB;  Service: Cardiovascular;  Laterality: N/A;  . CARDIAC CATHETERIZATION  2006  . CARDIAC CATHETERIZATION N/A  12/09/2014   Procedure: Left Heart Cath and Coronary Angiography;  Surgeon: [name], MD;  Location: [facility] CV LAB;  Service: Cardiovascular;  Laterality: N/A;  . CAROTID ENDARTERECTOMY     bilateral  . CAROTID ENDARTERECTOMY  04/13/2005   Right and 06/03/2005 Left  . CORONARY ARTERY BYPASS GRAFT N/A 12/13/2014   Procedure: CORONARY ARTERY BYPASS GRAFT times five using left internal mammary artery and endoscopic right leg saphenous vein harvest;  Surgeon: Gaye Pollack, MD;  Location: [facility] OR;  Service: Open Heart Surgery;  Laterality: N/A;  . HERNIA REPAIR    . ILIAC ARTERY STENT  10/15/2005    Left common and external   . INTRAOPERATIVE TRANSESOPHAGEAL ECHOCARDIOGRAM N/A 12/13/2014   Procedure: INTRAOPERATIVE TRANSESOPHAGEAL ECHOCARDIOGRAM;  Surgeon: Gaye Pollack, MD;  Location: [facility] OR;  Service: Open Heart Surgery;  Laterality: N/A;     Medications: Current Meds  Medication Sig  . Ascorbic Acid (VITAMIN C) 100 MG tablet Take 100 mg by mouth daily.  . carvedilol (COREG) 25 MG tablet Take 1 tablet (25 mg total) by mouth 2 (two) times daily.  . clobetasol ointment (TEMOVATE) 0.05 % apply to affected area twice a day if needed  . cloNIDine (CATAPRES) 0.2 MG tablet Take 1 tablet (0.2 mg total) by mouth 2 (two) times daily.  . ferrous sulfate 325 (65 FE) MG EC tablet Take 325 mg by mouth daily with breakfast.  . folic acid (FOLVITE) 1 MG tablet Take 1 tablet (1 mg total) by mouth daily.  . furosemide (LASIX) 40 MG tablet Take 0.5  Tablet (20 mg) by mouth daily. May take and additional 0.5 tablet (20 mg) if needed for swelling.  Marland Kitchen lisinopril (PRINIVIL,ZESTRIL) 20 MG tablet Take 1 tablet (20 mg total) by mouth daily.  Marland Kitchen NITROSTAT 0.4 MG SL tablet Place 1 tablet (0.4 mg total) under the tongue every 5 (five) minutes as needed. Chest pain  . pantoprazole (PROTONIX) 40 MG tablet Take 40 mg by mouth daily.  . ranitidine (ZANTAC) 300 MG tablet take 1 tablet by mouth at bedtime  .  rosuvastatin (CRESTOR) 20 MG tablet Take 1 tablet (20 mg total) by mouth at bedtime.  . Thiamine HCl (VITAMIN B-1) 100 MG tablet Take 100 mg by mouth daily.    . traMADol (ULTRAM) 50 MG tablet Take 1 tablet (50 mg total) by mouth every 6 (six) hours as needed.  . triamcinolone ointment (KENALOG) 0.1 % Apply 1 application topically 2 (two) times daily.  . [DISCONTINUED] cloNIDine (CATAPRES) 0.2 MG tablet Take 1 tablet (0.2 mg total) by mouth 2 (two) times daily.  . [DISCONTINUED] levocetirizine (XYZAL)  5 MG tablet TAKE ONE TABLET ONCE DAILY AS DIRECTED  . [DISCONTINUED] lisinopril (PRINIVIL,ZESTRIL) 20 MG tablet Take 0.5 tablets (10 mg total) by mouth daily.  . [DISCONTINUED] lisinopril (PRINIVIL,ZESTRIL) 20 MG tablet Take 1 tablet (20 mg total) by mouth daily.     Allergies: Allergies  Allergen Reactions  . Hctz [Hydrochlorothiazide] Other (See Comments)    Black out due to electrolytes  . Norvasc [Amlodipine Besylate] Swelling       . Pletal [Cilostazol] Diarrhea  . Heparin Other (See Comments)    HIT positive as of 12/31/14    Social History: The patient  reports that he quit smoking about 11 years ago. His smoking use included Cigarettes. He has never used smokeless tobacco. He reports that he drinks about 14.4 oz of alcohol per week . He reports that he does not use drugs.   Family History: The patient's family history includes Coronary artery disease (age of onset: 76) in his father; Dementia in his mother; Heart disease in his brother and father; Hyperlipidemia in his brother and father; Hypertension in his brother and father; Other (age of onset: 5) in his mother; Peripheral vascular disease in his father.   Review of Systems: Please see the history of present illness.   Otherwise, the review of systems is positive for none.   All other systems are reviewed and negative.   Physical Exam: VS:  BP (!) 200/100 (BP Location: Left Arm, Patient Position: Sitting, Cuff Size: Normal)    Pulse (!) 54   Ht 5\' 11"  (1.803 m)   Wt 203 lb (92.1 kg)   SpO2 98% Comment: at rest  BMI 28.31 kg/m  .  BMI Body mass index is 28.31 kg/m.  Wt Readings from Last 3 Encounters:  03/07/17 203 lb (92.1 kg)  03/01/17 197 lb 5 oz (89.5 kg)  01/25/17 216 lb 12.8 oz (98.3 kg)   BP 180/80 in the left and 160/90 in the right.   General: Pleasant. He is alert and in no acute distress.  Color is a little sallow.  HEENT: Normal.  Neck: Supple, no JVD, carotid bruits, or masses noted.  Cardiac: Regular rate and rhythm. No murmurs, rubs, or gallops. No edema.  Respiratory:  Lungs are clear to auscultation bilaterally with normal work of breathing.  GI: Soft and nontender.  MS: No deformity or atrophy. Gait not tested Skin: Warm and dry. Color is normal.  Neuro:  Strength and sensation are intact and no gross focal deficits noted.  Psych: Alert, appropriate and with normal affect.   LABORATORY DATA:  EKG:  EKG is not ordered today.  Lab Results  Component Value Date   WBC 7.1 02/28/2017   HGB 8.6 (L) 02/28/2017   HCT 25.2 (L) 02/28/2017   PLT 126 (L) 02/28/2017   GLUCOSE 100 (H) 02/28/2017   CHOL 111 01/25/2017   TRIG 50 01/25/2017   HDL 58 01/25/2017   LDLCALC 43 01/25/2017   ALT 13 (L) 02/26/2017   AST 19 02/26/2017   NA 137 02/28/2017   K 3.1 (L) 02/28/2017   CL 103 02/28/2017   CREATININE 1.16 02/28/2017   BUN 21 (H) 02/28/2017   CO2 24 02/28/2017   TSH 0.60 08/05/2015   INR 0.98 02/25/2017   HGBA1C 5.4 12/12/2014     BNP (last 3 results)  Recent Labs  05/31/16 0950  BNP 605.7*    ProBNP (last 3 results) No results for input(s): PROBNP in the last 8760 hours.   Other  Studies Reviewed Today:  Limted CT 06/23/2016 Impressions    --Limited study in the absence of IV contrast.    --Mild interval enlargement of the infrarenal abdominal aneurysm measuring6.2 x 5.8 cm (previously 5.8 x 5.4 cm) .     --Stable left common iliac aneurysm.      --Moderate to severe atherosclerotic changes, as above, with patency and degree of stenosis unclear due to lack of contrast.    --Umbilical hernia including loop of bowel with no clear overlying skin, likely an ostomy however unclear by CT.        TEE Study Conclusions from 11/2014  - Left ventricle: Systolic function was mildly reduced. The  estimated ejection fraction was in the range of 45% to 50%.  Hypokinesis of the inferior myocardium. - Aortic valve: No evidence of vegetation. There was trivial  regurgitation. - Mitral valve: Systolic bowing without prolapse. There was mild  regurgitation. - Left atrium: No evidence of thrombus in the atrial cavity or  appendage. No evidence of thrombus in the appendage. - Atrial septum: No defect or patent foramen ovale was identified. - Tricuspid valve: No evidence of vegetation.   Assessment/Plan:  1. Epistaxis - requiring transfusion - now off DAPT - recheck his lab today.  2. HTN - lots of lability - wonder how much RAS could be playing a role here - BP higher in the left arm - increasing his ACE back up. They will monitor at home. I think this is going to be quite challenging to manage.  3. Chronic systolic HF- seems ok - but always an issue.   4.  Prior RLE DVT and PE - treated with 6 months of Coumadin - this was stopped March 1st, 2017. Did have + HIT panel. No longer seeing Dr. Alen Blew.   5. Thrombocytopenia  - Heparin induced clinically and HIT Ab panel positive - Serotonin release assay positive as well and hence HIT confirmed - Heparin is listed as an allergy for him  6. CAD with prior NSTEMI/S/P CABG x 5 (12/13/14) - Continue beta blocker, crestor. Now off Plavix. No symptoms of angina.   7.  PAD  -followed byDr. Sammuel Hines but no plans to intervene.   8. AAA  -Very complex and due to degree of calcification has been deemed not electively operable - has gotten back to see Dr. Sammuel Hines. No plans  for surgical intervention at this time.   9. Umbilical hernia - no plans to intervene on.  10 Anemia - + blood in stools - has had repeat stool cards - results pending.   Current medicines are reviewed with the patient today.  The patient does not have concerns regarding medicines other than what has been noted above.  The following changes have been made:  See above.  Labs/ tests ordered today include:    Orders Placed This Encounter  Procedures  . Basic metabolic panel  . CBC     Disposition:   FU with me as planned in November.   Patient is agreeable to this plan and will call if any problems develop in the interim.   SignedTruitt Merle, NP  03/07/2017 3:59 PM  [facility] [address] [place], [place]  [address] Phone: [phone] Fax: [phone]

## 2017-03-07 NOTE — Patient Instructions (Addendum)
We will be checking the following labs today - BMET and CBC  Medication Instructions:    Continue with your current medicines. BUT  We will stay off the aspirin and Plavix for now.   Increase the Lisinopril back to whole tablet  I refilled the Clonidine and Lisinopril    Testing/Procedures To Be Arranged:  N/A  Follow-Up:   See me in November as planned.    Other Special Instructions:   BP in the left arm    If you need a refill on your cardiac medications before your next appointment, please call your pharmacy.   Call the [place] office at [phone] if you have any questions, problems or concerns.

## 2017-03-08 DIAGNOSIS — D649 Anemia, unspecified: Secondary | ICD-10-CM | POA: Diagnosis not present

## 2017-03-08 LAB — BASIC METABOLIC PANEL
BUN/Creatinine Ratio: 14 (ref 10–24)
BUN: 23 mg/dL (ref 8–27)
CO2: 21 mmol/L (ref 20–29)
Calcium: 8.9 mg/dL (ref 8.6–10.2)
Chloride: 99 mmol/L (ref 96–106)
Creatinine, Ser: 1.67 mg/dL — ABNORMAL HIGH (ref 0.76–1.27)
GFR calc Af Amer: 48 mL/min/{1.73_m2} — ABNORMAL LOW (ref >59)
GFR calc non Af Amer: 41 mL/min/{1.73_m2} — ABNORMAL LOW (ref >59)
Glucose: 86 mg/dL (ref 65–99)
Potassium: 5.2 mmol/L (ref 3.5–5.2)
Sodium: 134 mmol/L (ref 134–144)

## 2017-03-08 LAB — CBC
Hematocrit: 24.4 % — ABNORMAL LOW (ref 37.5–51.0)
Hemoglobin: 8.4 g/dL — ABNORMAL LOW (ref 13.0–17.7)
MCH: 32.4 pg (ref 26.6–33.0)
MCHC: 34.4 g/dL (ref 31.5–35.7)
MCV: 94 fL (ref 79–97)
Platelets: 195 10*3/uL (ref 150–379)
RBC: 2.59 x10E6/uL — CL (ref 4.14–5.80)
RDW: 17.1 % — ABNORMAL HIGH (ref 12.3–15.4)
WBC: 5.5 10*3/uL (ref 3.4–10.8)

## 2017-03-09 ENCOUNTER — Telehealth: Payer: Self-pay | Admitting: Cardiovascular Disease

## 2017-03-09 NOTE — Telephone Encounter (Signed)
New message    [name] is calling from [place]. He needs to know if pt has CHF. If so, do you want pt to get on scale daily and notify with 2-3 lb increase. He was unable to get BP using manual unit. Using home automatic cuff 173/71 standing 124/no bottom number. Pt reported intermittent fatigue with standing.

## 2017-03-09 NOTE — Telephone Encounter (Signed)
Called to check on patient. Confirmed with patient he does have a scale at home. Reiterated to him to weigh daily and to call if he gains 3 lbs in a day or 5 lbs in a week.  Confirmed with him he made his medication changes per Cecille Rubin at [facility] 2 days ago. He says his BP is still a little elevated (it was 170/70 today) but is coming down. He will continue to monitor BP and will call if it remains high. He reports he feels better than he did at [place]. He understands to call with weight gain or HTN prior to already scheduled appointment in November if needed. He was grateful for call.

## 2017-03-10 DIAGNOSIS — I10 Essential (primary) hypertension: Secondary | ICD-10-CM | POA: Diagnosis not present

## 2017-03-10 DIAGNOSIS — I2510 Atherosclerotic heart disease of native coronary artery without angina pectoris: Secondary | ICD-10-CM | POA: Diagnosis not present

## 2017-03-10 DIAGNOSIS — Z23 Encounter for immunization: Secondary | ICD-10-CM | POA: Diagnosis not present

## 2017-03-10 DIAGNOSIS — M545 Low back pain: Secondary | ICD-10-CM | POA: Diagnosis not present

## 2017-03-14 DIAGNOSIS — D509 Iron deficiency anemia, unspecified: Secondary | ICD-10-CM | POA: Diagnosis not present

## 2017-03-14 DIAGNOSIS — R195 Other fecal abnormalities: Secondary | ICD-10-CM | POA: Diagnosis not present

## 2017-03-15 ENCOUNTER — Telehealth: Payer: Self-pay | Admitting: Cardiovascular Disease

## 2017-03-15 DIAGNOSIS — R040 Epistaxis: Secondary | ICD-10-CM | POA: Diagnosis not present

## 2017-03-15 NOTE — Telephone Encounter (Signed)
New Message     Pt c/o BP issue: STAT if pt c/o blurred vision, one-sided weakness or slurred speech  1. What are your last 5 BP readings? 188/90 171/88 186/80 hr 59-62  2. Are you having any other symptoms (ex. Dizziness, headache, blurred vision, passed out)? no 3. What is your BP issue? Elevated bp wants to talk to nurse

## 2017-03-15 NOTE — Telephone Encounter (Signed)
Confirmed with patient's wife that BP should be taken in L arm. She reports her husband has no symptoms and feels fine. She will continue to monitor BP and will call if he has any symptoms. She will keep log of BP readings to bring to [place] at next OV. She will also bring BP monitor used at home to compare with one at the office (she says the one at home is reading about 140s/70s). She was grateful for call and agrees with treatment plan.

## 2017-03-15 NOTE — Telephone Encounter (Signed)
[name] (PT with [facility]) called to report patient has high blood pressure. His initial BP was 188/90 this afternoon. He sat and rested and it decreased to 171/88. His HR has been stable around 60. He has no other symptoms at all.  He is taking his medications as directed. He took his morning medications around 0900. He understands he will be given a call with further recommendations.

## 2017-03-15 NOTE — Telephone Encounter (Signed)
Let's continue to monitor for now.  Should be taking BP in left arm.

## 2017-03-30 ENCOUNTER — Encounter: Payer: Self-pay | Admitting: Nurse Practitioner

## 2017-03-30 ENCOUNTER — Ambulatory Visit (INDEPENDENT_AMBULATORY_CARE_PROVIDER_SITE_OTHER): Payer: Medicare Other | Admitting: Nurse Practitioner

## 2017-03-30 VITALS — BP 110/80 | HR 68 | Ht 72.0 in | Wt 200.4 lb

## 2017-03-30 DIAGNOSIS — I739 Peripheral vascular disease, unspecified: Secondary | ICD-10-CM | POA: Diagnosis not present

## 2017-03-30 DIAGNOSIS — R040 Epistaxis: Secondary | ICD-10-CM | POA: Diagnosis not present

## 2017-03-30 DIAGNOSIS — I5022 Chronic systolic (congestive) heart failure: Secondary | ICD-10-CM | POA: Diagnosis not present

## 2017-03-30 DIAGNOSIS — I10 Essential (primary) hypertension: Secondary | ICD-10-CM

## 2017-03-30 DIAGNOSIS — I25810 Atherosclerosis of coronary artery bypass graft(s) without angina pectoris: Secondary | ICD-10-CM | POA: Diagnosis not present

## 2017-03-30 NOTE — Progress Notes (Signed)
CARDIOLOGY OFFICE NOTE  Date:  03/30/2017    [name] Date of Birth: 1951/03/19 Medical Record #[id number]  PCP:  Alroy Dust, L.Marlou Sa, MD  Cardiologist:  Jerel Shepherd    Chief Complaint  Patient presents with  . Coronary Artery Disease  . Congestive Heart Failure  . Hypertension    Follow up visit - seen for Dr. Burt Knack    History of Present Illness: [name] is a 66 y.o. male who presents today for a follow up visit. Seen for Dr. Burt Knack. Former patient of Dr. Susa Simmonds.   He has an extensive history of PVD and CAD. He was previously followed by Dr. Bridgett Larsson at [facility] but now seeing [facility] in [facility] with Dr. Sammuel Hines. Prior cath in 2006 showing normal LV function with a totally occluded RCA with left to right collaterals. He has distal small vessel disease in the RCA with diffuse mild to moderate stenosis in the LAD. Former smoker. His PVD history is quite extensive and includes AAA (with no plans to intervene due to the nature of his aneurysm and his multiple comorbidities - noted scan was 5.8 x 5.4 from 02/2015).   Other problems include HTN, HLD and OA. He has a history of hyponatremia as well. He had CABG x 5 by Dr. Cyndia Bent back in 0093 - complicated by DVT and volume overload. Was on IV heparin, transitioned to Xarelto due to drop in platelets, HIT panel + and serotonin release assay + as well and he was changed to Arixtra and seen by Dr. Alen Blew. Warfarin started and he wason 6 months of therapy and then transitioned back to Plavix. .   I have seen him several times over the past few years. He has had intermittent issues with his BP. Has had intermittent issues with volume overload. He has gone back to excessive alcohol use. He had not planned on going back to [facility] but a CT by ortho here showed the aneurysm - referred back to [facility]. There is no plan for surgical intervention.   Seen back in early September - she had had shoulder surgery. He had had a fall. Had been  found to have blood in his stool - saw GI - placed on PPI therapy, iron and Vit C. He was to have a repeat stool test. Cardiac status was ok. BP was up a little but they followed it at home and called in with a diary which was improved.   Admitted back in October with significant epistaxis - actually had to be transfused. ASA/Plavix stopped. BP was pretty high and he required several med changes.   Called almost immediately after that discharge - BP super low and we had to start cutting back. I then saw him last month - he was doing ok. No further bleeding. Off his iron due to constipation. Repeat stool cards were pending. BP were quite labile - sky high here. We have continued to make medicine adjustments.   Comes in today. Here with his wife. He seems to be doing ok. BP still with some lability. Does not stay high consistently or stay low consistently.  Lots of "being tired". Had PT coming - actually got him on the bike - but then someone else came - he does not like who comes out now - may not be getting any more therapy. No chest pain. He is pretty sedentary for the most part. No more bleeding. Remains off DAPT. Has seen ENT - noted continued  swelling but was released. Last HGB by GI was 9.5. His dose of iron was cut back by GI - but wife has continued to give him 2 a day. Lots of GI issues with the iron. Still with lots of back pain - to see neurosurgery next month. Reminded that he is not a surgical candidate given his medical issues.   Past Medical History:  Diagnosis Date  . AAA (abdominal aortic aneurysm) ([place])   . Anxiety   . CHF (congestive heart failure) ([place])   . COPD (chronic obstructive pulmonary disease) ([place])   . Coronary artery disease   . GERD (gastroesophageal reflux disease)   . History of blood transfusion   . Hyperlipidemia   . Hypertension    takes meds daily  . Hyponatremia   . Lumbar degenerative disc disease   . Myocardial infarction ([place])   . PAD (peripheral  artery disease) ([place])   . PONV (postoperative nausea and vomiting)   . Raynaud's disease /phenomenon   . Stroke [facility]) 10/15/2005    Past Surgical History:  Procedure Laterality Date  . ABDOMINAL ANGIOGRAM  03/30/2012  . CARDIAC CATHETERIZATION  2006  . CAROTID ENDARTERECTOMY     bilateral  . CAROTID ENDARTERECTOMY  04/13/2005   Right and 06/03/2005 Left  . HERNIA REPAIR    . ILIAC ARTERY STENT  10/15/2005    Left common and external      Medications: Current Meds  Medication Sig  . Ascorbic Acid (VITAMIN C) 100 MG tablet Take 100 mg by mouth daily.  . carvedilol (COREG) 25 MG tablet Take 1 tablet (25 mg total) by mouth 2 (two) times daily.  . clobetasol ointment (TEMOVATE) 0.05 % apply to affected area twice a day if needed  . cloNIDine (CATAPRES) 0.2 MG tablet Take 1 tablet (0.2 mg total) by mouth 2 (two) times daily.  . ferrous sulfate 325 (65 FE) MG EC tablet Take 325 mg by mouth daily with breakfast.  . folic acid (FOLVITE) 1 MG tablet Take 1 tablet (1 mg total) by mouth daily.  . furosemide (LASIX) 40 MG tablet Take 0.5  Tablet (20 mg) by mouth daily. May take and additional 0.5 tablet (20 mg) if needed for swelling.  Marland Kitchen lisinopril (PRINIVIL,ZESTRIL) 20 MG tablet Take 1 tablet (20 mg total) by mouth daily.  Marland Kitchen NITROSTAT 0.4 MG SL tablet Place 1 tablet (0.4 mg total) under the tongue every 5 (five) minutes as needed. Chest pain  . pantoprazole (PROTONIX) 40 MG tablet Take 40 mg by mouth daily.  . ranitidine (ZANTAC) 300 MG tablet take 1 tablet by mouth at bedtime  . rosuvastatin (CRESTOR) 20 MG tablet Take 1 tablet (20 mg total) by mouth at bedtime.  . Thiamine HCl (VITAMIN B-1) 100 MG tablet Take 100 mg by mouth daily.    . traMADol (ULTRAM) 50 MG tablet Take 1 tablet (50 mg total) by mouth every 6 (six) hours as needed.  . triamcinolone ointment (KENALOG) 0.1 % Apply 1 application topically 2 (two) times daily.     Allergies: Allergies  Allergen Reactions  . Hctz  [Hydrochlorothiazide] Other (See Comments)    Black out due to electrolytes  . Norvasc [Amlodipine Besylate] Swelling       . Pletal [Cilostazol] Diarrhea  . Heparin Other (See Comments)    HIT positive as of 12/31/14    Social History: The patient  reports that he quit smoking about 11 years ago. His smoking use included cigarettes. he has never used  smokeless tobacco. He reports that he drinks about 14.4 oz of alcohol per week. He reports that he does not use drugs.   Family History: The patient's family history includes Coronary artery disease (age of onset: 41) in his father; Dementia in his mother; Heart disease in his brother and father; Hyperlipidemia in his brother and father; Hypertension in his brother and father; Other (age of onset: 60) in his mother; Peripheral vascular disease in his father.   Review of Systems: Please see the history of present illness.   Otherwise, the review of systems is positive for none.   All other systems are reviewed and negative.   Physical Exam: VS:  BP 110/80   Pulse 68   Ht 6' (1.829 m)   Wt 200 lb 6.4 oz (90.9 kg)   BMI 27.18 kg/m  .  BMI Body mass index is 27.18 kg/m.  Wt Readings from Last 3 Encounters:  03/30/17 200 lb 6.4 oz (90.9 kg)  03/07/17 203 lb (92.1 kg)  03/01/17 197 lb 5 oz (89.5 kg)    General: Pleasant. Chronically ill but alert and in no acute distress. He looks better today.   HEENT: Normal.  Neck: Supple, no JVD, carotid bruits, or masses noted.  Cardiac: Regular rate and rhythm. No murmurs, rubs, or gallops. No significant edema.  Respiratory:  Lungs are clear to auscultation bilaterally with normal work of breathing.  GI: Soft and nontender.  MS: No deformity or atrophy. Gait and ROM intact.  Skin: Warm and dry. Color remains sallow.  Neuro:  Strength and sensation are intact and no gross focal deficits noted.  Psych: Alert, appropriate and with normal affect.   LABORATORY DATA:  EKG:  EKG is not ordered  today.  Lab Results  Component Value Date   WBC 5.5 03/07/2017   HGB 8.4 (L) 03/07/2017   HCT 24.4 (L) 03/07/2017   PLT 195 03/07/2017   GLUCOSE 86 03/07/2017   CHOL 111 01/25/2017   TRIG 50 01/25/2017   HDL 58 01/25/2017   LDLCALC 43 01/25/2017   ALT 13 (L) 02/26/2017   AST 19 02/26/2017   NA 134 03/07/2017   K 5.2 03/07/2017   CL 99 03/07/2017   CREATININE 1.67 (H) 03/07/2017   BUN 23 03/07/2017   CO2 21 03/07/2017   TSH 0.60 08/05/2015   INR 0.98 02/25/2017   HGBA1C 5.4 12/12/2014        BNP (last 3 results) Recent Labs    05/31/16 0950  BNP 605.7*    ProBNP (last 3 results) No results for input(s): PROBNP in the last 8760 hours.   Other Studies Reviewed Today:  Limted CT 06/23/2016 Impressions    --Limited study in the absence of IV contrast.    --Mild interval enlargement of the infrarenal abdominal aneurysm measuring6.2 x 5.8 cm (previously 5.8 x 5.4 cm) .     --Stable left common iliac aneurysm.    --Moderate to severe atherosclerotic changes, as above, with patency and degree of stenosis unclear due to lack of contrast.    --Umbilical hernia including loop of bowel with no clear overlying skin, likely an ostomy however unclear by CT.        TEE Study Conclusions from 11/2014  - Left ventricle: Systolic function was mildly reduced. The  estimated ejection fraction was in the range of 45% to 50%.  Hypokinesis of the inferior myocardium. - Aortic valve: No evidence of vegetation. There was trivial  regurgitation. - Mitral valve: Systolic bowing  without prolapse. There was mild  regurgitation. - Left atrium: No evidence of thrombus in the atrial cavity or  appendage. No evidence of thrombus in the appendage. - Atrial septum: No defect or patent foramen ovale was identified. - Tricuspid valve: No evidence of vegetation.   Assessment/Plan:  1. Episode of Epistaxis - required prior admission as well as transfusion -  remains off DAPT - last HGB up to 9.5. Iron levels have improved. Was to cut back to one iron tablet a day.   2. HTN - still with lots of lability - still wonder how much RAS could be playing a role here - BP higher in the left arm - BP looks fine here today by me - I have left him on his current regimen. No changes made today.   3. Chronic systolic HF- seems ok - but always an issue. Not using Lasix at this time but has on hand - she typically gives based on symptoms rather than weight.   4.  Prior RLE DVT and PE - treated with 6 months of Coumadin - this was stopped March 1st, 2017. Did have + HIT panel. No longer seeing Dr. Alen Blew.   5. Thrombocytopenia  - Heparin induced clinically and HIT Ab panel positive - Serotonin release assay positive as well and hence HIT confirmed - Heparin is listed as an allergy for him  6. CAD with prior NSTEMI/S/P CABG x 5 (12/13/14) - Continue beta blocker, crestor. Now off Plavix as well as his aspirin. No symptoms of angina.   7.  PAD  -followed byDr. Sammuel Hines but no plans to intervene.   8. AAA  -Very complex and due to degree of calcification has been deemed not electively operable - has gotten back to see Dr. Sammuel Hines. No plans for surgical intervention at this time.   9. Umbilical hernia - no plans to intervene on.       10 Anemia - iron studies have improved. Ok to cut back iron to one a day as per GI. Would continue to hold on DAPT until his hemoglobin returns to normal. Also reported with continued swelling in the nose. No active bleeding at this time. May consider resuming aspirin at some point in the future.   11. Back pain - seeing Dr. Ellene Route - he is not a surgical candidate.    Current medicines are reviewed with the patient today.  The patient does not have concerns regarding medicines other than what has been noted above.  The following changes have been made:  See above.  Labs/ tests ordered today include:   No orders  of the defined types were placed in this encounter.    Disposition:   FU with me in 3 months. Will check lab on return.    Patient is agreeable to this plan and will call if any problems develop in the interim.   SignedTruitt Merle, NP  03/30/2017 8:33 AM  [facility] [address] [place], [place]  [address] Phone: [phone] Fax: [phone]

## 2017-03-30 NOTE — Patient Instructions (Addendum)
We will be checking the following labs today - NONE   Medication Instructions:    Continue with your current medicines. BUT  Ok to cut the iron to just one a day    Testing/Procedures To Be Arranged:  N/A  Follow-Up:   See me in 3 months with labs.     Other Special Instructions:   N/A    If you need a refill on your cardiac medications before your next appointment, please call your pharmacy.   Call the [place] office at [phone] if you have any questions, problems or concerns.

## 2017-04-18 ENCOUNTER — Telehealth: Payer: Self-pay | Admitting: Nurse Practitioner

## 2017-04-18 MED ORDER — CARVEDILOL 25 MG PO TABS: ORAL_TABLET | ORAL | 11 refills | Status: DC

## 2017-04-18 NOTE — Telephone Encounter (Signed)
Ok to be on 37.5 mg of Coreg in the Am and 25 mg in the PM Continue to monitor and cut back to 25 mg BID if BP starts dropping.   Will see back as planned.

## 2017-04-18 NOTE — Telephone Encounter (Signed)
Called and made wife aware that Truitt Merle was okay with the patient taking 37.5 mg in the AM and 25 mg in the PM. Instructed her to continue to monitor BP and if it gets too low she should go back to 25 mg BID. She verbalized understanding and thanked me for the call. New Rx sent per request.

## 2017-04-18 NOTE — Telephone Encounter (Signed)
New message  Patient wife calling to inform of medication change. States she increased patients carvedilol (COREG) 25 MG tablet. States while on vacation she started giving  him a pill and 1/2 because his blood pressure was 180/82?. States his pressure is better now.  Wants to know if this should be the new frequency. Please call    Pt c/o swelling: STAT is pt has developed SOB within 24 hours  1) How much weight have you gained and in what time span? N/A  2) If swelling, where is the swelling located?  Both legs, started 2 days ago  3) Are you currently taking a fluid pill? Yes, Lasix  4) Are you currently SOB? NO  5) Do you have a log of your daily weights (if so, list)? N/A  6) Have you gained 3 pounds in a day or 5 pounds in a week? N/A  7) Have you traveled recently? *NO

## 2017-04-18 NOTE — Telephone Encounter (Signed)
Patient's wife calling and states that while they were [place] vacation at the beach her husband's BP was elevated at 180/86. She states that he usually takes coreg 25 mg BID, clonidine 0.2 mg BID, and lisinopril 20 mg QD. She states that she started giving him an extra 1/2 tablet (12.5 mg) of coreg in the morning only and states that his BP is better at 130s/70-80s. She is wanting to know if she should continue doing this. She did not have HR readings. She states that the patient is completely asymptomatic other than having swelling in his legs for which she has given 40 mg of lasix x1 with some improvement. She states that the patient is not SOB and has not had any weight gain. Advised for patient to elevate legs and avoid salt in his diet. Made wife aware that the information would be forwarded to Truitt Merle, NP for review and recommendation on coreg dosing.

## 2017-04-19 ENCOUNTER — Other Ambulatory Visit: Payer: Self-pay

## 2017-04-19 MED ORDER — RANITIDINE HCL 300 MG PO TABS: 300.0000 mg | ORAL_TABLET | Freq: Every day | ORAL | 11 refills | Status: DC

## 2017-05-12 DIAGNOSIS — M48062 Spinal stenosis, lumbar region with neurogenic claudication: Secondary | ICD-10-CM | POA: Diagnosis not present

## 2017-05-12 DIAGNOSIS — M4316 Spondylolisthesis, lumbar region: Secondary | ICD-10-CM | POA: Diagnosis not present

## 2017-05-12 DIAGNOSIS — M47816 Spondylosis without myelopathy or radiculopathy, lumbar region: Secondary | ICD-10-CM | POA: Diagnosis not present

## 2017-06-06 DIAGNOSIS — I719 Aortic aneurysm of unspecified site, without rupture: Secondary | ICD-10-CM | POA: Diagnosis not present

## 2017-06-06 DIAGNOSIS — I10 Essential (primary) hypertension: Secondary | ICD-10-CM | POA: Diagnosis not present

## 2017-06-06 DIAGNOSIS — K429 Umbilical hernia without obstruction or gangrene: Secondary | ICD-10-CM | POA: Diagnosis not present

## 2017-06-10 DIAGNOSIS — M48061 Spinal stenosis, lumbar region without neurogenic claudication: Secondary | ICD-10-CM | POA: Diagnosis not present

## 2017-06-10 DIAGNOSIS — M5136 Other intervertebral disc degeneration, lumbar region: Secondary | ICD-10-CM | POA: Diagnosis not present

## 2017-06-10 DIAGNOSIS — M5416 Radiculopathy, lumbar region: Secondary | ICD-10-CM | POA: Diagnosis not present

## 2017-06-10 DIAGNOSIS — M4726 Other spondylosis with radiculopathy, lumbar region: Secondary | ICD-10-CM | POA: Diagnosis not present

## 2017-06-22 DIAGNOSIS — Z86718 Personal history of other venous thrombosis and embolism: Secondary | ICD-10-CM | POA: Diagnosis not present

## 2017-06-22 DIAGNOSIS — I714 Abdominal aortic aneurysm, without rupture: Secondary | ICD-10-CM | POA: Diagnosis not present

## 2017-06-22 DIAGNOSIS — I10 Essential (primary) hypertension: Secondary | ICD-10-CM | POA: Diagnosis not present

## 2017-06-22 DIAGNOSIS — I779 Disorder of arteries and arterioles, unspecified: Secondary | ICD-10-CM | POA: Diagnosis not present

## 2017-06-22 DIAGNOSIS — K429 Umbilical hernia without obstruction or gangrene: Secondary | ICD-10-CM | POA: Diagnosis not present

## 2017-06-23 DIAGNOSIS — K429 Umbilical hernia without obstruction or gangrene: Secondary | ICD-10-CM | POA: Diagnosis not present

## 2017-06-23 DIAGNOSIS — D649 Anemia, unspecified: Secondary | ICD-10-CM | POA: Diagnosis not present

## 2017-06-23 DIAGNOSIS — R195 Other fecal abnormalities: Secondary | ICD-10-CM | POA: Diagnosis not present

## 2017-06-28 NOTE — Progress Notes (Signed)
CARDIOLOGY OFFICE NOTE  Date:  06/29/2017    [name] Date of Birth: March 07, 1951 Medical Record #[id number]  PCP:  Alroy Dust, L.Marlou Sa, MD  Cardiologist:  Jerel Shepherd    Chief Complaint  Patient presents with  . Coronary Artery Disease  . Hypertension  . Hyperlipidemia  . PAD    3 month check - seen for Dr. Burt Knack    History of Present Illness: YALE GOLLA is a 67 y.o. male who presents today for a 3 month check. Seen for Dr. Burt Knack. Former patient of Dr. Susa Simmonds.   He has an extensive history of PVD and CAD. He was previously followed by Dr. Bridgett Larsson at [facility] but now seeing Vascular in [facility] with Dr. Sammuel Hines. Prior cath in 2006 showing normal LV function with a totally occluded RCA with left to right collaterals. He has distal small vessel disease in the RCA with diffuse mild to moderate stenosis in the LAD. Former smoker. His PVD history is quite extensive and includes AAA (with no plans to intervene due to the nature of his aneurysm and his multiple comorbidities - noted scan was 5.8 x 5.4 from 02/2015).   Other problems include HTN, HLD and OA. He has a history of hyponatremia as well.He had CABG x 5 by Dr. Cyndia Bent back in 3500 - complicated by DVT and volume overload.Was on IV heparin, transitioned to Xarelto due to drop in platelets, HIT panel + and serotonin release assay + as well and he was changed to Arixtra and seen by Dr. Alen Blew. Warfarin started and he wason 6 months of therapyand then transitioned back to Plavix..   I have seen him several times over the past few years. He has had intermittent issues with his BP. Has had intermittent issues with volume overload. He has gone back to excessive alcohol use. He had not planned on going back to [facility] but a CT by ortho here showed the aneurysm - referred back to [facility]. There is no plan for surgical intervention.  Seen back in early September - she had had shoulder surgery. He had had a fall. Had been found  to have blood in his stool - saw GI - placedon PPI therapy, iron and Vit C.He was to have arepeat stool test.Cardiac status was ok. BP was up a little but they followed it at home and called in with a diary which was improved.   Admitted back in October with significant epistaxis - actually had to be transfused. ASA/Plavix stopped. BP was pretty high and he required several med changes.   Called almost immediately after that discharge - BP super low and we had to start cutting back.I then saw him in October - he was doing ok. No further bleeding. Off his iron due to constipation. Repeat stool cards were pending. BP were quite labile - sky high here. We have continued to make medicine adjustments.   Last visit was back in November - seemed to be leveling out. HGB up to 9.5. No bleeding. Was going to see neurosurgery about his back - he knows he is not a surgical candidate.   Comes in today. Here withhis wife. He has seen Dr. Ellene Route for his back - to continue with epidural injections due to his high operative risk. Has seen Dr. Greer Pickerel for his hernia - apparently there is to be a discussion with Dr. Sammuel Hines and Dr. Burt Knack about how to proceed - but Rawad and his wife know that  he is very high risk and do not plan to intervene and they are going to try a girdle.   Has also seen GI - HGB 10 and had no blood in his stool. To stay on iron for another month. She notes he "cannot stop eating" and he has gained a significant amount of weight - up 25 pounds over the last 3 months. He has been off and on of Lasix. Lots of swelling. More short of breath. BP has been ok. Eating and drinking pretty much what ever he wants. Fortunately, no chest pain.   Past Medical History:  Diagnosis Date  . AAA (abdominal aortic aneurysm) ([place])   . Anxiety   . CHF (congestive heart failure) ([place])   . COPD (chronic obstructive pulmonary disease) ([place])   . Coronary artery disease   . GERD (gastroesophageal reflux  disease)   . History of blood transfusion   . Hyperlipidemia   . Hypertension    takes meds daily  . Hyponatremia   . Lumbar degenerative disc disease   . Myocardial infarction ([place])   . PAD (peripheral artery disease) ([place])   . PONV (postoperative nausea and vomiting)   . Raynaud's disease /phenomenon   . Stroke [facility]) 10/15/2005    Past Surgical History:  Procedure Laterality Date  . ABDOMINAL ANGIOGRAM  03/30/2012  . ABDOMINAL AORTAGRAM N/A 03/30/2012   Procedure: ABDOMINAL Maxcine Ham;  Surgeon: Conrad Piatt, MD;  Location: [facility] CATH LAB;  Service: Cardiovascular;  Laterality: N/A;  . CARDIAC CATHETERIZATION  2006  . CARDIAC CATHETERIZATION N/A 12/09/2014   Procedure: Left Heart Cath and Coronary Angiography;  Surgeon: [name], MD;  Location: [facility] CV LAB;  Service: Cardiovascular;  Laterality: N/A;  . CAROTID ENDARTERECTOMY     bilateral  . CAROTID ENDARTERECTOMY  04/13/2005   Right and 06/03/2005 Left  . CORONARY ARTERY BYPASS GRAFT N/A 12/13/2014   Procedure: CORONARY ARTERY BYPASS GRAFT times five using left internal mammary artery and endoscopic right leg saphenous vein harvest;  Surgeon: Gaye Pollack, MD;  Location: [facility] OR;  Service: Open Heart Surgery;  Laterality: N/A;  . HERNIA REPAIR    . ILIAC ARTERY STENT  10/15/2005    Left common and external   . INTRAOPERATIVE TRANSESOPHAGEAL ECHOCARDIOGRAM N/A 12/13/2014   Procedure: INTRAOPERATIVE TRANSESOPHAGEAL ECHOCARDIOGRAM;  Surgeon: Gaye Pollack, MD;  Location: [facility];  Service: Open Heart Surgery;  Laterality: N/A;     Medications: Current Meds  Medication Sig  . Ascorbic Acid (VITAMIN C) 100 MG tablet Take 100 mg by mouth daily.  . carvedilol (COREG) 25 MG tablet Take 37.5 mg (1.5 tablets) in the morning and take 25 mg (1 tablet) in the afternoon.  . clobetasol ointment (TEMOVATE) 0.05 % apply to affected area twice a day if needed  . cloNIDine (CATAPRES) 0.2 MG tablet Take 1 tablet (0.2 mg total) by mouth 2  (two) times daily.  . ferrous sulfate 325 (65 FE) MG EC tablet Take 325 mg by mouth daily with breakfast.  . folic acid (FOLVITE) 1 MG tablet Take 1 tablet (1 mg total) by mouth daily.  . furosemide (LASIX) 40 MG tablet Take 1 tablet (40 mg total) by mouth daily as needed for fluid.  Marland Kitchen lisinopril (PRINIVIL,ZESTRIL) 20 MG tablet Take 1 tablet (20 mg total) by mouth daily.  Marland Kitchen NITROSTAT 0.4 MG SL tablet Place 1 tablet (0.4 mg total) under the tongue every 5 (five) minutes as needed. Chest pain  . pantoprazole (PROTONIX) 40  MG tablet Take 40 mg by mouth daily.  . ranitidine (ZANTAC) 300 MG tablet Take 1 tablet (300 mg total) by mouth at bedtime.  . rosuvastatin (CRESTOR) 20 MG tablet Take 1 tablet (20 mg total) by mouth at bedtime.  . Thiamine HCl (VITAMIN B-1) 100 MG tablet Take 100 mg by mouth daily.    . traMADol (ULTRAM) 50 MG tablet Take 1 tablet (50 mg total) by mouth every 6 (six) hours as needed.  . triamcinolone ointment (KENALOG) 0.1 % Apply 1 application topically 2 (two) times daily.  . [DISCONTINUED] furosemide (LASIX) 40 MG tablet Take 0.5  Tablet (20 mg) by mouth daily. May take and additional 0.5 tablet (20 mg) if needed for swelling.     Allergies: Allergies  Allergen Reactions  . Hctz [Hydrochlorothiazide] Other (See Comments)    Black out due to electrolytes  . Norvasc [Amlodipine Besylate] Swelling       . Pletal [Cilostazol] Diarrhea  . Heparin Other (See Comments)    HIT positive as of 12/31/14    Social History: The patient  reports that he quit smoking about 12 years ago. His smoking use included cigarettes. he has never used smokeless tobacco. He reports that he drinks about 14.4 oz of alcohol per week. He reports that he does not use drugs.   Family History: The patient's family history includes Coronary artery disease (age of onset: 35) in his father; Dementia in his mother; Heart disease in his brother and father; Hyperlipidemia in his brother and father;  Hypertension in his brother and father; Other (age of onset: 17) in his mother; Peripheral vascular disease in his father.   Review of Systems: Please see the history of present illness.   Otherwise, the review of systems is positive for none.   All other systems are reviewed and negative.   Physical Exam: VS:  BP 110/60 (BP Location: Left Arm, Patient Position: Sitting, Cuff Size: Normal)   Pulse (!) 58   Ht 6' (1.829 m)   Wt 225 lb 12.8 oz (102.4 kg)   SpO2 100% Comment: at rest  BMI 30.62 kg/m  .  BMI Body mass index is 30.62 kg/m.  Wt Readings from Last 3 Encounters:  06/29/17 225 lb 12.8 oz (102.4 kg)  03/30/17 200 lb 6.4 oz (90.9 kg)  03/07/17 203 lb (92.1 kg)    General: Pleasant. Chronically ill appearing. Color is quite sallow. He is alert and in no acute distress.   HEENT: Normal.  Neck: Supple, no JVD, carotid bruits, or masses noted.  Cardiac: Regular rate and rhythm. No murmurs, rubs, or gallops. Over 2+ edema.  Respiratory:  Lungs are clear to auscultation bilaterally with normal work of breathing.  GI: Soft and nontender. Large hernia noted.  MS: No deformity or atrophy. Gait and ROM intact.  Skin: Warm and dry. Color is normal.  Neuro:  Strength and sensation are intact and no gross focal deficits noted.  Psych: Alert, appropriate and with normal affect.   LABORATORY DATA:  EKG:  EKG is not ordered today.  Lab Results  Component Value Date   WBC 5.5 03/07/2017   HGB 8.4 (L) 03/07/2017   HCT 24.4 (L) 03/07/2017   PLT 195 03/07/2017   GLUCOSE 86 03/07/2017   CHOL 111 01/25/2017   TRIG 50 01/25/2017   HDL 58 01/25/2017   LDLCALC 43 01/25/2017   ALT 13 (L) 02/26/2017   AST 19 02/26/2017   NA 134 03/07/2017   K 5.2 03/07/2017  CL 99 03/07/2017   CREATININE 1.67 (H) 03/07/2017   BUN 23 03/07/2017   CO2 21 03/07/2017   TSH 0.60 08/05/2015   INR 0.98 02/25/2017   HGBA1C 5.4 12/12/2014     BNP (last 3 results) No results for input(s): BNP in the  last 8760 hours.  ProBNP (last 3 results) No results for input(s): PROBNP in the last 8760 hours.   Other Studies Reviewed Today:  Limted CT 06/23/2016 Impressions    --Limited study in the absence of IV contrast.    --Mild interval enlargement of the infrarenal abdominal aneurysm measuring6.2 x 5.8 cm (previously 5.8 x 5.4 cm) .     --Stable left common iliac aneurysm.    --Moderate to severe atherosclerotic changes, as above, with patency and degree of stenosis unclear due to lack of contrast.    --Umbilical hernia including loop of bowel with no clear overlying skin, likely an ostomy however unclear by CT.        TEE Study Conclusions from 11/2014  - Left ventricle: Systolic function was mildly reduced. The  estimated ejection fraction was in the range of 45% to 50%.  Hypokinesis of the inferior myocardium. - Aortic valve: No evidence of vegetation. There was trivial  regurgitation. - Mitral valve: Systolic bowing without prolapse. There was mild  regurgitation. - Left atrium: No evidence of thrombus in the atrial cavity or  appendage. No evidence of thrombus in the appendage. - Atrial septum: No defect or patent foramen ovale was identified. - Tricuspid valve: No evidence of vegetation.   Assessment/Plan:  1. Anemia - very slow to recover. May need to consider going back to hematology. ?IV iron. Fortunately no active bleeding.   2. Prior Episode of Epistaxis- required prior admission as well as transfusion - remains off DAPT - remains on iron. Off Plavix.   3.HTN - BP ok on current regimen.   4. Chronic systolic HF- his weight has escalated - his swelling has progressed - she has just put him on Lasix 40 mg - would continue. Lab today.   5. Prior RLE DVT and PE - treated with 6 months of Coumadin - this was stopped March 1st, 2017. Did have + HIT panel. No longer seeing Dr. Alen Blew but may need to consider.   6. Thrombocytopenia    - Heparin induced clinically and HIT Ab panel positive - Serotonin release assay positive as well and hence HIT confirmed - Heparin is listed as an allergy for him  7. CAD with priorNSTEMI/S/P CABG x 5 (12/13/14) - Continue beta blocker, crestor.Remains offPlavix as well as his aspirin. No symptoms of angina.   8.  PAD  -followed byDr. Gloris Ham no plans to intervene.  9. AAA  -Very complex and due to degree of calcification has been deemed not electively operable - has gotten back to see Dr. Sammuel Hines. No plans for surgical intervention at this time.   10. Umbilical hernia - has seen Dr. Redmond Pulling. The patient does not have plans to intervene surgically due to being high risk.        11. Back pain - seeing Dr. Ellene Route - he is not a surgical candidate. To continue with epidural injections.     Current medicines are reviewed with the patient today.  The patient does not have concerns regarding medicines other than what has been noted above.  The following changes have been made:  See above.  Labs/ tests ordered today include:    Orders Placed This Encounter  Procedures  . Basic metabolic panel  . CBC  . Hepatic function panel  . Lipid panel  . Pro b natriuretic peptide (BNP)     Disposition:   FU with me in 3 months.   Patient is agreeable to this plan and will call if any problems develop in the interim.   SignedTruitt Merle, NP  06/29/2017 8:38 AM  [facility] [address] [place], [place]  [address] Phone: [phone] Fax: [phone]

## 2017-06-29 ENCOUNTER — Telehealth: Payer: Self-pay | Admitting: *Deleted

## 2017-06-29 ENCOUNTER — Ambulatory Visit (INDEPENDENT_AMBULATORY_CARE_PROVIDER_SITE_OTHER): Payer: Medicare Other | Admitting: Nurse Practitioner

## 2017-06-29 ENCOUNTER — Encounter (INDEPENDENT_AMBULATORY_CARE_PROVIDER_SITE_OTHER): Payer: Self-pay

## 2017-06-29 ENCOUNTER — Encounter: Payer: Self-pay | Admitting: Nurse Practitioner

## 2017-06-29 VITALS — BP 110/60 | HR 58 | Ht 72.0 in | Wt 225.8 lb

## 2017-06-29 DIAGNOSIS — I5022 Chronic systolic (congestive) heart failure: Secondary | ICD-10-CM

## 2017-06-29 DIAGNOSIS — I739 Peripheral vascular disease, unspecified: Secondary | ICD-10-CM

## 2017-06-29 DIAGNOSIS — R0602 Shortness of breath: Secondary | ICD-10-CM | POA: Diagnosis not present

## 2017-06-29 DIAGNOSIS — I25810 Atherosclerosis of coronary artery bypass graft(s) without angina pectoris: Secondary | ICD-10-CM

## 2017-06-29 DIAGNOSIS — I10 Essential (primary) hypertension: Secondary | ICD-10-CM | POA: Diagnosis not present

## 2017-06-29 DIAGNOSIS — Z951 Presence of aortocoronary bypass graft: Secondary | ICD-10-CM | POA: Diagnosis not present

## 2017-06-29 LAB — BASIC METABOLIC PANEL
BUN/Creatinine Ratio: 27 — ABNORMAL HIGH (ref 10–24)
BUN: 40 mg/dL — ABNORMAL HIGH (ref 8–27)
CO2: 22 mmol/L (ref 20–29)
Calcium: 8.9 mg/dL (ref 8.6–10.2)
Chloride: 97 mmol/L (ref 96–106)
Creatinine, Ser: 1.5 mg/dL — ABNORMAL HIGH (ref 0.76–1.27)
GFR calc Af Amer: 55 mL/min/{1.73_m2} — ABNORMAL LOW (ref >59)
GFR calc non Af Amer: 47 mL/min/{1.73_m2} — ABNORMAL LOW (ref >59)
Glucose: 108 mg/dL — ABNORMAL HIGH (ref 65–99)
Potassium: 4.6 mmol/L (ref 3.5–5.2)
Sodium: 132 mmol/L — ABNORMAL LOW (ref 134–144)

## 2017-06-29 LAB — CBC
Hematocrit: 27.3 % — ABNORMAL LOW (ref 37.5–51.0)
Hemoglobin: 9.4 g/dL — ABNORMAL LOW (ref 13.0–17.7)
MCH: 34.2 pg — ABNORMAL HIGH (ref 26.6–33.0)
MCHC: 34.4 g/dL (ref 31.5–35.7)
MCV: 99 fL — ABNORMAL HIGH (ref 79–97)
Platelets: 134 10*3/uL — ABNORMAL LOW (ref 150–379)
RBC: 2.75 x10E6/uL — ABNORMAL LOW (ref 4.14–5.80)
RDW: 13.9 % (ref 12.3–15.4)
WBC: 7.3 10*3/uL (ref 3.4–10.8)

## 2017-06-29 LAB — LIPID PANEL
Chol/HDL Ratio: 1.8 ratio (ref 0.0–5.0)
Cholesterol, Total: 131 mg/dL (ref 100–199)
HDL: 74 mg/dL (ref >39)
LDL Calculated: 52 mg/dL (ref 0–99)
Triglycerides: 27 mg/dL (ref 0–149)
VLDL Cholesterol Cal: 5 mg/dL (ref 5–40)

## 2017-06-29 LAB — HEPATIC FUNCTION PANEL
ALT: 17 IU/L (ref 0–44)
AST: 16 IU/L (ref 0–40)
Albumin: 4.1 g/dL (ref 3.6–4.8)
Alkaline Phosphatase: 44 IU/L (ref 39–117)
Bilirubin Total: 0.8 mg/dL (ref 0.0–1.2)
Bilirubin, Direct: 0.3 mg/dL (ref 0.00–0.40)
Total Protein: 6.3 g/dL (ref 6.0–8.5)

## 2017-06-29 LAB — PRO B NATRIURETIC PEPTIDE: NT-Pro BNP: 5514 pg/mL — ABNORMAL HIGH (ref 0–376)

## 2017-06-29 MED ORDER — FUROSEMIDE 40 MG PO TABS: 40.0000 mg | ORAL_TABLET | Freq: Every day | ORAL | 6 refills | Status: DC | PRN

## 2017-06-29 NOTE — Telephone Encounter (Signed)
Faxing to [facility] @ [phone] phone # is [phone] pt's surgical clearance for hernia.

## 2017-06-29 NOTE — Patient Instructions (Addendum)
We will be checking the following labs today - BMET, CBC, HPF, Lipids, and BNP   Medication Instructions:    Continue with your current medicines.   Stay on the 40 mg of Lasix for the swelling for now.     Testing/Procedures To Be Arranged:  N/A  Follow-Up:   See me in 3 months     Other Special Instructions:   N/A    If you need a refill on your cardiac medications before your next appointment, please call your pharmacy.   Call the [place] office at [phone] if you have any questions, problems or concerns.

## 2017-07-01 ENCOUNTER — Other Ambulatory Visit: Payer: Self-pay | Admitting: *Deleted

## 2017-07-01 DIAGNOSIS — R7989 Other specified abnormal findings of blood chemistry: Secondary | ICD-10-CM

## 2017-07-01 DIAGNOSIS — N170 Acute kidney failure with tubular necrosis: Secondary | ICD-10-CM

## 2017-07-07 ENCOUNTER — Other Ambulatory Visit: Payer: Medicare Other | Admitting: *Deleted

## 2017-07-07 DIAGNOSIS — R0602 Shortness of breath: Secondary | ICD-10-CM | POA: Diagnosis not present

## 2017-07-07 DIAGNOSIS — N170 Acute kidney failure with tubular necrosis: Secondary | ICD-10-CM | POA: Diagnosis not present

## 2017-07-07 DIAGNOSIS — R7989 Other specified abnormal findings of blood chemistry: Secondary | ICD-10-CM | POA: Diagnosis not present

## 2017-07-07 LAB — PRO B NATRIURETIC PEPTIDE: NT-Pro BNP: 3519 pg/mL — ABNORMAL HIGH (ref 0–376)

## 2017-07-07 LAB — BASIC METABOLIC PANEL
BUN/Creatinine Ratio: 29 — ABNORMAL HIGH (ref 10–24)
BUN: 44 mg/dL — ABNORMAL HIGH (ref 8–27)
CO2: 22 mmol/L (ref 20–29)
Calcium: 9.1 mg/dL (ref 8.6–10.2)
Chloride: 95 mmol/L — ABNORMAL LOW (ref 96–106)
Creatinine, Ser: 1.52 mg/dL — ABNORMAL HIGH (ref 0.76–1.27)
GFR calc Af Amer: 53 mL/min/{1.73_m2} — ABNORMAL LOW (ref >59)
GFR calc non Af Amer: 46 mL/min/{1.73_m2} — ABNORMAL LOW (ref >59)
Glucose: 102 mg/dL — ABNORMAL HIGH (ref 65–99)
Potassium: 4.3 mmol/L (ref 3.5–5.2)
Sodium: 134 mmol/L (ref 134–144)

## 2017-07-08 ENCOUNTER — Telehealth: Payer: Self-pay | Admitting: Nurse Practitioner

## 2017-07-08 ENCOUNTER — Other Ambulatory Visit: Payer: Self-pay | Admitting: *Deleted

## 2017-07-08 DIAGNOSIS — N170 Acute kidney failure with tubular necrosis: Secondary | ICD-10-CM

## 2017-07-08 NOTE — Telephone Encounter (Signed)
New message   Pt wife is calling wanting to know what Oncologist is she suppose to make an appt with, the one from 2 years ago or the one the nurse informed her about today. Please call

## 2017-07-08 NOTE — Telephone Encounter (Signed)
The doctor that was discussed at phone call earlier was Dr. Alen Blew.  The pt's wife got confused.

## 2017-07-22 ENCOUNTER — Other Ambulatory Visit: Payer: Medicare Other | Admitting: *Deleted

## 2017-07-22 DIAGNOSIS — N170 Acute kidney failure with tubular necrosis: Secondary | ICD-10-CM

## 2017-07-22 LAB — BASIC METABOLIC PANEL
BUN/Creatinine Ratio: 25 — ABNORMAL HIGH (ref 10–24)
BUN: 41 mg/dL — ABNORMAL HIGH (ref 8–27)
CO2: 22 mmol/L (ref 20–29)
Calcium: 8.9 mg/dL (ref 8.6–10.2)
Chloride: 96 mmol/L (ref 96–106)
Creatinine, Ser: 1.61 mg/dL — ABNORMAL HIGH (ref 0.76–1.27)
GFR calc Af Amer: 50 mL/min/{1.73_m2} — ABNORMAL LOW (ref >59)
GFR calc non Af Amer: 43 mL/min/{1.73_m2} — ABNORMAL LOW (ref >59)
Glucose: 102 mg/dL — ABNORMAL HIGH (ref 65–99)
Potassium: 4.5 mmol/L (ref 3.5–5.2)
Sodium: 132 mmol/L — ABNORMAL LOW (ref 134–144)

## 2017-07-26 ENCOUNTER — Other Ambulatory Visit: Payer: Self-pay | Admitting: *Deleted

## 2017-07-26 DIAGNOSIS — N170 Acute kidney failure with tubular necrosis: Secondary | ICD-10-CM

## 2017-07-27 ENCOUNTER — Telehealth: Payer: Self-pay | Admitting: *Deleted

## 2017-07-27 ENCOUNTER — Other Ambulatory Visit: Payer: Self-pay | Admitting: Oncology

## 2017-07-27 ENCOUNTER — Inpatient Hospital Stay: Payer: Medicare Other | Attending: Oncology | Admitting: Oncology

## 2017-07-27 ENCOUNTER — Telehealth: Payer: Self-pay | Admitting: Oncology

## 2017-07-27 ENCOUNTER — Inpatient Hospital Stay: Payer: Medicare Other

## 2017-07-27 VITALS — BP 122/83 | HR 64 | Temp 98.2°F | Resp 18 | Ht 72.0 in | Wt 215.4 lb

## 2017-07-27 DIAGNOSIS — D7582 Heparin induced thrombocytopenia (HIT): Secondary | ICD-10-CM | POA: Diagnosis not present

## 2017-07-27 DIAGNOSIS — E538 Deficiency of other specified B group vitamins: Secondary | ICD-10-CM

## 2017-07-27 DIAGNOSIS — N189 Chronic kidney disease, unspecified: Secondary | ICD-10-CM | POA: Insufficient documentation

## 2017-07-27 DIAGNOSIS — D649 Anemia, unspecified: Secondary | ICD-10-CM

## 2017-07-27 DIAGNOSIS — D631 Anemia in chronic kidney disease: Secondary | ICD-10-CM | POA: Insufficient documentation

## 2017-07-27 DIAGNOSIS — Z86718 Personal history of other venous thrombosis and embolism: Secondary | ICD-10-CM | POA: Diagnosis not present

## 2017-07-27 DIAGNOSIS — D509 Iron deficiency anemia, unspecified: Secondary | ICD-10-CM | POA: Diagnosis not present

## 2017-07-27 LAB — CBC WITH DIFFERENTIAL (CANCER CENTER ONLY)
Basophils Absolute: 0 10*3/uL (ref 0.0–0.1)
Basophils Relative: 1 %
EOS PCT: 1 %
Eosinophils Absolute: 0.1 10*3/uL (ref 0.0–0.5)
HCT: 27 % — ABNORMAL LOW (ref 38.4–49.9)
Hemoglobin: 9.3 g/dL — ABNORMAL LOW (ref 13.0–17.1)
LYMPHS PCT: 22 %
Lymphs Abs: 1.7 10*3/uL (ref 0.9–3.3)
MCH: 35.5 pg — ABNORMAL HIGH (ref 27.2–33.4)
MCHC: 34.4 g/dL (ref 32.0–36.0)
MCV: 103.1 fL — AB (ref 79.3–98.0)
MONO ABS: 1 10*3/uL — AB (ref 0.1–0.9)
MONOS PCT: 12 %
NEUTROS ABS: 5.1 10*3/uL (ref 1.5–6.5)
Neutrophils Relative %: 64 %
PLATELETS: 27 10*3/uL — AB (ref 140–400)
RBC: 2.62 MIL/uL — ABNORMAL LOW (ref 4.20–5.82)
RDW: 15.5 % — AB (ref 11.0–14.6)
WBC Count: 7.9 10*3/uL (ref 4.0–10.3)

## 2017-07-27 LAB — IRON AND TIBC
IRON: 105 ug/dL (ref 42–163)
Saturation Ratios: 40 % — ABNORMAL LOW (ref 42–163)
TIBC: 264 ug/dL (ref 202–409)
UIBC: 159 ug/dL

## 2017-07-27 LAB — FERRITIN: Ferritin: 262 ng/mL (ref 22–316)

## 2017-07-27 NOTE — Telephone Encounter (Signed)
-----   Message from Wyatt Portela, MD sent at 07/27/2017  1:46 PM EST ----- Please let him know he will not need IV iron Just keep iron pill for now.

## 2017-07-27 NOTE — Telephone Encounter (Signed)
Scheduled appt per 3/6 los - Gave patient AVS and calender per los.  

## 2017-07-27 NOTE — Telephone Encounter (Signed)
As noted below by Dr. Alen Blew, I informed the wife of iron levels. He doesn't need IV iron, but continue taking the iron pill for now. Wife verbalized understanding.

## 2017-07-27 NOTE — Progress Notes (Signed)
Hematology and Oncology Follow Up Visit  [name] [id number] 1950/09/19 67 y.o. 07/27/2017 11:38 AM Alroy Dust, L.Dean, MDMitchell, L.Marlou Sa, MD   Principle Diagnosis: 67 year old gentleman with:  1.  Heparin-induced thrombocytopenia diagnosed in August 2016. He presented with deep vein thrombosis and received heparin and platelet count dropped to 60,000. He subsequently developed a pulmonary embolism. No recurrent thrombocytopenia noted.  2. Multifactorial anemia diagnosed in October 2018. He has an element of Iron deficiency in addition to renal insufficiency and chronic disease.   Current therapy:   Warfarin started in August 2016. This was discontinued in October 2018 after developing GI bleed. He is off anticoagulation at this time.  Oral iron therapy once a day started in October 2018.  Interim History:  [name] presents today for a follow-up. He is a pleasant gentleman I saw in the past for heparin-induced thrombocytopenia. He has been in his usual state of health until he presented with hypertension and epistaxis in October 2019. Due to his anticoagulation, he developed excessive bleeding that required packed cell transfusion for hemoglobin of 6.8. After his transfusion he is hemoglobin was up to 8.4 in October 2018.  A repeat CBC on 06/29/2017 showed a hemoglobin of 9.4. His MCV is 99 RDW 13.9. He was referred to me for evaluation regarding his anemia. He does report symptoms of fatigue and tiredness that has been chronic in nature. He is no longer reporting any epistaxis, hematochezia or melena.  He does not report any headaches, blurry vision, syncope or seizures. He is not report any fevers, chills or sweats. Does not report any chest pain, palpitation orthopnea. Does not report any cough or hemoptysis or wheezing. Does not report any nausea, vomiting or abdominal pain. Does not report any skeletal complaints. Does not report any skin rashes or lesions. He is not report any  lymphadenopathy or petechiae. Remaining review of systems is negative.   Medications: I have reviewed the patient's current medications.  Current Outpatient Medications  Medication Sig Dispense Refill  . Ascorbic Acid (VITAMIN C) 100 MG tablet Take 100 mg by mouth daily.    . carvedilol (COREG) 25 MG tablet Take 37.5 mg (1.5 tablets) in the morning and take 25 mg (1 tablet) in the afternoon. 75 tablet 11  . clobetasol ointment (TEMOVATE) 0.05 % apply to affected area twice a day if needed  0  . cloNIDine (CATAPRES) 0.2 MG tablet Take 1 tablet (0.2 mg total) by mouth 2 (two) times daily. 60 tablet 11  . ferrous sulfate 325 (65 FE) MG EC tablet Take 325 mg by mouth daily with breakfast.    . folic acid (FOLVITE) 1 MG tablet Take 1 tablet (1 mg total) by mouth daily. 30 tablet 0  . furosemide (LASIX) 40 MG tablet Take 1 tablet (40 mg total) by mouth daily as needed for fluid. 30 tablet 6  . lisinopril (PRINIVIL,ZESTRIL) 20 MG tablet Take 1 tablet (20 mg total) by mouth daily. 30 tablet 11  . pantoprazole (PROTONIX) 40 MG tablet Take 40 mg by mouth daily.  0  . ranitidine (ZANTAC) 300 MG tablet Take 1 tablet (300 mg total) by mouth at bedtime. 30 tablet 11  . rosuvastatin (CRESTOR) 20 MG tablet Take 1 tablet (20 mg total) by mouth at bedtime. 90 tablet 2  . Thiamine HCl (VITAMIN B-1) 100 MG tablet Take 100 mg by mouth daily.      . traMADol (ULTRAM) 50 MG tablet Take 1 tablet (50 mg total) by mouth every  6 (six) hours as needed. 30 tablet 0  . triamcinolone ointment (KENALOG) 0.1 % Apply 1 application topically 2 (two) times daily. 80 g 1  . NITROSTAT 0.4 MG SL tablet Place 1 tablet (0.4 mg total) under the tongue every 5 (five) minutes as needed. Chest pain (Patient not taking: Reported on 07/27/2017) 25 tablet 3   No current facility-administered medications for this visit.      Allergies:  Allergies  Allergen Reactions  . Hctz [Hydrochlorothiazide] Other (See Comments)    Black out due to  electrolytes  . Heparin Other (See Comments)    HIT positive as of 12/31/14  . Norvasc [Amlodipine Besylate] Swelling       . Pletal [Cilostazol] Diarrhea    Past Medical History, Surgical history, Social history, and Family History were reviewed and updated.  Physical Exam: Blood pressure 122/83, pulse 64, temperature 98.2 F (36.8 C), temperature source Oral, resp. rate 18, height 6' (1.829 m), weight 215 lb 6.4 oz (97.7 kg), SpO2 98 %. ECOG: 1 General appearance: Comfortable-appearing gentleman appeared without distress. Head: Normocephalic, without obvious abnormality  Oral mucosa: Without any thrush or ulcers. Eyes: No scleral icterus. Lymph nodes: Cervical, supraclavicular, and axillary nodes normal. Heart: Regular rate and rhythm. S1 and S2 without murmurs. Edema noted bilaterally. Lung: Clear to auscultation without any rhonchi, wheezes or dullness to percussion. Abdomin: Soft, nontender without rebound or guarding. Musculoskeletal: No joint deformity or effusion. Skin: No rashes or lesions.   Lab Results: Lab Results  Component Value Date   WBC 7.3 06/29/2017   HGB 9.4 (L) 06/29/2017   HCT 27.3 (L) 06/29/2017   MCV 99 (H) 06/29/2017   PLT 134 (L) 06/29/2017     Chemistry      Component Value Date/Time   NA 132 (L) 07/22/2017 0844   K 4.5 07/22/2017 0844   CL 96 07/22/2017 0844   CO2 22 07/22/2017 0844   BUN 41 (H) 07/22/2017 0844   CREATININE 1.61 (H) 07/22/2017 0844   CREATININE 0.97 10/14/2015 1233      Component Value Date/Time   CALCIUM 8.9 07/22/2017 0844   ALKPHOS 44 06/29/2017 0849   AST 16 06/29/2017 0849   ALT 17 06/29/2017 0849   BILITOT 0.8 06/29/2017 0849        Impression and Plan:   67 year old man with:  1. Multifactorial anemia: His hemoglobin at baseline is between 11-12 low he had some fluctuations in the past because of hospitalizations. His hemoglobin in October 2018 declined 6.8 after an episode of epistaxis. He is currently  on oral iron supplements and his most recent hemoglobin in February 2019 was 9.4.  The differential diagnosis was discussed today with the patient. His anemia is related to blood loss and iron deficiency in addition to combination of anemia related to chronic disease and renal dysfunction. His creatinine clearance is close to 40 mL/m which is likely contributing.  The first up in management at this point is to repeat iron studies and supplement him adequately if he is still deficient. Risks and benefits associated with intravenous iron was discussed today as an alternative option if iron stores are so low. Medications include arthralgias, myalgias and rarely anaphylaxis. Other causes of anemia including plasma cell disorder, MDS are also possibilities.  We'll repeat his laboratory testing today and we'll communicate the results to him whether he needs intravenous iron and are not. Procrit could be also an option in the future if his hemoglobin does not improve with iron  infusion.   2. Heparin-induced thrombocytopenia diagnosed in August 2016.  Platelet count continues to be reasonably normal without any relapse.  I have recommended to avoid ALL heparin products including heparin flushes. He should be labeled with heparin allergy.   3. Deep vein thrombosis with pulmonary embolism: He is off anticoagulation because of her GI bleed and epistaxis. I see no reason or indication to restart that.   4. Follow-up: Will be in 3 months to recheck his hemoglobin.  15  minutes was spent with the patient face-to-face today.  More than 50% of time was dedicated to patient counseling, education and coordination of his care.    Zola Button, MD 3/6/201911:38 AM

## 2017-08-01 DIAGNOSIS — I8311 Varicose veins of right lower extremity with inflammation: Secondary | ICD-10-CM | POA: Diagnosis not present

## 2017-08-01 DIAGNOSIS — I872 Venous insufficiency (chronic) (peripheral): Secondary | ICD-10-CM | POA: Diagnosis not present

## 2017-08-01 DIAGNOSIS — Z85820 Personal history of malignant melanoma of skin: Secondary | ICD-10-CM | POA: Diagnosis not present

## 2017-08-01 DIAGNOSIS — L720 Epidermal cyst: Secondary | ICD-10-CM | POA: Diagnosis not present

## 2017-08-01 DIAGNOSIS — I8312 Varicose veins of left lower extremity with inflammation: Secondary | ICD-10-CM | POA: Diagnosis not present

## 2017-08-01 DIAGNOSIS — L570 Actinic keratosis: Secondary | ICD-10-CM | POA: Diagnosis not present

## 2017-08-05 ENCOUNTER — Ambulatory Visit: Payer: Medicare Other

## 2017-08-12 ENCOUNTER — Ambulatory Visit: Payer: Medicare Other

## 2017-08-24 ENCOUNTER — Encounter (INDEPENDENT_AMBULATORY_CARE_PROVIDER_SITE_OTHER): Payer: Self-pay

## 2017-08-24 ENCOUNTER — Other Ambulatory Visit: Payer: Medicare Other | Admitting: *Deleted

## 2017-08-24 DIAGNOSIS — N170 Acute kidney failure with tubular necrosis: Secondary | ICD-10-CM | POA: Diagnosis not present

## 2017-08-24 LAB — BASIC METABOLIC PANEL
BUN/Creatinine Ratio: 19 (ref 10–24)
BUN: 23 mg/dL (ref 8–27)
CO2: 21 mmol/L (ref 20–29)
Calcium: 9.1 mg/dL (ref 8.6–10.2)
Chloride: 95 mmol/L — ABNORMAL LOW (ref 96–106)
Creatinine, Ser: 1.24 mg/dL (ref 0.76–1.27)
GFR calc Af Amer: 68 mL/min/{1.73_m2} (ref >59)
GFR calc non Af Amer: 59 mL/min/{1.73_m2} — ABNORMAL LOW (ref >59)
Glucose: 101 mg/dL — ABNORMAL HIGH (ref 65–99)
Potassium: 3.8 mmol/L (ref 3.5–5.2)
Sodium: 130 mmol/L — ABNORMAL LOW (ref 134–144)

## 2017-09-19 ENCOUNTER — Other Ambulatory Visit: Payer: Self-pay | Admitting: Cardiovascular Disease

## 2017-09-28 ENCOUNTER — Other Ambulatory Visit: Payer: Medicare Other

## 2017-09-28 ENCOUNTER — Ambulatory Visit: Payer: Medicare Other | Admitting: Oncology

## 2017-09-29 ENCOUNTER — Inpatient Hospital Stay (HOSPITAL_BASED_OUTPATIENT_CLINIC_OR_DEPARTMENT_OTHER): Payer: Medicare Other | Admitting: Oncology

## 2017-09-29 ENCOUNTER — Inpatient Hospital Stay: Payer: Medicare Other | Attending: Oncology

## 2017-09-29 ENCOUNTER — Telehealth: Payer: Self-pay

## 2017-09-29 VITALS — BP 114/70 | HR 58 | Temp 98.2°F | Resp 18 | Ht 72.0 in | Wt 206.5 lb

## 2017-09-29 DIAGNOSIS — Z86718 Personal history of other venous thrombosis and embolism: Secondary | ICD-10-CM

## 2017-09-29 DIAGNOSIS — N189 Chronic kidney disease, unspecified: Secondary | ICD-10-CM | POA: Diagnosis not present

## 2017-09-29 DIAGNOSIS — D631 Anemia in chronic kidney disease: Secondary | ICD-10-CM

## 2017-09-29 DIAGNOSIS — D649 Anemia, unspecified: Secondary | ICD-10-CM

## 2017-09-29 DIAGNOSIS — E538 Deficiency of other specified B group vitamins: Secondary | ICD-10-CM

## 2017-09-29 DIAGNOSIS — D7582 Heparin induced thrombocytopenia (HIT): Secondary | ICD-10-CM | POA: Diagnosis not present

## 2017-09-29 LAB — IRON AND TIBC
Iron: 48 ug/dL (ref 42–163)
Saturation Ratios: 21 % — ABNORMAL LOW (ref 42–163)
TIBC: 228 ug/dL (ref 202–409)
UIBC: 180 ug/dL

## 2017-09-29 LAB — CBC WITH DIFFERENTIAL (CANCER CENTER ONLY)
Basophils Absolute: 0.1 10*3/uL (ref 0.0–0.1)
Basophils Relative: 1 %
Eosinophils Absolute: 0.4 10*3/uL (ref 0.0–0.5)
Eosinophils Relative: 5 %
HCT: 29.9 % — ABNORMAL LOW (ref 38.4–49.9)
Hemoglobin: 10.4 g/dL — ABNORMAL LOW (ref 13.0–17.1)
Lymphocytes Relative: 19 %
Lymphs Abs: 1.4 10*3/uL (ref 0.9–3.3)
MCH: 35 pg — ABNORMAL HIGH (ref 27.2–33.4)
MCHC: 34.8 g/dL (ref 32.0–36.0)
MCV: 100.7 fL — ABNORMAL HIGH (ref 79.3–98.0)
Monocytes Absolute: 0.9 10*3/uL (ref 0.1–0.9)
Monocytes Relative: 13 %
Neutro Abs: 4.6 10*3/uL (ref 1.5–6.5)
Neutrophils Relative %: 62 %
Platelet Count: 91 10*3/uL — ABNORMAL LOW (ref 140–400)
RBC: 2.97 MIL/uL — ABNORMAL LOW (ref 4.20–5.82)
RDW: 12.7 % (ref 11.0–14.6)
WBC Count: 7.4 10*3/uL (ref 4.0–10.3)

## 2017-09-29 LAB — VITAMIN B12: VITAMIN B 12: 291 pg/mL (ref 180–914)

## 2017-09-29 LAB — FERRITIN: Ferritin: 290 ng/mL (ref 22–316)

## 2017-09-29 NOTE — Progress Notes (Signed)
Hematology and Oncology Follow Up Visit  [name] [id number] 07/18/1950 67 y.o. 09/29/2017 10:17 AM [name], [name], MDMitchell, [name] Sa, MD   Principle Diagnosis: 67 year old man with:  1.  Thrombocytopenia diagnosed in August 2016.  He was diagnosed with heparin-induced thrombocytopenia and deep vein thrombosis and received heparin and platelet count dropped to 60,000.   2.  Multifactorial anemia diagnosed in October 2018.  The differential diagnosis including anemia of chronic disease, renal insufficiency and possible low dysplastic syndrome.   Current therapy:   Oral iron therapy once a day started in October 2018.    Interim History:  [name] is here for a follow-up visit.  Since the last visit, he reports no major changes in his health.  He denies any bleeding episodes including hemoptysis or hematemesis.  He denies any hematochezia or melena.  He ambulates without any difficulties although unsteady at times.  He does report mild fatigue which is chronic.  He tolerates iron reasonably well without any constipation or dyspepsia.   He does not report any headaches, blurry vision, syncope or seizures. He is not report any fevers, chills or sweats. Does not report any chest pain, palpitation orthopnea. Does not report any cough or hemoptysis or wheezing. Does not report any nausea, vomiting or abdominal pain.  Does not report any skin rashes or lesions. He is not report any lymphadenopathy or petechiae.  He denies any heat or cold intolerance.  He denies any bone pain or pathological fractures.  Remaining review of systems is negative.   Medications: I have reviewed the patient's current medications.  Current Outpatient Medications  Medication Sig Dispense Refill  . Ascorbic Acid (VITAMIN C) 100 MG tablet Take 100 mg by mouth daily.    . carvedilol (COREG) 25 MG tablet Take 37.5 mg (1.5 tablets) in the morning and take 25 mg (1 tablet) in the afternoon. 75 tablet 11  .  clobetasol ointment (TEMOVATE) 0.05 % apply to affected area twice a day if needed  0  . cloNIDine (CATAPRES) 0.2 MG tablet Take 1 tablet (0.2 mg total) by mouth 2 (two) times daily. 60 tablet 11  . ferrous sulfate 325 (65 FE) MG EC tablet Take 325 mg by mouth daily with breakfast.    . folic acid (FOLVITE) 1 MG tablet Take 1 tablet (1 mg total) by mouth daily. 30 tablet 0  . furosemide (LASIX) 40 MG tablet Take 1 tablet (40 mg total) by mouth daily as needed for fluid. 30 tablet 6  . lisinopril (PRINIVIL,ZESTRIL) 20 MG tablet Take 1 tablet (20 mg total) by mouth daily. 30 tablet 11  . NITROSTAT 0.4 MG SL tablet Place 1 tablet (0.4 mg total) under the tongue every 5 (five) minutes as needed. Chest pain (Patient not taking: Reported on 07/27/2017) 25 tablet 3  . pantoprazole (PROTONIX) 40 MG tablet Take 40 mg by mouth daily.  0  . ranitidine (ZANTAC) 300 MG tablet Take 1 tablet (300 mg total) by mouth at bedtime. 30 tablet 11  . rosuvastatin (CRESTOR) 20 MG tablet TAKE 1 TABLET BY MOUTH AT BEDTIME 90 tablet 2  . Thiamine HCl (VITAMIN B-1) 100 MG tablet Take 100 mg by mouth daily.      . traMADol (ULTRAM) 50 MG tablet Take 1 tablet (50 mg total) by mouth every 6 (six) hours as needed. 30 tablet 0  . triamcinolone ointment (KENALOG) 0.1 % Apply 1 application topically 2 (two) times daily. 80 g 1   No current facility-administered medications  for this visit.      Allergies:  Allergies  Allergen Reactions  . Hctz [Hydrochlorothiazide] Other (See Comments)    Black out due to electrolytes  . Heparin Other (See Comments)    HIT positive as of 12/31/14  . Norvasc [Amlodipine Besylate] Swelling       . Pletal [Cilostazol] Diarrhea    Past Medical History, Surgical history, Social history, and Family History were reviewed and updated.  Physical Exam: Blood pressure 114/70, pulse (!) 58, temperature 98.2 F (36.8 C), temperature source Oral, resp. rate 18, height 6' (1.829 m), weight 206 lb 8 oz  (93.7 kg), SpO2 100 %.   ECOG: 1 General appearance: Alert, awake gentleman without distress. Head: Atraumatic without abnormalities. Oral mucosa: Mucous membranes are moist and pink. Eyes: Pupils are equal and round reactive to light. Lymph nodes: No lymphadenopathy noted in the cervical, supraclavicular, and axillary nodes  Heart: Regular rate without murmurs or gallops. Lung: Clear all lung fields without any rhonchi, wheezes or dullness to percussion. Abdomin: Soft without any rebound or guarding. Musculoskeletal: No clubbing or cyanosis. Skin: No skin rashes or lesions.   Lab Results: Lab Results  Component Value Date   WBC 7.9 07/27/2017   HGB 9.3 (L) 07/27/2017   HCT 27.0 (L) 07/27/2017   MCV 103.1 (H) 07/27/2017   PLT 27 (L) 07/27/2017     Chemistry      Component Value Date/Time   NA 130 (L) 08/24/2017 1021   K 3.8 08/24/2017 1021   CL 95 (L) 08/24/2017 1021   CO2 21 08/24/2017 1021   BUN 23 08/24/2017 1021   CREATININE 1.24 08/24/2017 1021   CREATININE 0.97 10/14/2015 1233      Component Value Date/Time   CALCIUM 9.1 08/24/2017 1021   ALKPHOS 44 06/29/2017 0849   AST 16 06/29/2017 0849   ALT 17 06/29/2017 0849   BILITOT 0.8 06/29/2017 0849        Impression and Plan:   67 year old man with:  1. Multifactorial anemia: Due to anemia of chronically insufficiency, chronic disease, plasma cell disorder and possible myelodysplastic syndrome.  Iron studies obtained on 07/27/2017 showed iron level of 105 and ferritin of 262.  To complete his work-up, we will obtain a serum protein electrophoresis, erythropoietin level as well as B12 levels.  If his work-up continues to be unrevealing, a bone marrow biopsy will be needed at that time.  His hemoglobin slightly improved in the last few months without any active bleeding.  We have discussed the risks and benefits of obtaining a bone marrow biopsy and at this time we will defer that option.  We will continue to  monitor his counts closely.   2.  Thrombocytopenia: He was diagnosed with HIT in 2016.  His platelet count is 91,000 and adequate.  We will continue to monitor at this time.  3. Deep vein thrombosis with pulmonary embolism: No recent thrombosis or bleeding episodes.   4. Follow-up: Will be in 4 months.  15  minutes was spent with the patient face-to-face today.  More than 50% of time was dedicated to patient counseling, education and answering questions about his diagnosis and future plan of care.   Zola Button, MD 5/9/201910:17 AM

## 2017-09-29 NOTE — Telephone Encounter (Signed)
Printed avs and calender of upcoming appointment. Per 5/9 los 

## 2017-09-30 LAB — ERYTHROPOIETIN: Erythropoietin: 13.1 m[IU]/mL (ref 2.6–18.5)

## 2017-10-03 LAB — MULTIPLE MYELOMA PANEL, SERUM
ALBUMIN SERPL ELPH-MCNC: 3.5 g/dL (ref 2.9–4.4)
Albumin/Glob SerPl: 1.2 (ref 0.7–1.7)
Alpha 1: 0.3 g/dL (ref 0.0–0.4)
Alpha2 Glob SerPl Elph-Mcnc: 0.8 g/dL (ref 0.4–1.0)
B-GLOBULIN SERPL ELPH-MCNC: 0.9 g/dL (ref 0.7–1.3)
Gamma Glob SerPl Elph-Mcnc: 1.1 g/dL (ref 0.4–1.8)
Globulin, Total: 3.1 g/dL (ref 2.2–3.9)
IGA: 211 mg/dL (ref 61–437)
IgG (Immunoglobin G), Serum: 1002 mg/dL (ref 700–1600)
IgM (Immunoglobulin M), Srm: 70 mg/dL (ref 20–172)
TOTAL PROTEIN ELP: 6.6 g/dL (ref 6.0–8.5)

## 2017-10-05 ENCOUNTER — Ambulatory Visit: Payer: Medicare Other | Admitting: Nurse Practitioner

## 2017-11-14 ENCOUNTER — Other Ambulatory Visit: Payer: Self-pay | Admitting: Nurse Practitioner

## 2017-11-14 NOTE — Telephone Encounter (Signed)
Outpatient Medication Detail    Disp Refills Start End   carvedilol (COREG) 25 MG tablet 75 tablet 11 04/18/2017    Sig: Take 37.5 mg (1.5 tablets) in the morning and take 25 mg (1 tablet) in the afternoon.   Sent to pharmacy as: carvedilol (COREG) 25 MG tablet   E-Prescribing Status: Receipt confirmed by pharmacy (04/18/2017 12:52 PM EST)   Pharmacy   [facility] [place], [place]

## 2017-11-16 ENCOUNTER — Encounter: Payer: Self-pay | Admitting: Nurse Practitioner

## 2017-11-16 ENCOUNTER — Ambulatory Visit (INDEPENDENT_AMBULATORY_CARE_PROVIDER_SITE_OTHER): Payer: Medicare Other | Admitting: Nurse Practitioner

## 2017-11-16 VITALS — BP 140/80 | HR 56 | Ht 72.0 in | Wt 213.4 lb

## 2017-11-16 DIAGNOSIS — I25810 Atherosclerosis of coronary artery bypass graft(s) without angina pectoris: Secondary | ICD-10-CM

## 2017-11-16 DIAGNOSIS — I714 Abdominal aortic aneurysm, without rupture, unspecified: Secondary | ICD-10-CM

## 2017-11-16 DIAGNOSIS — I10 Essential (primary) hypertension: Secondary | ICD-10-CM | POA: Diagnosis not present

## 2017-11-16 DIAGNOSIS — I5022 Chronic systolic (congestive) heart failure: Secondary | ICD-10-CM | POA: Diagnosis not present

## 2017-11-16 DIAGNOSIS — I5032 Chronic diastolic (congestive) heart failure: Secondary | ICD-10-CM

## 2017-11-16 NOTE — Progress Notes (Signed)
CARDIOLOGY OFFICE NOTE  Date:  11/16/2017    [name] Date of Birth: 1951/05/11 Medical Record #[id number]  PCP:  Alroy Dust, L.Marlou Sa, MD  Cardiologist:  Jerel Shepherd    Chief Complaint  Patient presents with  . Coronary Artery Disease  . PAD  . AAA    4 month check - seen for Dr. Burt Knack    History of Present Illness: [name] is a 67 y.o. male who presents today for a 4 month check. Seen for Dr. Burt Knack. Former patient of Dr. Susa Simmonds.   He has an extensive history of PVD and CAD. He was previously followed by Dr. Bridgett Larsson at [facility] but now seeing Vascular in [facility] with Dr. Sammuel Hines. Prior cath in 2006 showing normal LV function with a totally occluded RCA with left to right collaterals. He has distal small vessel disease in the RCA with diffuse mild to moderate stenosis in the LAD. Former smoker. His PVD history is quite extensive and includes AAA (with no plans to intervene due to the nature of his aneurysm and his multiple comorbidities - noted scan was 5.8 x 5.4 from 02/2015).   Other problems include HTN, HLD and OA. He has a history of hyponatremia as well.He had CABG x 5 by Dr. Cyndia Bent back in 1194 - complicated by DVT and volume overload.Was on IV heparin, transitioned to Xarelto due to drop in platelets, HIT panel + and serotonin release assay + as well and he was changed to Arixtra and seen by Dr. Alen Blew. Warfarin started and he wason 6 months of therapyand then transitioned back to Plavix..   I have seen him several times over the past few years. He has had intermittent issues with his BP. Has had intermittent issues with volume overload. He has gone back to excessive alcohol use. He had not planned on going back to [facility] but a CT by ortho here showed the enlarging aneurysm - referred back to [facility]. There is no plan for surgical intervention.  Seen back in early Septemberof 2018 - she had had shoulder surgery. He had had a fall. Had been found to have  blood in his stool - saw GI - placedon PPI therapy, iron and Vit C.He was to have arepeat stool test.Cardiac status was ok. BP was up a little but they followed it at home and called in with a diary which was improved.   Admitted back in Octoberof 2018 with significant epistaxis - actually had to be transfused. ASA/Plavix stopped. BP was pretty high and he required several med changes.   Called almost immediately after that discharge- BP super low and we had to start cutting back. No further bleeding. Off his iron due to constipation. No further bleeding.  BP were quite labile - sky high here. We have continued to make medicine adjustments.He has gotten more limited by his chronic back pain.   Last seen in February by me. He had seen Dr. Greer Pickerel for his hernia - apparently there was to be a discussion with Dr. Sammuel Hines and Dr. Burt Knack about how to proceed - but Naithen and his wife knew that he is very high risk and had no plans to intervene and were going to try a girdle. He had gained weight - eating excessively and continuing to drink alcohol. She is pretty much letting him have whatever he wants.   Comes in today. Here withhis wife.Has been back to hematology - no active bleeding. Deferred bone marrow  biopsy and remains on iron therapy. She has been making home made ice cream. His weight is up some. He's drinking his beer with his friends at night. She is giving him Lasix every day - this seems to control the swelling. Hernia is worse. More discomfort. Unable to be reduced - see picture below. He had some issues with his bowels this past weekend - she was worried this was related to the hernia - this has now improved. No fever noted. No chest pain. Walking less. Breathing stable. Has not been back to [facility] since March of 2018.   Past Medical History:  Diagnosis Date  . AAA (abdominal aortic aneurysm) ([place])   . Anxiety   . CHF (congestive heart failure) ([place])   . COPD (chronic obstructive  pulmonary disease) ([place])   . Coronary artery disease   . GERD (gastroesophageal reflux disease)   . History of blood transfusion   . Hyperlipidemia   . Hypertension    takes meds daily  . Hyponatremia   . Lumbar degenerative disc disease   . Myocardial infarction ([place])   . PAD (peripheral artery disease) ([place])   . PONV (postoperative nausea and vomiting)   . Raynaud's disease /phenomenon   . Stroke [facility]) 10/15/2005    Past Surgical History:  Procedure Laterality Date  . ABDOMINAL ANGIOGRAM  03/30/2012  . ABDOMINAL AORTAGRAM N/A 03/30/2012   Procedure: ABDOMINAL Maxcine Ham;  Surgeon: Conrad , MD;  Location: [facility] CATH LAB;  Service: Cardiovascular;  Laterality: N/A;  . CARDIAC CATHETERIZATION  2006  . CARDIAC CATHETERIZATION N/A 12/09/2014   Procedure: Left Heart Cath and Coronary Angiography;  Surgeon: [name], MD;  Location: [facility] CV LAB;  Service: Cardiovascular;  Laterality: N/A;  . CAROTID ENDARTERECTOMY     bilateral  . CAROTID ENDARTERECTOMY  04/13/2005   Right and 06/03/2005 Left  . CORONARY ARTERY BYPASS GRAFT N/A 12/13/2014   Procedure: CORONARY ARTERY BYPASS GRAFT times five using left internal mammary artery and endoscopic right leg saphenous vein harvest;  Surgeon: Gaye Pollack, MD;  Location: [facility] OR;  Service: Open Heart Surgery;  Laterality: N/A;  . HERNIA REPAIR    . ILIAC ARTERY STENT  10/15/2005    Left common and external   . INTRAOPERATIVE TRANSESOPHAGEAL ECHOCARDIOGRAM N/A 12/13/2014   Procedure: INTRAOPERATIVE TRANSESOPHAGEAL ECHOCARDIOGRAM;  Surgeon: Gaye Pollack, MD;  Location: [facility] OR;  Service: Open Heart Surgery;  Laterality: N/A;     Medications: Current Meds  Medication Sig  . Ascorbic Acid (VITAMIN C) 100 MG tablet Take 100 mg by mouth daily.  . carvedilol (COREG) 25 MG tablet Take 37.5 mg (1.5 tablets) in the morning and take 25 mg (1 tablet) in the afternoon.  . clobetasol ointment (TEMOVATE) 0.05 % apply to affected area twice a day if  needed  . cloNIDine (CATAPRES) 0.2 MG tablet Take 1 tablet (0.2 mg total) by mouth 2 (two) times daily.  . ferrous sulfate 325 (65 FE) MG EC tablet Take 325 mg by mouth daily with breakfast.  . folic acid (FOLVITE) 1 MG tablet Take 1 tablet (1 mg total) by mouth daily.  . furosemide (LASIX) 40 MG tablet Take 40 mg by mouth daily.  Marland Kitchen lisinopril (PRINIVIL,ZESTRIL) 20 MG tablet Take 1 tablet (20 mg total) by mouth daily.  . nitroGLYCERIN (NITROSTAT) 0.4 MG SL tablet Place 0.4 mg under the tongue every 5 (five) minutes as needed for chest pain.  . pantoprazole (PROTONIX) 40 MG tablet Take 40 mg by  mouth daily.  . ranitidine (ZANTAC) 300 MG tablet Take 1 tablet (300 mg total) by mouth at bedtime.  . rosuvastatin (CRESTOR) 20 MG tablet TAKE 1 TABLET BY MOUTH AT BEDTIME  . Thiamine HCl (VITAMIN B-1) 100 MG tablet Take 100 mg by mouth daily.    . traMADol (ULTRAM) 50 MG tablet Take 1 tablet (50 mg total) by mouth every 6 (six) hours as needed.  . triamcinolone ointment (KENALOG) 0.1 % Apply 1 application topically 2 (two) times daily.  . [DISCONTINUED] furosemide (LASIX) 40 MG tablet Take 1 tablet (40 mg total) by mouth daily as needed for fluid. (Patient taking differently: Take 40 mg by mouth daily. )     Allergies: Allergies  Allergen Reactions  . Hctz [Hydrochlorothiazide] Other (See Comments)    Black out due to electrolytes  . Heparin Other (See Comments)    HIT positive as of 12/31/14  . Norvasc [Amlodipine Besylate] Swelling       . Pletal [Cilostazol] Diarrhea    Social History: The patient  reports that he quit smoking about 12 years ago. His smoking use included cigarettes. He has never used smokeless tobacco. He reports that he drinks about 14.4 oz of alcohol per week. He reports that he does not use drugs.   Family History: The patient's family history includes Coronary artery disease (age of onset: 44) in his father; Dementia in his mother; Heart disease in his brother and  father; Hyperlipidemia in his brother and father; Hypertension in his brother and father; Other (age of onset: 37) in his mother; Peripheral vascular disease in his father.   Review of Systems: Please see the history of present illness.   Otherwise, the review of systems is positive for none.   All other systems are reviewed and negative.   Physical Exam: VS:  BP 140/80 (BP Location: Left Arm, Patient Position: Sitting, Cuff Size: Normal)   Pulse (!) 56   Ht 6' (1.829 m)   Wt 213 lb 6.4 oz (96.8 kg)   SpO2 96% Comment: at rest  BMI 28.94 kg/m  .  BMI Body mass index is 28.94 kg/m.  Wt Readings from Last 3 Encounters:  11/16/17 213 lb 6.4 oz (96.8 kg)  09/29/17 206 lb 8 oz (93.7 kg)  07/27/17 215 lb 6.4 oz (97.7 kg)    General: Pleasant. Looks chronically ill. Having trouble walking. Alert and in no acute distress.   HEENT: Normal.  Neck: Supple, no JVD, carotid bruits, or masses noted.  Cardiac: Regular rate and rhythm. No murmurs, rubs, or gallops. No edema.  Respiratory:  Lungs are clear to auscultation bilaterally with normal work of breathing.  GI: Abdomen quite distended- marked hernia noted - see below.  MS: No deformity or atrophy. Gait and ROM intact. Moving slower.  Skin: Warm and dry. Color is normal.  Neuro:  Strength and sensation are intact and no gross focal deficits noted.  Psych: Alert, appropriate and with normal affect.        LABORATORY DATA:  EKG:  EKG is not ordered today.  Lab Results  Component Value Date   WBC 7.4 09/29/2017   HGB 10.4 (L) 09/29/2017   HCT 29.9 (L) 09/29/2017   PLT 91 (L) 09/29/2017   GLUCOSE 101 (H) 08/24/2017   CHOL 131 06/29/2017   TRIG 27 06/29/2017   HDL 74 06/29/2017   LDLCALC 52 06/29/2017   ALT 17 06/29/2017   AST 16 06/29/2017   NA 130 (L) 08/24/2017  K 3.8 08/24/2017   CL 95 (L) 08/24/2017   CREATININE 1.24 08/24/2017   BUN 23 08/24/2017   CO2 21 08/24/2017   TSH 0.60 08/05/2015   INR 0.98 02/25/2017    HGBA1C 5.4 12/12/2014     BNP (last 3 results) No results for input(s): BNP in the last 8760 hours.  ProBNP (last 3 results) Recent Labs    06/29/17 0849 07/07/17 0814  PROBNP 5,514* 3,519*     Other Studies Reviewed Today:  Limted CT 06/23/2016 Impressions    --Limited study in the absence of IV contrast.    --Mild interval enlargement of the infrarenal abdominal aneurysm measuring6.2 x 5.8 cm (previously 5.8 x 5.4 cm) .     --Stable left common iliac aneurysm.    --Moderate to severe atherosclerotic changes, as above, with patency and degree of stenosis unclear due to lack of contrast.    --Umbilical hernia including loop of bowel with no clear overlying skin, likely an ostomy however unclear by CT.        TEE Study Conclusions from 11/2014  - Left ventricle: Systolic function was mildly reduced. The  estimated ejection fraction was in the range of 45% to 50%.  Hypokinesis of the inferior myocardium. - Aortic valve: No evidence of vegetation. There was trivial  regurgitation. - Mitral valve: Systolic bowing without prolapse. There was mild  regurgitation. - Left atrium: No evidence of thrombus in the atrial cavity or  appendage. No evidence of thrombus in the appendage. - Atrial septum: No defect or patent foramen ovale was identified. - Tricuspid valve: No evidence of vegetation.   Assessment/Plan:  1.CAD - no active chest pain - continue with medical management.   2. Chronic Anemia - he is followed again now by hematology  3. Prior Episode ofEpistaxis-this has not recurred. He remains off Plavix  4. HTN - BP ok here today - no changes made.   5. Chronic systolic HF - NYHA II/III - on lasix now every day to control swelling. Diet not great but this is not going to change.   6. Prior RLE DVT and PE - treated with 6 months of Coumadin - this was stopped March 1st, 2017. Did have + HIT panel.   7. Chronic Thrombocytopenia    - Heparin induced clinically and HIT Ab panel positive - Serotonin release assay positive as well and hence HIT confirmed - Heparin is listed as an allergy for him - he is back seeing Hematology.   8.  PAD/AAA - -followed byDr. Gloris Ham no plans to intervene.His AAA is very complex and due to degree of calcification has been deemed not electively operable - there are no plans for surgical intervention at this time.   9. Enlarging Umbilical hernia - does not wish to go back to see General Surgery here - now with more discomfort - unable to reduce ? Bowel issues. Girdles are not working. She is going to try and get him back to see Dr. Sammuel Hines so that they could possible see General Surgery at [facility]. It may be that he would have hernia repair with no plans whatsoever for AAA repair. Regardless, he is at increased risk but we may need this more for quality of life due to worsening symptoms. Very tenuous situation.   11. Back pain- not discussed today  Current medicines are reviewed with the patient today.  The patient does not have concerns regarding medicines other than what has been noted above.  The following changes have  been made:  See above.  Labs/ tests ordered today include:   No orders of the defined types were placed in this encounter.    Disposition:   FU with me in 3 to 4 months.   Patient is agreeable to this plan and will call if any problems develop in the interim.   SignedTruitt Merle, NP  11/16/2017 9:04 AM  [facility] [address] [place], [place]  [address] Phone: [phone] Fax: [phone]

## 2017-11-16 NOTE — Patient Instructions (Addendum)
We will be checking the following labs today - NONE   Medication Instructions:    Continue with your current medicines.     Testing/Procedures To Be Arranged:  N/A  Follow-Up:   See me in 3 to 4 months    Other Special Instructions:   Call Dr. Eddie Dibbles office to make an appointment to discuss general surgery referral thru [facility]    If you need a refill on your cardiac medications before your next appointment, please call your pharmacy.   Call the [place] office at [phone] if you have any questions, problems or concerns.

## 2017-12-14 ENCOUNTER — Telehealth: Payer: Self-pay | Admitting: Oncology

## 2017-12-14 NOTE — Telephone Encounter (Signed)
Patient called to reschedule  °

## 2018-01-26 ENCOUNTER — Other Ambulatory Visit: Payer: Medicare Other

## 2018-01-26 ENCOUNTER — Ambulatory Visit: Payer: Medicare Other | Admitting: Oncology

## 2018-01-27 ENCOUNTER — Encounter: Payer: Self-pay | Admitting: Nurse Practitioner

## 2018-02-01 ENCOUNTER — Telehealth: Payer: Self-pay | Admitting: Oncology

## 2018-02-01 ENCOUNTER — Inpatient Hospital Stay: Payer: Medicare Other | Attending: Oncology

## 2018-02-01 ENCOUNTER — Inpatient Hospital Stay (HOSPITAL_BASED_OUTPATIENT_CLINIC_OR_DEPARTMENT_OTHER): Payer: Medicare Other | Admitting: Oncology

## 2018-02-01 VITALS — BP 153/68 | HR 58 | Temp 98.2°F | Resp 18 | Ht 72.0 in | Wt 211.2 lb

## 2018-02-01 DIAGNOSIS — D7582 Heparin induced thrombocytopenia (HIT): Secondary | ICD-10-CM | POA: Diagnosis not present

## 2018-02-01 DIAGNOSIS — D649 Anemia, unspecified: Secondary | ICD-10-CM

## 2018-02-01 LAB — CBC WITH DIFFERENTIAL (CANCER CENTER ONLY)
BASOS ABS: 0.1 10*3/uL (ref 0.0–0.1)
Basophils Relative: 1 %
Eosinophils Absolute: 0.5 10*3/uL (ref 0.0–0.5)
Eosinophils Relative: 6 %
HEMATOCRIT: 26.7 % — AB (ref 38.4–49.9)
Hemoglobin: 9.4 g/dL — ABNORMAL LOW (ref 13.0–17.1)
LYMPHS PCT: 13 %
Lymphs Abs: 1 10*3/uL (ref 0.9–3.3)
MCH: 34.1 pg — ABNORMAL HIGH (ref 27.2–33.4)
MCHC: 35.2 g/dL (ref 32.0–36.0)
MCV: 96.7 fL (ref 79.3–98.0)
Monocytes Absolute: 1 10*3/uL — ABNORMAL HIGH (ref 0.1–0.9)
Monocytes Relative: 13 %
NEUTROS ABS: 5 10*3/uL (ref 1.5–6.5)
Neutrophils Relative %: 67 %
Platelet Count: 92 10*3/uL — ABNORMAL LOW (ref 140–400)
RBC: 2.76 MIL/uL — AB (ref 4.20–5.82)
RDW: 14 % (ref 11.0–14.6)
WBC: 7.5 10*3/uL (ref 4.0–10.3)

## 2018-02-01 NOTE — Progress Notes (Signed)
Hematology and Oncology Follow Up Visit  [name] [id number] 08-Sep-1950 67 y.o. 02/01/2018 1:33 PM [name], [name], [name], [name] Sa, MD   Principle Diagnosis: 67 year old man with:  1.  Thrombocytopenia: He was initially found to have heparin-induced thrombocytopenia diagnosed in August 2016.  He developed worsening thrombocytopenia in March 2019.  2.  There is to be related to chronically insufficiency as well as iron deficiency.   Current therapy:   Active surveillance.    Interim History:  [name] reports today for a follow-up.  Since the last visit, he reports no major changes in his health.  He stopped taking oral iron supplements since the last visit.  He denies any hematochezia or melena.  He does have abdominal hernia that has been evaluated and likely not an excellent surgical candidate at this time.  His mobility is limited but for the most part no changes in his clinical status.  He continues to have reasonable quality of life.  He does not report any headaches, blurry vision, syncope or seizures.  He denies any alteration in mental status or confusion.  He is not report any fevers, chills or sweats. Does not report any chest pain, palpitation orthopnea. Does not report any cough or hemoptysis or wheezing. Does not report any nausea, vomiting.  Changes in bowel habits.  Denies any hematochezia or melena.  Does not report any skin rashes or lesions. He is not report any lymphadenopathy or petechiae.He denies any myalgias.  Remaining review of systems is negative.   Medications: I have reviewed the patient's current medications.  Current Outpatient Medications  Medication Sig Dispense Refill  . Ascorbic Acid (VITAMIN C) 100 MG tablet Take 100 mg by mouth daily.    . carvedilol (COREG) 25 MG tablet Take 37.5 mg (1.5 tablets) in the morning and take 25 mg (1 tablet) in the afternoon. 75 tablet 11  . clobetasol ointment (TEMOVATE) 0.05 % apply to affected area twice a  day if needed  0  . cloNIDine (CATAPRES) 0.2 MG tablet Take 1 tablet (0.2 mg total) by mouth 2 (two) times daily. 60 tablet 11  . ferrous sulfate 325 (65 FE) MG EC tablet Take 325 mg by mouth daily with breakfast.    . folic acid (FOLVITE) 1 MG tablet Take 1 tablet (1 mg total) by mouth daily. 30 tablet 0  . furosemide (LASIX) 40 MG tablet Take 40 mg by mouth daily.    Marland Kitchen lisinopril (PRINIVIL,ZESTRIL) 20 MG tablet Take 1 tablet (20 mg total) by mouth daily. 30 tablet 11  . nitroGLYCERIN (NITROSTAT) 0.4 MG SL tablet Place 0.4 mg under the tongue every 5 (five) minutes as needed for chest pain.    . pantoprazole (PROTONIX) 40 MG tablet Take 40 mg by mouth daily.  0  . ranitidine (ZANTAC) 300 MG tablet Take 1 tablet (300 mg total) by mouth at bedtime. 30 tablet 11  . rosuvastatin (CRESTOR) 20 MG tablet TAKE 1 TABLET BY MOUTH AT BEDTIME 90 tablet 2  . Thiamine HCl (VITAMIN B-1) 100 MG tablet Take 100 mg by mouth daily.      . traMADol (ULTRAM) 50 MG tablet Take 1 tablet (50 mg total) by mouth every 6 (six) hours as needed. 30 tablet 0  . triamcinolone ointment (KENALOG) 0.1 % Apply 1 application topically 2 (two) times daily. 80 g 1   No current facility-administered medications for this visit.      Allergies:  Allergies  Allergen Reactions  . Hctz [Hydrochlorothiazide] Other (  See Comments)    Black out due to electrolytes  . Heparin Other (See Comments)    HIT positive as of 12/31/14  . Norvasc [Amlodipine Besylate] Swelling       . Pletal [Cilostazol] Diarrhea    Past Medical History, Surgical history, Social history, and Family History were reviewed and updated.  Physical Exam: Blood pressure (!) 153/68, pulse (!) 58, temperature 98.2 F (36.8 C), temperature source Oral, resp. rate 18, height 6' (1.829 m), weight 211 lb 3.2 oz (95.8 kg), SpO2 96 %.   ECOG: 1   General appearance: Comfortable appearing without any discomfort Head: Normocephalic without any trauma Oropharynx:  Mucous membranes are moist and pink without any thrush or ulcers. Eyes: Pupils are equal and round reactive to light. Lymph nodes: No cervical, supraclavicular, inguinal or axillary lymphadenopathy.   Heart:regular rate and rhythm.  S1 and S2 without leg edema. Lung: Clear without any rhonchi or wheezes.  No dullness to percussion. Abdomin: Soft, nontender, nondistended with good bowel sounds.  No hepatosplenomegaly. Musculoskeletal: No joint deformity or effusion.  Full range of motion noted. Neurological: No deficits noted on motor, sensory and deep tendon reflex exam. Skin: Miosis noted which has not changed on his arms.    Lab Results: Lab Results  Component Value Date   WBC 7.5 02/01/2018   HGB 9.4 (L) 02/01/2018   HCT 26.7 (L) 02/01/2018   MCV 96.7 02/01/2018   PLT 92 (L) 02/01/2018     Chemistry      Component Value Date/Time   NA 130 (L) 08/24/2017 1021   K 3.8 08/24/2017 1021   CL 95 (L) 08/24/2017 1021   CO2 21 08/24/2017 1021   BUN 23 08/24/2017 1021   CREATININE 1.24 08/24/2017 1021   CREATININE 0.97 10/14/2015 1233      Component Value Date/Time   CALCIUM 9.1 08/24/2017 1021   ALKPHOS 44 06/29/2017 0849   AST 16 06/29/2017 0849   ALT 17 06/29/2017 0849   BILITOT 0.8 06/29/2017 0849        Impression and Plan:   67 year old man with:  1. Anemia: Normocytic and normochromic in nature.  This has been documented in October 2018.  He has been on iron supplements in the past.  His anemia work-up completed May 2019 was personally reviewed and discussed with the patient.  His iron level 48 with ferritin of 290.  He had a normal serum protein electrophoresis.  His anemia appears to be multifactorial in nature and currently stable.  He has no active bleeding at this time and he is asymptomatic.  I recommended continued observation and surveillance and will repeat iron studies in the future.   2.  Thrombocytopenia: Clear the count from today is 92,000 without  any active bleeding.  We will continue to monitor his counts periodically.   3. Follow-up: Will be in 4 months.  15  minutes was spent with the patient face-to-face today.  More than 50% of time was dedicated to reviewing the natural course of his laboratory finding, counseling, education and coordinating his future plan of care.   Zola Button, MD 9/11/20191:33 PM

## 2018-02-01 NOTE — Telephone Encounter (Signed)
Gave pt avs and calendar  °

## 2018-02-06 DIAGNOSIS — L300 Nummular dermatitis: Secondary | ICD-10-CM | POA: Diagnosis not present

## 2018-02-06 DIAGNOSIS — L820 Inflamed seborrheic keratosis: Secondary | ICD-10-CM | POA: Diagnosis not present

## 2018-02-06 DIAGNOSIS — I872 Venous insufficiency (chronic) (peripheral): Secondary | ICD-10-CM | POA: Diagnosis not present

## 2018-02-06 DIAGNOSIS — D225 Melanocytic nevi of trunk: Secondary | ICD-10-CM | POA: Diagnosis not present

## 2018-02-06 DIAGNOSIS — L821 Other seborrheic keratosis: Secondary | ICD-10-CM | POA: Diagnosis not present

## 2018-02-06 DIAGNOSIS — I8312 Varicose veins of left lower extremity with inflammation: Secondary | ICD-10-CM | POA: Diagnosis not present

## 2018-02-06 DIAGNOSIS — I8311 Varicose veins of right lower extremity with inflammation: Secondary | ICD-10-CM | POA: Diagnosis not present

## 2018-02-06 DIAGNOSIS — Z85820 Personal history of malignant melanoma of skin: Secondary | ICD-10-CM | POA: Diagnosis not present

## 2018-02-06 DIAGNOSIS — D224 Melanocytic nevi of scalp and neck: Secondary | ICD-10-CM | POA: Diagnosis not present

## 2018-02-13 ENCOUNTER — Ambulatory Visit (INDEPENDENT_AMBULATORY_CARE_PROVIDER_SITE_OTHER): Payer: Medicare Other | Admitting: Nurse Practitioner

## 2018-02-13 ENCOUNTER — Encounter: Payer: Self-pay | Admitting: Nurse Practitioner

## 2018-02-13 VITALS — BP 130/100 | HR 55 | Ht 71.0 in | Wt 207.8 lb

## 2018-02-13 DIAGNOSIS — I10 Essential (primary) hypertension: Secondary | ICD-10-CM | POA: Diagnosis not present

## 2018-02-13 DIAGNOSIS — E7849 Other hyperlipidemia: Secondary | ICD-10-CM

## 2018-02-13 DIAGNOSIS — I5022 Chronic systolic (congestive) heart failure: Secondary | ICD-10-CM

## 2018-02-13 DIAGNOSIS — I25810 Atherosclerosis of coronary artery bypass graft(s) without angina pectoris: Secondary | ICD-10-CM

## 2018-02-13 DIAGNOSIS — I5032 Chronic diastolic (congestive) heart failure: Secondary | ICD-10-CM

## 2018-02-13 LAB — HEPATIC FUNCTION PANEL
ALT: 5 IU/L (ref 0–44)
AST: 14 IU/L (ref 0–40)
Albumin: 4.1 g/dL (ref 3.6–4.8)
Alkaline Phosphatase: 75 IU/L (ref 39–117)
Bilirubin Total: 1.4 mg/dL — ABNORMAL HIGH (ref 0.0–1.2)
Bilirubin, Direct: 0.4 mg/dL (ref 0.00–0.40)
Total Protein: 6.3 g/dL (ref 6.0–8.5)

## 2018-02-13 LAB — LIPID PANEL
Chol/HDL Ratio: 1.8 ratio (ref 0.0–5.0)
Cholesterol, Total: 118 mg/dL (ref 100–199)
HDL: 66 mg/dL (ref >39)
LDL Calculated: 42 mg/dL (ref 0–99)
Triglycerides: 50 mg/dL (ref 0–149)
VLDL Cholesterol Cal: 10 mg/dL (ref 5–40)

## 2018-02-13 LAB — BASIC METABOLIC PANEL
BUN/Creatinine Ratio: 12 (ref 10–24)
BUN: 26 mg/dL (ref 8–27)
CO2: 22 mmol/L (ref 20–29)
Calcium: 9.1 mg/dL (ref 8.6–10.2)
Chloride: 89 mmol/L — ABNORMAL LOW (ref 96–106)
Creatinine, Ser: 2.21 mg/dL — ABNORMAL HIGH (ref 0.76–1.27)
GFR calc Af Amer: 34 mL/min/{1.73_m2} — ABNORMAL LOW (ref >59)
GFR calc non Af Amer: 29 mL/min/{1.73_m2} — ABNORMAL LOW (ref >59)
Glucose: 92 mg/dL (ref 65–99)
Potassium: 4 mmol/L (ref 3.5–5.2)
Sodium: 128 mmol/L — ABNORMAL LOW (ref 134–144)

## 2018-02-13 NOTE — Progress Notes (Signed)
CARDIOLOGY OFFICE NOTE  Date:  02/13/2018    [name] Date of Birth: 1951-05-04 Medical Record #[id number]  PCP:  Alroy Dust, L.Marlou Sa, MD  Cardiologist:  Jerel Shepherd    Chief Complaint  Patient presents with  . Coronary Artery Disease  . Hypertension  . Hyperlipidemia    3 month check - seen for Dr. Burt Knack    History of Present Illness: [name] is a 67 y.o. male who presents today for a 3 month check. Seen for Dr. Burt Knack. Former patient of Dr. Susa Simmonds.   He has an extensive history of PVD and CAD. He was previously followed by Dr. Bridgett Larsson at [facility] but now seeing Vascular in [facility] with Dr. Sammuel Hines. Prior cath in 2006 showing normal LV function with a totally occluded RCA with left to right collaterals. He has distal small vessel disease in the RCA with diffuse mild to moderate stenosis in the LAD. Former smoker. His PVD history is quite extensive and includes AAA (with no plans to intervene due to the nature of his aneurysm and his multiple comorbidities - noted scan was 5.8 x 5.4 from 02/2015).   Other problems include HTN, HLD and OA. He has a history of hyponatremia as well.He had CABG x 5 by Dr. Cyndia Bent back in 4782 - complicated by DVT and volume overload.Was on IV heparin, transitioned to Xarelto due to drop in platelets, HIT panel + and serotonin release assay + as well and he was changed to Arixtra and seen by Dr. Alen Blew. Warfarin started and he wason 6 months of therapyand then transitioned back to Plavix..   I have seen him several times over the past few years. He has had intermittent issues with his BP. Has had intermittent issues with volume overload. He has gone back to excessive alcohol use. He had not planned on going back to [facility] but a CT by ortho here showed the enlarging aneurysm - referred back to [facility]. There is no plan for surgical intervention.  Seen back in early Septemberof 2018 - she had had shoulder surgery. He had had a fall. Had  been found to have blood in his stool - saw GI - placedon PPI therapy, iron and Vit C.He was to have arepeat stool test.Cardiac status was ok. BP was up a little but they followed it at home and called in with a diary which was improved.   Admitted back in Octoberof 2018 with significant epistaxis - actually had to be transfused. ASA/Plavix stopped. BP was pretty high and he required several med changes.   Called almost immediately after that discharge- BP super low and we had to start cutting back. No further bleeding. Off his iron due to constipation. No further bleeding.  BP were quite labile - sky high here. We have continued to make medicine adjustments.He has gotten more limited by his chronic back pain.   When I saw him this past February - he had seen Dr. Greer Pickerel for his hernia - apparently there was to be a discussion with Dr. Sammuel Hines and Dr. Burt Knack about how to proceed - but Demani and his wife knew that he is very high risk and had no plans to intervene and were going to try a girdle. He had gained weight - eating excessively and continuing to drink alcohol. She is pretty much letting him have whatever he wants. Last seen in June - had deferred on bone marrow biopsy for his anemia. He was on  iron. Drinking beer. Hernia continues to enlarge and becoming much more bothersome with pain and GI symptoms - they were planning on going back to [facility] to discuss options with general surgery there - did not wish to see general surgery here any more.   Comes in today. Here withhis wife.Seems to be "holding his own". More troubled by constipation and back pain. No chest pain. Breathing is ok. He is walking less. Sleeping more. They did NOT go back to [facility] - says it is too much of a production and "no one is going to operate anyway" - so they have no plans to see anyone. Says he is "living his life". Gets out some at night - sees his buddies - drinks a few beers. Weight is down a few pounds. Legs  look ok. BP better at home.   Past Medical History:  Diagnosis Date  . AAA (abdominal aortic aneurysm) ([place])   . Anxiety   . CHF (congestive heart failure) ([place])   . COPD (chronic obstructive pulmonary disease) ([place])   . Coronary artery disease   . GERD (gastroesophageal reflux disease)   . History of blood transfusion   . Hyperlipidemia   . Hypertension    takes meds daily  . Hyponatremia   . Lumbar degenerative disc disease   . Myocardial infarction ([place])   . PAD (peripheral artery disease) ([place])   . PONV (postoperative nausea and vomiting)   . Raynaud's disease /phenomenon   . Stroke [facility]) 10/15/2005    Past Surgical History:  Procedure Laterality Date  . ABDOMINAL ANGIOGRAM  03/30/2012  . ABDOMINAL AORTAGRAM N/A 03/30/2012   Procedure: ABDOMINAL Maxcine Ham;  Surgeon: Conrad Beaverton, MD;  Location: [facility] CATH LAB;  Service: Cardiovascular;  Laterality: N/A;  . CARDIAC CATHETERIZATION  2006  . CARDIAC CATHETERIZATION N/A 12/09/2014   Procedure: Left Heart Cath and Coronary Angiography;  Surgeon: [name], MD;  Location: [place] CV LAB;  Service: Cardiovascular;  Laterality: N/A;  . CAROTID ENDARTERECTOMY     bilateral  . CAROTID ENDARTERECTOMY  04/13/2005   Right and 06/03/2005 Left  . CORONARY ARTERY BYPASS GRAFT N/A 12/13/2014   Procedure: CORONARY ARTERY BYPASS GRAFT times five using left internal mammary artery and endoscopic right leg saphenous vein harvest;  Surgeon: Gaye Pollack, MD;  Location: [place] OR;  Service: Open Heart Surgery;  Laterality: N/A;  . HERNIA REPAIR    . ILIAC ARTERY STENT  10/15/2005    Left common and external   . INTRAOPERATIVE TRANSESOPHAGEAL ECHOCARDIOGRAM N/A 12/13/2014   Procedure: INTRAOPERATIVE TRANSESOPHAGEAL ECHOCARDIOGRAM;  Surgeon: Gaye Pollack, MD;  Location: [facility];  Service: Open Heart Surgery;  Laterality: N/A;     Medications: Current Meds  Medication Sig  . Ascorbic Acid (VITAMIN C) 100 MG tablet Take 100 mg by mouth daily.    . carvedilol (COREG) 25 MG tablet Take 37.5 mg (1.5 tablets) in the morning and take 25 mg (1 tablet) in the afternoon.  . clobetasol ointment (TEMOVATE) 0.05 % apply to affected area twice a day if needed  . cloNIDine (CATAPRES) 0.2 MG tablet Take 1 tablet (0.2 mg total) by mouth 2 (two) times daily.  Marland Kitchen docusate sodium (COLACE) 100 MG capsule Take 100 mg by mouth daily.  . ferrous sulfate 325 (65 FE) MG EC tablet Take 325 mg by mouth daily with breakfast.  . folic acid (FOLVITE) 1 MG tablet Take 1 tablet (1 mg total) by mouth daily.  . furosemide (LASIX) 40 MG  tablet Take 40 mg by mouth daily.  Marland Kitchen lisinopril (PRINIVIL,ZESTRIL) 20 MG tablet Take 1 tablet (20 mg total) by mouth daily.  . nitroGLYCERIN (NITROSTAT) 0.4 MG SL tablet Place 0.4 mg under the tongue every 5 (five) minutes as needed for chest pain.  . pantoprazole (PROTONIX) 40 MG tablet Take 40 mg by mouth daily.  . ranitidine (ZANTAC) 300 MG tablet Take 1 tablet (300 mg total) by mouth at bedtime.  . rosuvastatin (CRESTOR) 20 MG tablet TAKE 1 TABLET BY MOUTH AT BEDTIME  . Thiamine HCl (VITAMIN B-1) 100 MG tablet Take 100 mg by mouth daily.    . traMADol (ULTRAM) 50 MG tablet Take 1 tablet (50 mg total) by mouth every 6 (six) hours as needed.  . triamcinolone ointment (KENALOG) 0.1 % Apply 1 application topically 2 (two) times daily.     Allergies: Allergies  Allergen Reactions  . Hctz [Hydrochlorothiazide] Other (See Comments)    Black out due to electrolytes  . Heparin Other (See Comments)    HIT positive as of 12/31/14  . Norvasc [Amlodipine Besylate] Swelling       . Pletal [Cilostazol] Diarrhea    Social History: The patient  reports that he quit smoking about 12 years ago. His smoking use included cigarettes. He has never used smokeless tobacco. He reports that he drinks about 24.0 standard drinks of alcohol per week. He reports that he does not use drugs.   Family History: The patient's family history includes  Coronary artery disease (age of onset: 14) in his father; Dementia in his mother; Heart disease in his brother and father; Hyperlipidemia in his brother and father; Hypertension in his brother and father; Other (age of onset: 76) in his mother; Peripheral vascular disease in his father.   Review of Systems: Please see the history of present illness.   Otherwise, the review of systems is positive for none.   All other systems are reviewed and negative.   Physical Exam: VS:  BP (!) 130/100 (BP Location: Left Arm, Patient Position: Sitting, Cuff Size: Normal)   Pulse (!) 55   Ht 5' 11"  (1.803 m)   Wt 207 lb 12.8 oz (94.3 kg)   BMI 28.98 kg/m  .  BMI Body mass index is 28.98 kg/m.  Wt Readings from Last 3 Encounters:  02/13/18 207 lb 12.8 oz (94.3 kg)  02/01/18 211 lb 3.2 oz (95.8 kg)  11/16/17 213 lb 6.4 oz (96.8 kg)    General: Pleasant. Chronically ill appearing but alert and in no acute distress. Color sallow.   HEENT: Normal.  Neck: Supple, no JVD, carotid bruits, or masses noted.  Cardiac: Regular rate and rhythm. No murmurs, rubs, or gallops. No significant edema. Large umbilical hernia - looks better today though - no scaling - seems a bit smaller.  Respiratory:  Lungs are clear to auscultation bilaterally with normal work of breathing.  GI: Soft and nontender.  MS: No deformity or atrophy. Gait and ROM intact but seems unsteady.  Skin: Warm and dry. Color is normal.  Neuro:  Strength and sensation are intact and no gross focal deficits noted.  Psych: Alert, appropriate and with normal affect.   LABORATORY DATA:  EKG:  EKG is not ordered today.  Lab Results  Component Value Date   WBC 7.5 02/01/2018   HGB 9.4 (L) 02/01/2018   HCT 26.7 (L) 02/01/2018   PLT 92 (L) 02/01/2018   GLUCOSE 101 (H) 08/24/2017   CHOL 131 06/29/2017   TRIG  27 06/29/2017   HDL 74 06/29/2017   LDLCALC 52 06/29/2017   ALT 17 06/29/2017   AST 16 06/29/2017   NA 130 (L) 08/24/2017   K 3.8  08/24/2017   CL 95 (L) 08/24/2017   CREATININE 1.24 08/24/2017   BUN 23 08/24/2017   CO2 21 08/24/2017   TSH 0.60 08/05/2015   INR 0.98 02/25/2017   HGBA1C 5.4 12/12/2014     BNP (last 3 results) No results for input(s): BNP in the last 8760 hours.  ProBNP (last 3 results) Recent Labs    06/29/17 0849 07/07/17 0814  PROBNP 5,514* 3,519*     Other Studies Reviewed Today:  Limted CT 06/23/2016 Impressions    --Limited study in the absence of IV contrast.    --Mild interval enlargement of the infrarenal abdominal aneurysm measuring6.2 x 5.8 cm (previously 5.8 x 5.4 cm) .     --Stable left common iliac aneurysm.    --Moderate to severe atherosclerotic changes, as above, with patency and degree of stenosis unclear due to lack of contrast.    --Umbilical hernia including loop of bowel with no clear overlying skin, likely an ostomy however unclear by CT.        TEE Study Conclusions from 11/2014  - Left ventricle: Systolic function was mildly reduced. The  estimated ejection fraction was in the range of 45% to 50%.  Hypokinesis of the inferior myocardium. - Aortic valve: No evidence of vegetation. There was trivial  regurgitation. - Mitral valve: Systolic bowing without prolapse. There was mild  regurgitation. - Left atrium: No evidence of thrombus in the atrial cavity or  appendage. No evidence of thrombus in the appendage. - Atrial septum: No defect or patent foramen ovale was identified. - Tricuspid valve: No evidence of vegetation.   Assessment/Plan:  1.CAD - prior CABG - no active chest pain - would favor conservative management.   2. Chronic Anemia - he is followed by hematology - off iron therapy  3. PriorEpisode ofEpistaxis-this has not recurred. He remains off Plavix  4. HTN - BP better at home than here - no changes made today  5. Chronic systolic HF - NYHA II/III - weight is down a few pounds. No shortness of  breath - I have left him on his current regimen.   6. Prior RLE DVT and PE - treated with 6 months of Coumadin - this was stopped March 1st, 2017. Did have + HIT panel.   7. Chronic Thrombocytopenia  - Heparin induced clinically and HIT Ab panel positive - Serotonin release assay positive as well and hence HIT confirmed - Heparin is listed as an allergy for him - he is back seeing Hematology.   8.  PAD/AAA - -[facility] Marlou Porch there have been NO plans to intervene due to the complexity and expected poor outcome. The record notes that his AAA is very complex and due to degree of calcification has been deemed not electively operable - there are no plans for surgical intervention at this time.   9. Enlarging Umbilical hernia -see prior picture - looks a little better today - he has been turned down by general surgery here and felt to be too high risk by Dr. Sammuel Hines, Burt Knack and myself. He has opted for a conservative approach - tells me again today that he has no plans for surgical intervention and continues "to do my bucket list". Still very tenuous situation.   11. Back pain- using Ultram PRN.  12.  Hyponatremia - lab today  13. HLD - on statin - lab today  His overall prognosis is tenuous and poor. I approached the subject of palliative care today - they do not wish to consider at this time.   Current medicines are reviewed with the patient today.  The patient does not have concerns regarding medicines other than what has been noted above.  The following changes have been made:  See above.  Labs/ tests ordered today include:    Orders Placed This Encounter  Procedures  . Basic metabolic panel  . Hepatic function panel  . Lipid panel  . EKG 12-Lead     Disposition:   FU with me in 4 months.   Patient is agreeable to this plan and will call if any problems develop in the interim.   SignedTruitt Merle, NP  02/13/2018 9:12  AM  [facility] [address] [place], [place]  [address] Phone: [phone] Fax: [phone]

## 2018-02-13 NOTE — Patient Instructions (Addendum)
We will be checking the following labs today - BMET, lipids and HPF   Medication Instructions:    Continue with your current medicines.     Testing/Procedures To Be Arranged:  N/A  Follow-Up:   See me in 4 months    Other Special Instructions:   N/A    If you need a refill on your cardiac medications before your next appointment, please call your pharmacy.   Call the [place] office at [phone] if you have any questions, problems or concerns.

## 2018-02-14 ENCOUNTER — Other Ambulatory Visit: Payer: Self-pay | Admitting: *Deleted

## 2018-02-28 DIAGNOSIS — M461 Sacroiliitis, not elsewhere classified: Secondary | ICD-10-CM | POA: Diagnosis not present

## 2018-02-28 DIAGNOSIS — M25551 Pain in right hip: Secondary | ICD-10-CM | POA: Diagnosis not present

## 2018-03-06 DIAGNOSIS — M47817 Spondylosis without myelopathy or radiculopathy, lumbosacral region: Secondary | ICD-10-CM | POA: Diagnosis not present

## 2018-03-06 DIAGNOSIS — M461 Sacroiliitis, not elsewhere classified: Secondary | ICD-10-CM | POA: Diagnosis not present

## 2018-03-06 DIAGNOSIS — M545 Low back pain: Secondary | ICD-10-CM | POA: Diagnosis not present

## 2018-03-11 DIAGNOSIS — M545 Low back pain: Secondary | ICD-10-CM | POA: Diagnosis not present

## 2018-03-14 DIAGNOSIS — M461 Sacroiliitis, not elsewhere classified: Secondary | ICD-10-CM | POA: Diagnosis not present

## 2018-03-14 DIAGNOSIS — M545 Low back pain: Secondary | ICD-10-CM | POA: Diagnosis not present

## 2018-03-17 DIAGNOSIS — Z23 Encounter for immunization: Secondary | ICD-10-CM | POA: Diagnosis not present

## 2018-03-24 DIAGNOSIS — M461 Sacroiliitis, not elsewhere classified: Secondary | ICD-10-CM | POA: Diagnosis not present

## 2018-03-24 DIAGNOSIS — M545 Low back pain: Secondary | ICD-10-CM | POA: Diagnosis not present

## 2018-03-27 ENCOUNTER — Other Ambulatory Visit: Payer: Self-pay | Admitting: Nurse Practitioner

## 2018-04-02 ENCOUNTER — Other Ambulatory Visit: Payer: Self-pay | Admitting: Nurse Practitioner

## 2018-04-09 ENCOUNTER — Other Ambulatory Visit: Payer: Self-pay | Admitting: Cardiovascular Disease

## 2018-04-11 NOTE — Telephone Encounter (Signed)
Pt's pharmacy is requesting a refill on ranitidine 300 mg tablet. Please address

## 2018-04-12 NOTE — Telephone Encounter (Signed)
Called pt and spoke with pt's wife and the wife stated that pt does not take pantoprazole 40 mg tablet. Pt only takes the ranitidine and would like for medication to be sent to the pt's pharmacy. Please address

## 2018-04-17 ENCOUNTER — Other Ambulatory Visit: Payer: Self-pay | Admitting: Nurse Practitioner

## 2018-04-24 DIAGNOSIS — M545 Low back pain: Secondary | ICD-10-CM | POA: Diagnosis not present

## 2018-04-24 DIAGNOSIS — M461 Sacroiliitis, not elsewhere classified: Secondary | ICD-10-CM | POA: Diagnosis not present

## 2018-05-29 ENCOUNTER — Other Ambulatory Visit: Payer: Self-pay | Admitting: Nurse Practitioner

## 2018-06-05 ENCOUNTER — Ambulatory Visit (INDEPENDENT_AMBULATORY_CARE_PROVIDER_SITE_OTHER): Payer: Medicare Other | Admitting: Nurse Practitioner

## 2018-06-05 ENCOUNTER — Encounter: Payer: Self-pay | Admitting: Nurse Practitioner

## 2018-06-05 VITALS — HR 57 | Ht 71.0 in | Wt 206.4 lb

## 2018-06-05 DIAGNOSIS — I25810 Atherosclerosis of coronary artery bypass graft(s) without angina pectoris: Secondary | ICD-10-CM | POA: Diagnosis not present

## 2018-06-05 DIAGNOSIS — I714 Abdominal aortic aneurysm, without rupture, unspecified: Secondary | ICD-10-CM

## 2018-06-05 DIAGNOSIS — I5032 Chronic diastolic (congestive) heart failure: Secondary | ICD-10-CM | POA: Diagnosis not present

## 2018-06-05 DIAGNOSIS — R0602 Shortness of breath: Secondary | ICD-10-CM | POA: Diagnosis not present

## 2018-06-05 DIAGNOSIS — R609 Edema, unspecified: Secondary | ICD-10-CM | POA: Diagnosis not present

## 2018-06-05 DIAGNOSIS — I10 Essential (primary) hypertension: Secondary | ICD-10-CM | POA: Diagnosis not present

## 2018-06-05 DIAGNOSIS — I5022 Chronic systolic (congestive) heart failure: Secondary | ICD-10-CM | POA: Diagnosis not present

## 2018-06-05 DIAGNOSIS — E7849 Other hyperlipidemia: Secondary | ICD-10-CM

## 2018-06-05 LAB — HEPATIC FUNCTION PANEL
ALT: 10 IU/L (ref 0–44)
AST: 14 IU/L (ref 0–40)
Albumin: 4.2 g/dL (ref 3.6–4.8)
Alkaline Phosphatase: 64 IU/L (ref 39–117)
Bilirubin Total: 1.3 mg/dL — ABNORMAL HIGH (ref 0.0–1.2)
Bilirubin, Direct: 0.36 mg/dL (ref 0.00–0.40)
Total Protein: 6.6 g/dL (ref 6.0–8.5)

## 2018-06-05 LAB — LIPID PANEL
Chol/HDL Ratio: 2.1 ratio (ref 0.0–5.0)
Cholesterol, Total: 148 mg/dL (ref 100–199)
HDL: 72 mg/dL (ref >39)
LDL Calculated: 66 mg/dL (ref 0–99)
Triglycerides: 48 mg/dL (ref 0–149)
VLDL Cholesterol Cal: 10 mg/dL (ref 5–40)

## 2018-06-05 LAB — CBC
Hematocrit: 26.4 % — ABNORMAL LOW (ref 37.5–51.0)
Hemoglobin: 9.4 g/dL — ABNORMAL LOW (ref 13.0–17.7)
MCH: 35.5 pg — ABNORMAL HIGH (ref 26.6–33.0)
MCHC: 35.6 g/dL (ref 31.5–35.7)
MCV: 100 fL — ABNORMAL HIGH (ref 79–97)
Platelets: 115 10*3/uL — ABNORMAL LOW (ref 150–450)
RBC: 2.65 x10E6/uL — CL (ref 4.14–5.80)
RDW: 12.2 % (ref 11.6–15.4)
WBC: 7.3 10*3/uL (ref 3.4–10.8)

## 2018-06-05 LAB — BASIC METABOLIC PANEL
BUN/Creatinine Ratio: 19 (ref 10–24)
BUN: 31 mg/dL — ABNORMAL HIGH (ref 8–27)
CO2: 22 mmol/L (ref 20–29)
Calcium: 9.3 mg/dL (ref 8.6–10.2)
Chloride: 92 mmol/L — ABNORMAL LOW (ref 96–106)
Creatinine, Ser: 1.67 mg/dL — ABNORMAL HIGH (ref 0.76–1.27)
GFR calc Af Amer: 48 mL/min/{1.73_m2} — ABNORMAL LOW (ref >59)
GFR calc non Af Amer: 41 mL/min/{1.73_m2} — ABNORMAL LOW (ref >59)
Glucose: 95 mg/dL (ref 65–99)
Potassium: 4.4 mmol/L (ref 3.5–5.2)
Sodium: 131 mmol/L — ABNORMAL LOW (ref 134–144)

## 2018-06-05 LAB — TSH: TSH: 0.72 u[IU]/mL (ref 0.450–4.500)

## 2018-06-05 NOTE — Patient Instructions (Addendum)
We will be checking the following labs today - BMET, CBC, HPF, Lipids and TSH   Medication Instructions:    Continue with your current medicines.    If you need a refill on your cardiac medications before your next appointment, please call your pharmacy.     Testing/Procedures To Be Arranged:  N/A  Follow-Up:   See me in 4 months    At [facility], you and your health needs are our priority.  As part of our continuing mission to provide you with exceptional heart care, we have created designated Provider Care Teams.  These Care Teams include your primary Cardiologist (physician) and Advanced Practice Providers (APPs -  Physician Assistants and Nurse Practitioners) who all work together to provide you with the care you need, when you need it.  Special Instructions:  . None  Call the [place] office at [phone] if you have any questions, problems or concerns.

## 2018-06-05 NOTE — Progress Notes (Signed)
CARDIOLOGY OFFICE NOTE  Date:  06/05/2018    [name] Date of Birth: 1951-01-23 Medical Record #[id number]  PCP:  Alroy Dust, L.Marlou Sa, MD  Cardiologist:  Jerel Shepherd    Chief Complaint  Patient presents with  . Coronary Artery Disease    4 month check. Seen for Dr. Burt Knack    History of Present Illness: [name] is a 68 y.o. male who presents today for a 4 month check. Seen for Dr. Burt Knack. Former patient of Dr. Susa Simmonds.   He has an extensive history of PVD and CAD. He was previously followed by Dr. Bridgett Larsson at [facility] and then went to Vascular in [facility] with Dr. Sammuel Hines. Prior cath in 2006 showing normal LV function with a totally occluded RCA with left to right collaterals. He has distal small vessel disease in the RCA with diffuse mild to moderate stenosis in the LAD. Former smoker. His PVD history is quite extensive and includes AAA (with no plans to intervene due to the nature of his aneurysm and his multiple comorbidities).    Other problems include HTN, HLD and OA. He has a history of hyponatremia as well.He had CABG x 5 by Dr. Cyndia Bent back in 1245 - complicated by DVT and volume overload.Was on IV heparin, transitioned to Xarelto due to drop in platelets, HIT panel + and serotonin release assay + as well and he was changed to Arixtra and seen by Dr. Alen Blew. Warfarin started and he wason 6 months of therapyand then transitioned back to Plavix..   I have seen him several times over the past few years. He has had intermittent issues with his BP. Has had intermittent issues with volume overload. He has periods of excessive alcohol use. He had not planned on going back to [facility] but a CT by ortho here showed theenlarginganeurysm - referred back to [facility]. There is no plan for surgical intervention.  Seen back in early Septemberof 2018- she had had shoulder surgery. He had had a fall. Had been found to have blood in his stool - saw GI - placedon PPI therapy, iron  and Vit C.He was to have arepeat stool test.Cardiac status was ok. BP was up a little but they followed it at home and called in with a diary which was improved.   Admitted back in [place] significant epistaxis - actually had to be transfused. ASA/Plavix stopped. BP was pretty high and he required several med changes.   Called almost immediately after that discharge- BP super low and we had to start cutting back. No further bleeding. Off his iron due to constipation. No further bleeding.BP were quite labile - sky high here. We have continued to make medicine adjustments.He has gotten more limited by his chronic back pain.   When I saw him in February of 2019 - he had seen Dr. Greer Pickerel for his hernia -apparently therewasto be a discussion with Dr. Sammuel Hines and Dr. Burt Knack about how to proceed - but Beckam and his wife knew that he is very high risk andhad no plans tointervene andweregoing to try a girdle. He had gained weight - eating excessively and continuing to drink alcohol.She is pretty much letting him have whatever he wants.They have deferred on bone marrow biopsy for his anemia. He is on iron. Drinking beer. Hernia continues to enlarge and becoming much more bothersome with pain and GI symptoms - they were planning on going back to [facility] to discuss options with general surgery there -  did not wish to see general surgery here any more.   Last visit with me was back in September of 2019 - they did NOT go back to [facility] - "too much of a production" and "no one is going to operate anyway" - so they had no plans to see anyone going forward.   Comes in today. Here withhis wife.I had cut his Lasix back in September due to worsening CKD - she has restarted it back daily for return of lower extremity edema - could not get his shoes on - she is going to try and cut the Lasix back again. Now back in his old shoes.  Seeing PCP tomorrow. He has had URI over the past 10 days and has  been in the bed. He has had some nose bleeding. He has used a humidifier - this is now improving.  He has also had a spell of where he could not walk for 3 weeks - had to use a wheelchair - given narcotics - had a "shot" in the back. This helped. He remains off all blood thinners. Fortunately, it did help. There was question about possible use of Celebrex - this would not be preferred by [place] due to progressive CKD/chronic diastolic HF. Apparently he has had another MRI for the back - that was sent to [facility]/Dr. Sammuel Hines - she is under the assumption that Dr. Sammuel Hines reviewed and did not wish to see them back (unknown if this is actually true).  BP has been running lower at home.    Past Medical History:  Diagnosis Date  . AAA (abdominal aortic aneurysm) ([place])   . Anxiety   . CHF (congestive heart failure) ([place])   . COPD (chronic obstructive pulmonary disease) ([place])   . Coronary artery disease   . GERD (gastroesophageal reflux disease)   . History of blood transfusion   . Hyperlipidemia   . Hypertension    takes meds daily  . Hyponatremia   . Lumbar degenerative disc disease   . Myocardial infarction ([place])   . PAD (peripheral artery disease) ([place])   . PONV (postoperative nausea and vomiting)   . Raynaud's disease /phenomenon   . Stroke [facility]) 10/15/2005    Past Surgical History:  Procedure Laterality Date  . ABDOMINAL ANGIOGRAM  03/30/2012  . ABDOMINAL AORTAGRAM N/A 03/30/2012   Procedure: ABDOMINAL Maxcine Ham;  Surgeon: Conrad Kouts, MD;  Location: [facility] CATH LAB;  Service: Cardiovascular;  Laterality: N/A;  . CARDIAC CATHETERIZATION  2006  . CARDIAC CATHETERIZATION N/A 12/09/2014   Procedure: Left Heart Cath and Coronary Angiography;  Surgeon: [name], MD;  Location: [facility] CV LAB;  Service: Cardiovascular;  Laterality: N/A;  . CAROTID ENDARTERECTOMY     bilateral  . CAROTID ENDARTERECTOMY  04/13/2005   Right and 06/03/2005 Left  . CORONARY ARTERY BYPASS GRAFT N/A 12/13/2014    Procedure: CORONARY ARTERY BYPASS GRAFT times five using left internal mammary artery and endoscopic right leg saphenous vein harvest;  Surgeon: Gaye Pollack, MD;  Location: [facility] OR;  Service: Open Heart Surgery;  Laterality: N/A;  . HERNIA REPAIR    . ILIAC ARTERY STENT  10/15/2005    Left common and external   . INTRAOPERATIVE TRANSESOPHAGEAL ECHOCARDIOGRAM N/A 12/13/2014   Procedure: INTRAOPERATIVE TRANSESOPHAGEAL ECHOCARDIOGRAM;  Surgeon: Gaye Pollack, MD;  Location[facility];  Service: Open Heart Surgery;  Laterality: N/A;     Medications: Current Meds  Medication Sig  . Ascorbic Acid (VITAMIN C) 100 MG tablet Take  100 mg by mouth daily.  . carvedilol (COREG) 25 MG tablet TAKE 1 AND 1/2 TABLETS BY MOUTH EVERY MORNING AND TAKE 1 TABLET EVERY AFTERNOON  . clobetasol ointment (TEMOVATE) 0.05 % apply to affected area twice a day if needed  . cloNIDine (CATAPRES) 0.2 MG tablet TAKE 1 TABLET BY MOUTH TWICE DAILY  . ferrous sulfate 325 (65 FE) MG EC tablet Take 325 mg by mouth daily with breakfast.  . folic acid (FOLVITE) 1 MG tablet Take 1 tablet (1 mg total) by mouth daily.  . furosemide (LASIX) 40 MG tablet Take 40 mg by mouth daily.  Marland Kitchen lisinopril (PRINIVIL,ZESTRIL) 20 MG tablet TAKE 1 TABLET BY MOUTH ONCE DAILY  . nitroGLYCERIN (NITROSTAT) 0.4 MG SL tablet Place 0.4 mg under the tongue every 5 (five) minutes as needed for chest pain.  . ranitidine (ZANTAC) 300 MG tablet Take 1 tablet (300 mg total) by mouth at bedtime.  . rosuvastatin (CRESTOR) 20 MG tablet TAKE 1 TABLET BY MOUTH AT BEDTIME  . Thiamine HCl (VITAMIN B-1) 100 MG tablet Take 100 mg by mouth daily.    . traMADol (ULTRAM) 50 MG tablet Take 1 tablet (50 mg total) by mouth every 6 (six) hours as needed.  . triamcinolone ointment (KENALOG) 0.1 % Apply 1 application topically 2 (two) times daily.  . [DISCONTINUED] docusate sodium (COLACE) 100 MG capsule Take 100 mg by mouth daily.  . [DISCONTINUED] furosemide (LASIX) 40 MG tablet  TAKE 1 TABLET BY MOUTH ONCE DAILY IF NEEDED FOR FLUID     Allergies: Allergies  Allergen Reactions  . Hctz [Hydrochlorothiazide] Other (See Comments)    Black out due to electrolytes  . Heparin Other (See Comments)    HIT positive as of 12/31/14  . Norvasc [Amlodipine Besylate] Swelling       . Pletal [Cilostazol] Diarrhea    Social History: The patient  reports that he quit smoking about 13 years ago. His smoking use included cigarettes. He has never used smokeless tobacco. He reports current alcohol use of about 24.0 standard drinks of alcohol per week. He reports that he does not use drugs.   Family History: The patient's family history includes Coronary artery disease (age of onset: 30) in his father; Dementia in his mother; Heart disease in his brother and father; Hyperlipidemia in his brother and father; Hypertension in his brother and father; Other (age of onset: 16) in his mother; Peripheral vascular disease in his father.   Review of Systems: Please see the history of present illness.   Otherwise, the review of systems is positive for none.   All other systems are reviewed and negative.   Physical Exam: VS:  Pulse (!) 57   Ht 5' 11"  (1.803 m)   Wt 206 lb 6.4 oz (93.6 kg)   SpO2 100% Comment: at rest  BMI 28.79 kg/m  .  BMI Body mass index is 28.79 kg/m.  Wt Readings from Last 3 Encounters:  06/05/18 206 lb 6.4 oz (93.6 kg)  02/13/18 207 lb 12.8 oz (94.3 kg)  02/01/18 211 lb 3.2 oz (95.8 kg)    BP is 150/80 by me.   General:  He looks puny today. Color is sallow. Alert and in no acute distress.   HEENT: Normal.  Neck: Supple, no JVD, carotid bruits, or masses noted.  Cardiac: Regular rate and rhythm. No murmurs, rubs, or gallops. No edema.  Respiratory:  Lungs are clear to auscultation bilaterally with normal work of breathing.  GI: Soft and  nontender. Hernia noted protruding thru his shirt.  MS: No deformity or atrophy. Gait and ROM intact.  Skin: Warm and  dry. Color is sallow Neuro:  Strength and sensation are intact and no gross focal deficits noted.  Psych: Alert, appropriate and with normal affect.   LABORATORY DATA:  EKG:  EKG is not ordered today.   Lab Results  Component Value Date   WBC 7.5 02/01/2018   HGB 9.4 (L) 02/01/2018   HCT 26.7 (L) 02/01/2018   PLT 92 (L) 02/01/2018   GLUCOSE 92 02/13/2018   CHOL 118 02/13/2018   TRIG 50 02/13/2018   HDL 66 02/13/2018   LDLCALC 42 02/13/2018   ALT 5 02/13/2018   AST 14 02/13/2018   NA 128 (L) 02/13/2018   K 4.0 02/13/2018   CL 89 (L) 02/13/2018   CREATININE 2.21 (H) 02/13/2018   BUN 26 02/13/2018   CO2 22 02/13/2018   TSH 0.60 08/05/2015   INR 0.98 02/25/2017   HGBA1C 5.4 12/12/2014     BNP (last 3 results) No results for input(s): BNP in the last 8760 hours.  ProBNP (last 3 results) Recent Labs    06/29/17 0849 07/07/17 0814  PROBNP 5,514* 3,519*     Other Studies Reviewed Today:  Limted CT 06/23/2016 Impressions    --Limited study in the absence of IV contrast.    --Mild interval enlargement of the infrarenal abdominal aneurysm measuring6.2 x 5.8 cm (previously 5.8 x 5.4 cm) .     --Stable left common iliac aneurysm.    --Moderate to severe atherosclerotic changes, as above, with patency and degree of stenosis unclear due to lack of contrast.    --Umbilical hernia including loop of bowel with no clear overlying skin, likely an ostomy however unclear by CT.        TEE Study Conclusions from 11/2014  - Left ventricle: Systolic function was mildly reduced. The  estimated ejection fraction was in the range of 45% to 50%.  Hypokinesis of the inferior myocardium. - Aortic valve: No evidence of vegetation. There was trivial  regurgitation. - Mitral valve: Systolic bowing without prolapse. There was mild  regurgitation. - Left atrium: No evidence of thrombus in the atrial cavity or  appendage. No evidence of thrombus in the  appendage. - Atrial septum: No defect or patent foramen ovale was identified. - Tricuspid valve: No evidence of vegetation.   Assessment/Plan:  1.CAD - prior CABG - managed conservatively - no active symptoms reported.   2. ChronicAnemia -he has seen hematology in the past - to be back on iron therapy  3.PriorEpisode ofEpistaxis-has had some recurrent nose bleeding - has been able to get stopped - most likely related to recent URI.   4. HTN - BP better at home than here - no changes made today  5. Chronic systolic HF - she has been adjusting his Lasix - need to recheck lab - I suspect excessive salt/alcohol as trigger  6. Prior RLE DVT and PE - treated with 6 months of Coumadin - this was stopped March 1st, 2017. Did have + HIT panel.   7.ChronicThrombocytopenia  - Heparin induced clinically and HIT Ab panel positive - Serotonin release assay positive as well and hence HIT confirmed - Heparin is listed as an allergy for him - he is back seeing Hematology.  8.  PAD/AAA --[facility] Marlou Porch there have been NO plans to intervene due to the complexity and expected poor outcome. The record notes that  his AAA is very complex and due to degree of calcification has been deemed not electively operable- there are no plans for surgical intervention at this time. I did ask her to call and confirm if recent MRI has been reviewed by Dr. Sammuel Hines.   9.EnlargingUmbilical hernia -see prior picture - he has been turned down by general surgery here and felt to be too high risk by Dr. Sammuel Hines, Burt Knack and myself. He has opted for a conservative approach - tells me again today that he has no plans for surgical intervention and continues "to do my bucket list". Still very tenuous situation.   11. Back pain- using Ultram PRN. Has had recent steroid injection with improvement.   12.  Hyponatremia - lab today  13. HLD - on statin - lab  today  His overall prognosis remains quite tenuous and poor. I approached the subject of palliative care at a prior visit - this was not discussed today.   Current medicines are reviewed with the patient today.  The patient does not have concerns regarding medicines other than what has been noted above.  The following changes have been made:  See above.  Labs/ tests ordered today include:    Orders Placed This Encounter  Procedures  . Basic metabolic panel  . CBC  . Hepatic function panel  . Lipid panel  . TSH     Disposition:   FU with me in 4 months.   Patient is agreeable to this plan and will call if any problems develop in the interim.   SignedTruitt Merle, NP  06/05/2018 8:57 AM  [place] [address] [place], [place]  [address] Phone: [phone] Fax: [phone]

## 2018-06-06 DIAGNOSIS — H00016 Hordeolum externum left eye, unspecified eyelid: Secondary | ICD-10-CM | POA: Diagnosis not present

## 2018-06-06 DIAGNOSIS — Z1211 Encounter for screening for malignant neoplasm of colon: Secondary | ICD-10-CM | POA: Diagnosis not present

## 2018-06-06 DIAGNOSIS — I10 Essential (primary) hypertension: Secondary | ICD-10-CM | POA: Diagnosis not present

## 2018-06-06 DIAGNOSIS — M545 Low back pain: Secondary | ICD-10-CM | POA: Diagnosis not present

## 2018-06-06 DIAGNOSIS — I719 Aortic aneurysm of unspecified site, without rupture: Secondary | ICD-10-CM | POA: Diagnosis not present

## 2018-06-06 DIAGNOSIS — K429 Umbilical hernia without obstruction or gangrene: Secondary | ICD-10-CM | POA: Diagnosis not present

## 2018-06-16 DIAGNOSIS — Z1211 Encounter for screening for malignant neoplasm of colon: Secondary | ICD-10-CM | POA: Diagnosis not present

## 2018-06-25 ENCOUNTER — Other Ambulatory Visit: Payer: Self-pay | Admitting: Cardiovascular Disease

## 2018-06-26 DIAGNOSIS — M545 Low back pain: Secondary | ICD-10-CM | POA: Diagnosis not present

## 2018-06-26 DIAGNOSIS — M461 Sacroiliitis, not elsewhere classified: Secondary | ICD-10-CM | POA: Diagnosis not present

## 2018-07-03 DIAGNOSIS — R195 Other fecal abnormalities: Secondary | ICD-10-CM | POA: Diagnosis not present

## 2018-07-03 DIAGNOSIS — R198 Other specified symptoms and signs involving the digestive system and abdomen: Secondary | ICD-10-CM | POA: Diagnosis not present

## 2018-07-31 ENCOUNTER — Telehealth: Payer: Self-pay | Admitting: Oncology

## 2018-07-31 NOTE — Telephone Encounter (Signed)
Called pt per 3/09 sch message - left message for patient to call back to r./s

## 2018-08-01 ENCOUNTER — Other Ambulatory Visit: Payer: Self-pay | Admitting: Cardiovascular Disease

## 2018-08-03 ENCOUNTER — Inpatient Hospital Stay: Payer: Medicare Other | Admitting: Oncology

## 2018-08-03 ENCOUNTER — Inpatient Hospital Stay: Payer: Medicare Other | Attending: Family Medicine

## 2018-08-31 DIAGNOSIS — M545 Low back pain: Secondary | ICD-10-CM | POA: Diagnosis not present

## 2018-08-31 DIAGNOSIS — M461 Sacroiliitis, not elsewhere classified: Secondary | ICD-10-CM | POA: Diagnosis not present

## 2018-09-28 DIAGNOSIS — M461 Sacroiliitis, not elsewhere classified: Secondary | ICD-10-CM | POA: Diagnosis not present

## 2018-10-04 ENCOUNTER — Other Ambulatory Visit: Payer: Self-pay

## 2018-10-04 ENCOUNTER — Emergency Department (HOSPITAL_COMMUNITY): Payer: Medicare Other

## 2018-10-04 ENCOUNTER — Inpatient Hospital Stay (HOSPITAL_COMMUNITY)
Admission: EM | Admit: 2018-10-04 | Discharge: 2018-10-08 | DRG: 305 | Disposition: A | Discharge status: Home Health | Payer: Medicare Other | Attending: Internal Medicine | Admitting: Internal Medicine

## 2018-10-04 ENCOUNTER — Encounter (HOSPITAL_COMMUNITY): Payer: Self-pay | Admitting: *Deleted

## 2018-10-04 DIAGNOSIS — K5900 Constipation, unspecified: Secondary | ICD-10-CM | POA: Diagnosis present

## 2018-10-04 DIAGNOSIS — N179 Acute kidney failure, unspecified: Secondary | ICD-10-CM | POA: Diagnosis present

## 2018-10-04 DIAGNOSIS — M5136 Other intervertebral disc degeneration, lumbar region: Secondary | ICD-10-CM | POA: Diagnosis present

## 2018-10-04 DIAGNOSIS — Z87891 Personal history of nicotine dependence: Secondary | ICD-10-CM | POA: Diagnosis not present

## 2018-10-04 DIAGNOSIS — K219 Gastro-esophageal reflux disease without esophagitis: Secondary | ICD-10-CM | POA: Diagnosis present

## 2018-10-04 DIAGNOSIS — Z8679 Personal history of other diseases of the circulatory system: Secondary | ICD-10-CM | POA: Diagnosis not present

## 2018-10-04 DIAGNOSIS — I252 Old myocardial infarction: Secondary | ICD-10-CM | POA: Diagnosis not present

## 2018-10-04 DIAGNOSIS — Z818 Family history of other mental and behavioral disorders: Secondary | ICD-10-CM

## 2018-10-04 DIAGNOSIS — I714 Abdominal aortic aneurysm, without rupture, unspecified: Secondary | ICD-10-CM

## 2018-10-04 DIAGNOSIS — G894 Chronic pain syndrome: Secondary | ICD-10-CM | POA: Diagnosis present

## 2018-10-04 DIAGNOSIS — I2510 Atherosclerotic heart disease of native coronary artery without angina pectoris: Secondary | ICD-10-CM | POA: Diagnosis present

## 2018-10-04 DIAGNOSIS — R069 Unspecified abnormalities of breathing: Secondary | ICD-10-CM

## 2018-10-04 DIAGNOSIS — Z79891 Long term (current) use of opiate analgesic: Secondary | ICD-10-CM

## 2018-10-04 DIAGNOSIS — Z8349 Family history of other endocrine, nutritional and metabolic diseases: Secondary | ICD-10-CM

## 2018-10-04 DIAGNOSIS — J449 Chronic obstructive pulmonary disease, unspecified: Secondary | ICD-10-CM | POA: Diagnosis present

## 2018-10-04 DIAGNOSIS — Z888 Allergy status to other drugs, medicaments and biological substances status: Secondary | ICD-10-CM

## 2018-10-04 DIAGNOSIS — I7300 Raynaud's syndrome without gangrene: Secondary | ICD-10-CM | POA: Diagnosis present

## 2018-10-04 DIAGNOSIS — Z79899 Other long term (current) drug therapy: Secondary | ICD-10-CM

## 2018-10-04 DIAGNOSIS — F419 Anxiety disorder, unspecified: Secondary | ICD-10-CM | POA: Diagnosis present

## 2018-10-04 DIAGNOSIS — I160 Hypertensive urgency: Secondary | ICD-10-CM | POA: Diagnosis not present

## 2018-10-04 DIAGNOSIS — E785 Hyperlipidemia, unspecified: Secondary | ICD-10-CM | POA: Diagnosis present

## 2018-10-04 DIAGNOSIS — I130 Hypertensive heart and chronic kidney disease with heart failure and stage 1 through stage 4 chronic kidney disease, or unspecified chronic kidney disease: Secondary | ICD-10-CM | POA: Diagnosis present

## 2018-10-04 DIAGNOSIS — N183 Chronic kidney disease, stage 3 (moderate): Secondary | ICD-10-CM | POA: Diagnosis present

## 2018-10-04 DIAGNOSIS — R109 Unspecified abdominal pain: Secondary | ICD-10-CM | POA: Diagnosis not present

## 2018-10-04 DIAGNOSIS — Z20828 Contact with and (suspected) exposure to other viral communicable diseases: Secondary | ICD-10-CM | POA: Diagnosis present

## 2018-10-04 DIAGNOSIS — Z66 Do not resuscitate: Secondary | ICD-10-CM | POA: Diagnosis present

## 2018-10-04 DIAGNOSIS — Z8673 Personal history of transient ischemic attack (TIA), and cerebral infarction without residual deficits: Secondary | ICD-10-CM

## 2018-10-04 DIAGNOSIS — I5032 Chronic diastolic (congestive) heart failure: Secondary | ICD-10-CM | POA: Diagnosis present

## 2018-10-04 DIAGNOSIS — E7800 Pure hypercholesterolemia, unspecified: Secondary | ICD-10-CM | POA: Diagnosis present

## 2018-10-04 DIAGNOSIS — Z951 Presence of aortocoronary bypass graft: Secondary | ICD-10-CM

## 2018-10-04 DIAGNOSIS — K429 Umbilical hernia without obstruction or gangrene: Secondary | ICD-10-CM

## 2018-10-04 DIAGNOSIS — I161 Hypertensive emergency: Secondary | ICD-10-CM | POA: Diagnosis not present

## 2018-10-04 DIAGNOSIS — E7849 Other hyperlipidemia: Secondary | ICD-10-CM | POA: Diagnosis not present

## 2018-10-04 DIAGNOSIS — Z8249 Family history of ischemic heart disease and other diseases of the circulatory system: Secondary | ICD-10-CM

## 2018-10-04 LAB — COMPREHENSIVE METABOLIC PANEL
ALT: 15 U/L (ref 0–44)
AST: 19 U/L (ref 15–41)
Albumin: 4.1 g/dL (ref 3.5–5.0)
Alkaline Phosphatase: 57 U/L (ref 38–126)
Anion gap: 15 (ref 5–15)
BUN: 45 mg/dL — ABNORMAL HIGH (ref 8–23)
CO2: 22 mmol/L (ref 22–32)
Calcium: 9.6 mg/dL (ref 8.9–10.3)
Chloride: 97 mmol/L — ABNORMAL LOW (ref 98–111)
Creatinine, Ser: 2.11 mg/dL — ABNORMAL HIGH (ref 0.61–1.24)
GFR calc Af Amer: 36 mL/min — ABNORMAL LOW (ref >60)
GFR calc non Af Amer: 31 mL/min — ABNORMAL LOW (ref >60)
Glucose, Bld: 132 mg/dL — ABNORMAL HIGH (ref 70–99)
Potassium: 3.8 mmol/L (ref 3.5–5.1)
Sodium: 134 mmol/L — ABNORMAL LOW (ref 135–145)
Total Bilirubin: 2 mg/dL — ABNORMAL HIGH (ref 0.3–1.2)
Total Protein: 7 g/dL (ref 6.5–8.1)

## 2018-10-04 LAB — URINALYSIS, MICROSCOPIC (REFLEX)
Bacteria, UA: NONE SEEN
WBC, UA: NONE SEEN WBC/hpf (ref 0–5)

## 2018-10-04 LAB — LIPASE, BLOOD: Lipase: 38 U/L (ref 11–51)

## 2018-10-04 LAB — URINALYSIS, ROUTINE W REFLEX MICROSCOPIC
Bilirubin Urine: NEGATIVE
Glucose, UA: NEGATIVE mg/dL
Ketones, ur: NEGATIVE mg/dL
Leukocytes,Ua: NEGATIVE
Nitrite: NEGATIVE
Protein, ur: 100 mg/dL — AB
Specific Gravity, Urine: 1.01 (ref 1.005–1.030)
pH: 6.5 (ref 5.0–8.0)

## 2018-10-04 LAB — CBC
HCT: 30.4 % — ABNORMAL LOW (ref 39.0–52.0)
Hemoglobin: 10.6 g/dL — ABNORMAL LOW (ref 13.0–17.0)
MCH: 34.1 pg — ABNORMAL HIGH (ref 26.0–34.0)
MCHC: 34.9 g/dL (ref 30.0–36.0)
MCV: 97.7 fL (ref 80.0–100.0)
Platelets: 165 10*3/uL (ref 150–400)
RBC: 3.11 MIL/uL — ABNORMAL LOW (ref 4.22–5.81)
RDW: 13.4 % (ref 11.5–15.5)
WBC: 17.9 10*3/uL — ABNORMAL HIGH (ref 4.0–10.5)
nRBC: 0 % (ref 0.0–0.2)

## 2018-10-04 LAB — TYPE AND SCREEN
ABO/RH(D): B POS
Antibody Screen: NEGATIVE

## 2018-10-04 LAB — GLUCOSE, CAPILLARY: Glucose-Capillary: 107 mg/dL — ABNORMAL HIGH (ref 70–99)

## 2018-10-04 LAB — LACTIC ACID, PLASMA
Lactic Acid, Venous: 1.1 mmol/L (ref 0.5–1.9)
Lactic Acid, Venous: 1.8 mmol/L (ref 0.5–1.9)

## 2018-10-04 LAB — SARS CORONAVIRUS 2 BY RT PCR (HOSPITAL ORDER, PERFORMED IN ~~LOC~~ HOSPITAL LAB): SARS Coronavirus 2: NEGATIVE

## 2018-10-04 LAB — I-STAT CREATININE, ED: Creatinine, Ser: 2 mg/dL — ABNORMAL HIGH (ref 0.61–1.24)

## 2018-10-04 MED ORDER — HYDROMORPHONE HCL 1 MG/ML IJ SOLN
1.0000 mg | Freq: Once | INTRAMUSCULAR | Status: AC
  Administered 2018-10-04: 1 mg via INTRAVENOUS
  Filled 2018-10-04: qty 1

## 2018-10-04 MED ORDER — FOLIC ACID 1 MG PO TABS
1.0000 mg | ORAL_TABLET | Freq: Every day | ORAL | Status: DC
  Administered 2018-10-05 – 2018-10-08 (×4): 1 mg via ORAL
  Filled 2018-10-04 (×4): qty 1

## 2018-10-04 MED ORDER — CARVEDILOL 25 MG PO TABS
37.0000 mg | ORAL_TABLET | Freq: Every morning | ORAL | Status: DC
  Administered 2018-10-05 – 2018-10-08 (×4): 37.5 mg via ORAL
  Filled 2018-10-04 (×4): qty 1

## 2018-10-04 MED ORDER — FERROUS SULFATE 325 (65 FE) MG PO TBEC: 325.0000 mg | DELAYED_RELEASE_TABLET | Freq: Every day | ORAL | Status: DC

## 2018-10-04 MED ORDER — FERROUS SULFATE 325 (65 FE) MG PO TABS
325.0000 mg | ORAL_TABLET | Freq: Every day | ORAL | Status: DC
  Administered 2018-10-05 – 2018-10-08 (×4): 325 mg via ORAL
  Filled 2018-10-04 (×4): qty 1

## 2018-10-04 MED ORDER — VITAMIN C 100 MG PO TABS: 100.0000 mg | ORAL_TABLET | Freq: Every day | ORAL | Status: DC

## 2018-10-04 MED ORDER — FENTANYL CITRATE (PF) 100 MCG/2ML IJ SOLN
25.0000 ug | INTRAMUSCULAR | Status: DC | PRN
  Administered 2018-10-05: 25 ug via INTRAVENOUS
  Administered 2018-10-05 – 2018-10-06 (×5): 50 ug via INTRAVENOUS
  Filled 2018-10-04 (×6): qty 2

## 2018-10-04 MED ORDER — CLEVIDIPINE BUTYRATE 0.5 MG/ML IV EMUL
0.0000 mg/h | INTRAVENOUS | Status: DC
  Administered 2018-10-04: 1 mg/h via INTRAVENOUS
  Administered 2018-10-05: 12 mg/h via INTRAVENOUS
  Administered 2018-10-05: 11 mg/h via INTRAVENOUS
  Administered 2018-10-05: 15 mg/h via INTRAVENOUS
  Administered 2018-10-05: 9 mg/h via INTRAVENOUS
  Administered 2018-10-06: 5 mg/h via INTRAVENOUS
  Filled 2018-10-04 (×4): qty 100
  Filled 2018-10-04: qty 50
  Filled 2018-10-04 (×3): qty 100
  Filled 2018-10-04 (×2): qty 50

## 2018-10-04 MED ORDER — CARVEDILOL 25 MG PO TABS
25.0000 mg | ORAL_TABLET | Freq: Every evening | ORAL | Status: DC
  Administered 2018-10-04 – 2018-10-07 (×4): 25 mg via ORAL
  Filled 2018-10-04 (×2): qty 2
  Filled 2018-10-04: qty 1
  Filled 2018-10-04: qty 2

## 2018-10-04 MED ORDER — ROSUVASTATIN CALCIUM 20 MG PO TABS
20.0000 mg | ORAL_TABLET | Freq: Every day | ORAL | Status: DC
  Administered 2018-10-04 – 2018-10-07 (×4): 20 mg via ORAL
  Filled 2018-10-04 (×4): qty 1

## 2018-10-04 MED ORDER — VITAMIN B-1 100 MG PO TABS
100.0000 mg | ORAL_TABLET | Freq: Every day | ORAL | Status: DC
  Administered 2018-10-05 – 2018-10-08 (×4): 100 mg via ORAL
  Filled 2018-10-04 (×4): qty 1

## 2018-10-04 MED ORDER — SODIUM CHLORIDE 0.9% FLUSH: 3.0000 mL | Freq: Once | INTRAVENOUS | Status: DC

## 2018-10-04 MED ORDER — HEPARIN SODIUM (PORCINE) 5000 UNIT/ML IJ SOLN: 5000.0000 [IU] | Freq: Three times a day (TID) | INTRAMUSCULAR | Status: DC

## 2018-10-04 MED ORDER — CLONIDINE HCL 0.2 MG PO TABS
0.2000 mg | ORAL_TABLET | Freq: Two times a day (BID) | ORAL | Status: DC
  Administered 2018-10-04 – 2018-10-08 (×8): 0.2 mg via ORAL
  Filled 2018-10-04 (×8): qty 1

## 2018-10-04 MED ORDER — NITROGLYCERIN 0.4 MG SL SUBL: 0.4000 mg | SUBLINGUAL_TABLET | SUBLINGUAL | Status: DC | PRN

## 2018-10-04 MED ORDER — FUROSEMIDE 40 MG PO TABS
40.0000 mg | ORAL_TABLET | Freq: Every day | ORAL | Status: DC
  Administered 2018-10-05 – 2018-10-08 (×4): 40 mg via ORAL
  Filled 2018-10-04 (×3): qty 1
  Filled 2018-10-04: qty 2
  Filled 2018-10-04: qty 1

## 2018-10-04 MED ORDER — HYDRALAZINE HCL 20 MG/ML IJ SOLN
10.0000 mg | Freq: Once | INTRAMUSCULAR | Status: AC
  Administered 2018-10-04: 10 mg via INTRAVENOUS
  Filled 2018-10-04: qty 1

## 2018-10-04 MED ORDER — FAMOTIDINE 20 MG PO TABS
20.0000 mg | ORAL_TABLET | Freq: Every day | ORAL | Status: DC
  Administered 2018-10-05: 20 mg via ORAL
  Filled 2018-10-04: qty 1

## 2018-10-04 MED ORDER — ONDANSETRON HCL 4 MG/2ML IJ SOLN
4.0000 mg | Freq: Once | INTRAMUSCULAR | Status: AC
  Administered 2018-10-04: 4 mg via INTRAVENOUS
  Filled 2018-10-04: qty 2

## 2018-10-04 MED ORDER — SODIUM CHLORIDE 0.9 % IV BOLUS
1000.0000 mL | Freq: Once | INTRAVENOUS | Status: AC
  Administered 2018-10-04: 1000 mL via INTRAVENOUS

## 2018-10-04 NOTE — ED Notes (Signed)
[phone] - Gae Bon (spouse)

## 2018-10-04 NOTE — ED Notes (Signed)
Nurse Navigator communication: with the wife Ezrah Panning, she was provided with an updated about the plan of care for the patient. She expresses her concern that he is normally seen in [place] with Vascular Surgeon Dante Gang for 9 years. Primary RN and EDP notified of this conversation.

## 2018-10-04 NOTE — ED Notes (Signed)
Report given via phone to [facility] in Medical ICU [phone]

## 2018-10-04 NOTE — ED Notes (Signed)
ED TO INPATIENT HANDOFF REPORT  ED Nurse Name and Phone #: William Hamburger RN [phone]  S Name/Age/Gender [name] 68 y.o. male Room/Bed: 031C/031C  Code Status   Code Status: DNR  Home/SNF/Other Home Patient oriented to: situation Is this baseline? Yes   Triage Complete: Triage complete  Chief Complaint Belly buttin pain  Triage Note Pt in c/o belly button pain, area is swollen and tender to touch, denies radiation into his back or any other areas, states he has a hernia that they have not been able to operate on due to its proximity to his AAA, no history of pain like this in the past, pt very uncomfortable, appears pale, hypertensive in triage   Allergies Allergies  Allergen Reactions  . Hctz [Hydrochlorothiazide] Other (See Comments)    Black out due to electrolytes  . Heparin Other (See Comments)    HIT positive as of 12/31/14  . Norvasc [Amlodipine Besylate] Swelling       . Pletal [Cilostazol] Diarrhea    Level of Care/Admitting Diagnosis ED Disposition    ED Disposition Condition [facility] Area: [place] [[id number]]  Level of Care: ICU [6]  Covid Evaluation: N/A  Diagnosis: Hypertensive urgency [694854]  Admitting Physician: Kandice Hams [[id number]]  Attending Physician: PCCM, MD [phone]  Estimated length of stay: 3 - 4 days  Certification:: I certify this patient will need inpatient services for at least 2 midnights  PT Class (Do Not Modify): Inpatient [101]  PT Acc Code (Do Not Modify): Private [1]       B Medical/Surgery History Past Medical History:  Diagnosis Date  . AAA (abdominal aortic aneurysm) ([place])   . Anxiety   . CHF (congestive heart failure) ([place])   . COPD (chronic obstructive pulmonary disease) ([place])   . Coronary artery disease   . GERD (gastroesophageal reflux disease)   . History of blood transfusion   . Hyperlipidemia   . Hypertension    takes meds daily  . Hyponatremia   . Lumbar  degenerative disc disease   . Myocardial infarction ([place])   . PAD (peripheral artery disease) ([place])   . PONV (postoperative nausea and vomiting)   . Raynaud's disease /phenomenon   . Stroke [facility]) 10/15/2005   Past Surgical History:  Procedure Laterality Date  . ABDOMINAL ANGIOGRAM  03/30/2012  . ABDOMINAL AORTAGRAM N/A 03/30/2012   Procedure: ABDOMINAL Maxcine Ham;  Surgeon: Conrad Cabell, MD;  Location: [facility] CATH LAB;  Service: Cardiovascular;  Laterality: N/A;  . CARDIAC CATHETERIZATION  2006  . CARDIAC CATHETERIZATION N/A 12/09/2014   Procedure: Left Heart Cath and Coronary Angiography;  Surgeon: [name], MD;  Location: [facility] CV LAB;  Service: Cardiovascular;  Laterality: N/A;  . CAROTID ENDARTERECTOMY     bilateral  . CAROTID ENDARTERECTOMY  04/13/2005   Right and 06/03/2005 Left  . CORONARY ARTERY BYPASS GRAFT N/A 12/13/2014   Procedure: CORONARY ARTERY BYPASS GRAFT times five using left internal mammary artery and endoscopic right leg saphenous vein harvest;  Surgeon: Gaye Pollack, MD;  Location: [facility] OR;  Service: Open Heart Surgery;  Laterality: N/A;  . HERNIA REPAIR    . ILIAC ARTERY STENT  10/15/2005    Left common and external   . INTRAOPERATIVE TRANSESOPHAGEAL ECHOCARDIOGRAM N/A 12/13/2014   Procedure: INTRAOPERATIVE TRANSESOPHAGEAL ECHOCARDIOGRAM;  Surgeon: Gaye Pollack, MD;  Location[facility];  Service: Open Heart Surgery;  Laterality: N/A;     A IV Location/Drains/Wounds Patient Lines/Drains/Airways  Status   Active Line/Drains/Airways    Name:   Placement date:   Placement time:   Site:   Days:   Peripheral IV 10/04/18 Right Antecubital   10/04/18    1740    Antecubital   less than 1          Intake/Output Last 24 hours  Intake/Output Summary (Last 24 hours) at 10/04/2018 2144 Last data filed at 10/04/2018 2020 Gross per 24 hour  Intake 1000 ml  Output 0 ml  Net 1000 ml    Labs/Imaging Results for orders placed or performed during the hospital encounter  of 10/04/18 (from the past 48 hour(s))  Urinalysis, Routine w reflex microscopic     Status: Abnormal   Collection Time: 10/04/18  5:11 PM  Result Value Ref Range   Color, Urine YELLOW YELLOW   APPearance CLEAR CLEAR   Specific Gravity, Urine 1.010 1.005 - 1.030   pH 6.5 5.0 - 8.0   Glucose, UA NEGATIVE NEGATIVE mg/dL   Hgb urine dipstick MODERATE (A) NEGATIVE   Bilirubin Urine NEGATIVE NEGATIVE   Ketones, ur NEGATIVE NEGATIVE mg/dL   Protein, ur 100 (A) NEGATIVE mg/dL   Nitrite NEGATIVE NEGATIVE   Leukocytes,Ua NEGATIVE NEGATIVE    Comment: Performed at [facility], [place] [address], [place], [place] [address]  Urinalysis, Microscopic (reflex)     Status: None   Collection Time: 10/04/18  5:11 PM  Result Value Ref Range   RBC / HPF 0-5 0 - 5 RBC/hpf   WBC, UA NONE SEEN 0 - 5 WBC/hpf   Bacteria, UA NONE SEEN NONE SEEN   Squamous Epithelial / LPF 0-5 0 - 5    Comment: Performed at [facility], [place] [address], [place], [place] [address]  Lipase, blood     Status: None   Collection Time: 10/04/18  5:45 PM  Result Value Ref Range   Lipase 38 11 - 51 U/L    Comment: Performed at [facility], [place] [address], [place],  [place] [address]  Comprehensive metabolic panel     Status: Abnormal   Collection Time: 10/04/18  5:45 PM  Result Value Ref Range   Sodium 134 (L) 135 - 145 mmol/L   Potassium 3.8 3.5 - 5.1 mmol/L   Chloride 97 (L) 98 - 111 mmol/L   CO2 22 22 - 32 mmol/L   Glucose, Bld 132 (H) 70 - 99 mg/dL   BUN 45 (H) 8 - 23 mg/dL   Creatinine, Ser 2.11 (H) 0.61 - 1.24 mg/dL   Calcium 9.6 8.9 - 10.3 mg/dL   Total Protein 7.0 6.5 - 8.1 g/dL   Albumin 4.1 3.5 - 5.0 g/dL   AST 19 15 - 41 U/L   ALT 15 0 - 44 U/L   Alkaline Phosphatase 57 38 - 126 U/L   Total Bilirubin 2.0 (H) 0.3 - 1.2 mg/dL   GFR calc non Af Amer 31 (L) >60 mL/min   GFR calc Af Amer 36 (L) >60 mL/min   Anion gap 15 5 - 15    Comment: Performed at [facility], [place] [address],  [place], [place] [address]  CBC     Status: Abnormal   Collection Time: 10/04/18  5:45 PM  Result Value Ref Range   WBC 17.9 (H) 4.0 - 10.5 K/uL   RBC 3.11 (L) 4.22 - 5.81 MIL/uL   Hemoglobin 10.6 (L) 13.0 - 17.0 g/dL   HCT 30.4 (L) 39.0 -  52.0 %   MCV 97.7 80.0 - 100.0 fL   MCH 34.1 (H) 26.0 - 34.0 pg   MCHC 34.9 30.0 - 36.0 g/dL   RDW 13.4 11.5 - 15.5 %   Platelets 165 150 - 400 K/uL   nRBC 0.0 0.0 - 0.2 %    Comment: Performed at [facility], [place] [address], [place], [place] [address]  Lactic acid, plasma     Status: None   Collection Time: 10/04/18  5:45 PM  Result Value Ref Range   Lactic Acid, Venous 1.1 0.5 - 1.9 mmol/L    Comment: Performed at [facility] [address], [place], [place] [address]  I-stat Creatinine, ED     Status: Abnormal   Collection Time: 10/04/18  5:50 PM  Result Value Ref Range   Creatinine, Ser 2.00 (H) 0.61 - 1.24 mg/dL  Type and screen [place]     Status: None   Collection Time: 10/04/18  7:31 PM  Result Value Ref Range   ABO/RH(D) B POS    Antibody Screen NEG    Sample Expiration      10/07/2018,2359 Performed at [facility], [place] [address], [place], [place] [address]    Ct Abdomen Pelvis Wo Contrast  Result Date: 10/04/2018 CLINICAL DATA:  Hernia. EXAM: CT ABDOMEN AND PELVIS WITHOUT CONTRAST TECHNIQUE: Multidetector CT imaging of the abdomen and pelvis was performed following the standard protocol without IV contrast. COMPARISON:  CT abdomen pelvis dated April 14, 2012. FINDINGS: Lower chest: No acute abnormality. Hepatobiliary: No focal liver abnormality. Tiny gallstone. No gallbladder wall thickening or biliary dilatation. Pancreas: Unremarkable. No pancreatic ductal dilatation or surrounding inflammatory changes. Spleen: Normal in size without focal abnormality. Adrenals/Urinary Tract: The adrenal glands are unremarkable. Bilateral renal atrophy with perinephric fat stranding. 1.6 cm right renal simple  cyst. No renal calculi or hydronephrosis. The bladder is unremarkable for the degree of distention. Stomach/Bowel: Stomach is within normal limits. Appendix appears normal. No evidence of bowel wall thickening, distention, or inflammatory changes. Vascular/Lymphatic: Interval enlargement of the infrarenal abdominal aortic aneurysm, now measuring 8.0 cm, previously 4.9 cm. Aortoiliac atherosclerosis. No enlarged abdominal or pelvic lymph nodes. Reproductive: Prostate is unremarkable. Other: Moderate fat containing umbilical hernia. Small fat containing right inguinal hernia. No free fluid or pneumoperitoneum. Musculoskeletal: No acute or significant osseous findings. IMPRESSION: 1. Moderate fat containing umbilical hernia. Small fat containing right inguinal hernia. No inflammatory changes within the hernias. 2. 8.0 cm infrarenal abdominal aortic aneurysm. Vascular surgery consultation recommended due to increased risk of rupture for AAA >5.5 cm. This recommendation follows ACR consensus guidelines: White Paper of the ACR Incidental Findings Committee II on Vascular Findings. J Am Coll Radiol 2013; 10:789-794. Aortic aneurysm NOS (ICD10-I71.9) 3.  Aortic atherosclerosis (ICD10-I70.0). 4. Cholelithiasis. Electronically Signed   By: Titus Dubin M.D.   On: 10/04/2018 18:40    Pending Labs Unresulted Labs (From admission, onward)    Start     Ordered   10/05/18 0500  CBC  Tomorrow morning,   R     10/04/18 2130   10/05/18 5176  Basic metabolic panel  Tomorrow morning,   R     10/04/18 2130   10/05/18 0500  Magnesium  Tomorrow morning,   R     10/04/18 2130   10/05/18 0500  Phosphorus  Tomorrow morning,   R     10/04/18 2130   10/04/18 1952  SARS Coronavirus 2 (CEPHEID - Performed  in [place] lab), [place]  (Asymptomatic Patients Labs)  Once,   R    Question:  Rule Out  Answer:  Yes   10/04/18 1951   10/04/18 1738  Lactic acid, plasma  Now then every 2 hours,   STAT     10/04/18 1737           Vitals/Pain Today's Vitals   10/04/18 2045 10/04/18 2100 10/04/18 2108 10/04/18 2111  BP: 134/78 132/73  128/76  Pulse: 69 71  71  Resp: 16 18  15   Temp:      TempSrc:      SpO2: (!) 89% 95%  95%  Weight:      Height:      PainSc:   5      Isolation Precautions No active isolations  Medications Medications  sodium chloride flush (NS) 0.9 % injection 3 mL (3 mLs Intravenous Not Given 10/04/18 1939)  clevidipine (CLEVIPREX) infusion 0.5 mg/mL (8 mg/hr Intravenous Rate/Dose Change 10/04/18 2021)  fentaNYL (SUBLIMAZE) injection 25-50 mcg (has no administration in time range)  cloNIDine (CATAPRES) tablet 0.2 mg (has no administration in time range)  ferrous sulfate EC tablet 325 mg (has no administration in time range)  folic acid (FOLVITE) tablet 1 mg (has no administration in time range)  furosemide (LASIX) tablet 40 mg (has no administration in time range)  nitroGLYCERIN (NITROSTAT) SL tablet 0.4 mg (has no administration in time range)  famotidine (PEPCID) tablet 20 mg (has no administration in time range)  rosuvastatin (CRESTOR) tablet 20 mg (has no administration in time range)  thiamine (VITAMIN B-1) tablet 100 mg (has no administration in time range)  vitamin C tablet 100 mg (has no administration in time range)  carvedilol (COREG) tablet 37.5 mg (has no administration in time range)  carvedilol (COREG) tablet 25 mg (has no administration in time range)  ondansetron (ZOFRAN) injection 4 mg (4 mg Intravenous Given 10/04/18 1753)  HYDROmorphone (DILAUDID) injection 1 mg (1 mg Intravenous Given 10/04/18 1753)  sodium chloride 0.9 % bolus 1,000 mL (0 mLs Intravenous Stopped 10/04/18 2020)  HYDROmorphone (DILAUDID) injection 1 mg (1 mg Intravenous Given 10/04/18 1925)  hydrALAZINE (APRESOLINE) injection 10 mg (10 mg Intravenous Given 10/04/18 1934)  HYDROmorphone (DILAUDID) injection 1 mg (1 mg Intravenous Given 10/04/18 2108)    Mobility walks Moderate fall risk    Focused Assessments Cardiac Assessment Handoff:  Cardiac Rhythm: Normal sinus rhythm Lab Results  Component Value Date   CKTOTAL 43 (L) 12/27/2014   TROPONINI 7.83 ([place]) 12/08/2014   No results found for: DDIMER Does the Patient currently have chest pain? No     R Recommendations: See Admitting Provider Note  Report given to:   Additional Notes:  Current drip: Cleviprex

## 2018-10-04 NOTE — Consult Note (Signed)
[facility] [place]  Patient name: [name] MRN: [id number] DOB: 01/13/1951 Sex: male   REQUESTING PROVIDER:    ER   REASON FOR CONSULT:    AAA  HISTORY OF PRESENT ILLNESS:   [name] is a 68 y.o. male, who presented to the emergency department with a 2-day history of abdominal pain.  The patient was originally scheduled for surgery of his aneurysm in 2013 by Dr. Bridgett Larsson, however the procedure was canceled due to concerns over the aortic neck and he was referred to [facility].  He has been seen in [place] since then, however the last time was in 2018.  Approximately 8 years ago he decided against open surgery, and understood that his aneurysm eventually would rupture which would be fatal.  He states that he has had a good years and is not interested in repair at this time.  The patient did have a back injection 3 days ago for his degenerative disc disease.  He suffers from coronary artery disease, status post CABG.  He has COPD but is not on home oxygen.  He is a former smoker.  He is on a statin for hypercholesterolemia and is medically managed for hypertension.  He has chronic renal insufficiency with a creatinine of 2.  He has a umbilical hernia which is been deemed an operable because of his comorbidities, in particular his abdominal aneurysm.  He has issues with constipation.  His last bowel movement was this morning.  He is not on any antiplatelet  Medications due to bleeding.  PAST MEDICAL HISTORY    Past Medical History:  Diagnosis Date  . AAA (abdominal aortic aneurysm) ([place])   . Anxiety   . CHF (congestive heart failure) ([place])   . COPD (chronic obstructive pulmonary disease) ([place])   . Coronary artery disease   . GERD (gastroesophageal reflux disease)   . History of blood transfusion   . Hyperlipidemia   . Hypertension    takes meds daily  . Hyponatremia   . Lumbar degenerative disc disease   .  Myocardial infarction ([place])   . PAD (peripheral artery disease) ([place])   . PONV (postoperative nausea and vomiting)   . Raynaud's disease /phenomenon   . Stroke [facility]) 10/15/2005     FAMILY HISTORY   Family History  Problem Relation Age of Onset  . Coronary artery disease Father 59       HTN, Hyperlipidemia died  . Heart disease Father        Heart Disease before age 8  . Hypertension Father   . Hyperlipidemia Father   . Peripheral vascular disease Father   . Other Mother 68        died rheumatoid arthritis  . Dementia Mother   . Heart disease Brother   . Hypertension Brother   . Hyperlipidemia Brother     SOCIAL HISTORY:   Social History   Socioeconomic History  . Marital status: Married    Spouse name: Not on file  . Number of children: Not on file  . Years of education: Not on file  . Highest education level: Not on file  Occupational History  . Occupation: Freight forwarder. owns Education officer, community: [employer]  . Financial resource strain: Not on file  . Food insecurity:    Worry: Not on file    Inability: Not on file  . Transportation needs:    Medical: Not on file  Non-medical: Not on file  Tobacco Use  . Smoking status: Former Smoker    Types: Cigarettes    Last attempt to quit: 05/24/2005    Years since quitting: 13.3  . Smokeless tobacco: Never Used  Substance and Sexual Activity  . Alcohol use: Yes    Alcohol/week: 24.0 standard drinks    Types: 24 Cans of beer per week    Comment: approximately 24 beer a week  . Drug use: No  . Sexual activity: Not on file  Lifestyle  . Physical activity:    Days per week: Not on file    Minutes per session: Not on file  . Stress: Not on file  Relationships  . Social connections:    Talks on phone: Not on file    Gets together: Not on file    Attends religious service: Not on file    Active member of club or organization: Not on file    Attends meetings of clubs or organizations: Not  on file    Relationship status: Not on file  . Intimate partner violence:    Fear of current or ex partner: Not on file    Emotionally abused: Not on file    Physically abused: Not on file    Forced sexual activity: Not on file  Other Topics Concern  . Not on file  Social History Narrative  . Not on file    ALLERGIES:    Allergies  Allergen Reactions  . Hctz [Hydrochlorothiazide] Other (See Comments)    Black out due to electrolytes  . Heparin Other (See Comments)    HIT positive as of 12/31/14  . Norvasc [Amlodipine Besylate] Swelling       . Pletal [Cilostazol] Diarrhea    CURRENT MEDICATIONS:    Current Facility-Administered Medications  Medication Dose Route Frequency Provider Last Rate Last Dose  . carvedilol (COREG) tablet 25 mg  25 mg Oral QPM Desai, Rahul P, PA-C      . [START ON 10/05/2018] carvedilol (COREG) tablet 37.5 mg  37.5 mg Oral q morning - 10a Desai, Rahul P, PA-C      . clevidipine (CLEVIPREX) infusion 0.5 mg/mL  0-21 mg/hr Intravenous Continuous Sherwood Gambler, MD 16 mL/hr at 10/04/18 2021 8 mg/hr at 10/04/18 2021  . cloNIDine (CATAPRES) tablet 0.2 mg  0.2 mg Oral BID Desai, Rahul P, PA-C      . famotidine (PEPCID) tablet 20 mg  20 mg Oral Daily Desai, Rahul P, PA-C      . fentaNYL (SUBLIMAZE) injection 25-50 mcg  25-50 mcg Intravenous Q2H PRN Shearon Stalls, Rahul P, PA-C      . [START ON 10/05/2018] ferrous sulfate EC tablet 325 mg  325 mg Oral Q breakfast Desai, Rahul P, PA-C      . folic acid (FOLVITE) tablet 1 mg  1 mg Oral Daily Desai, Rahul P, PA-C      . furosemide (LASIX) tablet 40 mg  40 mg Oral Daily Desai, Rahul P, PA-C      . heparin injection 5,000 Units  5,000 Units Subcutaneous Q8H Desai, Rahul P, PA-C      . nitroGLYCERIN (NITROSTAT) SL tablet 0.4 mg  0.4 mg Sublingual Q5 min PRN Desai, Rahul P, PA-C      . rosuvastatin (CRESTOR) tablet 20 mg  20 mg Oral QHS Desai, Rahul P, PA-C      . sodium chloride flush (NS) 0.9 % injection 3 mL  3 mL  Intravenous Once Sherwood Gambler, MD      .  thiamine (VITAMIN B-1) tablet 100 mg  100 mg Oral Daily Desai, Rahul P, PA-C      . vitamin C tablet 100 mg  100 mg Oral Daily Desai, Rahul P, PA-C       Current Outpatient Medications  Medication Sig Dispense Refill  . Ascorbic Acid (VITAMIN C) 100 MG tablet Take 100 mg by mouth daily.    . carvedilol (COREG) 25 MG tablet TAKE 1 AND 1/2 TABLETS BY MOUTH EVERY MORNING AND TAKE 1 TABLET EVERY AFTERNOON 75 tablet 8  . clobetasol ointment (TEMOVATE) 0.05 % apply to affected area twice a day if needed  0  . cloNIDine (CATAPRES) 0.2 MG tablet TAKE 1 TABLET BY MOUTH TWICE DAILY 60 tablet 10  . ferrous sulfate 325 (65 FE) MG EC tablet Take 325 mg by mouth daily with breakfast.    . folic acid (FOLVITE) 1 MG tablet Take 1 tablet (1 mg total) by mouth daily. 30 tablet 0  . furosemide (LASIX) 40 MG tablet Take 40 mg by mouth daily.    Mar[place] Kitchen lisinopril (PRINIVIL,ZESTRIL) 20 MG tablet TAKE 1 TABLET BY MOUTH ONCE DAILY 90 tablet 2  . nitroGLYCERIN (NITROSTAT) 0.4 MG SL tablet Place 0.4 mg under the tongue every 5 (five) minutes as needed for chest pain.    . ranitidine (ZANTAC) 300 MG tablet TAKE 1 TABLET BY MOUTH AT BEDTIME 30 tablet 11  . rosuvastatin (CRESTOR) 20 MG tablet TAKE 1 TABLET BY MOUTH AT BEDTIME 90 tablet 2  . Thiamine HCl (VITAMIN B-1) 100 MG tablet Take 100 mg by mouth daily.      . traMADol (ULTRAM) 50 MG tablet Take 1 tablet (50 mg total) by mouth every 6 (six) hours as needed. 30 tablet 0  . triamcinolone ointment (KENALOG) 0.1 % Apply 1 application topically 2 (two) times daily. 80 g 1    REVIEW OF SYSTEMS:   [X]  denotes positive finding, [ ]  denotes negative finding Cardiac  Comments:  Chest pain or chest pressure:    Shortness of breath upon exertion: x   Short of breath when lying flat:    Irregular heart rhythm:        Vascular    Pain in calf, thigh, or hip brought on by ambulation: x   Pain in feet at night that wakes you up from  your sleep:     Blood clot in your veins:    Leg swelling:  x       Pulmonary    Oxygen at home:    Productive cough:     Wheezing:         Neurologic    Sudden weakness in arms or legs:     Sudden numbness in arms or legs:     Sudden onset of difficulty speaking or slurred speech:    Temporary loss of vision in one eye:     Problems with dizziness:         Gastrointestinal    Blood in stool:      Vomited blood:         Genitourinary    Burning when urinating:     Blood in urine:        Psychiatric    Major depression:         Hematologic    Bleeding problems:    Problems with blood clotting too easily:        Skin    Rashes or ulcers:  Constitutional    Fever or chills:     PHYSICAL EXAM:   Vitals:   10/04/18 2030 10/04/18 2045 10/04/18 2100 10/04/18 2111  BP: 128/82 134/78 132/73 128/76  Pulse: 74 69 71 71  Resp: 19 16 18 15   Temp:      TempSrc:      SpO2: 91% (!) 89% 95% 95%  Weight:      Height:        GENERAL: The patient is a well-nourished male, in no acute distress. The vital signs are documented above. CARDIAC: There is a regular rate and rhythm.  VASCULAR: Palpable left femoral pulse.  Nonpalpable right femoral pulse.  Bilateral edema PULMONARY: Nonlabored respirations ABDOMEN: incarcerated umbilical hernia which I reduced.  He has a palpable abdominal mass with generalized tenderness.  MUSCULOSKELETAL: There are no major deformities or cyanosis. NEUROLOGIC: No focal weakness or paresthesias are detected. SKIN: There are no ulcers or rashes noted. PSYCHIATRIC: The patient has a normal affect.  STUDIES:   I have reviewed his CT scan with the following findings:  1. Moderate fat containing umbilical hernia. Small fat containing right inguinal hernia. No inflammatory changes within the hernias. 2. 8.0 cm infrarenal abdominal aortic aneurysm. Vascular surgery consultation recommended due to increased risk of rupture for AAA >5.5 cm.  This recommendation follows ACR consensus guidelines: White Paper of the ACR Incidental Findings Committee II on Vascular Findings. J Am Coll Radiol 2013; 10:789-794. Aortic aneurysm NOS (ICD10-I71.9) 3.  Aortic atherosclerosis (ICD10-I70.0). 4. Cholelithiasis.  ASSESSMENT and PLAN   AAA:  I had a lengthy conversation with the patient and his wife.  I feel that the only way to repair his aneurysm is via an open aortobifemoral bypass graft.  This will be very difficult given the extensive amount of calcification he has around his visceral vessels.  It will likely be difficult to place a proximal clamp.  He would need an aortic endarterectomy at the level of the renals.  He would likely have significant renal ischemia time, and I feel certain that he would develop worsening renal insufficiency likely leading to dialysis.  His postoperative course would be protracted and his quality of life would be very poor.  The patient feels that he has had a good past 8 years and thinks that death from a ruptured aneurysm is a good way to go.  I told him that if he were to rupture tonight that he would not be a candidate for urgent repair.  We have agreed for admission for blood pressure control and pain management.  I will reevaluate him in the morning, however at this time he will be DNR with no plans for surgical repair.   Leia Alf, MD, FACS [facility] [phone] Pager [phone]

## 2018-10-04 NOTE — ED Notes (Signed)
Spoke with Apolonio Schneiders in pharmacy about titration of drug

## 2018-10-04 NOTE — H&P (Signed)
NAME:  [name], MRN:  [id number], DOB:  February 02, 1951, LOS: 0 ADMISSION DATE:  10/04/2018, CONSULTATION DATE:  10/04/18 REFERRING MD:  Regenia Skeeter  CHIEF COMPLAINT:  Abd pain   Brief History   [name] is a 68 y.o. male who was admitted 5/13 with hypertensive emergency with presenting BP of 255/113.  He was started on cleviprex and admitted to the ICU.  History of present illness   [name] is a 68 y.o. male who has a PMH as outlined below including but not limited to CAD with MI s/p CABG, infrarenal AAA (followed at [facility]) abdominal hernias not amenable to surgery,  HTN, HLD, CHF, COPD (see "past medical history" for rest).  He presented to [facility] ED 5/13 with severe abdominal pain that began earlier that day.  Pain located near his umbilical hernia which is chronic; however, it has not been amenable to surgery due to close proximity to known infrarenal AAA (was initially scheduled for surgery in 2013 but was then deferred to concerns over aortic neck and he was referred to [facility] where he was last seen in 2018).  Pt apparently refused surgery roughly 7-8 years ago despite being told that aneurysm would eventually rupture.  In ED, he was found to have hypertensive emergency with presenting BP of 255/113.  He was started on cleviprex.  CT of the abdomen demonstrated 8cm infrarenal AAA.  Vascular surgery was consulted and medical management was recommended.   He had a prednisone shot in his back a few days ago (gets them every few weeks).  Otherwise, no new meds or events.  Per wife, pt had missed his doses of clonidine over the past 3 days.  Also missed PM dose of carvedilol for the past 3 days.  Pt has advanced directives which include full DNR.  This was discussed with pt and wife who both agree.  If BP worsens or fails to improve with IV meds, then will transition to comfort care (though currently has already improved on cleviprex).  Past Medical History  CAD with MI s/p CABG,  infrarenal AAA (followed at [facility]) abdominal hernias not amenable to surgery,  HTN, HLD, CHF, COPD, anxiety, DDD, CVA.  Significant Hospital Events   5/13 > admit.  Consults:  Vascular surgery.  Procedures:  None.  Significant Diagnostic Tests:  CT A / P 5/13 > 8cm infrarenal AAA.  Micro Data:  None.  Antimicrobials:  None.   Interim history/subjective:  Currently comfortable.  BP improved to 120 / 75 on cleviprex.  Objective:  Blood pressure 128/76, pulse 71, temperature 97.8 F (36.6 C), temperature source Oral, resp. rate 15, height 5\' 10"  (1.778 m), weight 90.7 kg, SpO2 95 %.        Intake/Output Summary (Last 24 hours) at 10/04/2018 2139 Last data filed at 10/04/2018 2020 Gross per 24 hour  Intake 1000 ml  Output 0 ml  Net 1000 ml   Filed Weights   10/04/18 1750  Weight: 90.7 kg    Examination: General: Adult male, chronically ill appearing, in NAD. Neuro: A&O x 3, no deficits.   Hard of hearing. HEENT: [place]/AT. Sclerae anicteric.  EOMI. Cardiovascular: RRR, no M/R/G.  Lungs: Respirations even and unlabored.  CTA bilaterally, No W/R/R.  Abdomen: Large umbilical hernia that is reducible.  BS x 4, Abdomen soft and tender.   Musculoskeletal: No gross deformities, 1+ edema.  Skin: Intact, warm, no rashes.  Assessment & Plan:   Hypertensive emergency (presenting BP 255/113). -  Continue cleviprex for now, goal SBP ~ 180 for first few hours then aim for ~ 140 (unsure of what his baseline BP runs). - Transition cleviprex to off once PO meds kick in. - Resume preadmission carvedilol, clonidine (consider change from PO to patch upon discharge given compliance issues), furosemide. - Hold preadmission lisinopril given AKI.  Hx CHF, HLD. - Continue preadmission furosemide, rosuvastatin.  AKI - presumed due to hypertensive emergency + ACEi. - Hold lisinopril. - Follow BMP.  Chronic abdominal hernia. - No interventions required.   Best Practice:  Diet: Heart  healthy. Pain/Anxiety/Delirium protocol (if indicated): Fentanyl PRN for pain. VAP protocol (if indicated): None. DVT prophylaxis: SCD's. GI prophylaxis: None. Glucose control: None. Mobility: Bedrest. Code Status: DNR. Family Communication: Wife updated. Disposition: ICU while on cleviprex.  Labs   CBC: Recent Labs  Lab 10/04/18 1745  WBC 17.9*  HGB 10.6*  HCT 30.4*  MCV 97.7  PLT 417   Basic Metabolic Panel: Recent Labs  Lab 10/04/18 1745 10/04/18 1750  NA 134*  --   K 3.8  --   CL 97*  --   CO2 22  --   GLUCOSE 132*  --   BUN 45*  --   CREATININE 2.11* 2.00*  CALCIUM 9.6  --    GFR: Estimated Creatinine Clearance: 38.9 mL/min (A) (by C-G formula based on SCr of 2 mg/dL (H)). Recent Labs  Lab 10/04/18 1745  WBC 17.9*  LATICACIDVEN 1.1   Liver Function Tests: Recent Labs  Lab 10/04/18 1745  AST 19  ALT 15  ALKPHOS 57  BILITOT 2.0*  PROT 7.0  ALBUMIN 4.1   Recent Labs  Lab 10/04/18 1745  LIPASE 38   No results for input(s): AMMONIA in the last 168 hours. ABG    Component Value Date/Time   PHART 7.410 12/13/2014 1816   PCO2ART 34.8 (L) 12/13/2014 1816   PO2ART 70.0 (L) 12/13/2014 1816   HCO3 21.9 12/13/2014 1816   TCO2 23 12/14/2014 1638   ACIDBASEDEF 2.0 12/13/2014 1816   O2SAT 94.0 12/13/2014 1816    Coagulation Profile: No results for input(s): INR, PROTIME in the last 168 hours. Cardiac Enzymes: No results for input(s): CKTOTAL, CKMB, CKMBINDEX, TROPONINI in the last 168 hours. HbA1C: Hgb A1c MFr Bld  Date/Time Value Ref Range Status  12/12/2014 03:30 PM 5.4 4.8 - 5.6 % Final    Comment:    (NOTE)         Pre-diabetes: 5.7 - 6.4         Diabetes: >6.4         Glycemic control for adults with diabetes: <7.0   12/07/2014 06:00 PM 5.3 4.8 - 5.6 % Final    Comment:    (NOTE)         Pre-diabetes: 5.7 - 6.4         Diabetes: >6.4         Glycemic control for adults with diabetes: <7.0    CBG: No results for input(s): GLUCAP  in the last 168 hours.  Review of Systems:   All negative; except for those that are bolded, which indicate positives.  Constitutional: weight loss, weight gain, night sweats, fevers, chills, fatigue, weakness.  HEENT: headaches, sore throat, sneezing, nasal congestion, post nasal drip, difficulty swallowing, tooth/dental problems, visual complaints, visual changes, ear aches. Neuro: difficulty with speech, weakness, numbness, ataxia. CV:  chest pain, orthopnea, PND, swelling in lower extremities, dizziness, palpitations, syncope.  Resp: cough, hemoptysis, dyspnea, wheezing. GI: heartburn,  indigestion, abdominal pain, nausea, vomiting, diarrhea, constipation, change in bowel habits, loss of appetite, hematemesis, melena, hematochezia.  GU: dysuria, change in color of urine, urgency or frequency, flank pain, hematuria. MSK: joint pain or swelling, decreased range of motion. Psych: change in mood or affect, depression, anxiety, suicidal ideations, homicidal ideations. Skin: rash, itching, bruising.   Past medical history  He,  has a past medical history of AAA (abdominal aortic aneurysm) ([place]), Anxiety, CHF (congestive heart failure) ([place]), COPD (chronic obstructive pulmonary disease) ([place]), Coronary artery disease, GERD (gastroesophageal reflux disease), History of blood transfusion, Hyperlipidemia, Hypertension, Hyponatremia, Lumbar degenerative disc disease, Myocardial infarction ([place]), PAD (peripheral artery disease) ([place]), PONV (postoperative nausea and vomiting), Raynaud's disease /phenomenon, and Stroke ([place]) (10/15/2005).   Surgical History    Past Surgical History:  Procedure Laterality Date  . ABDOMINAL ANGIOGRAM  03/30/2012  . ABDOMINAL AORTAGRAM N/A 03/30/2012   Procedure: ABDOMINAL Maxcine Ham;  Surgeon: Conrad [place], MD;  Location: [facility] CATH LAB;  Service: Cardiovascular;  Laterality: N/A;  . CARDIAC CATHETERIZATION  2006  . CARDIAC CATHETERIZATION N/A 12/09/2014   Procedure: Left  Heart Cath and Coronary Angiography;  Surgeon: [name], MD;  Location: [place] CV LAB;  Service: Cardiovascular;  Laterality: N/A;  . CAROTID ENDARTERECTOMY     bilateral  . CAROTID ENDARTERECTOMY  04/13/2005   Right and 06/03/2005 Left  . CORONARY ARTERY BYPASS GRAFT N/A 12/13/2014   Procedure: CORONARY ARTERY BYPASS GRAFT times five using left internal mammary artery and endoscopic right leg saphenous vein harvest;  Surgeon: Gaye Pollack, MD;  Location: [facility] OR;  Service: Open Heart Surgery;  Laterality: N/A;  . HERNIA REPAIR    . ILIAC ARTERY STENT  10/15/2005    Left common and external   . INTRAOPERATIVE TRANSESOPHAGEAL ECHOCARDIOGRAM N/A 12/13/2014   Procedure: INTRAOPERATIVE TRANSESOPHAGEAL ECHOCARDIOGRAM;  Surgeon: Gaye Pollack, MD;  Location: [facility] OR;  Service: Open Heart Surgery;  Laterality: N/A;     Social History   reports that he quit smoking about 13 years ago. His smoking use included cigarettes. He has never used smokeless tobacco. He reports current alcohol use of about 24.0 standard drinks of alcohol per week. He reports that he does not use drugs.   Family history   His family history includes Coronary artery disease (age of onset: 11) in his father; Dementia in his mother; Heart disease in his brother and father; Hyperlipidemia in his brother and father; Hypertension in his brother and father; Other (age of onset: 52) in his mother; Peripheral vascular disease in his father.   Allergies Allergies  Allergen Reactions  . Hctz [Hydrochlorothiazide] Other (See Comments)    Black out due to electrolytes  . Heparin Other (See Comments)    HIT positive as of 12/31/14  . Norvasc [Amlodipine Besylate] Swelling       . Pletal [Cilostazol] Diarrhea     Home meds  Prior to Admission medications   Medication Sig Start Date End Date Taking? Authorizing Provider  Ascorbic Acid (VITAMIN C) 100 MG tablet Take 100 mg by mouth daily.    [provider]  carvedilol  (COREG) 25 MG tablet TAKE 1 AND 1/2 TABLETS BY MOUTH EVERY MORNING AND TAKE 1 TABLET EVERY AFTERNOON 05/29/18   Burtis Junes, NP  clobetasol ointment (TEMOVATE) 0.05 % apply to affected area twice a day if needed 09/24/15   [provider]  cloNIDine (CATAPRES) 0.2 MG tablet TAKE 1 TABLET BY MOUTH TWICE DAILY 03/27/18   Servando Snare,  Marlane Hatcher, NP  ferrous sulfate 325 (65 FE) MG EC tablet Take 325 mg by mouth daily with breakfast.    [provider]  folic acid (FOLVITE) 1 MG tablet Take 1 tablet (1 mg total) by mouth daily. 01/29/15   Burtis Junes, NP  furosemide (LASIX) 40 MG tablet Take 40 mg by mouth daily.    [provider]  lisinopril (PRINIVIL,ZESTRIL) 20 MG tablet TAKE 1 TABLET BY MOUTH ONCE DAILY 04/03/18   Burtis Junes, NP  nitroGLYCERIN (NITROSTAT) 0.4 MG SL tablet Place 0.4 mg under the tongue every 5 (five) minutes as needed for chest pain.    [provider]  ranitidine (ZANTAC) 300 MG tablet TAKE 1 TABLET BY MOUTH AT BEDTIME 06/26/18   [name], MD  rosuvastatin (CRESTOR) 20 MG tablet TAKE 1 TABLET BY MOUTH AT BEDTIME 08/01/18   Burtis Junes, NP  Thiamine HCl (VITAMIN B-1) 100 MG tablet Take 100 mg by mouth daily.      [provider]  traMADol (ULTRAM) 50 MG tablet Take 1 tablet (50 mg total) by mouth every 6 (six) hours as needed. 01/13/15   Burtis Junes, NP  triamcinolone ointment (KENALOG) 0.1 % Apply 1 application topically 2 (two) times daily. 08/05/15   Bobbitt, Sedalia Muta, MD    Critical care time: 45 min.    Montey Hora, [facility] Pager: [phone].  If no answer, [phone] 10/04/2018, 9:39 PM

## 2018-10-04 NOTE — ED Provider Notes (Signed)
[facility] EMERGENCY DEPARTMENT Provider Note   CSN: [id number] Arrival date & time: 10/04/18  1701    History   Chief Complaint Chief Complaint  Patient presents with   Hernia   Abdominal Pain    HPI [name] is a 68 y.o. male.     HPI  68 year old male presents with severe abdominal pain.  Started this morning.  It is at the site of his umbilical hernia.  He states this hernia has been there for a long time though he is not a good surgical candidate due to a AAA and multiple other comorbidities.  The AAA has also not been operated on.  He chronically has this hernia that is out, but is never hurt like this before.  He feels nauseated though no vomiting.  Pain is severe.  Past Medical History:  Diagnosis Date   AAA (abdominal aortic aneurysm) (HCC)    Anxiety    CHF (congestive heart failure) (HCC)    COPD (chronic obstructive pulmonary disease) (HCC)    Coronary artery disease    GERD (gastroesophageal reflux disease)    History of blood transfusion    Hyperlipidemia    Hypertension    takes meds daily   Hyponatremia    Lumbar degenerative disc disease    Myocardial infarction ([place])    PAD (peripheral artery disease) (HCC)    PONV (postoperative nausea and vomiting)    Raynaud's disease /phenomenon    Stroke ([place]) 10/15/2005    Patient Active Problem List   Diagnosis Date Noted   Umbilical hernia without obstruction and without gangrene    Epistaxis 02/25/2017   Hypertensive urgency 02/25/2017   Urticaria with associated angioedema 08/05/2015   Angioedema 07/22/2015   Encounter for therapeutic drug monitoring 01/08/2015   Acute pulmonary embolism ([place]) 01/03/2015   Acute DVT (deep venous thrombosis) ([place]) 12/27/2014   S/P CABG x 5 12/13/14 12/27/2014   Anemia 12/27/2014   Acute renal failure ([place]) 12/27/2014   AKI (acute kidney injury) ([place])    Absolute anemia    CAD (coronary artery disease)  12/13/2014   NSTEMI (non-ST elevated myocardial infarction) ([place]) 12/09/2014   Hyponatremia    Unstable angina pectoris ([place]) 12/07/2014   Thoracic aneurysm without mention of rupture 04/14/2012   Intermittent claudication ([place]) 03/10/2012   Atherosclerosis of native arteries of extremity with intermittent claudication ([place]) 10/08/2011   Abdominal aortic aneurysm (AAA) greater than 5.5 cm in diameter in male ([place]) 09/07/2011   Carotid stenosis, bilateral 09/07/2011   Atherosclerosis of native coronary artery with unstable angina pectoris ([place]) 01/03/2011   Essential hypertension, benign 01/03/2011   Hyperlipidemia with target LDL less than 70 01/03/2011   PAD (peripheral artery disease) ([place]) 01/03/2011    Past Surgical History:  Procedure Laterality Date   ABDOMINAL ANGIOGRAM  03/30/2012   ABDOMINAL AORTAGRAM N/A 03/30/2012   Procedure: ABDOMINAL Maxcine Ham;  Surgeon: Conrad Brownsdale, MD;  Location: [facility] CATH LAB;  Service: Cardiovascular;  Laterality: N/A;   CARDIAC CATHETERIZATION  2006   CARDIAC CATHETERIZATION N/A 12/09/2014   Procedure: Left Heart Cath and Coronary Angiography;  Surgeon: [name], MD;  Location: [place] CV LAB;  Service: Cardiovascular;  Laterality: N/A;   CAROTID ENDARTERECTOMY     bilateral   CAROTID ENDARTERECTOMY  04/13/2005   Right and 06/03/2005 Left   CORONARY ARTERY BYPASS GRAFT N/A 12/13/2014   Procedure: CORONARY ARTERY BYPASS GRAFT times five using left internal mammary artery and endoscopic right leg saphenous vein  harvest;  Surgeon: Gaye Pollack, MD;  Location: [place];  Service: Open Heart Surgery;  Laterality: N/A;   HERNIA REPAIR     ILIAC ARTERY STENT  10/15/2005    Left common and external    INTRAOPERATIVE TRANSESOPHAGEAL ECHOCARDIOGRAM N/A 12/13/2014   Procedure: INTRAOPERATIVE TRANSESOPHAGEAL ECHOCARDIOGRAM;  Surgeon: Gaye Pollack, MD;  Location: [facility] OR;  Service: Open Heart Surgery;  Laterality: N/A;        Home  Medications    Prior to Admission medications   Medication Sig Start Date End Date Taking? Authorizing Provider  Ascorbic Acid (VITAMIN C) 100 MG tablet Take 100 mg by mouth daily.   Yes [provider]  carvedilol (COREG) 25 MG tablet TAKE 1 AND 1/2 TABLETS BY MOUTH EVERY MORNING AND TAKE 1 TABLET EVERY AFTERNOON Patient taking differently: Take 25-37.5 mg by mouth See admin instructions. Take 37.5 mg by mouth in the morning and 25 mg in the afternoon 05/29/18  Yes Burtis Junes, NP  cetirizine (ZYRTEC) 10 MG tablet Take 10 mg by mouth daily.   Yes [provider]  clobetasol ointment (TEMOVATE) 9.62 % Apply 1 application topically See admin instructions. Apply to both legs every other day 09/24/15  Yes [provider]  cloNIDine (CATAPRES) 0.2 MG tablet TAKE 1 TABLET BY MOUTH TWICE DAILY Patient taking differently: Take 0.2 mg by mouth 2 (two) times daily.  03/27/18  Yes Burtis Junes, NP  Famotidine (PEPCID AC PO) Take 1 tablet by mouth at bedtime.   Yes [provider]  folic acid (FOLVITE) 1 MG tablet Take 1 tablet (1 mg total) by mouth daily. Patient taking differently: Take 1 mg by mouth at bedtime.  01/29/15  Yes Burtis Junes, NP  furosemide (LASIX) 40 MG tablet Take 40 mg by mouth daily.   Yes [provider]  lisinopril (PRINIVIL,ZESTRIL) 20 MG tablet TAKE 1 TABLET BY MOUTH ONCE DAILY Patient taking differently: Take 20 mg by mouth daily.  04/03/18  Yes Burtis Junes, NP  nitroGLYCERIN (NITROSTAT) 0.4 MG SL tablet Place 0.4 mg under the tongue every 5 (five) minutes as needed for chest pain.   Yes [provider]  rosuvastatin (CRESTOR) 20 MG tablet TAKE 1 TABLET BY MOUTH AT BEDTIME Patient taking differently: Take 20 mg by mouth at bedtime.  08/01/18  Yes Burtis Junes, NP  Thiamine HCl (VITAMIN B-1) 100 MG tablet Take 100 mg by mouth daily.     Yes [provider]  traMADol (ULTRAM) 50 MG tablet Take 1 tablet (50  mg total) by mouth every 6 (six) hours as needed. Patient taking differently: Take 50 mg by mouth every 6 (six) hours as needed (for pain).  01/13/15  Yes Burtis Junes, NP  triamcinolone ointment (KENALOG) 0.1 % Apply 1 application topically 2 (two) times daily. Patient taking differently: Apply 1 application topically See admin instructions. Apply to both legs every other day 08/05/15  Yes Bobbitt, Sedalia Muta, MD    Family History Family History  Problem Relation Age of Onset   Coronary artery disease Father 51       HTN, Hyperlipidemia died   Heart disease Father        Heart Disease before age 2   Hypertension Father    Hyperlipidemia Father    Peripheral vascular disease Father    Other Mother 45        died rheumatoid arthritis   Dementia Mother    Heart disease  Brother    Hypertension Brother    Hyperlipidemia Brother     Social History Social History   Tobacco Use   Smoking status: Former Smoker    Types: Cigarettes    Last attempt to quit: 05/24/2005    Years since quitting: 13.3   Smokeless tobacco: Never Used  Substance Use Topics   Alcohol use: Yes    Alcohol/week: 24.0 standard drinks    Types: 24 Cans of beer per week    Comment: approximately 24 beer a week   Drug use: No     Allergies   Hctz [hydrochlorothiazide]; Heparin; Norvasc [amlodipine besylate]; and Pletal [cilostazol]   Review of Systems Review of Systems  Constitutional: Negative for fever.  Respiratory: Negative for shortness of breath.   Gastrointestinal: Positive for abdominal distention, abdominal pain and nausea. Negative for vomiting.  All other systems reviewed and are negative.    Physical Exam Updated Vital Signs BP 120/75    Pulse 68    Temp (!) 97.2 F (36.2 C) (Oral)    Resp 10    Ht 5\' 10"  (1.778 m)    Wt 90.7 kg    SpO2 97%    BMI 28.70 kg/m   Physical Exam Vitals signs and nursing note reviewed.  Constitutional:      General: He is in acute  distress (in severe pain).     Appearance: He is well-developed. He is not diaphoretic.  HENT:     Head: Normocephalic and atraumatic.     Right Ear: External ear normal.     Left Ear: External ear normal.     Nose: Nose normal.  Eyes:     General:        Right eye: No discharge.        Left eye: No discharge.  Neck:     Musculoskeletal: Neck supple.  Cardiovascular:     Rate and Rhythm: Normal rate and regular rhythm.     Heart sounds: Normal heart sounds.  Pulmonary:     Effort: Pulmonary effort is normal.     Breath sounds: Normal breath sounds.  Abdominal:     Palpations: Abdomen is soft.     Tenderness: There is generalized abdominal tenderness.     Hernia: A hernia is present. Hernia is present in the umbilical area.     Comments: Mild generalized abdominal tenderness but it is worst over his umbilicus, which has a non-reducible  hernia.  Skin:    General: Skin is warm and dry.  Neurological:     Mental Status: He is alert.  Psychiatric:        Mood and Affect: Mood is not anxious.      ED Treatments / Results  Labs (all labs ordered are listed, but only abnormal results are displayed) Labs Reviewed  COMPREHENSIVE METABOLIC PANEL - Abnormal; Notable for the following components:      Result Value   Sodium 134 (*)    Chloride 97 (*)    Glucose, Bld 132 (*)    BUN 45 (*)    Creatinine, Ser 2.11 (*)    Total Bilirubin 2.0 (*)    GFR calc non Af Amer 31 (*)    GFR calc Af Amer 36 (*)    All other components within normal limits  CBC - Abnormal; Notable for the following components:   WBC 17.9 (*)    RBC 3.11 (*)    Hemoglobin 10.6 (*)    HCT 30.4 (*)  MCH 34.1 (*)    All other components within normal limits  URINALYSIS, ROUTINE W REFLEX MICROSCOPIC - Abnormal; Notable for the following components:   Hgb urine dipstick MODERATE (*)    Protein, ur 100 (*)    All other components within normal limits  GLUCOSE, CAPILLARY - Abnormal; Notable for the  following components:   Glucose-Capillary 107 (*)    All other components within normal limits  I-STAT CREATININE, ED - Abnormal; Notable for the following components:   Creatinine, Ser 2.00 (*)    All other components within normal limits  SARS CORONAVIRUS 2 (HOSPITAL ORDER, [place] [facility])  LIPASE, BLOOD  LACTIC ACID, PLASMA  URINALYSIS, MICROSCOPIC (REFLEX)  LACTIC ACID, PLASMA  CBC  BASIC METABOLIC PANEL  MAGNESIUM  PHOSPHORUS  TYPE AND SCREEN    EKG EKG Interpretation  Date/Time:  Wednesday Oct 04 2018 17:37:12 EDT Ventricular Rate:  67 PR Interval:    QRS Duration: 105 QT Interval:  421 QTC Calculation: 445 R Axis:   79 Text Interpretation:  Sinus rhythm Left atrial enlargement Inferior infarct, age indeterminate Confirmed by Sherwood Gambler [id number]) on 10/04/2018 5:49:18 PM   Radiology Ct Abdomen Pelvis Wo Contrast  Result Date: 10/04/2018 CLINICAL DATA:  Hernia. EXAM: CT ABDOMEN AND PELVIS WITHOUT CONTRAST TECHNIQUE: Multidetector CT imaging of the abdomen and pelvis was performed following the standard protocol without IV contrast. COMPARISON:  CT abdomen pelvis dated April 14, 2012. FINDINGS: Lower chest: No acute abnormality. Hepatobiliary: No focal liver abnormality. Tiny gallstone. No gallbladder wall thickening or biliary dilatation. Pancreas: Unremarkable. No pancreatic ductal dilatation or surrounding inflammatory changes. Spleen: Normal in size without focal abnormality. Adrenals/Urinary Tract: The adrenal glands are unremarkable. Bilateral renal atrophy with perinephric fat stranding. 1.6 cm right renal simple cyst. No renal calculi or hydronephrosis. The bladder is unremarkable for the degree of distention. Stomach/Bowel: Stomach is within normal limits. Appendix appears normal. No evidence of bowel wall thickening, distention, or inflammatory changes. Vascular/Lymphatic: Interval enlargement of the infrarenal abdominal aortic aneurysm, now  measuring 8.0 cm, previously 4.9 cm. Aortoiliac atherosclerosis. No enlarged abdominal or pelvic lymph nodes. Reproductive: Prostate is unremarkable. Other: Moderate fat containing umbilical hernia. Small fat containing right inguinal hernia. No free fluid or pneumoperitoneum. Musculoskeletal: No acute or significant osseous findings. IMPRESSION: 1. Moderate fat containing umbilical hernia. Small fat containing right inguinal hernia. No inflammatory changes within the hernias. 2. 8.0 cm infrarenal abdominal aortic aneurysm. Vascular surgery consultation recommended due to increased risk of rupture for AAA >5.5 cm. This recommendation follows ACR consensus guidelines: White Paper of the ACR Incidental Findings Committee II on Vascular Findings. J Am Coll Radiol 2013; 10:789-794. Aortic aneurysm NOS (ICD10-I71.9) 3.  Aortic atherosclerosis (ICD10-I70.0). 4. Cholelithiasis. Electronically Signed   By: Titus Dubin M.D.   On: 10/04/2018 18:40    Procedures .Critical Care Performed by: Sherwood Gambler, MD Authorized by: Sherwood Gambler, MD   Critical care provider statement:    Critical care time (minutes):  45   Critical care time was exclusive of:  Separately billable procedures and treating other patients   Critical care was necessary to treat or prevent imminent or life-threatening deterioration of the following conditions:  Circulatory failure   Critical care was time spent personally by me on the following activities:  Development of treatment plan with patient or surrogate, discussions with consultants, evaluation of patient's response to treatment, obtaining history from patient or surrogate, examination of patient, ordering and performing treatments and interventions, ordering and  review of laboratory studies, ordering and review of radiographic studies, re-evaluation of patient's condition, review of old charts and pulse oximetry   (including critical care time)  Medications Ordered in  ED Medications  sodium chloride flush (NS) 0.9 % injection 3 mL (3 mLs Intravenous Not Given 10/04/18 1939)  clevidipine (CLEVIPREX) infusion 0.5 mg/mL (8 mg/hr Intravenous Rate/Dose Change 10/04/18 2021)  fentaNYL (SUBLIMAZE) injection 25-50 mcg (has no administration in time range)  cloNIDine (CATAPRES) tablet 0.2 mg (has no administration in time range)  folic acid (FOLVITE) tablet 1 mg (has no administration in time range)  furosemide (LASIX) tablet 40 mg (has no administration in time range)  nitroGLYCERIN (NITROSTAT) SL tablet 0.4 mg (has no administration in time range)  famotidine (PEPCID) tablet 20 mg (has no administration in time range)  rosuvastatin (CRESTOR) tablet 20 mg (has no administration in time range)  thiamine (VITAMIN B-1) tablet 100 mg (has no administration in time range)  carvedilol (COREG) tablet 37.5 mg (has no administration in time range)  carvedilol (COREG) tablet 25 mg (has no administration in time range)  ferrous sulfate tablet 325 mg (has no administration in time range)  ondansetron (ZOFRAN) injection 4 mg (4 mg Intravenous Given 10/04/18 1753)  HYDROmorphone (DILAUDID) injection 1 mg (1 mg Intravenous Given 10/04/18 1753)  sodium chloride 0.9 % bolus 1,000 mL (0 mLs Intravenous Stopped 10/04/18 2020)  HYDROmorphone (DILAUDID) injection 1 mg (1 mg Intravenous Given 10/04/18 1925)  hydrALAZINE (APRESOLINE) injection 10 mg (10 mg Intravenous Given 10/04/18 1934)  HYDROmorphone (DILAUDID) injection 1 mg (1 mg Intravenous Given 10/04/18 2108)     Initial Impression / Assessment and Plan / ED Course  I have reviewed the triage vital signs and the nursing notes.  Pertinent labs & imaging results that were available during my care of the patient were reviewed by me and considered in my medical decision making (see chart for details).        Patient's pain appears to be from the hernia though he is also tender diffusely.  I cannot fully reduce the hernia though  general surgery was able to further reduce it and he still having significant pain.  I have discussed with Dr. Trula Slade, who is examined patient and reviewed CTs.  We feel this aneurysm could be causing the pain though does not appear to have ruptured.  However after long discussion with vascular, the patient does not want to pursue surgery as he has indicated throughout years of following with vascular surgery at [facility].  He would like to be DNR.  Vascular advises tight blood pressure control to systolic of 794 or less.  He initially has blood pressure over 200 and this is come down with Cleviprex.  His pain is better.  ICU will admit given the need for continuous blood pressure control.  Final Clinical Impressions(s) / ED Diagnoses   Final diagnoses:  AAA (abdominal aortic aneurysm) without rupture [facility])  Hypertensive emergency    ED Discharge Orders    None       Sherwood Gambler, MD 10/04/18 2303

## 2018-10-04 NOTE — ED Notes (Signed)
Wife updated on patients condition per Dr. Regenia Skeeter EDP

## 2018-10-04 NOTE — ED Triage Notes (Addendum)
Pt in c/o belly button pain, area is swollen and tender to touch, denies radiation into his back or any other areas, states he has a hernia that they have not been able to operate on due to its proximity to his AAA, no history of pain like this in the past, pt very uncomfortable, appears pale, hypertensive in triage

## 2018-10-04 NOTE — ED Notes (Signed)
Vascular surgeon at bedside.

## 2018-10-05 DIAGNOSIS — I161 Hypertensive emergency: Principal | ICD-10-CM

## 2018-10-05 LAB — BASIC METABOLIC PANEL
Anion gap: 14 (ref 5–15)
BUN: 45 mg/dL — ABNORMAL HIGH (ref 8–23)
CO2: 23 mmol/L (ref 22–32)
Calcium: 9 mg/dL (ref 8.9–10.3)
Chloride: 99 mmol/L (ref 98–111)
Creatinine, Ser: 2.18 mg/dL — ABNORMAL HIGH (ref 0.61–1.24)
GFR calc Af Amer: 34 mL/min — ABNORMAL LOW (ref >60)
GFR calc non Af Amer: 30 mL/min — ABNORMAL LOW (ref >60)
Glucose, Bld: 121 mg/dL — ABNORMAL HIGH (ref 70–99)
Potassium: 4.1 mmol/L (ref 3.5–5.1)
Sodium: 136 mmol/L (ref 135–145)

## 2018-10-05 LAB — CBC
HCT: 29.6 % — ABNORMAL LOW (ref 39.0–52.0)
Hemoglobin: 10.1 g/dL — ABNORMAL LOW (ref 13.0–17.0)
MCH: 33.3 pg (ref 26.0–34.0)
MCHC: 34.1 g/dL (ref 30.0–36.0)
MCV: 97.7 fL (ref 80.0–100.0)
Platelets: 138 10*3/uL — ABNORMAL LOW (ref 150–400)
RBC: 3.03 MIL/uL — ABNORMAL LOW (ref 4.22–5.81)
RDW: 13.5 % (ref 11.5–15.5)
WBC: 15 10*3/uL — ABNORMAL HIGH (ref 4.0–10.5)
nRBC: 0 % (ref 0.0–0.2)

## 2018-10-05 LAB — MAGNESIUM: Magnesium: 1.7 mg/dL (ref 1.7–2.4)

## 2018-10-05 LAB — PHOSPHORUS: Phosphorus: 3.8 mg/dL (ref 2.5–4.6)

## 2018-10-05 LAB — MRSA PCR SCREENING: MRSA by PCR: NEGATIVE

## 2018-10-05 MED ORDER — MAGNESIUM SULFATE 2 GM/50ML IV SOLN
2.0000 g | Freq: Once | INTRAVENOUS | Status: AC
  Administered 2018-10-05: 2 g via INTRAVENOUS
  Filled 2018-10-05: qty 50

## 2018-10-05 MED ORDER — LORATADINE 10 MG PO TABS
10.0000 mg | ORAL_TABLET | Freq: Every day | ORAL | Status: DC
  Administered 2018-10-05 – 2018-10-08 (×4): 10 mg via ORAL
  Filled 2018-10-05 (×4): qty 1

## 2018-10-05 MED ORDER — TRAMADOL HCL 50 MG PO TABS
50.0000 mg | ORAL_TABLET | Freq: Four times a day (QID) | ORAL | Status: DC | PRN
  Administered 2018-10-05 – 2018-10-06 (×2): 50 mg via ORAL
  Filled 2018-10-05 (×2): qty 1

## 2018-10-05 MED ORDER — FAMOTIDINE 20 MG PO TABS
10.0000 mg | ORAL_TABLET | Freq: Every day | ORAL | Status: DC
  Administered 2018-10-06 – 2018-10-07 (×2): 10 mg via ORAL
  Filled 2018-10-05 (×2): qty 1

## 2018-10-05 MED ORDER — ACETAMINOPHEN 325 MG PO TABS
650.0000 mg | ORAL_TABLET | Freq: Four times a day (QID) | ORAL | Status: DC | PRN
  Administered 2018-10-06 – 2018-10-07 (×2): 650 mg via ORAL
  Filled 2018-10-05 (×2): qty 2

## 2018-10-05 NOTE — Progress Notes (Signed)
  Progress Note    10/05/2018 10:12 AM * No surgery found *  Subjective: Still having abdominal pain  Vitals:   10/05/18 0900 10/05/18 0915  BP: 124/61 136/62  Pulse: 69 66  Resp: 14 14  Temp:    SpO2: 98% 97%    Physical Exam: Awake alert and oriented Palpable left brachial and radial pulses, no palpable femoral pulses Abdomen is soft although diffusely tender.  There is a large umbilical hernia  CBC    Component Value Date/Time   WBC 15.0 (H) 10/05/2018 0531   RBC 3.03 (L) 10/05/2018 0531   HGB 10.1 (L) 10/05/2018 0531   HGB 9.4 (L) 06/05/2018 0907   HCT 29.6 (L) 10/05/2018 0531   HCT 26.4 (L) 06/05/2018 0907   PLT 138 (L) 10/05/2018 0531   PLT 115 (L) 06/05/2018 0907   MCV 97.7 10/05/2018 0531   MCV 100 (H) 06/05/2018 0907   MCH 33.3 10/05/2018 0531   MCHC 34.1 10/05/2018 0531   RDW 13.5 10/05/2018 0531   RDW 12.2 06/05/2018 0907   LYMPHSABS 1.0 02/01/2018 1254   MONOABS 1.0 (H) 02/01/2018 1254   EOSABS 0.5 02/01/2018 1254   BASOSABS 0.1 02/01/2018 1254    BMET    Component Value Date/Time   NA 136 10/05/2018 0531   NA 131 (L) 06/05/2018 0907   K 4.1 10/05/2018 0531   CL 99 10/05/2018 0531   CO2 23 10/05/2018 0531   GLUCOSE 121 (H) 10/05/2018 0531   BUN 45 (H) 10/05/2018 0531   BUN 31 (H) 06/05/2018 0907   CREATININE 2.18 (H) 10/05/2018 0531   CREATININE 0.97 10/14/2015 1233   CALCIUM 9.0 10/05/2018 0531   GFRNONAA 30 (L) 10/05/2018 0531   GFRAA 34 (L) 10/05/2018 0531    INR    Component Value Date/Time   INR 0.98 02/25/2017 1352     Intake/Output Summary (Last 24 hours) at 10/05/2018 1012 Last data filed at 10/05/2018 0800 Gross per 24 hour  Intake 1269.38 ml  Output 200 ml  Net 1069.38 ml     Assessment/plan:  68 y.o. male is here with large abdominal aortic aneurysm measuring approximately 8 cm.  Patient would need aortobifemoral bypass and would likely require dialysis.  He is elected to not have any surgery.  I am unsure if this is  the cause of his abdominal pain at this time or not as it certainly does not appear ruptured by CT scan and his hematocrit is stable to suggest that he is not bleeding.  He remains on Cleviprex for hypertensive urgency and critical care involvement is appreciated.  When blood pressure is controlled on p.o. meds will be able to be transferred to the floor and likely home when pain is controlled.  Patient demonstrates good understanding of outcome of large unfixed aneurysm.    Ammon Muscatello C. Donzetta Matters, MD [facility] Office: [phone] Pager: [phone]  10/05/2018 10:12 AM

## 2018-10-05 NOTE — Progress Notes (Signed)
NAME:  [name], MRN:  [id number], DOB:  Sep 03, 1950, LOS: 1 ADMISSION DATE:  10/04/2018, CONSULTATION DATE:  10/04/18 REFERRING MD:  Regenia Skeeter  CHIEF COMPLAINT:  Abd pain   Brief History   [name] is a 68 y.o. male who was admitted 5/13 with hypertensive emergency with presenting BP of 255/113.  He was started on cleviprex and admitted to the ICU.  History of present illness   [name] is a 68 y.o. male who has a PMH as outlined below including but not limited to CAD with MI s/p CABG, infrarenal AAA (followed at [facility]) abdominal hernias not amenable to surgery,  HTN, HLD, CHF, COPD (see "past medical history" for rest).  He presented to [facility] ED 5/13 with severe abdominal pain that began earlier that day.  Pain located near his umbilical hernia which is chronic; however, it has not been amenable to surgery due to close proximity to known infrarenal AAA (was initially scheduled for surgery in 2013 but was then deferred to concerns over aortic neck and he was referred to [facility] where he was last seen in 2018).  Pt apparently refused surgery roughly 7-8 years ago despite being told that aneurysm would eventually rupture.  In ED, he was found to have hypertensive emergency with presenting BP of 255/113.  He was started on cleviprex.  CT of the abdomen demonstrated 8cm infrarenal AAA.  Vascular surgery was consulted and medical management was recommended.   He had a prednisone shot in his back a few days ago (gets them every few weeks).  Otherwise, no new meds or events.  Per wife, pt had missed his doses of clonidine over the past 3 days.  Also missed PM dose of carvedilol for the past 3 days.  Pt has advanced directives which include full DNR.  This was discussed with pt and wife who both agree.  If BP worsens or fails to improve with IV meds, then will transition to comfort care (though currently has already improved on cleviprex).  Past Medical History  CAD with MI s/p CABG,  infrarenal AAA (followed at [facility]) abdominal hernias not amenable to surgery,  HTN, HLD, CHF, COPD, anxiety, DDD, CVA.  Significant Hospital Events   5/13 > admit.  Consults:  Vascular surgery.  Procedures:  None.  Significant Diagnostic Tests:  CT A / P 5/13 > 8cm infrarenal AAA.  Micro Data:  None.  Antimicrobials:  None.   Interim history/subjective:  Blood pressures continue to decrease weaning Cleviprex at this time.  Objective:  Blood pressure 136/62, pulse 66, temperature 98.5 F (36.9 C), temperature source Oral, resp. rate 14, height 5\' 10"  (1.778 m), weight 88 kg, SpO2 97 %.        Intake/Output Summary (Last 24 hours) at 10/05/2018 0950 Last data filed at 10/05/2018 0800 Gross per 24 hour  Intake 1269.38 ml  Output 200 ml  Net 1069.38 ml   Filed Weights   10/04/18 1750 10/04/18 2300  Weight: 90.7 kg 88 kg    Examination: General: Obese male no acute distress HEENT: No JVD or lymphadenopathy is appreciated Neuro: Grossly intact CV: s1s2 rrr, no m/r/g PULM: Decreased breath sounds at bases GI: Abdomen is soft but extremely tender to touch, cannot palpate femoral arteries due to his pain level being high with attempted pulse check.  Umbilical hernia is large noted Extremities: warm/dry, 1+ edema  Skin: no rashes or lesions   Assessment & Plan:   Hypertensive emergency (presenting BP  255/113). -Continue Cleviprex.  Change systolic blood pressure goal to 150 less than.. -Wean Cleviprex off once p.o. medications are on board -We will resume preadmission Coreg, clonidine, and Lasix -Hold ACE inhibitor due to acute kidney injury.   Hx CHF, HLD. -Continue Lasix and statin   AKI - presumed due to hypertensive emergency + ACEi. Lab Results  Component Value Date   CREATININE 2.18 (H) 10/05/2018   CREATININE 2.00 (H) 10/04/2018   CREATININE 2.11 (H) 10/04/2018   CREATININE 0.97 10/14/2015   CREATININE 0.92 06/11/2015   CREATININE 0.81 04/15/2015     -Hold ACE inhibitor -Trend creatinine  Chronic abdominal hernia. -No interventions are planned at this time   Best Practice:  Diet: Heart healthy. Pain/Anxiety/Delirium protocol (if indicated): Fentanyl PRN for pain. VAP protocol (if indicated): None. DVT prophylaxis: SCD's. GI prophylaxis: None. Glucose control: None. Mobility: Bedrest. Code Status: DNR. Family Communication: 10/05/2018 patient updated at bedside Disposition: ICU while on cleviprex.  Labs   CBC: Recent Labs  Lab 10/04/18 1745 10/05/18 0531  WBC 17.9* 15.0*  HGB 10.6* 10.1*  HCT 30.4* 29.6*  MCV 97.7 97.7  PLT 165 542*   Basic Metabolic Panel: Recent Labs  Lab 10/04/18 1745 10/04/18 1750 10/05/18 0531  NA 134*  --  136  K 3.8  --  4.1  CL 97*  --  99  CO2 22  --  23  GLUCOSE 132*  --  121*  BUN 45*  --  45*  CREATININE 2.11* 2.00* 2.18*  CALCIUM 9.6  --  9.0  MG  --   --  1.7  PHOS  --   --  3.8   GFR: Estimated Creatinine Clearance: 35.2 mL/min (A) (by C-G formula based on SCr of 2.18 mg/dL (H)). Recent Labs  Lab 10/04/18 1745 10/04/18 2320 10/05/18 0531  WBC 17.9*  --  15.0*  LATICACIDVEN 1.1 1.8  --    Liver Function Tests: Recent Labs  Lab 10/04/18 1745  AST 19  ALT 15  ALKPHOS 57  BILITOT 2.0*  PROT 7.0  ALBUMIN 4.1   Recent Labs  Lab 10/04/18 1745  LIPASE 38   No results for input(s): AMMONIA in the last 168 hours. ABG    Component Value Date/Time   PHART 7.410 12/13/2014 1816   PCO2ART 34.8 (L) 12/13/2014 1816   PO2ART 70.0 (L) 12/13/2014 1816   HCO3 21.9 12/13/2014 1816   TCO2 23 12/14/2014 1638   ACIDBASEDEF 2.0 12/13/2014 1816   O2SAT 94.0 12/13/2014 1816    Coagulation Profile: No results for input(s): INR, PROTIME in the last 168 hours. Cardiac Enzymes: No results for input(s): CKTOTAL, CKMB, CKMBINDEX, TROPONINI in the last 168 hours. HbA1C: Hgb A1c MFr Bld  Date/Time Value Ref Range Status  12/12/2014 03:30 PM 5.4 4.8 - 5.6 % Final     Comment:    (NOTE)         Pre-diabetes: 5.7 - 6.4         Diabetes: >6.4         Glycemic control for adults with diabetes: <7.0   12/07/2014 06:00 PM 5.3 4.8 - 5.6 % Final    Comment:    (NOTE)         Pre-diabetes: 5.7 - 6.4         Diabetes: >6.4         Glycemic control for adults with diabetes: <7.0    CBG: Recent Labs  Lab 10/04/18 2238  GLUCAP 107*  Critical care time: 30 min.    Richardson Landry Beryle Bagsby ACNP [name] PCCM Pager [phone] till 1 pm If no answer page [phone] 10/05/2018, 9:50 AM

## 2018-10-06 ENCOUNTER — Telehealth: Payer: Self-pay | Admitting: Nurse Practitioner

## 2018-10-06 DIAGNOSIS — E7849 Other hyperlipidemia: Secondary | ICD-10-CM

## 2018-10-06 DIAGNOSIS — N183 Chronic kidney disease, stage 3 (moderate): Secondary | ICD-10-CM

## 2018-10-06 LAB — BASIC METABOLIC PANEL WITH GFR
Anion gap: 10 (ref 5–15)
BUN: 46 mg/dL — ABNORMAL HIGH (ref 8–23)
CO2: 23 mmol/L (ref 22–32)
Calcium: 8.5 mg/dL — ABNORMAL LOW (ref 8.9–10.3)
Chloride: 99 mmol/L (ref 98–111)
Creatinine, Ser: 2.19 mg/dL — ABNORMAL HIGH (ref 0.61–1.24)
GFR calc Af Amer: 34 mL/min — ABNORMAL LOW (ref >60)
GFR calc non Af Amer: 29 mL/min — ABNORMAL LOW (ref >60)
Glucose, Bld: 119 mg/dL — ABNORMAL HIGH (ref 70–99)
Potassium: 3.6 mmol/L (ref 3.5–5.1)
Sodium: 132 mmol/L — ABNORMAL LOW (ref 135–145)

## 2018-10-06 LAB — PHOSPHORUS: Phosphorus: 3.8 mg/dL (ref 2.5–4.6)

## 2018-10-06 LAB — MAGNESIUM: Magnesium: 2 mg/dL (ref 1.7–2.4)

## 2018-10-06 MED ORDER — HYDROCODONE-ACETAMINOPHEN 7.5-325 MG PO TABS
1.0000 | ORAL_TABLET | Freq: Four times a day (QID) | ORAL | Status: DC | PRN
  Administered 2018-10-06 – 2018-10-08 (×6): 1 via ORAL
  Filled 2018-10-06 (×6): qty 1

## 2018-10-06 MED ORDER — HYDRALAZINE HCL 25 MG PO TABS
25.0000 mg | ORAL_TABLET | Freq: Three times a day (TID) | ORAL | Status: DC
  Administered 2018-10-06 – 2018-10-07 (×3): 25 mg via ORAL
  Filled 2018-10-06 (×4): qty 1

## 2018-10-06 MED ORDER — HYDRALAZINE HCL 10 MG PO TABS
10.0000 mg | ORAL_TABLET | Freq: Three times a day (TID) | ORAL | Status: DC
  Administered 2018-10-06: 10 mg via ORAL
  Filled 2018-10-06 (×4): qty 1

## 2018-10-06 MED ORDER — SODIUM CHLORIDE 0.9 % IV SOLN
INTRAVENOUS | Status: DC | PRN
  Administered 2018-10-06: 11:00:00 via INTRAVENOUS

## 2018-10-06 MED ORDER — HYDRALAZINE HCL 25 MG PO TABS
25.0000 mg | ORAL_TABLET | Freq: Three times a day (TID) | ORAL | Status: DC
  Filled 2018-10-06: qty 1

## 2018-10-06 NOTE — Telephone Encounter (Signed)
Will forward to Truitt Merle NP so she is aware. Informed Patient's wife (DPR) that she will need to talk with the hospitalist for a consult for cardiologist. Patient's wife verbalized understanding.

## 2018-10-06 NOTE — Telephone Encounter (Signed)
Please let her know that I have looked at his chart.  Agree with current plan of care and we will be available as needed.  Routing to Dr. Burt Knack just as Mickle Asper

## 2018-10-06 NOTE — Telephone Encounter (Signed)
Called patient with Truitt Merle NP's message. Patient verbalized understanding.

## 2018-10-06 NOTE — Progress Notes (Addendum)
  Progress Note    10/06/2018 8:22 AM  Subjective:  No change in abd discomfort   Vitals:   10/06/18 0745 10/06/18 0800  BP: (!) 143/76 (!) 157/77  Pulse: 71 64  Resp: 13 15  Temp:    SpO2: 97% 97%   Physical Exam: Lungs:  Non labored on oxygen by [place] Extremities:  L DP palpable; unable to palpate R pedal pulses however foot warm to touch with good cap refill Abdomen:  Umbilical hernia without pain, did not attempt to reduce; generalized tenderness all quadrants Neurologic: A&O  CBC    Component Value Date/Time   WBC 15.0 (H) 10/05/2018 0531   RBC 3.03 (L) 10/05/2018 0531   HGB 10.1 (L) 10/05/2018 0531   HGB 9.4 (L) 06/05/2018 0907   HCT 29.6 (L) 10/05/2018 0531   HCT 26.4 (L) 06/05/2018 0907   PLT 138 (L) 10/05/2018 0531   PLT 115 (L) 06/05/2018 0907   MCV 97.7 10/05/2018 0531   MCV 100 (H) 06/05/2018 0907   MCH 33.3 10/05/2018 0531   MCHC 34.1 10/05/2018 0531   RDW 13.5 10/05/2018 0531   RDW 12.2 06/05/2018 0907   LYMPHSABS 1.0 02/01/2018 1254   MONOABS 1.0 (H) 02/01/2018 1254   EOSABS 0.5 02/01/2018 1254   BASOSABS 0.1 02/01/2018 1254    BMET    Component Value Date/Time   NA 132 (L) 10/06/2018 0445   NA 131 (L) 06/05/2018 0907   K 3.6 10/06/2018 0445   CL 99 10/06/2018 0445   CO2 23 10/06/2018 0445   GLUCOSE 119 (H) 10/06/2018 0445   BUN 46 (H) 10/06/2018 0445   BUN 31 (H) 06/05/2018 0907   CREATININE 2.19 (H) 10/06/2018 0445   CREATININE 0.97 10/14/2015 1233   CALCIUM 8.5 (L) 10/06/2018 0445   GFRNONAA 29 (L) 10/06/2018 0445   GFRAA 34 (L) 10/06/2018 0445    INR    Component Value Date/Time   INR 0.98 02/25/2017 1352     Intake/Output Summary (Last 24 hours) at 10/06/2018 6945 Last data filed at 10/06/2018 0800 Gross per 24 hour  Intake 584.02 ml  Output 1775 ml  Net -1190.98 ml     Assessment/Plan:  68 y.o. male with 8cm AAA  -Patient is aware of large abdominal aortic aneurysm and the need for open surgery to treat with high  likelihood for dialysis -Still unwilling to consent to surgery -Primary team titrating clevidipine; home BP meds restarted -continue pain medication on schedule   Dagoberto Ligas, PA-C [facility] [phone] 10/06/2018 8:22 AM  I have seen and evaluated the patient. I agree with the PA note as documented above. States he knows he is too high risk for surgery and not interested in open repair.  Marty Heck, MD [facility] [place] Office: [phone] Pager: [phone]

## 2018-10-06 NOTE — Telephone Encounter (Signed)
New Message     Pts wife is calling and he is in the ICU and is having a hard time getting his BP down. He is in a lot of pain. She wanted to let Cecille Rubin know

## 2018-10-06 NOTE — Progress Notes (Signed)
PROGRESS NOTE    [name]  DXA:[id number] DOB: 25-Feb-1951 DOA: 10/04/2018 PCP: Alroy Dust, L.Marlou Sa, MD    Brief Narrative:  [name] is a 68 y.o. male who has a PMH as outlined below including but not limited to CAD with MI s/p CABG, infrarenal AAA (followed at [facility]) abdominal hernias not amenable to surgery,  HTN, HLD, CHF, COPD (see "past medical history" for rest).  He presented to [facility] ED 5/13 with severe abdominal pain that began earlier that day.  Pain located near his umbilical hernia which is chronic; however, it has not been amenable to surgery due to close proximity to known infrarenal AAA (was initially scheduled for surgery in 2013 but was then deferred to concerns over aortic neck and he was referred to [facility] where he was last seen in 2018). Pt apparently refused surgery roughly 7-8 years ago despite being told that aneurysm would eventually rupture.  In ED, he was found to have hypertensive emergency with presenting BP of 255/113.  He was started on cleviprex.  CT of the abdomen demonstrated 8cm infrarenal AAA.  Vascular surgery was consulted and medical management was recommended.    Assessment & Plan:   Active Problems:   Abdominal aortic aneurysm (AAA) greater than 5.5 cm in diameter in male [facility])   Hypertensive urgency  Hypertensive emergency Patient initially presenting to ED with severe abdominal pain.  Blood pressure admission noted to be 255/113.  Due to his history of AAA, patient was started on Cleviprex and admitted to the critical care service. --Goal SBP less than 150 per vascular surgery --Restarted on home Coreg 37.5 mg qAM, 25 mg qPM, clonidine 0.2 mg BID, Lasix 40mg  daily --Started on hydralazine 10 mg p.o. TID today --Continues on cleviprex, continue to titrate off --Holding home ACE inhibitor secondary to renal insufficiency  Abdominal aortic aneurysm Patient presenting with abdominal pain and noted to have a large abdominal aortic aneurysm  measuring approximately 8 cm on CT.  Patient has known about this issue in the past and has refused surgery.  He was evaluated by vascular surgery during this hospitalization, and patient would likely need an aortobifemoral bypass and likely would require dialysis.  Aneurysm does not appear ruptured by CT and his hemoglobin remained stable.  Patient elected not to have any surgery at this time.  Vascular surgery recommends BP control with SBP goal less than 150.  Once controlled on oral medications can discharge home.  Patient appears to understand outcome of large on effects aneurysm per vascular surgery  CKD stage III Creatinine baseline over the past year seems to range from 1.24 to 2.21.  Creatinine on admission 2.11, likely towards higher baseline limit.  Etiology of his underlying renal dysfunction likely from poorly controlled hypertension. --Continue to hold home ACE inhibitor with creatinine clearance of 35 --Continue further BP control as above --Avoid nephrotoxins, renally dose all medications --daily BMP  Chronic pain syndrome Home medications include tramadol 50 mg every 6 hours as needed.  Has been utilizing IV fentanyl fairly frequently. --Discontinue IV fentanyl --Hold home tramadol in favor of hydrocodone for now --Supportive care  Chronic diastolic congestive heart failure Appears compensated.  Continue home Lasix  HLD: Continue statin   DVT prophylaxis: SCDs Code Status: DNR Family Communication: Discussed with spouse Gae Bon over the telephone today Disposition Plan: continue inpatient, anticipate dc home in 1-2 days  Consultants:   Vascular surgery  PCCM  Procedures:   none  Antimicrobials:   none  Subjective: Patient seen and examined at bedside, continues with some mild abdominal discomfort.  Continues on cleviprex drip. BP better controlled. Feels a little fatigued. No other specific complaints this morning.  Denies headaches, no fever/chills/night  sweats, no visual changes, no nausea/vomiting/diarrhea, no chest pain, no palpitations, no cough/congestion, no issues with bowel/bladder function, no paresthesias.  No acute events overnight per nursing staff.  Objective: Vitals:   10/06/18 1030 10/06/18 1045 10/06/18 1100 10/06/18 1115  BP: (!) 154/69 126/65 121/60 139/67  Pulse: 64 61 60 62  Resp: 12 12 11 11   Temp:      TempSrc:      SpO2: (!) 88% 91% (!) 89% (!) 89%  Weight:      Height:        Intake/Output Summary (Last 24 hours) at 10/06/2018 1122 Last data filed at 10/06/2018 1100 Gross per 24 hour  Intake 724.6 ml  Output 1975 ml  Net -1250.4 ml   Filed Weights   10/04/18 1750 10/04/18 2300  Weight: 90.7 kg 88 kg    Examination:  General exam: Appears calm and comfortable  Respiratory system: Clear to auscultation. Respiratory effort normal. Cardiovascular system: S1 & S2 heard, RRR. No JVD, murmurs, rubs, gallops or clicks. No pedal edema. Gastrointestinal system: Abdomen is nondistended, protuberant, soft and nontender. No organomegaly or masses felt. Normal bowel sounds heard. Central nervous system: Alert and oriented. No focal neurological deficits. Extremities: Symmetric 5 x 5 power. Skin: No rashes, lesions or ulcers Psychiatry: Judgement and insight appear normal. Mood & affect appropriate.     Data Reviewed: I have personally reviewed following labs and imaging studies  CBC: Recent Labs  Lab 10/04/18 1745 10/05/18 0531  WBC 17.9* 15.0*  HGB 10.6* 10.1*  HCT 30.4* 29.6*  MCV 97.7 97.7  PLT 165 628*   Basic Metabolic Panel: Recent Labs  Lab 10/04/18 1745 10/04/18 1750 10/05/18 0531 10/06/18 0445  NA 134*  --  136 132*  K 3.8  --  4.1 3.6  CL 97*  --  99 99  CO2 22  --  23 23  GLUCOSE 132*  --  121* 119*  BUN 45*  --  45* 46*  CREATININE 2.11* 2.00* 2.18* 2.19*  CALCIUM 9.6  --  9.0 8.5*  MG  --   --  1.7 2.0  PHOS  --   --  3.8 3.8   GFR: Estimated Creatinine Clearance: 35.1  mL/min (A) (by C-G formula based on SCr of 2.19 mg/dL (H)). Liver Function Tests: Recent Labs  Lab 10/04/18 1745  AST 19  ALT 15  ALKPHOS 57  BILITOT 2.0*  PROT 7.0  ALBUMIN 4.1   Recent Labs  Lab 10/04/18 1745  LIPASE 38   No results for input(s): AMMONIA in the last 168 hours. Coagulation Profile: No results for input(s): INR, PROTIME in the last 168 hours. Cardiac Enzymes: No results for input(s): CKTOTAL, CKMB, CKMBINDEX, TROPONINI in the last 168 hours. BNP (last 3 results) No results for input(s): PROBNP in the last 8760 hours. HbA1C: No results for input(s): HGBA1C in the last 72 hours. CBG: Recent Labs  Lab 10/04/18 2238  GLUCAP 107*   Lipid Profile: No results for input(s): CHOL, HDL, LDLCALC, TRIG, CHOLHDL, LDLDIRECT in the last 72 hours. Thyroid Function Tests: No results for input(s): TSH, T4TOTAL, FREET4, T3FREE, THYROIDAB in the last 72 hours. Anemia Panel: No results for input(s): VITAMINB12, FOLATE, FERRITIN, TIBC, IRON, RETICCTPCT in the last 72 hours. Sepsis Labs: Recent Labs  Lab 10/04/18 1745 10/04/18 2320  LATICACIDVEN 1.1 1.8    Recent Results (from the past 240 hour(s))  SARS Coronavirus 2 (CEPHEID - Performed in [facility] hospital lab), Hosp Order     Status: None   Collection Time: 10/04/18  8:14 PM  Result Value Ref Range Status   SARS Coronavirus 2 NEGATIVE NEGATIVE Final    Comment: (NOTE) If result is NEGATIVE SARS-CoV-2 target nucleic acids are NOT DETECTED. The SARS-CoV-2 RNA is generally detectable in upper and lower  respiratory specimens during the acute phase of infection. The lowest  concentration of SARS-CoV-2 viral copies this assay can detect is 250  copies / mL. A negative result does not preclude SARS-CoV-2 infection  and should not be used as the sole basis for treatment or other  patient management decisions.  A negative result may occur with  improper specimen collection / handling, submission of specimen other    than nasopharyngeal swab, presence of viral mutation(s) within the  areas targeted by this assay, and inadequate number of viral copies  (<250 copies / mL). A negative result must be combined with clinical  observations, patient history, and epidemiological information. If result is POSITIVE SARS-CoV-2 target nucleic acids are DETECTED. The SARS-CoV-2 RNA is generally detectable in upper and lower  respiratory specimens dur ing the acute phase of infection.  Positive  results are indicative of active infection with SARS-CoV-2.  Clinical  correlation with patient history and other diagnostic information is  necessary to determine patient infection status.  Positive results do  not rule out bacterial infection or co-infection with other viruses. If result is PRESUMPTIVE POSTIVE SARS-CoV-2 nucleic acids MAY BE PRESENT.   A presumptive positive result was obtained on the submitted specimen  and confirmed on repeat testing.  While 2019 novel coronavirus  (SARS-CoV-2) nucleic acids may be present in the submitted sample  additional confirmatory testing may be necessary for epidemiological  and / or clinical management purposes  to differentiate between  SARS-CoV-2 and other Sarbecovirus currently known to infect humans.  If clinically indicated additional testing with an alternate test  methodology [phone]) is advised. The SARS-CoV-2 RNA is generally  detectable in upper and lower respiratory sp ecimens during the acute  phase of infection. The expected result is Negative. Fact Sheet for Patients:  StrictlyIdeas.no Fact Sheet for Healthcare Providers: BankingDealers.co.za This test is not yet approved or cleared by the [place] FDA and has been authorized for detection and/or diagnosis of SARS-CoV-2 by FDA under an Emergency Use Authorization (EUA).  This EUA will remain in effect (meaning this test can be used) for the duration of  the COVID-19 declaration under Section 564(b)(1) of the Act, 21 U.S.C. section 360bbb-3(b)(1), unless the authorization is terminated or revoked sooner. Performed at [facility], [place] [address], [place], [place] [address]   MRSA PCR Screening     Status: None   Collection Time: 10/05/18  1:34 AM  Result Value Ref Range Status   MRSA by PCR NEGATIVE NEGATIVE Final    Comment:        The GeneXpert MRSA Assay (FDA approved for NASAL specimens only), is one component of a comprehensive MRSA colonization surveillance program. It is not intended to diagnose MRSA infection nor to guide or monitor treatment for MRSA infections. Performed at [facility], [place] [address], [place], [place] [address]          Radiology Studies: Ct Abdomen Pelvis Wo Contrast  Result Date:  10/04/2018 CLINICAL DATA:  Hernia. EXAM: CT ABDOMEN AND PELVIS WITHOUT CONTRAST TECHNIQUE: Multidetector CT imaging of the abdomen and pelvis was performed following the standard protocol without IV contrast. COMPARISON:  CT abdomen pelvis dated April 14, 2012. FINDINGS: Lower chest: No acute abnormality. Hepatobiliary: No focal liver abnormality. Tiny gallstone. No gallbladder wall thickening or biliary dilatation. Pancreas: Unremarkable. No pancreatic ductal dilatation or surrounding inflammatory changes. Spleen: Normal in size without focal abnormality. Adrenals/Urinary Tract: The adrenal glands are unremarkable. Bilateral renal atrophy with perinephric fat stranding. 1.6 cm right renal simple cyst. No renal calculi or hydronephrosis. The bladder is unremarkable for the degree of distention. Stomach/Bowel: Stomach is within normal limits. Appendix appears normal. No evidence of bowel wall thickening, distention, or inflammatory changes. Vascular/Lymphatic: Interval enlargement of the infrarenal abdominal aortic aneurysm, now measuring 8.0 cm, previously 4.9 cm. Aortoiliac atherosclerosis. No enlarged abdominal  or pelvic lymph nodes. Reproductive: Prostate is unremarkable. Other: Moderate fat containing umbilical hernia. Small fat containing right inguinal hernia. No free fluid or pneumoperitoneum. Musculoskeletal: No acute or significant osseous findings. IMPRESSION: 1. Moderate fat containing umbilical hernia. Small fat containing right inguinal hernia. No inflammatory changes within the hernias. 2. 8.0 cm infrarenal abdominal aortic aneurysm. Vascular surgery consultation recommended due to increased risk of rupture for AAA >5.5 cm. This recommendation follows ACR consensus guidelines: White Paper of the ACR Incidental Findings Committee II on Vascular Findings. J Am Coll Radiol 2013; 10:789-794. Aortic aneurysm NOS (ICD10-I71.9) 3.  Aortic atherosclerosis (ICD10-I70.0). 4. Cholelithiasis. Electronically Signed   By: Titus Dubin M.D.   On: 10/04/2018 18:40        Scheduled Meds:  carvedilol  25 mg Oral QPM   carvedilol  37.5 mg Oral q morning - 10a   cloNIDine  0.2 mg Oral BID   famotidine  10 mg Oral QHS   ferrous sulfate  325 mg Oral Q breakfast   folic acid  1 mg Oral Daily   furosemide  40 mg Oral Daily   hydrALAZINE  10 mg Oral Q8H   loratadine  10 mg Oral Daily   rosuvastatin  20 mg Oral QHS   sodium chloride flush  3 mL Intravenous Once   thiamine  100 mg Oral Daily   Continuous Infusions:  sodium chloride 10 mL/hr at 10/06/18 1041   clevidipine 5 mg/hr (10/06/18 1100)     LOS: 2 days    Time spent: 36 minutes    Travaris Kosh J [place] DO [facility] Pager [phone]  If 7PM-7AM, please contact night-coverage [email or web address] Password [email or web address] 10/06/2018, 11:22 AM

## 2018-10-07 LAB — BASIC METABOLIC PANEL
Anion gap: 10 (ref 5–15)
BUN: 56 mg/dL — ABNORMAL HIGH (ref 8–23)
CO2: 22 mmol/L (ref 22–32)
Calcium: 8.5 mg/dL — ABNORMAL LOW (ref 8.9–10.3)
Chloride: 100 mmol/L (ref 98–111)
Creatinine, Ser: 2.52 mg/dL — ABNORMAL HIGH (ref 0.61–1.24)
GFR calc Af Amer: 29 mL/min — ABNORMAL LOW (ref >60)
GFR calc non Af Amer: 25 mL/min — ABNORMAL LOW (ref >60)
Glucose, Bld: 121 mg/dL — ABNORMAL HIGH (ref 70–99)
Potassium: 3.8 mmol/L (ref 3.5–5.1)
Sodium: 132 mmol/L — ABNORMAL LOW (ref 135–145)

## 2018-10-07 MED ORDER — HYDRALAZINE HCL 50 MG PO TABS
50.0000 mg | ORAL_TABLET | Freq: Three times a day (TID) | ORAL | Status: DC
  Administered 2018-10-07 – 2018-10-08 (×3): 50 mg via ORAL
  Filled 2018-10-07 (×4): qty 1

## 2018-10-07 NOTE — Progress Notes (Signed)
PROGRESS NOTE    [name]  TKZ:[id number] DOB: 1951/02/26 DOA: 10/04/2018 PCP: Alroy Dust, L.Marlou Sa, MD    Brief Narrative:  [name] is a 68 y.o. male who has a PMH as outlined below including but not limited to CAD with MI s/p CABG, infrarenal AAA (followed at [facility]) abdominal hernias not amenable to surgery,  HTN, HLD, CHF, COPD (see "past medical history" for rest).  He presented to [facility] ED 5/13 with severe abdominal pain that began earlier that day.  Pain located near his umbilical hernia which is chronic; however, it has not been amenable to surgery due to close proximity to known infrarenal AAA (was initially scheduled for surgery in 2013 but was then deferred to concerns over aortic neck and he was referred to [facility] where he was last seen in 2018). Pt apparently refused surgery roughly 7-8 years ago despite being told that aneurysm would eventually rupture.  In ED, he was found to have hypertensive emergency with presenting BP of 255/113.  He was started on cleviprex.  CT of the abdomen demonstrated 8cm infrarenal AAA.  Vascular surgery was consulted and medical management was recommended.    Assessment & Plan:   Active Problems:   Abdominal aortic aneurysm (AAA) greater than 5.5 cm in diameter in male [facility])   Hypertensive urgency  Hypertensive emergency Patient initially presenting to ED with severe abdominal pain.  Blood pressure admission noted to be 255/113.  Due to his history of AAA, patient was started on Cleviprex and admitted to the critical care service. --Goal SBP less than 150 per vascular surgery --Restarted on home Coreg 37.5 mg qAM, 25 mg qPM, clonidine 0.2 mg BID, Lasix 40mg  daily --Increased hydralazine from 25 mg to 50 mg p.o. TID today  --cleviprex titrated off as of 2 AM this morning --Holding home ACE inhibitor secondary to renal insufficiency  Abdominal aortic aneurysm Patient presenting with abdominal pain and noted to have a large abdominal  aortic aneurysm measuring approximately 8 cm on CT.  Patient has known about this issue in the past and has refused surgery.  He was evaluated by vascular surgery during this hospitalization, and patient would likely need an aortobifemoral bypass and likely would require dialysis.  Aneurysm does not appear ruptured by CT and his hemoglobin remained stable.  Patient elected not to have any surgery at this time.  Vascular surgery recommends BP control with SBP goal less than 150.  Once controlled on oral medications can discharge home.  Patient appears to understand outcome of large on effects aneurysm per vascular surgery  CKD stage III Creatinine baseline over the past year seems to range from 1.24 to 2.21.  Creatinine on admission 2.11, likely towards higher baseline limit.  Etiology of his underlying renal dysfunction likely from poorly controlled hypertension. --Continue to hold home ACE inhibitor with creatinine clearance of 35 --Continue further BP control as above --Avoid nephrotoxins, renally dose all medications --daily BMP  Chronic pain syndrome Home medications include tramadol 50 mg every 6 hours as needed.  Has been utilizing IV fentanyl fairly frequently. --Discontinue IV fentanyl --Hold home tramadol in favor of hydrocodone for now --Supportive care  Chronic diastolic congestive heart failure Appears compensated.  Continue home Lasix  HLD: Continue statin   DVT prophylaxis: SCDs Code Status: DNR Family Communication: Discussed with spouse Gae Bon over the telephone today Disposition Plan: continue inpatient, anticipate dc home tomorrow, awaiting PT evaluation for discharge needs  Consultants:   Vascular surgery  PCCM  Procedures:   none  Antimicrobials:   none   Subjective: Patient seen and examined at bedside, resting comfortably.  Nursing present.  Continues with some mild abdominal discomfort.  Titrated off of cleviprex drip early this morning.  BP better  controlled.  Reports has not had a bowel movement in 1-2 days, but this is not unexpected for him.  Awaiting PT evaluation for discharge needs as he has been not very active during this hospitalization.  No other complaints today. Denies headaches, no fever/chills/night sweats, no visual changes, no nausea/vomiting/diarrhea, no chest pain, no palpitations, no cough/congestion, no issues with bowel/bladder function, no paresthesias.  No acute events overnight per nursing staff.  Objective: Vitals:   10/07/18 0900 10/07/18 1000 10/07/18 1100 10/07/18 1130  BP: 137/72 130/71 121/66   Pulse: 63 (!) 58 61   Resp: (!) 7 (!) 8 13   Temp:    98.5 F (36.9 C)  TempSrc:    Oral  SpO2: 94% 94% 95%   Weight:      Height:        Intake/Output Summary (Last 24 hours) at 10/07/2018 1214 Last data filed at 10/07/2018 1100 Gross per 24 hour  Intake 213.63 ml  Output 725 ml  Net -511.37 ml   Filed Weights   10/04/18 1750 10/04/18 2300 10/07/18 0500  Weight: 90.7 kg 88 kg 87.8 kg    Examination:  General exam: Appears calm and comfortable  Respiratory system: Clear to auscultation. Respiratory effort normal. Cardiovascular system: S1 & S2 heard, RRR. No JVD, murmurs, rubs, gallops or clicks. No pedal edema. Gastrointestinal system: Abdomen is nondistended, protuberant, soft and nontender. No organomegaly or masses felt. Normal bowel sounds heard. Central nervous system: Alert and oriented. No focal neurological deficits. Extremities: Symmetric 5 x 5 power. Skin: No rashes, lesions or ulcers Psychiatry: Judgement and insight appear normal. Mood & affect appropriate.     Data Reviewed: I have personally reviewed following labs and imaging studies  CBC: Recent Labs  Lab 10/04/18 1745 10/05/18 0531  WBC 17.9* 15.0*  HGB 10.6* 10.1*  HCT 30.4* 29.6*  MCV 97.7 97.7  PLT 165 229*   Basic Metabolic Panel: Recent Labs  Lab 10/04/18 1745 10/04/18 1750 10/05/18 0531 10/06/18 0445  10/07/18 0321  NA 134*  --  136 132* 132*  K 3.8  --  4.1 3.6 3.8  CL 97*  --  99 99 100  CO2 22  --  23 23 22   GLUCOSE 132*  --  121* 119* 121*  BUN 45*  --  45* 46* 56*  CREATININE 2.11* 2.00* 2.18* 2.19* 2.52*  CALCIUM 9.6  --  9.0 8.5* 8.5*  MG  --   --  1.7 2.0  --   PHOS  --   --  3.8 3.8  --    GFR: Estimated Creatinine Clearance: 30.4 mL/min (A) (by C-G formula based on SCr of 2.52 mg/dL (H)). Liver Function Tests: Recent Labs  Lab 10/04/18 1745  AST 19  ALT 15  ALKPHOS 57  BILITOT 2.0*  PROT 7.0  ALBUMIN 4.1   Recent Labs  Lab 10/04/18 1745  LIPASE 38   No results for input(s): AMMONIA in the last 168 hours. Coagulation Profile: No results for input(s): INR, PROTIME in the last 168 hours. Cardiac Enzymes: No results for input(s): CKTOTAL, CKMB, CKMBINDEX, TROPONINI in the last 168 hours. BNP (last 3 results) No results for input(s): PROBNP in the last 8760 hours. HbA1C: No results for input(s):  HGBA1C in the last 72 hours. CBG: Recent Labs  Lab 10/04/18 2238  GLUCAP 107*   Lipid Profile: No results for input(s): CHOL, HDL, LDLCALC, TRIG, CHOLHDL, LDLDIRECT in the last 72 hours. Thyroid Function Tests: No results for input(s): TSH, T4TOTAL, FREET4, T3FREE, THYROIDAB in the last 72 hours. Anemia Panel: No results for input(s): VITAMINB12, FOLATE, FERRITIN, TIBC, IRON, RETICCTPCT in the last 72 hours. Sepsis Labs: Recent Labs  Lab 10/04/18 1745 10/04/18 2320  LATICACIDVEN 1.1 1.8    Recent Results (from the past 240 hour(s))  SARS Coronavirus 2 (CEPHEID - Performed in [place] hospital lab), Hosp Order     Status: None   Collection Time: 10/04/18  8:14 PM  Result Value Ref Range Status   SARS Coronavirus 2 NEGATIVE NEGATIVE Final    Comment: (NOTE) If result is NEGATIVE SARS-CoV-2 target nucleic acids are NOT DETECTED. The SARS-CoV-2 RNA is generally detectable in upper and lower  respiratory specimens during the acute phase of infection.  The lowest  concentration of SARS-CoV-2 viral copies this assay can detect is 250  copies / mL. A negative result does not preclude SARS-CoV-2 infection  and should not be used as the sole basis for treatment or other  patient management decisions.  A negative result may occur with  improper specimen collection / handling, submission of specimen other  than nasopharyngeal swab, presence of viral mutation(s) within the  areas targeted by this assay, and inadequate number of viral copies  (<250 copies / mL). A negative result must be combined with clinical  observations, patient history, and epidemiological information. If result is POSITIVE SARS-CoV-2 target nucleic acids are DETECTED. The SARS-CoV-2 RNA is generally detectable in upper and lower  respiratory specimens dur ing the acute phase of infection.  Positive  results are indicative of active infection with SARS-CoV-2.  Clinical  correlation with patient history and other diagnostic information is  necessary to determine patient infection status.  Positive results do  not rule out bacterial infection or co-infection with other viruses. If result is PRESUMPTIVE POSTIVE SARS-CoV-2 nucleic acids MAY BE PRESENT.   A presumptive positive result was obtained on the submitted specimen  and confirmed on repeat testing.  While 2019 novel coronavirus  (SARS-CoV-2) nucleic acids may be present in the submitted sample  additional confirmatory testing may be necessary for epidemiological  and / or clinical management purposes  to differentiate between  SARS-CoV-2 and other Sarbecovirus currently known to infect humans.  If clinically indicated additional testing with an alternate test  methodology [phone]) is advised. The SARS-CoV-2 RNA is generally  detectable in upper and lower respiratory sp ecimens during the acute  phase of infection. The expected result is Negative. Fact Sheet for Patients:   StrictlyIdeas.no Fact Sheet for Healthcare Providers: BankingDealers.co.za This test is not yet approved or cleared by the [place] FDA and has been authorized for detection and/or diagnosis of SARS-CoV-2 by FDA under an Emergency Use Authorization (EUA).  This EUA will remain in effect (meaning this test can be used) for the duration of the COVID-19 declaration under Section 564(b)(1) of the Act, 21 U.S.C. section 360bbb-3(b)(1), unless the authorization is terminated or revoked sooner. Performed at [facility], [employer] [place] [address] [place], [place] [address]   MRSA PCR Screening     Status: None   Collection Time: 10/05/18  1:34 AM  Result Value Ref Range Status   MRSA by PCR NEGATIVE NEGATIVE Final    Comment:  The GeneXpert MRSA Assay (FDA approved for NASAL specimens only), is one component of a comprehensive MRSA colonization surveillance program. It is not intended to diagnose MRSA infection nor to guide or monitor treatment for MRSA infections. Performed at [facility], [place] [address], [place], [place] [address]          Radiology Studies: No results found.      Scheduled Meds:  carvedilol  25 mg Oral QPM   carvedilol  37.5 mg Oral q morning - 10a   cloNIDine  0.2 mg Oral BID   famotidine  10 mg Oral QHS   ferrous sulfate  325 mg Oral Q breakfast   folic acid  1 mg Oral Daily   furosemide  40 mg Oral Daily   hydrALAZINE  50 mg Oral Q8H   loratadine  10 mg Oral Daily   rosuvastatin  20 mg Oral QHS   sodium chloride flush  3 mL Intravenous Once   thiamine  100 mg Oral Daily   Continuous Infusions:  sodium chloride 10 mL/hr at 10/06/18 1800   clevidipine Stopped (10/07/18 0204)     LOS: 3 days    Time spent: 32 minutes    Charlene Cowdrey J [place] DO [facility] Pager [phone]  If 7PM-7AM, please contact night-coverage [email or web address] Password [facility] 10/07/2018,  12:14 PM

## 2018-10-07 NOTE — Evaluation (Signed)
Physical Therapy Evaluation Patient Details Name: [name] MRN: [id number] DOB: 1950-12-24 Today's Date: 10/07/2018   History of Present Illness  [name] is a 68 y.o. male who has a PMH as outlined below including but not limited to CAD with MI s/p CABG, infrarenal AAA ([facility] at [facility]) abdominal hernias not amenable to surgery,  HTN, HLD, CHF, COPD (see "past medical history" for rest).  He presented to [facility] ED 5/13 with severe abdominal pain that began earlier that day.    Clinical Impression  Pt admitted with above diagnosis. Pt currently with functional limitations due to the deficits listed below (see PT Problem List). PTA, pt living with wife, independent with mobility. Upon entry today, pt just returned to bed with nursing from 3 hours in chair reports fatigue. VSS on RA. Ambulated to bathroom and back with RW and poor stability. Will progress ambulation next PT session and update recs for HHPT if needed.  Pt will benefit from skilled PT to increase their independence and safety with mobility to allow discharge to the venue listed below.       Follow Up Recommendations Home health PT(pending progress )    Equipment Recommendations  None recommended by PT    Recommendations for Other Services       Precautions / Restrictions Precautions Precautions: Fall Restrictions Weight Bearing Restrictions: No      Mobility  Bed Mobility Overal bed mobility: Modified Independent                Transfers Overall transfer level: Modified independent Equipment used: Rolling walker (2 wheeled)                Ambulation/Gait Ambulation/Gait assistance: Supervision;Min guard Gait Distance (Feet): 15 Feet Assistive device: Rolling walker (2 wheeled) Gait Pattern/deviations: Step-to pattern     General Gait Details: pt with   Stairs            Wheelchair Mobility    Modified Rankin (Stroke Patients Only)       Balance Overall balance  assessment: Needs assistance   Sitting balance-Leahy Scale: Fair       Standing balance-Leahy Scale: Fair                               Pertinent Vitals/Pain      Home Living Family/patient expects to be discharged to:: Private residence Living Arrangements: Spouse/significant other Available Help at Discharge: Family;Available 24 hours/day Type of Home: House Home Access: Stairs to enter Entrance Stairs-Rails: Can reach both Entrance Stairs-Number of Steps: 3 Home Layout: One level Home Equipment: Walker - 2 wheels      Prior Function Level of Independence: Independent               Hand Dominance        Extremity/Trunk Assessment   Upper Extremity Assessment Upper Extremity Assessment: Overall WFL for tasks assessed    Lower Extremity Assessment Lower Extremity Assessment: Overall WFL for tasks assessed       Communication   Communication: No difficulties  Cognition Arousal/Alertness: Awake/alert Behavior During Therapy: WFL for tasks assessed/performed Overall Cognitive Status: Within Functional Limits for tasks assessed                                        General Comments  Exercises     Assessment/Plan    PT Assessment Patient needs continued PT services  PT Problem List Decreased strength       PT Treatment Interventions DME instruction;Gait training;Stair training;Functional mobility training;Therapeutic activities;Therapeutic exercise;Balance training    PT Goals (Current goals can be found in the Care Plan section)  Acute Rehab PT Goals PT Goal Formulation: With patient Time For Goal Achievement: 10/21/18 Potential to Achieve Goals: Good    Frequency Min 3X/week   Barriers to discharge        Co-evaluation               AM-PAC PT "6 Clicks" Mobility  Outcome Measure Help needed turning from your back to your side while in a flat bed without using bedrails?: None Help needed moving  from lying on your back to sitting on the side of a flat bed without using bedrails?: A Little Help needed moving to and from a bed to a chair (including a wheelchair)?: A Little Help needed standing up from a chair using your arms (e.g., wheelchair or bedside chair)?: A Little Help needed to walk in hospital room?: A Little Help needed climbing 3-5 steps with a railing? : A Lot 6 Click Score: 18    End of Session Equipment Utilized During Treatment: Gait belt Activity Tolerance: Patient tolerated treatment well Patient left: in bed Nurse Communication: Mobility status PT Visit Diagnosis: Unsteadiness on feet (R26.81)    Time: 1500-1520 PT Time Calculation (min) (ACUTE ONLY): 20 min   Charges:   PT Evaluation $PT Eval Moderate Complexity: 1 Mod          Reinaldo Berber, PT, DPT Acute Rehabilitation Services Pager: [phone] Office: [phone]    Reinaldo Berber 10/07/2018, 3:09 PM

## 2018-10-07 NOTE — Progress Notes (Signed)
Pt got up to [facility] and bathroom about 5-6 different times today attempting a bowel movement. Pt had a few small bowel movements. Physical Therapy came by and assisted him is moving to bathroom commode. Pt used a walker for assist. Pt also sat in chair for 2-3 hrs today. Pt expressed some fatigue in the afternoon.   Dewaine Oats, RN

## 2018-10-07 NOTE — TOC Initial Note (Signed)
Transition of Care [facility]) - Initial/Assessment Note    Patient Details  Name: [name] MRN: [id number] Date of Birth: Mar 12, 1951  Transition of Care [facility]) CM/SW Contact:    Carles Collet, RN Phone Number: 10/07/2018, 5:00 PM  Clinical Narrative:                Spoke to patient's wife on the phone. She states that they are familiar with [facility] from previous use and would like to use them again. She states that they have RW WC 3/1 at home. She does not endorse needing any additional DME. We discussed that he may return home tomorrow. Referral for [facility] accepted by [facility]. No other CM needs identified.    Expected Discharge Plan: [place] Barriers to Discharge: Continued Medical Work up   Patient Goals and CMS Choice Patient states their goals for this hospitalization and ongoing recovery are:: to return home CMS Medicare.gov Compare Post Acute Care list provided to:: Patient Represenative (must comment)(spouse) Choice offered to / list presented to : Spouse  Expected Discharge Plan and Services Expected Discharge Plan: [place]   Discharge Planning Services: CM Consult Post Acute Care Choice: [place] arrangements for the past 2 months: Single Family Home                           HH Arranged: RN, PT [facility] Agency: [place] ([facility]) Date HH Agency Contacted: 10/07/18 Time HH Agency Contacted: 1700 Representative spoke with at [place]: Corene Cornea  Prior Living Arrangements/Services Living arrangements for the past 2 months: [place] with:: Spouse Patient language and need for interpreter reviewed:: Yes Do you feel safe going back to the place where you live?: Yes          Current home services: DME Criminal Activity/Legal Involvement Pertinent to Current Situation/Hospitalization: No - Comment as needed  Activities of Daily Living      Permission Sought/Granted                  Emotional  Assessment              Admission diagnosis:  Belly buttin pain Patient Active Problem List   Diagnosis Date Noted  . Umbilical hernia without obstruction and without gangrene   . Epistaxis 02/25/2017  . Hypertensive urgency 02/25/2017  . Urticaria with associated angioedema 08/05/2015  . Angioedema 07/22/2015  . Encounter for therapeutic drug monitoring 01/08/2015  . Acute pulmonary embolism ([place]) 01/03/2015  . Acute DVT (deep venous thrombosis) ([place]) 12/27/2014  . S/P CABG x 5 12/13/14 12/27/2014  . Anemia 12/27/2014  . Acute renal failure ([place]) 12/27/2014  . AKI (acute kidney injury) ([place])   . Absolute anemia   . CAD (coronary artery disease) 12/13/2014  . NSTEMI (non-ST elevated myocardial infarction) ([place]) 12/09/2014  . Hyponatremia   . Unstable angina pectoris ([place]) 12/07/2014  . Thoracic aneurysm without mention of rupture 04/14/2012  . Intermittent claudication ([place]) 03/10/2012  . Atherosclerosis of native arteries of extremity with intermittent claudication ([place]) 10/08/2011  . Abdominal aortic aneurysm (AAA) greater than 5.5 cm in diameter in male ([place]) 09/07/2011  . Carotid stenosis, bilateral 09/07/2011  . Atherosclerosis of native coronary artery with unstable angina pectoris ([place]) 01/03/2011  . Essential hypertension, benign 01/03/2011  . Hyperlipidemia with target LDL less than 70 01/03/2011  . PAD (peripheral artery disease) ([place]) 01/03/2011   PCP:  Alroy Dust,  L.Dean, MD Pharmacy:   [facility] [place], [place] - [address] [place] [address] Phone: [phone] Fax: [phone]     Social Determinants of Health (SDOH) Interventions    Readmission Risk Interventions No flowsheet data found.

## 2018-10-08 MED ORDER — FERROUS SULFATE 325 (65 FE) MG PO TABS: 325.0000 mg | ORAL_TABLET | Freq: Every day | ORAL | 3 refills | Status: AC

## 2018-10-08 MED ORDER — HYDRALAZINE HCL 50 MG PO TABS: 50.0000 mg | ORAL_TABLET | Freq: Three times a day (TID) | ORAL | 0 refills | Status: DC

## 2018-10-08 NOTE — TOC Transition Note (Signed)
Transition of Care [facility]) - CM/SW Discharge Note   Patient Details  Name: [name] MRN: [id number] Date of Birth: 03-10-1951  Transition of Care [facility]) CM/SW Contact:  Carles Collet, RN Phone Number: 10/08/2018, 9:55 AM   Clinical Narrative:    [facility] that patient will DC today, no other CM needs identified.   Patient Goals and CMS Choice Patient states their goals for this hospitalization and ongoing recovery are:: to return home CMS Medicare.gov Compare Post Acute Care list provided to:: Patient Represenative (must comment)(spouse) Choice offered to / list presented to : Spouse    Final next level of care: [place] Barriers to Discharge: No Barriers Identified   Patient Goals and CMS Choice Patient states their goals for this hospitalization and ongoing recovery are:: to return home CMS Medicare.gov Compare Post Acute Care list provided to:: Patient Represenative (must comment)(spouse) Choice offered to / list presented to : Spouse  Discharge Placement                       Discharge Plan and Services   Discharge Planning Services: CM Consult Post Acute Care Choice: Home Health                    HH Arranged: RN, PT [facility] Agency: [place] ([facility]) Date [facility] Agency Contacted: 10/08/18 Time [place]: [phone] Representative spoke with at [place]: [place] ([place]) Interventions     Readmission Risk Interventions No flowsheet data found.

## 2018-10-08 NOTE — Discharge Instructions (Signed)
Abdominal Aortic Aneurysm  An aneurysm is a bulge in one of the blood vessels that carry blood away from the heart (artery). It happens when blood pushes up against a weak or damaged place in the wall of an artery. An abdominal aortic aneurysm happens in the main artery of the body (aorta). Some aneurysms may not cause problems. If it grows, it can burst or tear, causing bleeding inside the body. This is an emergency. It needs to be treated right away. What are the causes? The exact cause of this condition is not known. What increases the risk? The following may make you more likely to get this condition:  Being a male who is 15 years of age or older.  Being white (Caucasian).  Using tobacco.  Having a family history of aneurysms.  Having the following conditions: ? Hardening of the arteries (arteriosclerosis). ? Inflammation of the walls of an artery (arteritis). ? Certain genetic conditions. ? Being very overweight (obesity). ? An infection in the wall of the aorta (infectious aortitis). ? High cholesterol. ? High blood pressure (hypertension). What are the signs or symptoms? Symptoms depend on the size of the aneurysm and how fast it is growing. Most grow slowly and do not cause any symptoms. If symptoms do occur, they may include:  Pain in the belly (abdomen), side, or back.  Feeling full after eating only small amounts of food.  Feeling a throbbing lump in the belly. Symptoms that the aneurysm has burst (ruptured) include:  Sudden, very bad pain in the belly, side, or back.  Feeling sick to your stomach (nauseous).  Throwing up (vomiting).  Feeling light-headed or passing out. How is this treated? Treatment for this condition depends on:  The size of the aneurysm.  How fast it is growing.  Your age.  Your risk of having it burst. If your aneurysm is smaller than 2 inches (5 cm), your doctor may manage it by:  Checking it often to see if it is getting bigger.  You may have an imaging test (ultrasound) to check it every 3-6 months, every year, or every few years.  Giving you medicines to: ? Control blood pressure. ? Treat pain. ? Fight infection. If your aneurysm is larger than 2 inches (5 cm), you may need surgery to fix it. Follow these instructions at home: Lifestyle  Do not use any products that have nicotine or tobacco in them. This includes cigarettes, e-cigarettes, and chewing tobacco. If you need help quitting, ask your doctor.  Get regular exercise. Ask your doctor what types of exercise are best for you. Eating and drinking  Eat a heart-healthy diet. This includes eating plenty of: ? Fresh fruits and vegetables. ? Whole grains. ? Low-fat (lean) protein. ? Low-fat dairy products.  Avoid foods that are high in saturated fat and cholesterol. These foods include red meat and some dairy products.  Do not drink alcohol if: ? Your doctor tells you not to drink. ? You are pregnant, may be pregnant, or are planning to become pregnant.  If you drink alcohol: ? Limit how much you use to:  0-1 drink a day for women.  0-2 drinks a day for men. ? Be aware of how much alcohol is in your drink. In the [place] one drink equals any of these:  One typical bottle of beer (12 oz).  One-half glass of wine (5 oz).  One shot of hard liquor (1 oz). General instructions  Take over-the-counter and prescription medicines only as  told by your doctor.  Keep your blood pressure within normal limits. Ask your doctor what your blood pressure should be.  Have your blood sugar (glucose) level and cholesterol levels checked regularly. Keep your blood sugar level and cholesterol levels within normal limits.  Avoid heavy lifting and activities that take a lot of effort. Ask your doctor what activities are safe for you.  Keep all follow-up visits as told by your doctor. This is important. ? Talk to your doctor about regular screenings to see if the  aneurysm is getting bigger. Contact a doctor if you:  Have pain in your belly, side, or back.  Have a throbbing feeling in your belly.  Have a family history of aneurysms. Get help right away if you:  Have sudden, bad pain in your belly, side, or back.  Feel sick to your stomach.  Throw up.  Have trouble pooping (constipation).  Have trouble peeing (urinating).  Feel light-headed.  Have a fast heart rate when you stand.  Have sweaty skin that is cold to the touch (clammy).  Have shortness of breath.  Have a fever. These symptoms may be an emergency. Do not wait to see if the symptoms will go away. Get medical help right away. Call your local emergency services (911 in the [place] Do not drive yourself to the hospital. Summary  An aneurysm is a bulge in one of the blood vessels that carry blood away from the heart (artery). Some aneurysms may not cause problems.  You may need to have yours checked often. If it grows, it can burst or tear. This causes bleeding inside the body. It needs to be treated right away.  Follow instructions from your doctor about healthy lifestyle changes.  Keep all follow-up visits as told by your doctor. This is important. This information is not intended to replace advice given to you by your health care provider. Make sure you discuss any questions you have with your health care provider. Document Released: 09/04/2012 Document Revised: 12/17/2017 Document Reviewed: 12/17/2017 Elsevier Interactive Patient Education  2019 [employer].  Managing Your Hypertension Hypertension is commonly called high blood pressure. This is when the force of your blood pressing against the walls of your arteries is too strong. Arteries are blood vessels that carry blood from your heart throughout your body. Hypertension forces the heart to work harder to pump blood, and may cause the arteries to become narrow or stiff. Having untreated or uncontrolled hypertension  can cause heart attack, stroke, kidney disease, and other problems. What are blood pressure readings? A blood pressure reading consists of a higher number over a lower number. Ideally, your blood pressure should be below 120/80. The first ("top") number is called the systolic pressure. It is a measure of the pressure in your arteries as your heart beats. The second ("bottom") number is called the diastolic pressure. It is a measure of the pressure in your arteries as the heart relaxes. What does my blood pressure reading mean? Blood pressure is classified into four stages. Based on your blood pressure reading, your health care provider may use the following stages to determine what type of treatment you need, if any. Systolic pressure and diastolic pressure are measured in a unit called mm Hg. Normal  Systolic pressure: below 673.  Diastolic pressure: below 80. Elevated  Systolic pressure: 419-379.  Diastolic pressure: below 80. Hypertension stage 1  Systolic pressure: 024-097.  Diastolic pressure: 35-32. Hypertension stage 2  Systolic pressure: 992 or above.  Diastolic pressure: 90 or above. What health risks are associated with hypertension? Managing your hypertension is an important responsibility. Uncontrolled hypertension can lead to:  A heart attack.  A stroke.  A weakened blood vessel (aneurysm).  Heart failure.  Kidney damage.  Eye damage.  Metabolic syndrome.  Memory and concentration problems. What changes can I make to manage my hypertension? Hypertension can be managed by making lifestyle changes and possibly by taking medicines. Your health care provider will help you make a plan to bring your blood pressure within a normal range. Eating and drinking   Eat a diet that is high in fiber and potassium, and low in salt (sodium), added sugar, and fat. An example eating plan is called the DASH (Dietary Approaches to Stop Hypertension) diet. To eat this  way: ? Eat plenty of fresh fruits and vegetables. Try to fill half of your plate at each meal with fruits and vegetables. ? Eat whole grains, such as whole wheat pasta, brown rice, or whole grain bread. Fill about one quarter of your plate with whole grains. ? Eat low-fat diary products. ? Avoid fatty cuts of meat, processed or cured meats, and poultry with skin. Fill about one quarter of your plate with lean proteins such as fish, chicken without skin, beans, eggs, and tofu. ? Avoid premade and processed foods. These tend to be higher in sodium, added sugar, and fat.  Reduce your daily sodium intake. Most people with hypertension should eat less than 1,500 mg of sodium a day.  Limit alcohol intake to no more than 1 drink a day for nonpregnant women and 2 drinks a day for men. One drink equals 12 oz of beer, 5 oz of wine, or 1 oz of hard liquor. Lifestyle  Work with your health care provider to maintain a healthy body weight, or to lose weight. Ask what an ideal weight is for you.  Get at least 30 minutes of exercise that causes your heart to beat faster (aerobic exercise) most days of the week. Activities may include walking, swimming, or biking.  Include exercise to strengthen your muscles (resistance exercise), such as weight lifting, as part of your weekly exercise routine. Try to do these types of exercises for 30 minutes at least 3 days a week.  Do not use any products that contain nicotine or tobacco, such as cigarettes and e-cigarettes. If you need help quitting, ask your health care provider.  Control any long-term (chronic) conditions you have, such as high cholesterol or diabetes. Monitoring  Monitor your blood pressure at home as told by your health care provider. Your personal target blood pressure may vary depending on your medical conditions, your age, and other factors.  Have your blood pressure checked regularly, as often as told by your health care provider. Working with  your health care provider  Review all the medicines you take with your health care provider because there may be side effects or interactions.  Talk with your health care provider about your diet, exercise habits, and other lifestyle factors that may be contributing to hypertension.  Visit your health care provider regularly. Your health care provider can help you create and adjust your plan for managing hypertension. Will I need medicine to control my blood pressure? Your health care provider may prescribe medicine if lifestyle changes are not enough to get your blood pressure under control, and if:  Your systolic blood pressure is 130 or higher.  Your diastolic blood pressure is 80 or higher. Take  medicines only as told by your health care provider. Follow the directions carefully. Blood pressure medicines must be taken as prescribed. The medicine does not work as well when you skip doses. Skipping doses also puts you at risk for problems. Contact a health care provider if:  You think you are having a reaction to medicines you have taken.  You have repeated (recurrent) headaches.  You feel dizzy.  You have swelling in your ankles.  You have trouble with your vision. Get help right away if:  You develop a severe headache or confusion.  You have unusual weakness or numbness, or you feel faint.  You have severe pain in your chest or abdomen.  You vomit repeatedly.  You have trouble breathing. Summary  Hypertension is when the force of blood pumping through your arteries is too strong. If this condition is not controlled, it may put you at risk for serious complications.  Your personal target blood pressure may vary depending on your medical conditions, your age, and other factors. For most people, a normal blood pressure is less than 120/80.  Hypertension is managed by lifestyle changes, medicines, or both. Lifestyle changes include weight loss, eating a healthy, low-sodium  diet, exercising more, and limiting alcohol. This information is not intended to replace advice given to you by your health care provider. Make sure you discuss any questions you have with your health care provider. Document Released: 02/02/2012 Document Revised: 04/07/2016 Document Reviewed: 04/07/2016 Elsevier Interactive Patient Education  2019 [employer].  Hypertension Hypertension, commonly called high blood pressure, is when the force of blood pumping through the arteries is too strong. The arteries are the blood vessels that carry blood from the heart throughout the body. Hypertension forces the heart to work harder to pump blood and may cause arteries to become narrow or stiff. Having untreated or uncontrolled hypertension can cause heart attacks, strokes, kidney disease, and other problems. A blood pressure reading consists of a higher number over a lower number. Ideally, your blood pressure should be below 120/80. The first ("top") number is called the systolic pressure. It is a measure of the pressure in your arteries as your heart beats. The second ("bottom") number is called the diastolic pressure. It is a measure of the pressure in your arteries as the heart relaxes. What are the causes? The cause of this condition is not known. What increases the risk? Some risk factors for high blood pressure are under your control. Others are not. Factors you can change  Smoking.  Having type 2 diabetes mellitus, high cholesterol, or both.  Not getting enough exercise or physical activity.  Being overweight.  Having too much fat, sugar, calories, or salt (sodium) in your diet.  Drinking too much alcohol. Factors that are difficult or impossible to change  Having chronic kidney disease.  Having a family history of high blood pressure.  Age. Risk increases with age.  Race. You may be at higher risk if you are African-American.  Gender. Men are at higher risk than women before age  4. After age 71, women are at higher risk than men.  Having obstructive sleep apnea.  Stress. What are the signs or symptoms? Extremely high blood pressure (hypertensive crisis) may cause:  Headache.  Anxiety.  Shortness of breath.  Nosebleed.  Nausea and vomiting.  Severe chest pain.  Jerky movements you cannot control (seizures). How is this diagnosed? This condition is diagnosed by measuring your blood pressure while you are seated, with your arm  resting on a surface. The cuff of the blood pressure monitor will be placed directly against the skin of your upper arm at the level of your heart. It should be measured at least twice using the same arm. Certain conditions can cause a difference in blood pressure between your right and left arms. Certain factors can cause blood pressure readings to be lower or higher than normal (elevated) for a short period of time:  When your blood pressure is higher when you are in a health care provider's office than when you are at home, this is called white coat hypertension. Most people with this condition do not need medicines.  When your blood pressure is higher at home than when you are in a health care provider's office, this is called masked hypertension. Most people with this condition may need medicines to control blood pressure. If you have a high blood pressure reading during one visit or you have normal blood pressure with other risk factors:  You may be asked to return on a different day to have your blood pressure checked again.  You may be asked to monitor your blood pressure at home for 1 week or longer. If you are diagnosed with hypertension, you may have other blood or imaging tests to help your health care provider understand your overall risk for other conditions. How is this treated? This condition is treated by making healthy lifestyle changes, such as eating healthy foods, exercising more, and reducing your alcohol intake.  Your health care provider may prescribe medicine if lifestyle changes are not enough to get your blood pressure under control, and if:  Your systolic blood pressure is above 130.  Your diastolic blood pressure is above 80. Your personal target blood pressure may vary depending on your medical conditions, your age, and other factors. Follow these instructions at home: Eating and drinking   Eat a diet that is high in fiber and potassium, and low in sodium, added sugar, and fat. An example eating plan is called the DASH (Dietary Approaches to Stop Hypertension) diet. To eat this way: ? Eat plenty of fresh fruits and vegetables. Try to fill half of your plate at each meal with fruits and vegetables. ? Eat whole grains, such as whole wheat pasta, brown rice, or whole grain bread. Fill about one quarter of your plate with whole grains. ? Eat or drink low-fat dairy products, such as skim milk or low-fat yogurt. ? Avoid fatty cuts of meat, processed or cured meats, and poultry with skin. Fill about one quarter of your plate with lean proteins, such as fish, chicken without skin, beans, eggs, and tofu. ? Avoid premade and processed foods. These tend to be higher in sodium, added sugar, and fat.  Reduce your daily sodium intake. Most people with hypertension should eat less than 1,500 mg of sodium a day.  Limit alcohol intake to no more than 1 drink a day for nonpregnant women and 2 drinks a day for men. One drink equals 12 oz of beer, 5 oz of wine, or 1 oz of hard liquor. Lifestyle   Work with your health care provider to maintain a healthy body weight or to lose weight. Ask what an ideal weight is for you.  Get at least 30 minutes of exercise that causes your heart to beat faster (aerobic exercise) most days of the week. Activities may include walking, swimming, or biking.  Include exercise to strengthen your muscles (resistance exercise), such as pilates or lifting weights,  as part of your  weekly exercise routine. Try to do these types of exercises for 30 minutes at least 3 days a week.  Do not use any products that contain nicotine or tobacco, such as cigarettes and e-cigarettes. If you need help quitting, ask your health care provider.  Monitor your blood pressure at home as told by your health care provider.  Keep all follow-up visits as told by your health care provider. This is important. Medicines  Take over-the-counter and prescription medicines only as told by your health care provider. Follow directions carefully. Blood pressure medicines must be taken as prescribed.  Do not skip doses of blood pressure medicine. Doing this puts you at risk for problems and can make the medicine less effective.  Ask your health care provider about side effects or reactions to medicines that you should watch for. Contact a health care provider if:  You think you are having a reaction to a medicine you are taking.  You have headaches that keep coming back (recurring).  You feel dizzy.  You have swelling in your ankles.  You have trouble with your vision. Get help right away if:  You develop a severe headache or confusion.  You have unusual weakness or numbness.  You feel faint.  You have severe pain in your chest or abdomen.  You vomit repeatedly.  You have trouble breathing. Summary  Hypertension is when the force of blood pumping through your arteries is too strong. If this condition is not controlled, it may put you at risk for serious complications.  Your personal target blood pressure may vary depending on your medical conditions, your age, and other factors. For most people, a normal blood pressure is less than 120/80.  Hypertension is treated with lifestyle changes, medicines, or a combination of both. Lifestyle changes include weight loss, eating a healthy, low-sodium diet, exercising more, and limiting alcohol. This information is not intended to replace  advice given to you by your health care provider. Make sure you discuss any questions you have with your health care provider. Document Released: 05/10/2005 Document Revised: 04/07/2016 Document Reviewed: 04/07/2016 Elsevier Interactive Patient Education  2019 [employer].

## 2018-10-08 NOTE — Progress Notes (Signed)
Pt's have discharge orders, pt's v/s/s. Pt voice no concerns at this time. Pt's wife has been update.

## 2018-10-08 NOTE — Discharge Summary (Signed)
Physician Discharge Summary  DEANGLEO PASSAGE [facility]:[id number] DOB: 29-Mar-1951 DOA: 10/04/2018  PCP: [name], LouisMarlou Sa, MD  Admit date: 10/04/2018 Discharge date: 10/08/2018  Admitted From: Home Disposition: Home  Recommendations for Outpatient Follow-up:  1. Follow up with PCP in 1-2 weeks 2. Follow-up with cardiology as scheduled 3. Patient was discontinued from his lisinopril due to his renal insufficiency and started on hydralazine 50 mg p.o. 3 times daily for systolic blood pressure goal of less than 150 per vascular surgery.  Home Health: Yes, PT/RN Equipment/Devices: None  Discharge Condition: Stable CODE STATUS: DNR Diet recommendation: Heart Healthy   History of present illness:  [name] Reffner Bennettis a 68 y.o.malewho has a PMH as outlined below including but not limited to CAD with MI s/p CABG, infrarenal AAA (followed at [facility]) abdominal hernias not amenable to surgery, HTN, HLD, CHF, COPD (see "past medical history"for rest).[facility] with severe abdominal pain that began earlier that day. Pain located near his umbilical hernia which is chronic; however, it has not been amenable to surgery due to close proximity to known infrarenal AAA (was initially scheduled for surgery in 2013 but was then deferred to concerns over aortic neck and he was referred to [facility] where he was last seen in 2018). Pt apparently refused surgery roughly 7-8 years ago despite being told that aneurysm would eventually rupture.  In ED, he was found to have hypertensive emergency with presenting BP of 255/113. He was started on cleviprex. CT of the abdomen demonstrated 8cm infrarenal AAA. Vascular surgery was consulted and medical management was recommended.   Hospital course:  Hypertensive emergency Patient initially presenting to ED with severe abdominal pain.  Blood pressure admission noted to be 255/113.  Due to his history of AAA, patient was started on Cleviprex and admitted  to the critical care service.  Patient restarted on his home Coreg 37.5 mg qAM, 25 mg qPM, clonidine 0.2 mg BID, and Lasix 40mg  daily.  His home lisinopril was discontinued due to his renal insufficiency.  Patient was started on hydralazine and titrated up to 50 mg p.o. 3 times daily as he was able to be titrated off of IV Cleviprex.  Patient's blood pressure remained stable with vascular surgery's recommendations of SBP goal of less than 150.  Patient to follow-up with cardiology and his PCP for further monitoring of his blood pressure.  Abdominal aortic aneurysm Patient presenting with abdominal pain and noted to have a large abdominal aortic aneurysm measuring approximately 8 cm on CT.  Patient has known about this issue in the past and has refused surgery.  He was evaluated by vascular surgery during this hospitalization, and patient would likely need an aortobifemoral bypass and likely would require dialysis.  Aneurysm does not appear ruptured by CT and his hemoglobin remained stable.  Patient elected not to have any surgery at this time.  Vascular surgery recommends BP control with SBP goal less than 150.  Once controlled on oral medications can discharge home.  Patient appears to understand outcome of large on effects aneurysm per vascular surgery  CKD stage III Creatinine baseline over the past year seems to range from 1.24 to 2.21.  Creatinine on admission 2.11, likely towards higher baseline limit.  Etiology of his underlying renal dysfunction likely from poorly controlled hypertension.  Patient's home lisinopril was discontinued as his creatinine clearance at time of discharge was 30.5.  Chronic pain syndrome Resume home regimen to include tramadol 50 mg every 6 hours as needed.  Chronic diastolic congestive heart failure Appears compensated.  Continue home Lasix  HLD: Continue statin  Discharge Diagnoses:  Active Problems:   Abdominal aortic aneurysm (AAA) greater than 5.5 cm in  diameter in male [name]    Discharge Instructions  Discharge Instructions    Call MD for:  difficulty breathing, headache or visual disturbances   Complete by:  As directed    Call MD for:  persistant dizziness or light-headedness   Complete by:  As directed    Call MD for:  persistant nausea and vomiting   Complete by:  As directed    Call MD for:  severe uncontrolled pain   Complete by:  As directed    Call MD for:  temperature >100.4   Complete by:  As directed    Diet - low sodium heart healthy   Complete by:  As directed    Increase activity slowly   Complete by:  As directed      Allergies as of 10/08/2018      Reactions   Hctz [hydrochlorothiazide] Other (See Comments)   Blacked out due to electrolytes   Heparin Other (See Comments)   HIT positive as of 12/31/14   Norvasc [amlodipine Besylate] Swelling   Site of swelling not recalled   Pletal [cilostazol] Diarrhea      Medication List    STOP taking these medications   lisinopril 20 MG tablet Commonly known as:  ZESTRIL     TAKE these medications   carvedilol 25 MG tablet Commonly known as:  COREG TAKE 1 AND 1/2 TABLETS BY MOUTH EVERY MORNING AND TAKE 1 TABLET EVERY AFTERNOON What changed:    how much to take  how to take this  when to take this  additional instructions   cetirizine 10 MG tablet Commonly known as:  ZYRTEC Take 10 mg by mouth daily.   clobetasol ointment 0.05 % Commonly known as:  TEMOVATE Apply 1 application topically See admin instructions. Apply to both legs every other day   cloNIDine 0.2 MG tablet Commonly known as:  CATAPRES TAKE 1 TABLET BY MOUTH TWICE DAILY   ferrous sulfate 325 (65 FE) MG tablet Take 1 tablet (325 mg total) by mouth daily with breakfast for 30 days.   folic acid 1 MG tablet Commonly known as:  FOLVITE Take 1 tablet (1 mg total) by mouth daily. What changed:  when to take this   furosemide 40 MG tablet Commonly known as:  LASIX Take 40 mg by mouth  daily.   hydrALAZINE 50 MG tablet Commonly known as:  APRESOLINE Take 1 tablet (50 mg total) by mouth every 8 (eight) hours for 30 days.   nitroGLYCERIN 0.4 MG SL tablet Commonly known as:  NITROSTAT Place 0.4 mg under the tongue every 5 (five) minutes as needed for chest pain.   PEPCID AC PO Take 1 tablet by mouth at bedtime.   rosuvastatin 20 MG tablet Commonly known as:  CRESTOR TAKE 1 TABLET BY MOUTH AT BEDTIME   thiamine 100 MG tablet Commonly known as:  VITAMIN B-1 Take 100 mg by mouth daily.   traMADol 50 MG tablet Commonly known as:  Ultram Take 1 tablet (50 mg total) by mouth every 6 (six) hours as needed. What changed:  reasons to take this   triamcinolone ointment 0.1 % Commonly known as:  KENALOG Apply 1 application topically 2 (two) times daily. What changed:    when to take this  additional instructions   vitamin C  100 MG tablet Take 100 mg by mouth daily.      Follow-up Information    [facility] Follow [facility].   Specialty:  [place] Why:  For home health services, they will contact  you in 1-2 days       Mitchell, LouisMarlou Sa, MD. Schedule an appointment as soon as possible for a visit in 1 week(s).   Specialty:  Family Medicine Contact information: [address] [place] [address] [phone]          Allergies  Allergen Reactions  . Hctz [Hydrochlorothiazide] Other (See Comments)    Blacked out due to electrolytes  . Heparin Other (See Comments)    HIT positive as of 12/31/14  . Norvasc [Amlodipine Besylate] Swelling    Site of swelling not recalled  . Pletal [Cilostazol] Diarrhea    Consultations:  Pulmonary/critical care medicine  Vascular surgery   Procedures/Studies: Ct Abdomen Pelvis Wo Contrast  Result Date: 10/04/2018 CLINICAL DATA:  Hernia. EXAM: CT ABDOMEN AND PELVIS WITHOUT CONTRAST TECHNIQUE: Multidetector CT imaging of the abdomen and pelvis was performed following  the standard protocol without IV contrast. COMPARISON:  CT abdomen pelvis dated April 14, 2012. FINDINGS: Lower chest: No acute abnormality. Hepatobiliary: No focal liver abnormality. Tiny gallstone. No gallbladder wall thickening or biliary dilatation. Pancreas: Unremarkable. No pancreatic ductal dilatation or surrounding inflammatory changes. Spleen: Normal in size without focal abnormality. Adrenals/Urinary Tract: The adrenal glands are unremarkable. Bilateral renal atrophy with perinephric fat stranding. 1.6 cm right renal simple cyst. No renal calculi or hydronephrosis. The bladder is unremarkable for the degree of distention. Stomach/Bowel: Stomach is within normal limits. Appendix appears normal. No evidence of bowel wall thickening, distention, or inflammatory changes. Vascular/Lymphatic: Interval enlargement of the infrarenal abdominal aortic aneurysm, now measuring 8.0 cm, previously 4.9 cm. Aortoiliac atherosclerosis. No enlarged abdominal or pelvic lymph nodes. Reproductive: Prostate is unremarkable. Other: Moderate fat containing umbilical hernia. Small fat containing right inguinal hernia. No free fluid or pneumoperitoneum. Musculoskeletal: No acute or significant osseous findings. IMPRESSION: 1. Moderate fat containing umbilical hernia. Small fat containing right inguinal hernia. No inflammatory changes within the hernias. 2. 8.0 cm infrarenal abdominal aortic aneurysm. Vascular surgery consultation recommended due to increased risk of rupture for AAA >5.5 cm. This recommendation follows ACR consensus guidelines: White Paper of the ACR Incidental Findings Committee II on Vascular Findings. J Am Coll Radiol 2013; 10:789-794. Aortic aneurysm NOS (ICD10-I71.9) 3.  Aortic atherosclerosis (ICD10-I70.0). 4. Cholelithiasis. Electronically Signed   By: Titus Dubin M.D.   On: 10/04/2018 18:40       Subjective: Patient seen and examined at bedside, eating breakfast.  Reports good sleep overnight.   Blood pressure now well controlled on current oral regimen.  Ready for discharge home.  No other complaints at this time.  Denies headache, no fever/chills/night sweats, no chest pain, no palpitations, no shortness of breath, no cough/congestion, no nausea/vomiting/diarrhea, no paresthesias.  No acute events overnight per nursing staff.   Discharge Exam: Vitals:   10/08/18 0600 10/08/18 0744  BP: (!) 134/47   Pulse: (!) 57   Resp: 11   Temp:  98.4 F (36.9 C)  SpO2: 93%    Vitals:   10/08/18 0405 10/08/18 0500 10/08/18 0600 10/08/18 0744  BP:  (!) 133/58 (!) 134/47   Pulse:  60 (!) 57   Resp:  19 11   Temp: 98.5 F (36.9 C)   98.4 F (36.9 C)  TempSrc: Oral  Oral  SpO2:  93% 93%   Weight:  88.3 kg    Height:        General: Pt is alert, awake, not in acute distress, obese Cardiovascular: RRR, S1/S2 +, no rubs, no gallops Respiratory: CTA bilaterally, no wheezing, no rhonchi Abdominal: Soft, NT, ND, bowel sounds + Extremities: no edema, no cyanosis    The results of significant diagnostics from this hospitalization (including imaging, microbiology, ancillary and laboratory) are listed below for reference.     Microbiology: Recent Results (from the past 240 hour(s))  SARS Coronavirus 2 (CEPHEID - Performed in [facility] hospital lab), Hosp Order     Status: None   Collection Time: 10/04/18  8:14 PM  Result Value Ref Range Status   SARS Coronavirus 2 NEGATIVE NEGATIVE Final    Comment: (NOTE) If result is NEGATIVE SARS-CoV-2 target nucleic acids are NOT DETECTED. The SARS-CoV-2 RNA is generally detectable in upper and lower  respiratory specimens during the acute phase of infection. The lowest  concentration of SARS-CoV-2 viral copies this assay can detect is 250  copies / mL. A negative result does not preclude SARS-CoV-2 infection  and should not be used as the sole basis for treatment or other  patient management decisions.  A negative result may occur with   improper specimen collection / handling, submission of specimen other  than nasopharyngeal swab, presence of viral mutation(s) within the  areas targeted by this assay, and inadequate number of viral copies  (<250 copies / mL). A negative result must be combined with clinical  observations, patient history, and epidemiological information. If result is POSITIVE SARS-CoV-2 target nucleic acids are DETECTED. The SARS-CoV-2 RNA is generally detectable in upper and lower  respiratory specimens dur ing the acute phase of infection.  Positive  results are indicative of active infection with SARS-CoV-2.  Clinical  correlation with patient history and other diagnostic information is  necessary to determine patient infection status.  Positive results do  not rule out bacterial infection or co-infection with other viruses. If result is PRESUMPTIVE POSTIVE SARS-CoV-2 nucleic acids MAY BE PRESENT.   A presumptive positive result was obtained on the submitted specimen  and confirmed on repeat testing.  While 2019 novel coronavirus  (SARS-CoV-2) nucleic acids may be present in the submitted sample  additional confirmatory testing may be necessary for epidemiological  and / or clinical management purposes  to differentiate between  SARS-CoV-2 and other Sarbecovirus currently known to infect humans.  If clinically indicated additional testing with an alternate test  methodology [phone]) is advised. The SARS-CoV-2 RNA is generally  detectable in upper and lower respiratory sp ecimens during the acute  phase of infection. The expected result is Negative. Fact Sheet for Patients:  StrictlyIdeas.no Fact Sheet for Healthcare Providers: BankingDealers.co.za This test is not yet approved or cleared by the [place] FDA and has been authorized for detection and/or diagnosis of SARS-CoV-2 by FDA under an Emergency Use Authorization (EUA).  This EUA will  remain in effect (meaning this test can be used) for the duration of the COVID-19 declaration under Section 564(b)(1) of the Act, 21 U.S.C. section 360bbb-3(b)(1), unless the authorization is terminated or revoked sooner. Performed at [facility], [place] [address], [place], [place] [address]   MRSA PCR Screening     Status: None   Collection Time: 10/05/18  1:34 AM  Result Value Ref Range Status   MRSA by PCR NEGATIVE NEGATIVE Final    Comment:  The GeneXpert MRSA Assay (FDA approved for NASAL specimens only), is one component of a comprehensive MRSA colonization surveillance program. It is not intended to diagnose MRSA infection nor to guide or monitor treatment for MRSA infections. Performed at [facility], [place] [address] [place], [place] [address]      Labs: BNP (last 3 results) No results for input(s): BNP in the last 8760 hours. Basic Metabolic Panel: Recent Labs  Lab 10/04/18 1745 10/04/18 1750 10/05/18 0531 10/06/18 0445 10/07/18 0321  NA 134*  --  136 132* 132*  K 3.8  --  4.1 3.6 3.8  CL 97*  --  99 99 100  CO2 22  --  23 23 22   GLUCOSE 132*  --  121* 119* 121*  BUN 45*  --  45* 46* 56*  CREATININE 2.11* 2.00* 2.18* 2.19* 2.52*  CALCIUM 9.6  --  9.0 8.5* 8.5*  MG  --   --  1.7 2.0  --   PHOS  --   --  3.8 3.8  --    Liver Function Tests: Recent Labs  Lab 10/04/18 1745  AST 19  ALT 15  ALKPHOS 57  BILITOT 2.0*  PROT 7.0  ALBUMIN 4.1   Recent Labs  Lab 10/04/18 1745  LIPASE 38   No results for input(s): AMMONIA in the last 168 hours. CBC: Recent Labs  Lab 10/04/18 1745 10/05/18 0531  WBC 17.9* 15.0*  HGB 10.6* 10.1*  HCT 30.4* 29.6*  MCV 97.7 97.7  PLT 165 138*   Cardiac Enzymes: No results for input(s): CKTOTAL, CKMB, CKMBINDEX, TROPONINI in the last 168 hours. BNP: Invalid input(s): POCBNP CBG: Recent Labs  Lab 10/04/18 2238  GLUCAP 107*   D-Dimer No results for input(s): DDIMER in the last 72  hours. Hgb A1c No results for input(s): HGBA1C in the last 72 hours. Lipid Profile No results for input(s): CHOL, HDL, LDLCALC, TRIG, CHOLHDL, LDLDIRECT in the last 72 hours. Thyroid function studies No results for input(s): TSH, T4TOTAL, T3FREE, THYROIDAB in the last 72 hours.  Invalid input(s): FREET3 Anemia work up No results for input(s): VITAMINB12, FOLATE, FERRITIN, TIBC, IRON, RETICCTPCT in the last 72 hours. Urinalysis    Component Value Date/Time   COLORURINE YELLOW 10/04/2018 1711   APPEARANCEUR CLEAR 10/04/2018 1711   LABSPEC 1.010 10/04/2018 1711   PHURINE 6.5 10/04/2018 1711   GLUCOSEU NEGATIVE 10/04/2018 1711   HGBUR MODERATE (A) 10/04/2018 1711   BILIRUBINUR NEGATIVE 10/04/2018 1711   KETONESUR NEGATIVE 10/04/2018 1711   PROTEINUR 100 (A) 10/04/2018 1711   UROBILINOGEN >8.0 (H) 01/02/2015 1321   NITRITE NEGATIVE 10/04/2018 1711   LEUKOCYTESUR NEGATIVE 10/04/2018 1711   Sepsis Labs Invalid input(s): PROCALCITONIN,  WBC,  LACTICIDVEN Microbiology Recent Results (from the past 240 hour(s))  SARS Coronavirus 2 (CEPHEID - Performed in [facility] hospital lab), Hosp Order     Status: None   Collection Time: 10/04/18  8:14 PM  Result Value Ref Range Status   SARS Coronavirus 2 NEGATIVE NEGATIVE Final    Comment: (NOTE) If result is NEGATIVE SARS-CoV-2 target nucleic acids are NOT DETECTED. The SARS-CoV-2 RNA is generally detectable in upper and lower  respiratory specimens during the acute phase of infection. The lowest  concentration of SARS-CoV-2 viral copies this assay can detect is 250  copies / mL. A negative result does not preclude SARS-CoV-2 infection  and should not be used as the sole basis for treatment or other  patient management decisions.  A  negative result may occur with  improper specimen collection / handling, submission of specimen other  than nasopharyngeal swab, presence of viral mutation(s) within the  areas targeted by this assay, and  inadequate number of viral copies  (<250 copies / mL). A negative result must be combined with clinical  observations, patient history, and epidemiological information. If result is POSITIVE SARS-CoV-2 target nucleic acids are DETECTED. The SARS-CoV-2 RNA is generally detectable in upper and lower  respiratory specimens dur ing the acute phase of infection.  Positive  results are indicative of active infection with SARS-CoV-2.  Clinical  correlation with patient history and other diagnostic information is  necessary to determine patient infection status.  Positive results do  not rule out bacterial infection or co-infection with other viruses. If result is PRESUMPTIVE POSTIVE SARS-CoV-2 nucleic acids MAY BE PRESENT.   A presumptive positive result was obtained on the submitted specimen  and confirmed on repeat testing.  While 2019 novel coronavirus  (SARS-CoV-2) nucleic acids may be present in the submitted sample  additional confirmatory testing may be necessary for epidemiological  and / or clinical management purposes  to differentiate between  SARS-CoV-2 and other Sarbecovirus currently known to infect humans.  If clinically indicated additional testing with an alternate test  methodology [phone]) is advised. The SARS-CoV-2 RNA is generally  detectable in upper and lower respiratory sp ecimens during the acute  phase of infection. The expected result is Negative. Fact Sheet for Patients:  StrictlyIdeas.no Fact Sheet for Healthcare Providers: BankingDealers.co.za This test is not yet approved or cleared by the [place] FDA and has been authorized for detection and/or diagnosis of SARS-CoV-2 by FDA under an Emergency Use Authorization (EUA).  This EUA will remain in effect (meaning this test can be used) for the duration of the COVID-19 declaration under Section 564(b)(1) of the Act, 21 U.S.C. section 360bbb-3(b)(1), unless the  authorization is terminated or revoked sooner. Performed at [facility], [place] [address], [place], [place] [address]   MRSA PCR Screening     Status: None   Collection Time: 10/05/18  1:34 AM  Result Value Ref Range Status   MRSA by PCR NEGATIVE NEGATIVE Final    Comment:        The GeneXpert MRSA Assay (FDA approved for NASAL specimens only), is one component of a comprehensive MRSA colonization surveillance program. It is not intended to diagnose MRSA infection nor to guide or monitor treatment for MRSA infections. Performed at [facility], [place] [address] [place], [place] [address]      Time coordinating discharge: Over 30 minutes  SIGNED:   Adonna Horsley J [place] DO  [facility] 10/08/2018, 9:06 AM

## 2018-10-09 ENCOUNTER — Emergency Department (HOSPITAL_COMMUNITY)
Admission: EM | Admit: 2018-10-09 | Discharge: 2018-10-09 | Disposition: A | Discharge status: Home / Self Care | Payer: Medicare Other | Attending: Emergency Medicine | Admitting: Emergency Medicine

## 2018-10-09 ENCOUNTER — Telehealth: Payer: Self-pay | Admitting: Nurse Practitioner

## 2018-10-09 ENCOUNTER — Other Ambulatory Visit: Payer: Self-pay

## 2018-10-09 ENCOUNTER — Encounter (HOSPITAL_COMMUNITY): Payer: Self-pay

## 2018-10-09 DIAGNOSIS — I952 Hypotension due to drugs: Secondary | ICD-10-CM | POA: Diagnosis not present

## 2018-10-09 DIAGNOSIS — Z79899 Other long term (current) drug therapy: Secondary | ICD-10-CM | POA: Diagnosis not present

## 2018-10-09 DIAGNOSIS — Z87891 Personal history of nicotine dependence: Secondary | ICD-10-CM | POA: Diagnosis not present

## 2018-10-09 DIAGNOSIS — I959 Hypotension, unspecified: Secondary | ICD-10-CM | POA: Diagnosis not present

## 2018-10-09 DIAGNOSIS — I110 Hypertensive heart disease with heart failure: Secondary | ICD-10-CM | POA: Diagnosis not present

## 2018-10-09 DIAGNOSIS — Z951 Presence of aortocoronary bypass graft: Secondary | ICD-10-CM | POA: Insufficient documentation

## 2018-10-09 DIAGNOSIS — I509 Heart failure, unspecified: Secondary | ICD-10-CM | POA: Insufficient documentation

## 2018-10-09 DIAGNOSIS — I10 Essential (primary) hypertension: Secondary | ICD-10-CM | POA: Diagnosis not present

## 2018-10-09 DIAGNOSIS — R42 Dizziness and giddiness: Secondary | ICD-10-CM | POA: Diagnosis not present

## 2018-10-09 DIAGNOSIS — I2510 Atherosclerotic heart disease of native coronary artery without angina pectoris: Secondary | ICD-10-CM | POA: Diagnosis not present

## 2018-10-09 DIAGNOSIS — J449 Chronic obstructive pulmonary disease, unspecified: Secondary | ICD-10-CM | POA: Insufficient documentation

## 2018-10-09 DIAGNOSIS — R110 Nausea: Secondary | ICD-10-CM | POA: Diagnosis not present

## 2018-10-09 MED ORDER — HYDRALAZINE HCL 25 MG PO TABS: 25.0000 mg | ORAL_TABLET | Freq: Three times a day (TID) | ORAL | 0 refills | Status: DC

## 2018-10-09 NOTE — Telephone Encounter (Signed)
Called patient to get more information about the symptoms he was experiencing. Patient's wife explained that he had just gotten out of the hospital yesterday after being admitted with high blood pressure. Patient's wife reported that he was started on a hydralazine and iron while he was in the hospital. Patient began feeling tired and dizzy this morning. Blood pressure this morning was 76/50 with a pulse of 65. Will send to the preop pool for recommendations.

## 2018-10-09 NOTE — ED Provider Notes (Signed)
[facility] EMERGENCY DEPARTMENT Provider Note   CSN: [id number] Arrival date & time: 10/09/18  1312    History   Chief Complaint Chief Complaint  Patient presents with  . Hypotension    HPI [name] is a 68 y.o. male.     HPI   68yM with dizziness. Onset today. Now improved. Describes feeling of lightheadedness. Hypotensive prior to arrival. Hx of large AAA. Pt electing not to have intervention. BP meds recently adjusted to obtain tighter control. He denies any acute abdominal or back pain. No fever. No dyspnea.   Past Medical History:  Diagnosis Date  . AAA (abdominal aortic aneurysm) ([place])   . Anxiety   . CHF (congestive heart failure) ([place])   . COPD (chronic obstructive pulmonary disease) ([place])   . Coronary artery disease   . GERD (gastroesophageal reflux disease)   . History of blood transfusion   . Hyperlipidemia   . Hypertension    takes meds daily  . Hyponatremia   . Lumbar degenerative disc disease   . Myocardial infarction ([place])   . PAD (peripheral artery disease) ([place])   . PONV (postoperative nausea and vomiting)   . Raynaud's disease /phenomenon   . Stroke [facility]) 10/15/2005    Patient Active Problem List   Diagnosis Date Noted  . Umbilical hernia without obstruction and without gangrene   . Epistaxis 02/25/2017  . Urticaria with associated angioedema 08/05/2015  . Angioedema 07/22/2015  . Encounter for therapeutic drug monitoring 01/08/2015  . Acute pulmonary embolism ([place]) 01/03/2015  . Acute DVT (deep venous thrombosis) ([place]) 12/27/2014  . S/P CABG x 5 12/13/14 12/27/2014  . Anemia 12/27/2014  . Acute renal failure ([place]) 12/27/2014  . AKI (acute kidney injury) ([place])   . Absolute anemia   . CAD (coronary artery disease) 12/13/2014  . NSTEMI (non-ST elevated myocardial infarction) ([place]) 12/09/2014  . Hyponatremia   . Unstable angina pectoris ([place]) 12/07/2014  . Thoracic aneurysm without mention of rupture 04/14/2012  .  Intermittent claudication ([place]) 03/10/2012  . Atherosclerosis of native arteries of extremity with intermittent claudication ([place]) 10/08/2011  . Abdominal aortic aneurysm (AAA) greater than 5.5 cm in diameter in male ([place]) 09/07/2011  . Carotid stenosis, bilateral 09/07/2011  . Atherosclerosis of native coronary artery with unstable angina pectoris ([place]) 01/03/2011  . Essential hypertension, benign 01/03/2011  . Hyperlipidemia with target LDL less than 70 01/03/2011  . PAD (peripheral artery disease) ([place]) 01/03/2011    Past Surgical History:  Procedure Laterality Date  . ABDOMINAL ANGIOGRAM  03/30/2012  . ABDOMINAL AORTAGRAM N/A 03/30/2012   Procedure: ABDOMINAL Maxcine Ham;  Surgeon: Conrad Niles, MD;  Location[facility] CATH LAB;  Service: Cardiovascular;  Laterality: N/A;  . CARDIAC CATHETERIZATION  2006  . CARDIAC CATHETERIZATION N/A 12/09/2014   Procedure: Left Heart Cath and Coronary Angiography;  Surgeon: [name], MD;  Location: [facility] CV LAB;  Service: Cardiovascular;  Laterality: N/A;  . CAROTID ENDARTERECTOMY     bilateral  . CAROTID ENDARTERECTOMY  04/13/2005   Right and 06/03/2005 Left  . CORONARY ARTERY BYPASS GRAFT N/A 12/13/2014   Procedure: CORONARY ARTERY BYPASS GRAFT times five using left internal mammary artery and endoscopic right leg saphenous vein harvest;  Surgeon: Gaye Pollack, MD;  Location: [facility] OR;  Service: Open Heart Surgery;  Laterality: N/A;  . HERNIA REPAIR    . ILIAC ARTERY STENT  10/15/2005    Left common and external   . INTRAOPERATIVE TRANSESOPHAGEAL ECHOCARDIOGRAM N/A 12/13/2014  Procedure: INTRAOPERATIVE TRANSESOPHAGEAL ECHOCARDIOGRAM;  Surgeon: Gaye Pollack, MD;  Location: [facility] OR;  Service: Open Heart Surgery;  Laterality: N/A;        Home Medications    Prior to Admission medications   Medication Sig Start Date End Date Taking? Authorizing Provider  Ascorbic Acid (VITAMIN C) 100 MG tablet Take 100 mg by mouth daily.    [provider]  carvedilol (COREG) 25 MG tablet TAKE 1 AND 1/2 TABLETS BY MOUTH EVERY MORNING AND TAKE 1 TABLET EVERY AFTERNOON Patient taking differently: Take 25-37.5 mg by mouth See admin instructions. Take 37.5 mg by mouth in the morning and 25 mg in the afternoon 05/29/18   Burtis Junes, NP  cetirizine (ZYRTEC) 10 MG tablet Take 10 mg by mouth daily.    [provider]  clobetasol ointment (TEMOVATE) 7.47 % Apply 1 application topically See admin instructions. Apply to both legs every other day 09/24/15   [provider]  cloNIDine (CATAPRES) 0.2 MG tablet TAKE 1 TABLET BY MOUTH TWICE DAILY Patient taking differently: Take 0.2 mg by mouth 2 (two) times daily.  03/27/18   Burtis Junes, NP  Famotidine (PEPCID AC PO) Take 1 tablet by mouth at bedtime.    [provider]  ferrous sulfate 325 (65 FE) MG tablet Take 1 tablet (325 mg total) by mouth daily with breakfast for 30 days. 10/08/18 11/07/18  [place] Donnamarie Poag, DO  folic acid (FOLVITE) 1 MG tablet Take 1 tablet (1 mg total) by mouth daily. Patient taking differently: Take 1 mg by mouth at bedtime.  01/29/15   Burtis Junes, NP  furosemide (LASIX) 40 MG tablet Take 40 mg by mouth daily.    [provider]  hydrALAZINE (APRESOLINE) 50 MG tablet Take 1 tablet (50 mg total) by mouth every 8 (eight) hours for 30 days. 10/08/18 11/07/18  [place] Donnamarie Poag, DO  nitroGLYCERIN (NITROSTAT) 0.4 MG SL tablet Place 0.4 mg under the tongue every 5 (five) minutes as needed for chest pain.    [provider]  rosuvastatin (CRESTOR) 20 MG tablet TAKE 1 TABLET BY MOUTH AT BEDTIME Patient taking differently: Take 20 mg by mouth at bedtime.  08/01/18   Burtis Junes, NP  Thiamine HCl (VITAMIN B-1) 100 MG tablet Take 100 mg by mouth daily.      [provider]  traMADol (ULTRAM) 50 MG tablet Take 1 tablet (50 mg total) by mouth every 6 (six) hours as needed. Patient taking differently: Take 50 mg by mouth every  6 (six) hours as needed (for pain).  01/13/15   Burtis Junes, NP  triamcinolone ointment (KENALOG) 0.1 % Apply 1 application topically 2 (two) times daily. Patient taking differently: Apply 1 application topically See admin instructions. Apply to both legs every other day 08/05/15   Bobbitt, [place] Muta, MD    Family History Family History  Problem Relation Age of Onset  . Coronary artery disease Father 85       HTN, Hyperlipidemia died  . Heart disease Father        Heart Disease before age 30  . Hypertension Father   . Hyperlipidemia Father   . Peripheral vascular disease Father   . Other Mother 80        died rheumatoid arthritis  . Dementia Mother   . Heart disease Brother   . Hypertension Brother   . Hyperlipidemia Brother     Social History Social History   Tobacco  Use  . Smoking status: Former Smoker    Types: Cigarettes    Last attempt to quit: 05/24/2005    Years since quitting: 13.3  . Smokeless tobacco: Never Used  Substance Use Topics  . Alcohol use: Yes    Alcohol/week: 24.0 standard drinks    Types: 24 Cans of beer per week    Comment: approximately 24 beer a week  . Drug use: No     Allergies   Hctz [hydrochlorothiazide]; Heparin; Norvasc [amlodipine besylate]; and Pletal [cilostazol]   Review of Systems Review of Systems  All systems reviewed and negative, other than as noted in HPI.  Physical Exam Updated Vital Signs BP 105/67   Pulse (!) 55   Temp 98.6 F (37 C) (Oral)   Resp (!) 8   Ht 5\' 10"  (1.778 m)   Wt 88.3 kg   SpO2 97%   BMI 27.93 kg/m   Physical Exam Vitals signs and nursing note reviewed.  Constitutional:      General: He is not in acute distress.    Appearance: He is well-developed.  HENT:     Head: Normocephalic and atraumatic.  Eyes:     General:        Right eye: No discharge.        Left eye: No discharge.     Conjunctiva/sclera: Conjunctivae normal.  Neck:     Musculoskeletal: Neck supple.  Cardiovascular:      Rate and Rhythm: Normal rate and regular rhythm.     Heart sounds: Normal heart sounds. No murmur. No friction rub. No gallop.   Pulmonary:     Effort: Pulmonary effort is normal. No respiratory distress.     Breath sounds: Normal breath sounds.  Abdominal:     General: There is no distension.     Palpations: Abdomen is soft.     Tenderness: There is no abdominal tenderness.  Musculoskeletal:        General: No tenderness.  Skin:    General: Skin is warm and dry.  Neurological:     Mental Status: He is alert.  Psychiatric:        Behavior: Behavior normal.        Thought Content: Thought content normal.      ED Treatments / Results  Labs (all labs ordered are listed, but only abnormal results are displayed) Labs Reviewed - No data to display  EKG EKG Interpretation  Date/Time:  Monday Oct 09 2018 13:17:04 EDT Ventricular Rate:  60 PR Interval:    QRS Duration: 106 QT Interval:  428 QTC Calculation: 428 R Axis:   69 Text Interpretation:  Sinus rhythm RSR' in V1 or V2, probably normal variant Probable inferior infarct, old Minimal ST elevation, anterior leads Confirmed by Virgel Manifold [phone]) on 10/09/2018 1:46:23 PM   Radiology No results found.  Procedures Procedures (including critical care time)  Medications Ordered in ED Medications - No data to display   Initial Impression / Assessment and Plan / ED Course  I have reviewed the triage vital signs and the nursing notes.  Pertinent labs & imaging results that were available during my care of the patient were reviewed by me and considered in my medical decision making (see chart for details).    68yM with hypotension. Large known AAA that he doesn't want intervention for. Understands risks. Plan for tight BP control. Likely overshooting now with recent changes. BP has been stable in the ED. He denies any acute abdominal or back  pain. Will have him hold hydralazine for the rest of the day and then resume  it tomorrow at lower dose of 25 mg TID. Needs to follow-up as recommended on discharge otherwise.   Final Clinical Impressions(s) / ED Diagnoses   Final diagnoses:  Hypotension due to drugs    ED Discharge Orders    None       Virgel Manifold, MD 10/10/18 [phone]

## 2018-10-09 NOTE — ED Notes (Signed)
Pt was moved into the "hall bed location" just temporarily to empty out the room so a Code Stemi could be moved in. Pt was D/C not long afterwards.

## 2018-10-09 NOTE — Telephone Encounter (Signed)
° ° °  Spouse calling back to report they were advised by [facility] to go to ED. They are going to ED now as instrcuted

## 2018-10-09 NOTE — ED Notes (Signed)
NURSE NAVIGATOR  Spoke to wife , she was concerned about his BP being so low esp with the aortic aneurism and sent hi to ER esp since they changed his meds before they sent him home yesterday, pt knows to call his wife with update

## 2018-10-09 NOTE — ED Triage Notes (Addendum)
Pt has been feeling dizzy, & his SBP has been in 110's  While in route to ED(per EMS), his BP meds were adjusted while he was in the ICU here recently d/t HTN putting himself at risk for rupture of his AAA. Pt was noted to be hypotensive this morning by his home health RN before coming to ED (SBP 78). 107/73 upon arrival to ED.

## 2018-10-09 NOTE — Telephone Encounter (Signed)
New message   STAT if patient feels like he/she is going to faint   1) Are you dizzy now? Yes   2) Do you feel faint or have you passed out?no   3) Do you have any other symptoms? Lightheaded, dizzy, tired   4) Have you checked your HR and BP (record if available)? 76/50 b/p hr 75

## 2018-10-09 NOTE — Discharge Instructions (Addendum)
Do not take hydralazine for the rest of the day. Restart it tomorrow at a lower dose of 25 mg every 8 hours. Keep your other medications the same. Follow-up as recommended otherwise when you were discharged.

## 2018-10-09 NOTE — ED Notes (Signed)
Pt was assisted in contacting his wife & then helped to dress, then transported via w/c into the lobby to wait for his wife to pick him up.

## 2018-10-10 ENCOUNTER — Ambulatory Visit: Payer: Medicare Other | Admitting: Nurse Practitioner

## 2018-10-10 NOTE — Telephone Encounter (Signed)
Noted  

## 2018-10-16 ENCOUNTER — Emergency Department (HOSPITAL_COMMUNITY): Payer: Medicare Other

## 2018-10-16 ENCOUNTER — Inpatient Hospital Stay (HOSPITAL_COMMUNITY)
Admission: EM | Admit: 2018-10-16 | Discharge: 2018-10-20 | DRG: 300 | Disposition: A | Discharge status: Hospice | Payer: Medicare Other | Attending: Vascular Surgery | Admitting: Vascular Surgery

## 2018-10-16 ENCOUNTER — Other Ambulatory Visit: Payer: Self-pay

## 2018-10-16 ENCOUNTER — Encounter (HOSPITAL_COMMUNITY): Payer: Self-pay | Admitting: Emergency Medicine

## 2018-10-16 DIAGNOSIS — Z515 Encounter for palliative care: Secondary | ICD-10-CM

## 2018-10-16 DIAGNOSIS — Z8249 Family history of ischemic heart disease and other diseases of the circulatory system: Secondary | ICD-10-CM

## 2018-10-16 DIAGNOSIS — I7300 Raynaud's syndrome without gangrene: Secondary | ICD-10-CM | POA: Diagnosis present

## 2018-10-16 DIAGNOSIS — Z20828 Contact with and (suspected) exposure to other viral communicable diseases: Secondary | ICD-10-CM | POA: Diagnosis present

## 2018-10-16 DIAGNOSIS — Z01818 Encounter for other preprocedural examination: Secondary | ICD-10-CM

## 2018-10-16 DIAGNOSIS — I130 Hypertensive heart and chronic kidney disease with heart failure and stage 1 through stage 4 chronic kidney disease, or unspecified chronic kidney disease: Secondary | ICD-10-CM | POA: Diagnosis present

## 2018-10-16 DIAGNOSIS — Z8349 Family history of other endocrine, nutritional and metabolic diseases: Secondary | ICD-10-CM | POA: Diagnosis not present

## 2018-10-16 DIAGNOSIS — Z8673 Personal history of transient ischemic attack (TIA), and cerebral infarction without residual deficits: Secondary | ICD-10-CM | POA: Diagnosis not present

## 2018-10-16 DIAGNOSIS — I714 Abdominal aortic aneurysm, without rupture, unspecified: Secondary | ICD-10-CM | POA: Diagnosis present

## 2018-10-16 DIAGNOSIS — K469 Unspecified abdominal hernia without obstruction or gangrene: Secondary | ICD-10-CM | POA: Diagnosis present

## 2018-10-16 DIAGNOSIS — I25709 Atherosclerosis of coronary artery bypass graft(s), unspecified, with unspecified angina pectoris: Secondary | ICD-10-CM | POA: Diagnosis not present

## 2018-10-16 DIAGNOSIS — Z87891 Personal history of nicotine dependence: Secondary | ICD-10-CM | POA: Diagnosis not present

## 2018-10-16 DIAGNOSIS — E785 Hyperlipidemia, unspecified: Secondary | ICD-10-CM | POA: Diagnosis present

## 2018-10-16 DIAGNOSIS — K5900 Constipation, unspecified: Secondary | ICD-10-CM | POA: Diagnosis present

## 2018-10-16 DIAGNOSIS — Z741 Need for assistance with personal care: Secondary | ICD-10-CM | POA: Diagnosis not present

## 2018-10-16 DIAGNOSIS — Z951 Presence of aortocoronary bypass graft: Secondary | ICD-10-CM | POA: Diagnosis not present

## 2018-10-16 DIAGNOSIS — J449 Chronic obstructive pulmonary disease, unspecified: Secondary | ICD-10-CM | POA: Diagnosis present

## 2018-10-16 DIAGNOSIS — Z888 Allergy status to other drugs, medicaments and biological substances status: Secondary | ICD-10-CM | POA: Diagnosis not present

## 2018-10-16 DIAGNOSIS — M545 Low back pain: Secondary | ICD-10-CM | POA: Diagnosis present

## 2018-10-16 DIAGNOSIS — I5022 Chronic systolic (congestive) heart failure: Secondary | ICD-10-CM | POA: Diagnosis present

## 2018-10-16 DIAGNOSIS — K219 Gastro-esophageal reflux disease without esophagitis: Secondary | ICD-10-CM | POA: Diagnosis present

## 2018-10-16 DIAGNOSIS — Z66 Do not resuscitate: Secondary | ICD-10-CM | POA: Diagnosis not present

## 2018-10-16 DIAGNOSIS — I509 Heart failure, unspecified: Secondary | ICD-10-CM | POA: Diagnosis not present

## 2018-10-16 DIAGNOSIS — Z7189 Other specified counseling: Secondary | ICD-10-CM

## 2018-10-16 DIAGNOSIS — R52 Pain, unspecified: Secondary | ICD-10-CM | POA: Diagnosis not present

## 2018-10-16 DIAGNOSIS — I2510 Atherosclerotic heart disease of native coronary artery without angina pectoris: Secondary | ICD-10-CM | POA: Diagnosis present

## 2018-10-16 DIAGNOSIS — G8929 Other chronic pain: Secondary | ICD-10-CM | POA: Diagnosis present

## 2018-10-16 DIAGNOSIS — N184 Chronic kidney disease, stage 4 (severe): Secondary | ICD-10-CM | POA: Diagnosis not present

## 2018-10-16 DIAGNOSIS — Z6825 Body mass index (BMI) 25.0-25.9, adult: Secondary | ICD-10-CM | POA: Diagnosis not present

## 2018-10-16 DIAGNOSIS — I252 Old myocardial infarction: Secondary | ICD-10-CM | POA: Diagnosis not present

## 2018-10-16 DIAGNOSIS — D631 Anemia in chronic kidney disease: Secondary | ICD-10-CM | POA: Diagnosis not present

## 2018-10-16 DIAGNOSIS — I255 Ischemic cardiomyopathy: Secondary | ICD-10-CM | POA: Diagnosis not present

## 2018-10-16 DIAGNOSIS — R109 Unspecified abdominal pain: Secondary | ICD-10-CM | POA: Diagnosis not present

## 2018-10-16 LAB — URINALYSIS, ROUTINE W REFLEX MICROSCOPIC
Bacteria, UA: NONE SEEN
Bilirubin Urine: NEGATIVE
Glucose, UA: NEGATIVE mg/dL
Hgb urine dipstick: NEGATIVE
Ketones, ur: NEGATIVE mg/dL
Leukocytes,Ua: NEGATIVE
Nitrite: NEGATIVE
Protein, ur: 100 mg/dL — AB
Specific Gravity, Urine: 1.014 (ref 1.005–1.030)
pH: 5 (ref 5.0–8.0)

## 2018-10-16 LAB — COMPREHENSIVE METABOLIC PANEL
ALT: 11 U/L (ref 0–44)
AST: 14 U/L — ABNORMAL LOW (ref 15–41)
Albumin: 3.2 g/dL — ABNORMAL LOW (ref 3.5–5.0)
Alkaline Phosphatase: 57 U/L (ref 38–126)
Anion gap: 12 (ref 5–15)
BUN: 68 mg/dL — ABNORMAL HIGH (ref 8–23)
CO2: 18 mmol/L — ABNORMAL LOW (ref 22–32)
Calcium: 9.3 mg/dL (ref 8.9–10.3)
Chloride: 106 mmol/L (ref 98–111)
Creatinine, Ser: 3.62 mg/dL — ABNORMAL HIGH (ref 0.61–1.24)
GFR calc Af Amer: 19 mL/min — ABNORMAL LOW (ref >60)
GFR calc non Af Amer: 16 mL/min — ABNORMAL LOW (ref >60)
Glucose, Bld: 109 mg/dL — ABNORMAL HIGH (ref 70–99)
Potassium: 4.3 mmol/L (ref 3.5–5.1)
Sodium: 136 mmol/L (ref 135–145)
Total Bilirubin: 1.5 mg/dL — ABNORMAL HIGH (ref 0.3–1.2)
Total Protein: 6.8 g/dL (ref 6.5–8.1)

## 2018-10-16 LAB — CBC
HCT: 27.7 % — ABNORMAL LOW (ref 39.0–52.0)
Hemoglobin: 9.3 g/dL — ABNORMAL LOW (ref 13.0–17.0)
MCH: 33.2 pg (ref 26.0–34.0)
MCHC: 33.6 g/dL (ref 30.0–36.0)
MCV: 98.9 fL (ref 80.0–100.0)
Platelets: 154 10*3/uL (ref 150–400)
RBC: 2.8 MIL/uL — ABNORMAL LOW (ref 4.22–5.81)
RDW: 13.1 % (ref 11.5–15.5)
WBC: 11.1 10*3/uL — ABNORMAL HIGH (ref 4.0–10.5)
nRBC: 0 % (ref 0.0–0.2)

## 2018-10-16 LAB — LIPASE, BLOOD: Lipase: 39 U/L (ref 11–51)

## 2018-10-16 MED ORDER — HYDROMORPHONE HCL 1 MG/ML IJ SOLN
0.5000 mg | Freq: Once | INTRAMUSCULAR | Status: AC
  Administered 2018-10-16: 0.5 mg via INTRAVENOUS
  Filled 2018-10-16: qty 1

## 2018-10-16 MED ORDER — GUAIFENESIN-DM 100-10 MG/5ML PO SYRP: 15.0000 mL | ORAL_SOLUTION | ORAL | Status: DC | PRN

## 2018-10-16 MED ORDER — TRAMADOL HCL 50 MG PO TABS
50.0000 mg | ORAL_TABLET | Freq: Two times a day (BID) | ORAL | Status: DC | PRN
  Administered 2018-10-17 (×2): 50 mg via ORAL
  Filled 2018-10-16 (×2): qty 1

## 2018-10-16 MED ORDER — ALUM & MAG HYDROXIDE-SIMETH 200-200-20 MG/5ML PO SUSP: 15.0000 mL | ORAL | Status: DC | PRN

## 2018-10-16 MED ORDER — SODIUM CHLORIDE 0.9% FLUSH
3.0000 mL | Freq: Once | INTRAVENOUS | Status: AC
  Administered 2018-10-17: 3 mL via INTRAVENOUS

## 2018-10-16 MED ORDER — CARVEDILOL 25 MG PO TABS
37.5000 mg | ORAL_TABLET | Freq: Every morning | ORAL | Status: DC
  Administered 2018-10-17 – 2018-10-20 (×4): 37.5 mg via ORAL
  Filled 2018-10-16 (×4): qty 1

## 2018-10-16 MED ORDER — CARVEDILOL 25 MG PO TABS
25.0000 mg | ORAL_TABLET | Freq: Every evening | ORAL | Status: DC
  Administered 2018-10-17 – 2018-10-19 (×3): 25 mg via ORAL
  Filled 2018-10-16 (×3): qty 1

## 2018-10-16 MED ORDER — DOCUSATE SODIUM 100 MG PO CAPS
100.0000 mg | ORAL_CAPSULE | Freq: Two times a day (BID) | ORAL | Status: DC
  Administered 2018-10-17 – 2018-10-20 (×7): 100 mg via ORAL
  Filled 2018-10-16 (×7): qty 1

## 2018-10-16 MED ORDER — HYDROMORPHONE HCL 1 MG/ML IJ SOLN
0.5000 mg | INTRAMUSCULAR | Status: DC | PRN
  Administered 2018-10-17 – 2018-10-18 (×8): 1 mg via INTRAVENOUS
  Filled 2018-10-16 (×9): qty 1

## 2018-10-16 MED ORDER — PANTOPRAZOLE SODIUM 40 MG PO TBEC
40.0000 mg | DELAYED_RELEASE_TABLET | Freq: Every day | ORAL | Status: DC
  Administered 2018-10-17 – 2018-10-20 (×4): 40 mg via ORAL
  Filled 2018-10-16 (×4): qty 1

## 2018-10-16 MED ORDER — FUROSEMIDE 40 MG PO TABS: 40.0000 mg | ORAL_TABLET | Freq: Every day | ORAL | Status: DC | PRN

## 2018-10-16 MED ORDER — ROSUVASTATIN CALCIUM 20 MG PO TABS
20.0000 mg | ORAL_TABLET | Freq: Every day | ORAL | Status: DC
  Administered 2018-10-17 – 2018-10-19 (×4): 20 mg via ORAL
  Filled 2018-10-16 (×4): qty 1

## 2018-10-16 MED ORDER — FAMOTIDINE 20 MG PO TABS
20.0000 mg | ORAL_TABLET | Freq: Every day | ORAL | Status: DC
  Administered 2018-10-17 – 2018-10-19 (×4): 20 mg via ORAL
  Filled 2018-10-16 (×4): qty 1

## 2018-10-16 MED ORDER — CLEVIDIPINE BUTYRATE 0.5 MG/ML IV EMUL
0.0000 mg/h | INTRAVENOUS | Status: DC
  Administered 2018-10-16: 22:00:00 1 mg/h via INTRAVENOUS
  Administered 2018-10-17 (×2): 18 mg/h via INTRAVENOUS
  Filled 2018-10-16: qty 100
  Filled 2018-10-16: qty 50
  Filled 2018-10-16: qty 100
  Filled 2018-10-16: qty 50
  Filled 2018-10-16 (×2): qty 100
  Filled 2018-10-16: qty 50

## 2018-10-16 MED ORDER — FLEET ENEMA 7-19 GM/118ML RE ENEM: 1.0000 | ENEMA | Freq: Once | RECTAL | Status: DC

## 2018-10-16 MED ORDER — HYDRALAZINE HCL 25 MG PO TABS
25.0000 mg | ORAL_TABLET | Freq: Three times a day (TID) | ORAL | Status: DC
  Administered 2018-10-17 – 2018-10-20 (×10): 25 mg via ORAL
  Filled 2018-10-16 (×10): qty 1

## 2018-10-16 MED ORDER — CLONIDINE HCL 0.2 MG PO TABS
0.2000 mg | ORAL_TABLET | Freq: Two times a day (BID) | ORAL | Status: DC
  Administered 2018-10-17 – 2018-10-20 (×7): 0.2 mg via ORAL
  Filled 2018-10-16 (×7): qty 1

## 2018-10-16 MED ORDER — BISACODYL 10 MG RE SUPP: 10.0000 mg | Freq: Every day | RECTAL | Status: DC | PRN

## 2018-10-16 MED ORDER — LIDOCAINE 4 % EX CREA
TOPICAL_CREAM | Freq: Two times a day (BID) | CUTANEOUS | Status: DC
  Administered 2018-10-17 – 2018-10-18 (×3): 1 via TOPICAL
  Filled 2018-10-16 (×2): qty 5

## 2018-10-16 MED ORDER — ONDANSETRON HCL 4 MG/2ML IJ SOLN
4.0000 mg | Freq: Four times a day (QID) | INTRAMUSCULAR | Status: DC | PRN
  Administered 2018-10-17: 4 mg via INTRAVENOUS
  Filled 2018-10-16: qty 2

## 2018-10-16 MED ORDER — HYDRALAZINE HCL 20 MG/ML IJ SOLN: 5.0000 mg | INTRAMUSCULAR | Status: DC | PRN

## 2018-10-16 MED ORDER — ONDANSETRON HCL 4 MG PO TABS: 8.0000 mg | ORAL_TABLET | Freq: Three times a day (TID) | ORAL | Status: DC | PRN

## 2018-10-16 MED ORDER — PHENOL 1.4 % MT LIQD: 1.0000 | OROMUCOSAL | Status: DC | PRN

## 2018-10-16 MED ORDER — POLYETHYLENE GLYCOL 3350 17 G PO PACK
17.0000 g | PACK | Freq: Every day | ORAL | Status: DC | PRN
  Administered 2018-10-17 – 2018-10-18 (×2): 17 g via ORAL
  Filled 2018-10-16 (×2): qty 1

## 2018-10-16 MED ORDER — FENTANYL 50 MCG/HR TD PT72
1.0000 | MEDICATED_PATCH | TRANSDERMAL | Status: DC
  Filled 2018-10-16: qty 1

## 2018-10-16 MED ORDER — LABETALOL HCL 5 MG/ML IV SOLN
10.0000 mg | INTRAVENOUS | Status: DC | PRN
  Administered 2018-10-17: 10 mg via INTRAVENOUS
  Filled 2018-10-16: qty 4

## 2018-10-16 MED ORDER — METOPROLOL TARTRATE 5 MG/5ML IV SOLN
2.0000 mg | INTRAVENOUS | Status: DC | PRN
  Filled 2018-10-16: qty 5

## 2018-10-16 MED ORDER — TRIAMCINOLONE ACETONIDE 0.1 % EX OINT
1.0000 "application " | TOPICAL_OINTMENT | CUTANEOUS | Status: DC
  Administered 2018-10-17: 1 via TOPICAL
  Filled 2018-10-16 (×3): qty 15

## 2018-10-16 MED ORDER — CLOBETASOL PROPIONATE 0.05 % EX OINT
1.0000 "application " | TOPICAL_OINTMENT | CUTANEOUS | Status: DC
  Administered 2018-10-17: 1 via TOPICAL
  Filled 2018-10-16 (×2): qty 15

## 2018-10-16 MED ORDER — POTASSIUM CHLORIDE CRYS ER 20 MEQ PO TBCR: 20.0000 meq | EXTENDED_RELEASE_TABLET | Freq: Once | ORAL | Status: DC

## 2018-10-16 MED ORDER — LORATADINE 10 MG PO TABS
10.0000 mg | ORAL_TABLET | Freq: Every day | ORAL | Status: DC
  Administered 2018-10-17 – 2018-10-20 (×4): 10 mg via ORAL
  Filled 2018-10-16 (×4): qty 1

## 2018-10-16 MED ORDER — NITROGLYCERIN 0.4 MG SL SUBL: 0.4000 mg | SUBLINGUAL_TABLET | SUBLINGUAL | Status: DC | PRN

## 2018-10-16 NOTE — ED Notes (Signed)
Pts wife would like to be called with any new info

## 2018-10-16 NOTE — ED Notes (Signed)
Vascular surgery at bedside.

## 2018-10-16 NOTE — ED Notes (Signed)
Pt's wife at bedside.

## 2018-10-16 NOTE — ED Notes (Signed)
Pt sat dropped to 90-92, pt placed on 1L [place]

## 2018-10-16 NOTE — ED Notes (Signed)
Pts wife Gae Bon [phone] updated, she is in the car waiting for information.

## 2018-10-16 NOTE — ED Notes (Addendum)
Pt's wife updated, given clearance to be at bedside for conversation with vascular surgery

## 2018-10-16 NOTE — ED Provider Notes (Signed)
[facility] EMERGENCY DEPARTMENT Provider Note   CSN: [id number] Arrival date & time: 10/16/18  1920    History   Chief Complaint Chief Complaint  Patient presents with   Abdominal Pain    HPI [name] is a 68 y.o. male.     68 yo M with a significant past medical history of a known AAA comes in with a chief complaint of abdominal pain.  Started this morning described as sharp and severe radiates to his back.  Has had some nausea and vomiting and some diarrhea with this.  States that the surgery was to risky and so he is not willing to have it done.  Pain is progressively gotten worse throughout the day.  The history is provided by the patient.  Abdominal Pain  Pain location:  Generalized Pain quality: sharp   Pain radiates to:  Back Pain severity:  Severe Onset quality:  Sudden Duration:  6 hours Timing:  Constant Progression:  Worsening Chronicity:  New Relieved by:  Nothing Worsened by:  Nothing Ineffective treatments:  None tried Associated symptoms: diarrhea, nausea and vomiting   Associated symptoms: no chest pain, no chills, no fever and no shortness of breath     Past Medical History:  Diagnosis Date   AAA (abdominal aortic aneurysm) (HCC)    Anxiety    CHF (congestive heart failure) (HCC)    COPD (chronic obstructive pulmonary disease) (HCC)    Coronary artery disease    GERD (gastroesophageal reflux disease)    History of blood transfusion    Hyperlipidemia    Hypertension    takes meds daily   Hyponatremia    Lumbar degenerative disc disease    Myocardial infarction (HCC)    PAD (peripheral artery disease) (HCC)    PONV (postoperative nausea and vomiting)    Raynaud's disease /phenomenon    Stroke ([place]) 10/15/2005    Patient Active Problem List   Diagnosis Date Noted   Umbilical hernia without obstruction and without gangrene    Epistaxis 02/25/2017   Urticaria with associated angioedema 08/05/2015     Angioedema 07/22/2015   Encounter for therapeutic drug monitoring 01/08/2015   Acute pulmonary embolism ([place]) 01/03/2015   Acute DVT (deep venous thrombosis) ([place]) 12/27/2014   S/P CABG x 5 12/13/14 12/27/2014   Anemia 12/27/2014   Acute renal failure ([place]) 12/27/2014   AKI (acute kidney injury) (HCC)    Absolute anemia    CAD (coronary artery disease) 12/13/2014   NSTEMI (non-ST elevated myocardial infarction) ([place]) 12/09/2014   Hyponatremia    Unstable angina pectoris ([place]) 12/07/2014   Thoracic aneurysm without mention of rupture 04/14/2012   Intermittent claudication ([place]) 03/10/2012   Atherosclerosis of native arteries of extremity with intermittent claudication ([place]) 10/08/2011   Abdominal aortic aneurysm (AAA) greater than 5.5 cm in diameter in male ([place]) 09/07/2011   Carotid stenosis, bilateral 09/07/2011   Atherosclerosis of native coronary artery with unstable angina pectoris ([place]) 01/03/2011   Essential hypertension, benign 01/03/2011   Hyperlipidemia with target LDL less than 70 01/03/2011   PAD (peripheral artery disease) ([place]) 01/03/2011    Past Surgical History:  Procedure Laterality Date   ABDOMINAL ANGIOGRAM  03/30/2012   ABDOMINAL AORTAGRAM N/A 03/30/2012   Procedure: ABDOMINAL Maxcine Ham;  Surgeon: Conrad Caroline, MD;  Location: [facility] CATH LAB;  Service: Cardiovascular;  Laterality: N/A;   CARDIAC CATHETERIZATION  2006   CARDIAC CATHETERIZATION N/A 12/09/2014   Procedure: Left Heart Cath and Coronary Angiography;  Surgeon: [name], MD;  Location: [facility] CV LAB;  Service: Cardiovascular;  Laterality: N/A;   CAROTID ENDARTERECTOMY     bilateral   CAROTID ENDARTERECTOMY  04/13/2005   Right and 06/03/2005 Left   CORONARY ARTERY BYPASS GRAFT N/A 12/13/2014   Procedure: CORONARY ARTERY BYPASS GRAFT times five using left internal mammary artery and endoscopic right leg saphenous vein harvest;  Surgeon: Gaye Pollack, MD;  Location: [facility]  OR;  Service: Open Heart Surgery;  Laterality: N/A;   HERNIA REPAIR     ILIAC ARTERY STENT  10/15/2005    Left common and external    INTRAOPERATIVE TRANSESOPHAGEAL ECHOCARDIOGRAM N/A 12/13/2014   Procedure: INTRAOPERATIVE TRANSESOPHAGEAL ECHOCARDIOGRAM;  Surgeon: Gaye Pollack, MD;  Location: [facility] OR;  Service: Open Heart Surgery;  Laterality: N/A;        Home Medications    Prior to Admission medications   Medication Sig Start Date End Date Taking? Authorizing Provider  Ascorbic Acid (VITAMIN C) 100 MG tablet Take 100 mg by mouth daily.    [provider]  carvedilol (COREG) 25 MG tablet TAKE 1 AND 1/2 TABLETS BY MOUTH EVERY MORNING AND TAKE 1 TABLET EVERY AFTERNOON Patient taking differently: Take 25-37.5 mg by mouth See admin instructions. Take 37.5 mg by mouth in the morning and 25 mg in the afternoon 05/29/18   Burtis Junes, NP  cetirizine (ZYRTEC) 10 MG tablet Take 10 mg by mouth daily.    [provider]  clobetasol ointment (TEMOVATE) 0.35 % Apply 1 application topically See admin instructions. Apply to both legs every other day 09/24/15   [provider]  cloNIDine (CATAPRES) 0.2 MG tablet TAKE 1 TABLET BY MOUTH TWICE DAILY Patient taking differently: Take 0.2 mg by mouth 2 (two) times daily.  03/27/18   Burtis Junes, NP  Famotidine (PEPCID AC PO) Take 1 tablet by mouth at bedtime.    [provider]  ferrous sulfate 325 (65 FE) MG tablet Take 1 tablet (325 mg total) by mouth daily with breakfast for 30 days. 10/08/18 11/07/18  [place] Donnamarie Poag, DO  folic acid (FOLVITE) 1 MG tablet Take 1 tablet (1 mg total) by mouth daily. Patient taking differently: Take 1 mg by mouth at bedtime.  01/29/15   Burtis Junes, NP  furosemide (LASIX) 40 MG tablet Take 40 mg by mouth daily.    [provider]  hydrALAZINE (APRESOLINE) 25 MG tablet Take 1 tablet (25 mg total) by mouth every 8 (eight) hours for 30 days. 10/09/18 11/08/18  Virgel Manifold, MD    nitroGLYCERIN (NITROSTAT) 0.4 MG SL tablet Place 0.4 mg under the tongue every 5 (five) minutes as needed for chest pain.    [provider]  rosuvastatin (CRESTOR) 20 MG tablet TAKE 1 TABLET BY MOUTH AT BEDTIME Patient taking differently: Take 20 mg by mouth at bedtime.  08/01/18   Burtis Junes, NP  Thiamine HCl (VITAMIN B-1) 100 MG tablet Take 100 mg by mouth daily.      [provider]  traMADol (ULTRAM) 50 MG tablet Take 1 tablet (50 mg total) by mouth every 6 (six) hours as needed. Patient taking differently: Take 50 mg by mouth every 6 (six) hours as needed (for pain).  01/13/15   Burtis Junes, NP  triamcinolone ointment (KENALOG) 0.1 % Apply 1 application topically 2 (two) times daily. Patient taking differently: Apply 1 application topically See admin instructions. Apply to both legs every other day 08/05/15  Bobbitt, Sedalia Muta, MD    Family History Family History  Problem Relation Age of Onset   Coronary artery disease Father 74       HTN, Hyperlipidemia died   Heart disease Father        Heart Disease before age 35   Hypertension Father    Hyperlipidemia Father    Peripheral vascular disease Father    Other Mother 77        died rheumatoid arthritis   Dementia Mother    Heart disease Brother    Hypertension Brother    Hyperlipidemia Brother     Social History Social History   Tobacco Use   Smoking status: Former Smoker    Types: Cigarettes    Last attempt to quit: 05/24/2005    Years since quitting: 13.4   Smokeless tobacco: Never Used  Substance Use Topics   Alcohol use: Yes    Alcohol/week: 24.0 standard drinks    Types: 24 Cans of beer per week    Comment: approximately 24 beer a week   Drug use: No     Allergies   Hctz [hydrochlorothiazide]; Heparin; Norvasc [amlodipine besylate]; and Pletal [cilostazol]   Review of Systems Review of Systems  Constitutional: Negative for chills and fever.  HENT: Negative for  congestion and facial swelling.   Eyes: Negative for discharge and visual disturbance.  Respiratory: Negative for shortness of breath.   Cardiovascular: Negative for chest pain and palpitations.  Gastrointestinal: Positive for abdominal pain, diarrhea, nausea and vomiting.  Musculoskeletal: Negative for arthralgias and myalgias.  Skin: Negative for color change and rash.  Neurological: Negative for tremors, syncope and headaches.  Psychiatric/Behavioral: Negative for confusion and dysphoric mood.     Physical Exam Updated Vital Signs BP (!) 173/67    Pulse 65    Resp 15    Ht 5\' 10"  (1.778 m)    Wt 88 kg    SpO2 96%    BMI 27.84 kg/m   Physical Exam Vitals signs and nursing note reviewed.  Constitutional:      Appearance: He is well-developed.  HENT:     Head: Normocephalic and atraumatic.  Eyes:     Pupils: Pupils are equal, round, and reactive to light.  Neck:     Musculoskeletal: Normal range of motion and neck supple.     Vascular: No JVD.  Cardiovascular:     Rate and Rhythm: Normal rate and regular rhythm.     Heart sounds: No murmur. No friction rub. No gallop.   Pulmonary:     Effort: No respiratory distress.     Breath sounds: No wheezing.  Abdominal:     General: There is no distension.     Palpations: There is pulsatile mass.     Tenderness: There is no guarding or rebound.     Hernia: A hernia is present. Hernia is present in the umbilical area ( Easily reducible.).  Musculoskeletal: Normal range of motion.  Skin:    Coloration: Skin is not pale.     Findings: No rash.  Neurological:     Mental Status: He is alert and oriented to person, place, and time.  Psychiatric:        Behavior: Behavior normal.      ED Treatments / Results  Labs (all labs ordered are listed, but only abnormal results are displayed) Labs Reviewed  COMPREHENSIVE METABOLIC PANEL - Abnormal; Notable for the following components:      Result Value   CO2  18 (*)    Glucose, Bld  109 (*)    BUN 68 (*)    Creatinine, Ser 3.62 (*)    Albumin 3.2 (*)    AST 14 (*)    Total Bilirubin 1.5 (*)    GFR calc non Af Amer 16 (*)    GFR calc Af Amer 19 (*)    All other components within normal limits  CBC - Abnormal; Notable for the following components:   WBC 11.1 (*)    RBC 2.80 (*)    Hemoglobin 9.3 (*)    HCT 27.7 (*)    All other components within normal limits  URINALYSIS, ROUTINE W REFLEX MICROSCOPIC - Abnormal; Notable for the following components:   Protein, ur 100 (*)    All other components within normal limits  LIPASE, BLOOD    EKG EKG Interpretation  Date/Time:  Monday Oct 16 2018 21:02:03 EDT Ventricular Rate:  64 PR Interval:    QRS Duration: 104 QT Interval:  419 QTC Calculation: 433 R Axis:   70 Text Interpretation:  Sinus rhythm Inferior infarct, old Minimal ST elevation, anterior leads Baseline wander in lead(s) V2 No significant change since last tracing Confirmed by Deno Etienne [phone]) on 10/16/2018 9:21:45 PM   Radiology Ct Abdomen Pelvis Wo Contrast  Result Date: 10/16/2018 CLINICAL DATA:  Severe abdominal pain. EXAM: CT ABDOMEN AND PELVIS WITHOUT CONTRAST TECHNIQUE: Multidetector CT imaging of the abdomen and pelvis was performed following the standard protocol without IV contrast. COMPARISON:  10/04/2018. FINDINGS: Lower chest: The heart size is enlarged. Chronic scarring or atelectasis is noted at the lung bases. Hepatobiliary: There is cholelithiasis without CT evidence of acute cholecystitis. The liver is unremarkable. No biliary ductal dilatation. Pancreas: Unremarkable. No pancreatic ductal dilatation or surrounding inflammatory changes. Spleen: Normal in size without focal abnormality. Adrenals/Urinary Tract: Both kidneys are somewhat atrophic with extensive perinephric fat stranding. This is similar to prior study. There is no hydronephrosis. The adrenal glands are unremarkable. The bladder is relatively decompressed but is otherwise  unremarkable. Stomach/Bowel: Stomach is within normal limits. Appendix appears normal. No evidence of bowel wall thickening, distention, or inflammatory changes. Vascular/Lymphatic: There are extensive atherosclerotic changes of the abdominal aorta. Again noted is infrarenal abdominal aortic aneurysm currently measuring approximately 8 cm (previously measuring approximately the same). There are increasing inflammatory changes towards the inferior aspect of the aneurysm. There is a small break in the peripheral calcifications in this location. There appear to be bilateral common iliac stents in place. There are no pathologically enlarged lymph nodes. Reproductive: Prostate is unremarkable. Other: There are bilateral fat containing inguinal hernias, right greater than left. There is a fat containing periumbilical hernia. Musculoskeletal: No fracture is seen. Old healed rib fractures are noted. IMPRESSION: 1. Again noted is an 8 cm infrarenal abdominal aortic aneurysm. There are increasing inflammatory changes surrounding the inferior aspect of the aneurysm. In the setting of abdominal pain, this is concerning for impending rupture. Surgical consultation is recommended. 2. Extensive atherosclerotic changes of the visualized vascular structures are again noted. 3. Somewhat atrophic kidneys bilaterally. 4. Stable additional chronic findings as above. Aortic Atherosclerosis (ICD10-I70.0). These results will be called to the ordering clinician or representative by the Radiologist Assistant, and communication documented in the PACS or zVision Dashboard. Electronically Signed   By: Constance Holster M.D.   On: 10/16/2018 21:55    Procedures Procedures (including critical care time)  Medications Ordered in ED Medications  sodium chloride flush (NS) 0.9 % injection 3  mL (has no administration in time range)  clevidipine (CLEVIPREX) infusion 0.5 mg/mL (8 mg/hr Intravenous Rate/Dose Change 10/16/18 2232)  HYDROmorphone  (DILAUDID) injection 0.5 mg (has no administration in time range)  HYDROmorphone (DILAUDID) injection 0.5 mg (0.5 mg Intravenous Given 10/16/18 2123)  HYDROmorphone (DILAUDID) injection 0.5 mg (0.5 mg Intravenous Given 10/16/18 2145)     Initial Impression / Assessment and Plan / ED Course  I have reviewed the triage vital signs and the nursing notes.  Pertinent labs & imaging results that were available during my care of the patient were reviewed by me and considered in my medical decision making (see chart for details).        68 yo M with a chief complaint of abdominal pain.  Patient has a known AAA.  Last measured was 8 cm.  Concern for rupture with presentation CT scan without contrast was performed due to worsening renal insufficiency.  No obvious signs of rupture on my view of CT I discussed the case with Dr. Scot Dock, vascular surgery, who came in to evaluate the patient.  Will admit to the hospital.  I started the patient on Cleviprex due to his significant hypertension.  CRITICAL CARE Performed by: Cecilio Asper   Total critical care time: 80 minutes  Critical care time was exclusive of separately billable procedures and treating other patients.  Critical care was necessary to treat or prevent imminent or life-threatening deterioration.  Critical care was time spent personally by me on the following activities: development of treatment plan with patient and/or surrogate as well as nursing, discussions with consultants, evaluation of patient's response to treatment, examination of patient, obtaining history from patient or surrogate, ordering and performing treatments and interventions, ordering and review of laboratory studies, ordering and review of radiographic studies, pulse oximetry and re-evaluation of patient's condition.  The patients results and plan were reviewed and discussed.   Any x-rays performed were independently reviewed by myself.   Differential  diagnosis were considered with the presenting HPI.  Medications  sodium chloride flush (NS) 0.9 % injection 3 mL (has no administration in time range)  clevidipine (CLEVIPREX) infusion 0.5 mg/mL (8 mg/hr Intravenous Rate/Dose Change 10/16/18 2232)  HYDROmorphone (DILAUDID) injection 0.5 mg (has no administration in time range)  HYDROmorphone (DILAUDID) injection 0.5 mg (0.5 mg Intravenous Given 10/16/18 2123)  HYDROmorphone (DILAUDID) injection 0.5 mg (0.5 mg Intravenous Given 10/16/18 2145)    Vitals:   10/16/18 2215 10/16/18 2220 10/16/18 2225 10/16/18 2230  BP: (!) 180/68 (!) 173/70 (!) 170/74 (!) 173/67  Pulse: 67 68 68 65  Resp: 11 17 16 15   SpO2: 97% 97% 96% 96%  Weight:      Height:        Final diagnoses:  Abdominal aortic aneurysm (AAA) greater than 5.5 cm in diameter in male [facility])    Admission/ observation were discussed with the admitting physician, patient and/or family and they are comfortable with the plan.    Final Clinical Impressions(s) / ED Diagnoses   Final diagnoses:  Abdominal aortic aneurysm (AAA) greater than 5.5 cm in diameter in male [facility])    ED Discharge Orders    None       Deno Etienne, DO 10/16/18 2232

## 2018-10-16 NOTE — ED Notes (Signed)
ED TO INPATIENT HANDOFF REPORT  ED Nurse Name and Phone #: Lymon Kidney [phone]  S Name/Age/Gender [name] 68 y.o. male Room/Bed: 034C/034C  Code Status   Code Status: Full Code  Home/SNF/Other Home Patient oriented to: self, place, time and situation Is this baseline? Yes   Triage Complete: Triage complete  Chief Complaint abd pain  Triage Note Pt here for severe abdominal pain that has worsened. Pt states his last BM over 1 week ago, tried fleet enemas to relieve pain with no relief. Wife at bedside, very frantic states she "worries his AAA has ruptured". Pt has taken tramadol at 1400 for pain with no relief. BP 192/100, HR 64, 99% room air, denies cough, SOB, fever.    Allergies Allergies  Allergen Reactions  . Hctz [Hydrochlorothiazide] Other (See Comments)    Blacked out due to electrolytes  . Heparin Other (See Comments)    HIT positive as of 12/31/14  . Norvasc [Amlodipine Besylate] Swelling    Site of swelling not recalled  . Pletal [Cilostazol] Diarrhea    Level of Care/Admitting Diagnosis ED Disposition    ED Disposition Condition [facility] Area: [place] [[id number]]  Level of Care: Progressive [102]  Covid Evaluation: Screening Protocol (No Symptoms)  Diagnosis: AAA (abdominal aortic aneurysm) without rupture [facility]) [[id number]]  Admitting Physician: Shirley Friar  Attending Physician: Shirley Friar  Estimated length of stay: 3 - 4 days  Certification:: I certify this patient will need inpatient services for at least 2 midnights  PT Class (Do Not Modify): Inpatient [101]  PT Acc Code (Do Not Modify): Private [1]       B Medical/Surgery History Past Medical History:  Diagnosis Date  . AAA (abdominal aortic aneurysm) ([place])   . Anxiety   . CHF (congestive heart failure) ([place])   . COPD (chronic obstructive pulmonary disease) ([place])   . Coronary artery disease   . GERD (gastroesophageal reflux  disease)   . History of blood transfusion   . Hyperlipidemia   . Hypertension    takes meds daily  . Hyponatremia   . Lumbar degenerative disc disease   . Myocardial infarction ([place])   . PAD (peripheral artery disease) ([place])   . PONV (postoperative nausea and vomiting)   . Raynaud's disease /phenomenon   . Stroke [facility]) 10/15/2005   Past Surgical History:  Procedure Laterality Date  . ABDOMINAL ANGIOGRAM  03/30/2012  . ABDOMINAL AORTAGRAM N/A 03/30/2012   Procedure: ABDOMINAL Maxcine Ham;  Surgeon: Conrad Oshkosh, MD;  Location: [facility] CATH LAB;  Service: Cardiovascular;  Laterality: N/A;  . CARDIAC CATHETERIZATION  2006  . CARDIAC CATHETERIZATION N/A 12/09/2014   Procedure: Left Heart Cath and Coronary Angiography;  Surgeon: [name], MD;  Location: [facility] CV LAB;  Service: Cardiovascular;  Laterality: N/A;  . CAROTID ENDARTERECTOMY     bilateral  . CAROTID ENDARTERECTOMY  04/13/2005   Right and 06/03/2005 Left  . CORONARY ARTERY BYPASS GRAFT N/A 12/13/2014   Procedure: CORONARY ARTERY BYPASS GRAFT times five using left internal mammary artery and endoscopic right leg saphenous vein harvest;  Surgeon: Gaye Pollack, MD;  Location: [facility] OR;  Service: Open Heart Surgery;  Laterality: N/A;  . HERNIA REPAIR    . ILIAC ARTERY STENT  10/15/2005    Left common and external   . INTRAOPERATIVE TRANSESOPHAGEAL ECHOCARDIOGRAM N/A 12/13/2014   Procedure: INTRAOPERATIVE TRANSESOPHAGEAL ECHOCARDIOGRAM;  Surgeon: Gaye Pollack, MD;  Location: [facility];  Service: Open Heart Surgery;  Laterality: N/A;     A IV Location/Drains/Wounds Patient Lines/Drains/Airways Status   Active Line/Drains/Airways    Name:   Placement date:   Placement time:   Site:   Days:   Peripheral IV 10/16/18 Right Antecubital   10/16/18    2116    Antecubital   less than 1   Peripheral IV 10/16/18 Right Wrist   10/16/18    2200    Wrist   less than 1          Intake/Output Last 24 hours No intake or output data in the 24  hours ending 10/16/18 2335  Labs/Imaging Results for orders placed or performed during the hospital encounter of 10/16/18 (from the past 48 hour(s))  Lipase, blood     Status: None   Collection Time: 10/16/18  7:49 PM  Result Value Ref Range   Lipase 39 11 - 51 U/L    Comment: Performed at [facility], [place] [address] [place], [place] [address]  Comprehensive metabolic panel     Status: Abnormal   Collection Time: 10/16/18  7:49 PM  Result Value Ref Range   Sodium 136 135 - 145 mmol/L   Potassium 4.3 3.5 - 5.1 mmol/L   Chloride 106 98 - 111 mmol/L   CO2 18 (L) 22 - 32 mmol/L   Glucose, Bld 109 (H) 70 - 99 mg/dL   BUN 68 (H) 8 - 23 mg/dL   Creatinine, Ser 3.62 (H) 0.61 - 1.24 mg/dL   Calcium 9.3 8.9 - 10.3 mg/dL   Total Protein 6.8 6.5 - 8.1 g/dL   Albumin 3.2 (L) 3.5 - 5.0 g/dL   AST 14 (L) 15 - 41 U/L   ALT 11 0 - 44 U/L   Alkaline Phosphatase 57 38 - 126 U/L   Total Bilirubin 1.5 (H) 0.3 - 1.2 mg/dL   GFR calc non Af Amer 16 (L) >60 mL/min   GFR calc Af Amer 19 (L) >60 mL/min   Anion gap 12 5 - 15    Comment: Performed at [facility], [place] [address] [place], [place] [address]  CBC     Status: Abnormal   Collection Time: 10/16/18  7:49 PM  Result Value Ref Range   WBC 11.1 (H) 4.0 - 10.5 K/uL   RBC 2.80 (L) 4.22 - 5.81 MIL/uL   Hemoglobin 9.3 (L) 13.0 - 17.0 g/dL   HCT 27.7 (L) 39.0 - 52.0 %   MCV 98.9 80.0 - 100.0 fL   MCH 33.2 26.0 - 34.0 pg   MCHC 33.6 30.0 - 36.0 g/dL   RDW 13.1 11.5 - 15.5 %   Platelets 154 150 - 400 K/uL   nRBC 0.0 0.0 - 0.2 %    Comment: Performed at [facility], [place] [address] [place], [place] [address]  Urinalysis, Routine w reflex microscopic     Status: Abnormal   Collection Time: 10/16/18  9:04 PM  Result Value Ref Range   Color, Urine YELLOW YELLOW   APPearance CLEAR CLEAR   Specific Gravity, Urine 1.014 1.005 - 1.030   pH 5.0 5.0 - 8.0   Glucose, UA NEGATIVE NEGATIVE mg/dL   Hgb urine dipstick NEGATIVE NEGATIVE    Bilirubin Urine NEGATIVE NEGATIVE   Ketones, ur NEGATIVE NEGATIVE mg/dL   Protein, ur 100 (A) NEGATIVE mg/dL   Nitrite NEGATIVE NEGATIVE   Leukocytes,Ua NEGATIVE NEGATIVE   RBC / HPF 0-5 0 - 5 RBC/hpf  WBC, UA 0-5 0 - 5 WBC/hpf   Bacteria, UA NONE SEEN NONE SEEN   Squamous Epithelial / LPF 0-5 0 - 5   Mucus PRESENT     Comment: Performed at [facility], [place] [address] [place], [place] [address]   Ct Abdomen Pelvis Wo Contrast  Result Date: 10/16/2018 CLINICAL DATA:  Severe abdominal pain. EXAM: CT ABDOMEN AND PELVIS WITHOUT CONTRAST TECHNIQUE: Multidetector CT imaging of the abdomen and pelvis was performed following the standard protocol without IV contrast. COMPARISON:  10/04/2018. FINDINGS: Lower chest: The heart size is enlarged. Chronic scarring or atelectasis is noted at the lung bases. Hepatobiliary: There is cholelithiasis without CT evidence of acute cholecystitis. The liver is unremarkable. No biliary ductal dilatation. Pancreas: Unremarkable. No pancreatic ductal dilatation or surrounding inflammatory changes. Spleen: Normal in size without focal abnormality. Adrenals/Urinary Tract: Both kidneys are somewhat atrophic with extensive perinephric fat stranding. This is similar to prior study. There is no hydronephrosis. The adrenal glands are unremarkable. The bladder is relatively decompressed but is otherwise unremarkable. Stomach/Bowel: Stomach is within normal limits. Appendix appears normal. No evidence of bowel wall thickening, distention, or inflammatory changes. Vascular/Lymphatic: There are extensive atherosclerotic changes of the abdominal aorta. Again noted is infrarenal abdominal aortic aneurysm currently measuring approximately 8 cm (previously measuring approximately the same). There are increasing inflammatory changes towards the inferior aspect of the aneurysm. There is a small break in the peripheral calcifications in this location. There appear to be bilateral  common iliac stents in place. There are no pathologically enlarged lymph nodes. Reproductive: Prostate is unremarkable. Other: There are bilateral fat containing inguinal hernias, right greater than left. There is a fat containing periumbilical hernia. Musculoskeletal: No fracture is seen. Old healed rib fractures are noted. IMPRESSION: 1. Again noted is an 8 cm infrarenal abdominal aortic aneurysm. There are increasing inflammatory changes surrounding the inferior aspect of the aneurysm. In the setting of abdominal pain, this is concerning for impending rupture. Surgical consultation is recommended. 2. Extensive atherosclerotic changes of the visualized vascular structures are again noted. 3. Somewhat atrophic kidneys bilaterally. 4. Stable additional chronic findings as above. Aortic Atherosclerosis (ICD10-I70.0). These results will be called to the ordering clinician or representative by the Radiologist Assistant, and communication documented in the PACS or zVision Dashboard. Electronically Signed   By: Constance Holster M.D.   On: 10/16/2018 21:55    Pending Labs Unresulted Labs (From admission, onward)    Start     Ordered   10/16/18 2314  HIV antibody (Routine Testing)  Once,   R     10/16/18 2317   10/16/18 2314  Urinalysis, Routine w reflex microscopic  Once,   R     10/16/18 2317          Vitals/Pain Today's Vitals   10/16/18 2300 10/16/18 2305 10/16/18 2310 10/16/18 2315  BP: (!) 148/68 (!) 143/65 (!) 144/65 140/67  Pulse: 65 64 63 64  Resp: 17 13 16 15   SpO2: 95% 97% 97% 97%  Weight:      Height:      PainSc:        Isolation Precautions No active isolations  Medications Medications  sodium chloride flush (NS) 0.9 % injection 3 mL (has no administration in time range)  clevidipine (CLEVIPREX) infusion 0.5 mg/mL (13 mg/hr Intravenous Rate/Dose Change 10/16/18 2313)  traMADol (ULTRAM) tablet 50 mg (has no administration in time range)  carvedilol (COREG) tablet 37.5 mg  (has no administration in time range)  cloNIDine (CATAPRES) tablet 0.2 mg (has no administration in time range)  nitroGLYCERIN (NITROSTAT) SL tablet 0.4 mg (has no administration in time range)  furosemide (LASIX) tablet 40 mg (has no administration in time range)  rosuvastatin (CRESTOR) tablet 20 mg (has no administration in time range)  loratadine (CLARITIN) tablet 10 mg (has no administration in time range)  hydrALAZINE (APRESOLINE) tablet 25 mg (has no administration in time range)  famotidine (PEPCID) tablet 20 mg (has no administration in time range)  ondansetron (ZOFRAN) tablet 8 mg (has no administration in time range)  lidocaine (LMX) 4 % cream (has no administration in time range)  sodium phosphate (FLEET) 7-19 GM/118ML enema 1 enema (has no administration in time range)  polyethylene glycol (MIRALAX / GLYCOLAX) packet 17 g (has no administration in time range)  clobetasol ointment (TEMOVATE) 1.17 % 1 application (has no administration in time range)  triamcinolone ointment (KENALOG) 0.1 % 1 application (has no administration in time range)  potassium chloride SA (K-DUR) CR tablet 20-40 mEq (has no administration in time range)  ondansetron (ZOFRAN) injection 4 mg (has no administration in time range)  alum & mag hydroxide-simeth (MAALOX/MYLANTA) 200-200-20 MG/5ML suspension 15-30 mL (has no administration in time range)  pantoprazole (PROTONIX) EC tablet 40 mg (has no administration in time range)  labetalol (NORMODYNE) injection 10 mg (has no administration in time range)  hydrALAZINE (APRESOLINE) injection 5 mg (has no administration in time range)  metoprolol tartrate (LOPRESSOR) injection 2-5 mg (has no administration in time range)  guaiFENesin-dextromethorphan (ROBITUSSIN DM) 100-10 MG/5ML syrup 15 mL (has no administration in time range)  phenol (CHLORASEPTIC) mouth spray 1 spray (has no administration in time range)  HYDROmorphone (DILAUDID) injection 0.5-1 mg (has no  administration in time range)  docusate sodium (COLACE) capsule 100 mg (has no administration in time range)  bisacodyl (DULCOLAX) suppository 10 mg (has no administration in time range)  fentaNYL (DURAGESIC) 50 MCG/HR 1 patch (has no administration in time range)  carvedilol (COREG) tablet 25 mg (has no administration in time range)  HYDROmorphone (DILAUDID) injection 0.5 mg (0.5 mg Intravenous Given 10/16/18 2123)  HYDROmorphone (DILAUDID) injection 0.5 mg (0.5 mg Intravenous Given 10/16/18 2145)  HYDROmorphone (DILAUDID) injection 0.5 mg (0.5 mg Intravenous Given 10/16/18 2254)    Mobility walks Low fall risk   Focused Assessments Cardiac Assessment Handoff:    Lab Results  Component Value Date   CKTOTAL 43 (L) 12/27/2014   TROPONINI 7.83 ([place]) 12/08/2014   No results found for: DDIMER Does the Patient currently have chest pain? No     R Recommendations: See Admitting Provider Note  Report given to:   Additional Notes: Cleviprex currently at 13mg /hr

## 2018-10-16 NOTE — ED Triage Notes (Signed)
Pt here for severe abdominal pain that has worsened. Pt states his last BM over 1 week ago, tried fleet enemas to relieve pain with no relief. Wife at bedside, very frantic states she "worries his AAA has ruptured". Pt has taken tramadol at 1400 for pain with no relief. BP 192/100, HR 64, 99% room air, denies cough, SOB, fever.

## 2018-10-16 NOTE — H&P (Signed)
REASON FOR CONSULT:    8 cm infrarenal abdominal aortic aneurysm.  The consult is requested by the emergency department.  ASSESSMENT & PLAN:   8 CM INFRARENAL ABDOMINAL AORTIC ANEURYSM: This patient has an 8 cm infrarenal abdominal aortic aneurysm.  He has previously been turned down for repair by both Dr. Bridgett Larsson and then subsequently by Dr. Vinnie Level in [place].  He has multiple issues which put him in the very high risk category.  His aorta is diffusely calcified and technically this felt that it may not be possible to clamp adequately given that he has calcific disease even in the supraceliac aorta.  In addition he has multiple medical comorbidities including congestive heart failure, coronary artery disease, stage IV chronic kidney disease, COPD, and heparin-induced thrombocytopenia.  His CT scan is not showing obvious rupture although there is some inflammation in the distal aorta which is somewhat worrisome.  After extensive discussion both with the patient and his wife they do not wish to consider emergent surgery.  In addition, his pain may very well be related to either his chronic back pain or to constipation given that he has not had a bowel movement in 8 days because he is on multiple pain medications.  If the aneurysm does frankly rupture then he would consider repair although I think the chance of surviving this is small.  We have discussed the option of proceeding with repair now despite the increased risk in both the patient and his wife do not wish to consider this.  CORONARY ARTERY DISEASE: The patient is followed by Dr. [name].  He was last seen in their office on 06/05/2018.  He has a history of coronary artery disease and congestive heart failure.  He had a CABG x5 in 2016.  He had been on aspirin and Plavix but this was stopped in October 2018 because of a significant episode of epistasis.  STAGE IV CHRONIC KIDNEY DISEASE: I do not see that the patient has a  nephrologist and we will consider consultation during this hospitalization.  CHRONIC BACK PAIN: He has had significant problems with pain management and will need referral to a pain management center for management of his back pain.  This can be done as an outpatient.  HEPARIN-INDUCED THROMBOCYTOPENIA: The patient has documented heparin-induced thrombocytopenia.  ABDOMINAL HERNIA: The patient had decided not to address his hernia as he was very high risk.  Deitra Mayo, MD, FACS [facility] [phone] Office: [phone]   HPI:   [name] is a pleasant 68 y.o. male, who presents to the emergency department tonight with abdominal pain which he has had since this morning.  He has a complicated history and that he has had a known aneurysm for many years but was turned down for surgery by Dr. Sammuel Hines in [facility] as he was felt to be too high risk for surgery given his diffusely calcified aorta.  He had previously been turned down by Dr. Bridgett Larsson for the same reason.  He elected not to consider repair because of the risk involved.  In addition to the technical aspects related to the calcium patient has COPD, CHF, coronary artery disease, and chronic kidney disease.  These all put him at very high risk for open surgery and is clearly not a candidate for an endovascular approach.  On his recent hospital visit for hypertension his aneurysm was discussed with the patient and at that point made it clear that he did not want to consider  repair.  According to the patient's wife the films were reviewed at that time by Dr. Trula Slade.  Patient has chronic back pain and has had multiple injections for his back.  Most recently he had an injection approximately a week ago and has had worsening back pain since that time.  In addition because he is on a significant amounts of pain medicine he has not moved his bowels in over 8 days and is constipated.  Since he is received Dilaudid for pain in the emergency department  his pain has improved significantly as has his blood pressure.  Patient has had previous bilateral carotid endarterectomies by Dr. Drucie Opitz and also bilateral iliac stents by Dr. Drucie Opitz.  The patient has significant renal insufficiency but apparently is not followed by nephrology.  Past Medical History:  Diagnosis Date  . AAA (abdominal aortic aneurysm) ([place])   . Anxiety   . CHF (congestive heart failure) ([place])   . COPD (chronic obstructive pulmonary disease) ([place])   . Coronary artery disease   . GERD (gastroesophageal reflux disease)   . History of blood transfusion   . Hyperlipidemia   . Hypertension    takes meds daily  . Hyponatremia   . Lumbar degenerative disc disease   . Myocardial infarction ([place])   . PAD (peripheral artery disease) ([place])   . PONV (postoperative nausea and vomiting)   . Raynaud's disease /phenomenon   . Stroke [facility]) 10/15/2005    Family History  Problem Relation Age of Onset  . Coronary artery disease Father 68       HTN, Hyperlipidemia died  . Heart disease Father        Heart Disease before age 17  . Hypertension Father   . Hyperlipidemia Father   . Peripheral vascular disease Father   . Other Mother 83        died rheumatoid arthritis  . Dementia Mother   . Heart disease Brother   . Hypertension Brother   . Hyperlipidemia Brother     SOCIAL HISTORY: Social History   Socioeconomic History  . Marital status: Married    Spouse name: Not on file  . Number of children: Not on file  . Years of education: Not on file  . Highest education level: Not on file  Occupational History  . Occupation: Freight forwarder. owns Education officer, community: [place]  . Financial resource strain: Not on file  . Food insecurity:    Worry: Not on file    Inability: Not on file  . Transportation needs:    Medical: Not on file    Non-medical: Not on file  Tobacco Use  . Smoking status: Former Smoker    Types: Cigarettes    Last  attempt to quit: 05/24/2005    Years since quitting: 13.4  . Smokeless tobacco: Never Used  Substance and Sexual Activity  . Alcohol use: Yes    Alcohol/week: 24.0 standard drinks    Types: 24 Cans of beer per week    Comment: approximately 24 beer a week  . Drug use: No  . Sexual activity: Not on file  Lifestyle  . Physical activity:    Days per week: Not on file    Minutes per session: Not on file  . Stress: Not on file  Relationships  . Social connections:    Talks on phone: Not on file    Gets together: Not on file    Attends religious service: Not  on file    Active member of club or organization: Not on file    Attends meetings of clubs or organizations: Not on file    Relationship status: Not on file  . Intimate partner violence:    Fear of current or ex partner: Not on file    Emotionally abused: Not on file    Physically abused: Not on file    Forced sexual activity: Not on file  Other Topics Concern  . Not on file  Social History Narrative  . Not on file    Allergies  Allergen Reactions  . Hctz [Hydrochlorothiazide] Other (See Comments)    Blacked out due to electrolytes  . Heparin Other (See Comments)    HIT positive as of 12/31/14  . Norvasc [Amlodipine Besylate] Swelling    Site of swelling not recalled  . Pletal [Cilostazol] Diarrhea    Current Facility-Administered Medications  Medication Dose Route Frequency Provider Last Rate Last Dose  . clevidipine (CLEVIPREX) infusion 0.5 mg/mL  0-21 mg/hr Intravenous Continuous Deno Etienne, DO 16 mL/hr at 10/16/18 2232 8 mg/hr at 10/16/18 2232  . HYDROmorphone (DILAUDID) injection 0.5 mg  0.5 mg Intravenous Once Tyrone Nine, Dan, DO      . sodium chloride flush (NS) 0.9 % injection 3 mL  3 mL Intravenous Once Deno Etienne, DO       Current Outpatient Medications  Medication Sig Dispense Refill  . Ascorbic Acid (VITAMIN C) 100 MG tablet Take 100 mg by mouth daily.    . carvedilol (COREG) 25 MG tablet TAKE 1 AND 1/2 TABLETS  BY MOUTH EVERY MORNING AND TAKE 1 TABLET EVERY AFTERNOON (Patient taking differently: Take 25-37.5 mg by mouth See admin instructions. Take 37.5 mg by mouth in the morning and 25 mg in the afternoon) 75 tablet 8  . cetirizine (ZYRTEC) 10 MG tablet Take 10 mg by mouth daily.    . clobetasol ointment (TEMOVATE) 4.56 % Apply 1 application topically See admin instructions. Apply to both legs every other day  0  . cloNIDine (CATAPRES) 0.2 MG tablet TAKE 1 TABLET BY MOUTH TWICE DAILY (Patient taking differently: Take 0.2 mg by mouth 2 (two) times daily. ) 60 tablet 10  . Famotidine (PEPCID AC PO) Take 1 tablet by mouth at bedtime.    . ferrous sulfate 325 (65 FE) MG tablet Take 1 tablet (325 mg total) by mouth daily with breakfast for 30 days. 30 tablet 3  . folic acid (FOLVITE) 1 MG tablet Take 1 tablet (1 mg total) by mouth daily. (Patient taking differently: Take 1 mg by mouth at bedtime. ) 30 tablet 0  . furosemide (LASIX) 40 MG tablet Take 40 mg by mouth daily.    . hydrALAZINE (APRESOLINE) 25 MG tablet Take 1 tablet (25 mg total) by mouth every 8 (eight) hours for 30 days. 90 tablet 0  . nitroGLYCERIN (NITROSTAT) 0.4 MG SL tablet Place 0.4 mg under the tongue every 5 (five) minutes as needed for chest pain.    . rosuvastatin (CRESTOR) 20 MG tablet TAKE 1 TABLET BY MOUTH AT BEDTIME (Patient taking differently: Take 20 mg by mouth at bedtime. ) 90 tablet 2  . Thiamine HCl (VITAMIN B-1) 100 MG tablet Take 100 mg by mouth daily.      . traMADol (ULTRAM) 50 MG tablet Take 1 tablet (50 mg total) by mouth every 6 (six) hours as needed. (Patient taking differently: Take 50 mg by mouth every 6 (six) hours as needed (for pain). )  30 tablet 0  . triamcinolone ointment (KENALOG) 0.1 % Apply 1 application topically 2 (two) times daily. (Patient taking differently: Apply 1 application topically See admin instructions. Apply to both legs every other day) 80 g 1    REVIEW OF SYSTEMS:  [X]  denotes positive finding,  [ ]  denotes negative finding Cardiac  Comments:  Chest pain or chest pressure:    Shortness of breath upon exertion: x   Short of breath when lying flat:    Irregular heart rhythm:        Vascular    Pain in calf, thigh, or hip brought on by ambulation:    Pain in feet at night that wakes you up from your sleep:     Blood clot in your veins:    Leg swelling:         Pulmonary    Oxygen at home:    Productive cough:     Wheezing:         Neurologic    Sudden weakness in arms or legs:     Sudden numbness in arms or legs:     Sudden onset of difficulty speaking or slurred speech:    Temporary loss of vision in one eye:     Problems with dizziness:         Gastrointestinal    Blood in stool:     Vomited blood:         Genitourinary    Burning when urinating:     Blood in urine:        Psychiatric    Major depression:         Hematologic    Bleeding problems:    Problems with blood clotting too easily:        Skin    Rashes or ulcers:        Constitutional    Fever or chills:     PHYSICAL EXAM:   Vitals:   10/16/18 2215 10/16/18 2220 10/16/18 2225 10/16/18 2230  BP: (!) 180/68 (!) 173/70 (!) 170/74 (!) 173/67  Pulse: 67 68 68 65  Resp: 11 17 16 15   SpO2: 97% 97% 96% 96%  Weight:      Height:        GENERAL: The patient is a well-nourished male, in no acute distress. The vital signs are documented above. CARDIAC: There is a regular rate and rhythm.  VASCULAR: He has bilateral carotid bruits. On the right side I cannot palpate a femoral pulse or pedal pulses. On the left side he has a palpable femoral, dorsalis pedis, and posterior tibial pulse. He has no significant lower extremity swelling. PULMONARY: There is good air exchange bilaterally without wheezing or rales. ABDOMEN: Soft and non-tender with normal pitched bowel sounds.  He has a large abdominal hernia with abdominal contents within the hernia.  I am unable to palpate his aneurysm because of the  size. MUSCULOSKELETAL: There are no major deformities or cyanosis. NEUROLOGIC: No focal weakness or paresthesias are detected. SKIN: There are no ulcers or rashes noted. PSYCHIATRIC: The patient has a normal affect.  DATA:    LABS: His creatinine is 3.62.  His GFR is 16. White blood cell count 11.1.  Hemoglobin 9.3.  Hematocrit 27.7.  Platelets 154,000. Pro time 12.9.  CT ABDOMEN PELVIS WITHOUT CONTRAST: His CT scan shows that his aneurysm is 8 cm which is not changed compared to the study that was done on 10/04/2018.  There is no clear evidence of  rupture no retroperitoneal hematoma.  There are however some inflammatory changes around the distal aorta which are somewhat worrisome.  Importantly, the patient has severe calcific disease which includes the supraceliac juxtarenal and iliac arteries bilaterally.  He has diffuse calcific disease.

## 2018-10-16 NOTE — ED Notes (Signed)
Pt in CT at this time.

## 2018-10-17 ENCOUNTER — Telehealth: Payer: Self-pay | Admitting: *Deleted

## 2018-10-17 ENCOUNTER — Other Ambulatory Visit (HOSPITAL_COMMUNITY): Payer: Medicare Other

## 2018-10-17 ENCOUNTER — Other Ambulatory Visit: Payer: Self-pay

## 2018-10-17 ENCOUNTER — Inpatient Hospital Stay (HOSPITAL_COMMUNITY): Payer: Medicare Other

## 2018-10-17 DIAGNOSIS — I2510 Atherosclerotic heart disease of native coronary artery without angina pectoris: Secondary | ICD-10-CM

## 2018-10-17 DIAGNOSIS — I739 Peripheral vascular disease, unspecified: Secondary | ICD-10-CM

## 2018-10-17 DIAGNOSIS — N184 Chronic kidney disease, stage 4 (severe): Secondary | ICD-10-CM

## 2018-10-17 DIAGNOSIS — I714 Abdominal aortic aneurysm, without rupture, unspecified: Secondary | ICD-10-CM

## 2018-10-17 LAB — SURGICAL PCR SCREEN
MRSA, PCR: NEGATIVE
Staphylococcus aureus: NEGATIVE

## 2018-10-17 LAB — SARS CORONAVIRUS 2 BY RT PCR (HOSPITAL ORDER, PERFORMED IN ~~LOC~~ HOSPITAL LAB): SARS Coronavirus 2: NEGATIVE

## 2018-10-17 MED ORDER — MUPIROCIN 2 % EX OINT
1.0000 "application " | TOPICAL_OINTMENT | Freq: Two times a day (BID) | CUTANEOUS | Status: DC
  Administered 2018-10-17 – 2018-10-20 (×6): 1 via NASAL
  Filled 2018-10-17 (×2): qty 22

## 2018-10-17 MED ORDER — MAGNESIUM CITRATE PO SOLN
300.0000 mL | Freq: Once | ORAL | Status: DC
  Filled 2018-10-17: qty 592

## 2018-10-17 MED ORDER — SORBITOL 70 % SOLN
960.0000 mL | TOPICAL_OIL | Freq: Once | ORAL | Status: AC
  Administered 2018-10-17: 960 mL via RECTAL
  Filled 2018-10-17: qty 473

## 2018-10-17 MED ORDER — CHLORHEXIDINE GLUCONATE CLOTH 2 % EX PADS
6.0000 | MEDICATED_PAD | Freq: Every day | CUTANEOUS | Status: DC
  Administered 2018-10-17 – 2018-10-20 (×3): 6 via TOPICAL

## 2018-10-17 MED ORDER — NALOXEGOL OXALATE 12.5 MG PO TABS
12.5000 mg | ORAL_TABLET | Freq: Every day | ORAL | Status: DC
  Administered 2018-10-17 – 2018-10-20 (×4): 12.5 mg via ORAL
  Filled 2018-10-17 (×4): qty 1

## 2018-10-17 MED ORDER — ORAL CARE MOUTH RINSE
15.0000 mL | Freq: Two times a day (BID) | OROMUCOSAL | Status: DC
  Administered 2018-10-17 – 2018-10-20 (×5): 15 mL via OROMUCOSAL

## 2018-10-17 MED ORDER — MAGNESIUM CITRATE PO SOLN
1.0000 | Freq: Once | ORAL | Status: AC
  Administered 2018-10-19: 11:00:00 1 via ORAL
  Filled 2018-10-17 (×2): qty 296

## 2018-10-17 NOTE — ED Notes (Signed)
Dr. Scot Dock paged for page change to ICU d/t Cleviprex

## 2018-10-17 NOTE — Progress Notes (Signed)
Patient arrived from 2 heart to room 4 E 11.  Patient placed in bed and vital signs done. Patient placed on monitor and CCMD verified. Will continue to monitor patient. Jaser Fullen, Bettina Gavia RN

## 2018-10-17 NOTE — Telephone Encounter (Signed)
-----   Message from Angelia Mould, MD sent at 10/17/2018 10:08 AM EDT ----- Regarding: renal referral This patient has stage IV chronic kidney disease.  His GFR is 16.  I have spoken to nephrology and they feel that it would be appropriate to have him seen as an outpatient as he does not have a nephrologist.  Given that he has good urine output, no electrolyte abnormalities, no renal symptoms they did not think inpatient consultation was necessary. Thank you. CD

## 2018-10-17 NOTE — Progress Notes (Addendum)
Patient wife updated by phone on patient that transferred to [name]. Will monitor patient. Delsa Walder, Bettina Gavia RN

## 2018-10-17 NOTE — Telephone Encounter (Signed)
York Cerise, CMA is working on this referral.

## 2018-10-17 NOTE — Progress Notes (Addendum)
VASCULAR SURGERY ASSESSMENT & PLAN:   8 CM INFRARENAL ABDOMINAL AORTIC ANEURYSM: The patient does not wish to consider repair of his aneurysm.  His history is complicated.  Please refer to my note from last night for details concerning this aneurysm which he has known about for many years. We will ween his cleviprex that was started in the emergency department.  CORONARY ARTERY DISEASE:  His cardiologist is Dr. [name].  Currently he does not have any active cardiac issues.  We will only consult them if needed.  STAGE IV CHRONIC KIDNEY DISEASE: I do not see that the patient has a nephrologist.  I have spoken with nephrology and as he is currently making reasonable urine and has no uremic symptoms and has no electrolyte abnormalities we will arrange renal evaluation as an outpatient.  CHRONIC BACK PAIN: He has had significant problems with management of his back pain.  Currently he is on fentanyl patch, IV Dilaudid as needed, and tramadol.  My office has made a referral to the pain clinic and they will be in touch with him with an appointment as an outpatient.  CONSTIPATION: He has not moved his bowels in over a week.  This is likely secondary to narcotics because of his chronic pain issues with his back.  I will try some mag citrate and an enema. Will also start Movantik (renal dose).  HEPARIN-INDUCED THROMBOCYTOPENIA: The patient has documented heparin-induced thrombocytopenia.  Thus for DVT prophylaxis we will simply use SCDs.  ABDOMINAL HERNIA: The patient had decided not to address his hernia as he was very high risk.  I do not think this is the cause of his abdominal pain that he presented with.  Transfer to 4 E.  SUBJECTIVE:   His abdominal pain has essentially resolved.  PHYSICAL EXAM:   Vitals:   10/17/18 0115 10/17/18 0145 10/17/18 0230 10/17/18 0400  BP: (!) 149/68 (!) 148/70 138/67   Pulse: 64 66 67   Resp: 11 11 14    Temp:    98.5 F (36.9 C)  TempSrc:     Oral  SpO2: 97% 100% 97%   Weight:    82 kg  Height:       Abdomen is soft and nontender. Both feet are warm and well-perfused. Lungs are clear.  LABS:   Lab Results  Component Value Date   WBC 11.1 (H) 10/16/2018   HGB 9.3 (L) 10/16/2018   HCT 27.7 (L) 10/16/2018   MCV 98.9 10/16/2018   PLT 154 10/16/2018   Lab Results  Component Value Date   CREATININE 3.62 (H) 10/16/2018   Lab Results  Component Value Date   INR 0.98 02/25/2017    PROBLEM LIST:    Active Problems:   AAA (abdominal aortic aneurysm) without rupture (HCC)   CURRENT MEDS:   . carvedilol  25 mg Oral QPM  . carvedilol  37.5 mg Oral q morning - 10a  . Chlorhexidine Gluconate Cloth  6 each Topical Daily  . clobetasol ointment  1 application Topical QODAY  . cloNIDine  0.2 mg Oral BID  . docusate sodium  100 mg Oral BID  . famotidine  20 mg Oral QHS  . fentaNYL  1 patch Transdermal Q72H  . hydrALAZINE  25 mg Oral Q8H  . lidocaine   Topical Q12H  . loratadine  10 mg Oral Daily  . mouth rinse  15 mL Mouth Rinse BID  . mupirocin ointment  1 application Nasal BID  . pantoprazole  40 mg Oral Daily  . potassium chloride  20-40 mEq Oral Once  . rosuvastatin  20 mg Oral QHS  . sodium phosphate  1 enema Rectal Once  . triamcinolone ointment  1 application Topical [place]: [phone] Office: [phone] 10/17/2018

## 2018-10-18 DIAGNOSIS — Z7189 Other specified counseling: Secondary | ICD-10-CM

## 2018-10-18 DIAGNOSIS — I714 Abdominal aortic aneurysm, without rupture: Principal | ICD-10-CM

## 2018-10-18 DIAGNOSIS — Z515 Encounter for palliative care: Secondary | ICD-10-CM

## 2018-10-18 LAB — HIV ANTIBODY (ROUTINE TESTING W REFLEX): HIV Screen 4th Generation wRfx: NONREACTIVE

## 2018-10-18 LAB — BASIC METABOLIC PANEL
Anion gap: 13 (ref 5–15)
BUN: 64 mg/dL — ABNORMAL HIGH (ref 8–23)
CO2: 17 mmol/L — ABNORMAL LOW (ref 22–32)
Calcium: 8.9 mg/dL (ref 8.9–10.3)
Chloride: 106 mmol/L (ref 98–111)
Creatinine, Ser: 3.6 mg/dL — ABNORMAL HIGH (ref 0.61–1.24)
GFR calc Af Amer: 19 mL/min — ABNORMAL LOW (ref >60)
GFR calc non Af Amer: 16 mL/min — ABNORMAL LOW (ref >60)
Glucose, Bld: 100 mg/dL — ABNORMAL HIGH (ref 70–99)
Potassium: 4.2 mmol/L (ref 3.5–5.1)
Sodium: 136 mmol/L (ref 135–145)

## 2018-10-18 MED ORDER — LABETALOL HCL 5 MG/ML IV SOLN
10.0000 mg | INTRAVENOUS | Status: DC | PRN
  Administered 2018-10-20: 10 mg via INTRAVENOUS
  Filled 2018-10-18: qty 4

## 2018-10-18 MED ORDER — TRAMADOL HCL 50 MG PO TABS
50.0000 mg | ORAL_TABLET | Freq: Four times a day (QID) | ORAL | Status: DC
  Administered 2018-10-18 – 2018-10-20 (×10): 50 mg via ORAL
  Filled 2018-10-18 (×9): qty 1

## 2018-10-18 MED ORDER — TRAZODONE HCL 50 MG PO TABS
50.0000 mg | ORAL_TABLET | Freq: Every day | ORAL | Status: DC
  Administered 2018-10-18 – 2018-10-19 (×2): 50 mg via ORAL
  Filled 2018-10-18 (×2): qty 1

## 2018-10-18 MED ORDER — MORPHINE SULFATE (CONCENTRATE) 10 MG/0.5ML PO SOLN: 10.0000 mg | ORAL | Status: DC | PRN

## 2018-10-18 MED ORDER — FENTANYL 12 MCG/HR TD PT72
1.0000 | MEDICATED_PATCH | TRANSDERMAL | Status: DC
  Administered 2018-10-18: 1 via TRANSDERMAL
  Filled 2018-10-18: qty 1

## 2018-10-18 MED ORDER — HYDRALAZINE HCL 20 MG/ML IJ SOLN: 5.0000 mg | INTRAMUSCULAR | Status: DC | PRN

## 2018-10-18 MED ORDER — SENNA 8.6 MG PO TABS
1.0000 | ORAL_TABLET | Freq: Two times a day (BID) | ORAL | Status: DC
  Administered 2018-10-18 – 2018-10-20 (×5): 8.6 mg via ORAL
  Filled 2018-10-18 (×5): qty 1

## 2018-10-18 MED ORDER — LIDOCAINE 5 % EX PTCH
1.0000 | MEDICATED_PATCH | CUTANEOUS | Status: DC
  Administered 2018-10-18 – 2018-10-20 (×3): 1 via TRANSDERMAL
  Filled 2018-10-18 (×3): qty 1

## 2018-10-18 MED ORDER — POLYETHYLENE GLYCOL 3350 17 G PO PACK
17.0000 g | PACK | Freq: Every day | ORAL | Status: DC
  Administered 2018-10-19 – 2018-10-20 (×2): 17 g via ORAL
  Filled 2018-10-18 (×2): qty 1

## 2018-10-18 NOTE — Progress Notes (Addendum)
  Progress Note    10/18/2018 7:27 AM  Subjective: Back pain unchanged.  Still requiring dilaudid q4h.  Bowel movement yesterday.   Vitals:   10/17/18 2329 10/18/18 0611  BP: (!) 144/72 (!) 153/83  Pulse: 75 76  Resp: 17 18  Temp: 98.7 F (37.1 C) 99 F (37.2 C)  SpO2: 94% 96%   Physical Exam Lungs:  Non labored Extremities:  Moving all extremities well; feet symmetrically warm to touch with good cap refill Abdomen:  Diffusely tender to deep palpation Neurologic: A&O  CBC    Component Value Date/Time   WBC 11.1 (H) 10/16/2018 1949   RBC 2.80 (L) 10/16/2018 1949   HGB 9.3 (L) 10/16/2018 1949   HGB 9.4 (L) 06/05/2018 0907   HCT 27.7 (L) 10/16/2018 1949   HCT 26.4 (L) 06/05/2018 0907   PLT 154 10/16/2018 1949   PLT 115 (L) 06/05/2018 0907   MCV 98.9 10/16/2018 1949   MCV 100 (H) 06/05/2018 0907   MCH 33.2 10/16/2018 1949   MCHC 33.6 10/16/2018 1949   RDW 13.1 10/16/2018 1949   RDW 12.2 06/05/2018 0907   LYMPHSABS 1.0 02/01/2018 1254   MONOABS 1.0 (H) 02/01/2018 1254   EOSABS 0.5 02/01/2018 1254   BASOSABS 0.1 02/01/2018 1254    BMET    Component Value Date/Time   NA 136 10/16/2018 1949   NA 131 (L) 06/05/2018 0907   K 4.3 10/16/2018 1949   CL 106 10/16/2018 1949   CO2 18 (L) 10/16/2018 1949   GLUCOSE 109 (H) 10/16/2018 1949   BUN 68 (H) 10/16/2018 1949   BUN 31 (H) 06/05/2018 0907   CREATININE 3.62 (H) 10/16/2018 1949   CREATININE 0.97 10/14/2015 1233   CALCIUM 9.3 10/16/2018 1949   GFRNONAA 16 (L) 10/16/2018 1949   GFRAA 19 (L) 10/16/2018 1949    INR    Component Value Date/Time   INR 0.98 02/25/2017 1352     Intake/Output Summary (Last 24 hours) at 10/18/2018 4098 Last data filed at 10/17/2018 1950 Gross per 24 hour  Intake 255.01 ml  Output 380 ml  Net -124.99 ml     Assessment/Plan:  68 y.o. male with large AAA, chronic back pain  Hypertensive overnight; prn med parameters adjusted CKD: recheck BMP, urine output overnight not  documented Back pain: wean from IV dilaudid; referral made to pain clinic Patient still does not wish to pursue repair of aneurysm; will consult palliative care Home when no longer requiring IV pain medication   Dagoberto Ligas, PA-C [facility] [phone] 10/18/2018 7:27 AM  I agree with the above.  I have seen and evaluated the patient.  We appreciate palliative care's assistance with overall plans for comfort measures.  It is not completely clear at this time what is causing his abdominal pain and back pain, however with a 8 cm aneurysm this is certainly concerning.  The patient reiterated today that he does not want elective repair.  He has been turned down for repair at [facility].  I do not think he has good open surgical options.  I told him that in the event of rupture, we would not proceed with surgery.  We will make plans for pain control and hopefully discharge home.  I updated the wife via phone  Annamarie Major

## 2018-10-18 NOTE — Care Management (Signed)
Spoke w patient's wife on the phone. She declined [place] services. She has [place] prior to admission, but states that she will be with patient 24/7 and his main concern is constipation and pain. She is requesting home palliative services. She believes that rep from [facility] has contacted her.  Will confirm with [facility] that referral has been placed for services tomorrow.

## 2018-10-18 NOTE — Plan of Care (Signed)
Care plans reviewed and patient is progressing.  

## 2018-10-18 NOTE — Consult Note (Signed)
Consultation Note Date: 10/18/2018   Patient Name: [name]  DOB: 1950/11/26  MRN: [id number]  Age / Sex: 68 y.o., male  PCP: Alroy Dust, L.Marlou Sa, MD Referring Physician: Angelia Mould, *  Reason for Consultation: Establishing goals of care, Pain control and Psychosocial/spiritual support  HPI/Patient Profile: 68 y.o. male  with past medical history of an 8 cm infrarenal abdominal aortic aneurysm, CAD s/p CABG x5 in 5750, chronic systolic heart failure, stage IV CKD, HTN, OA, admitted on 10/16/2018 with acute abdominal pain. Patient diagnosed with AAA in 2012 which has progressively enlarged and become more symptomatic in the last year. Given the complexity of the aneurysm and multiple medical comorbidities, he is in the very high risk category for elective repair. He and his wife do not wish to pursue repair.    Clinical Assessment and Goals of Care:  I have reviewed medical records including EPIC notes, labs and imaging, received report from Dr. Trula Slade, assessed the patient and then met at the bedside along with Mariana Kaufman, NP  to discuss diagnosis prognosis, GOC, EOL wishes, disposition and options.  I introduced Palliative Medicine as specialized medical care for people living with serious illness. It focuses on providing relief from the symptoms and stress of a serious illness. The goal is to improve quality of life for both the patient and the family. Mr. Cumbie was in quite a bit of pain on our evaluation, but was able to tell us his understanding of his AAA and that it will likely take his life. His primary concern is getting home to be with his wife and being comfortable. He endorsed relying on his wife for most of his healthcare management and requested that we call and speak to her regarding his wishes and plans moving forward.   I spoke with his wife, [name], on the phone. She reviewed the  extensive health journey she has been on with her husband. She is aware that this aneurysm will take his life and that no one can really predict when. I did explain to her that with the size that it is now and how symptomatic it has become are poor prognostic factors.    As far as functional and nutritional status, he has become increasingly dependent on her as his caretaker. He has been able to get up and walk to the bathroom with a walker, but not much beyond that. He sleeps a lot during the day, and his appetite has become increasingly poor.   She also confirmed that his primary wishes for goals of care are to be home and be as comfortable as possible. I explained and offered outpatient hospice services which she is agreeable to.   His CODE status was re-addressed, and she has confirmed that he is a DNR.  Questions and concerns were addressed. The family was encouraged to call with questions or concerns.    Primary Decision Maker PATIENT    SUMMARY OF RECOMMENDATIONS - CODE status changed to DNR - plan for home with hospice services  at discharge - pain management: Fentanyl patch 12 mcg/hr with liquid morphine 10 mg q 2 hrs for breakthrough - Will continue prn IV dilaudid for now- but recommend to encourage use of PO morphine first and d/c IV dilaudid tomorrow in preparation for d/c home  - bowel regimen: change Miralax to daily added Senokot 1 tab BID - Trazodone 50 mg qhs for sleep     Code Status/Advance Care Planning:  DNR    Symptom Management:   Pain and bowel regimen as above   Palliative Prophylaxis:   Bowel Regimen and Frequent Pain Assessment  Additional Recommendations (Limitations, Scope, Preferences):  Avoid Hospitalization and No Surgical Procedures  Psycho-social/Spiritual:   Desire for further Chaplaincy support:no  Additional Recommendations: Education on Hospice  Prognosis:    < 6 months; patient is at risk for sudden death to large, symptomatic  AAA, notable change in functional status over last several months, no plans for further aggressive treatment or rehospitalization.   Discharge Planning: Home with Hospice  Primary Diagnoses: Present on Admission: . AAA (abdominal aortic aneurysm) without rupture ([place])   I have reviewed the medical record, interviewed the patient and family, and examined the patient. The following aspects are pertinent.  Past Medical History:  Diagnosis Date  . AAA (abdominal aortic aneurysm) ([place])   . Anxiety   . CHF (congestive heart failure) ([place])   . COPD (chronic obstructive pulmonary disease) ([place])   . Coronary artery disease   . GERD (gastroesophageal reflux disease)   . History of blood transfusion   . Hyperlipidemia   . Hypertension    takes meds daily  . Hyponatremia   . Lumbar degenerative disc disease   . Myocardial infarction ([place])   . PAD (peripheral artery disease) ([place])   . PONV (postoperative nausea and vomiting)   . Raynaud's disease /phenomenon   . Stroke [facility]) 10/15/2005   Social History   Socioeconomic History  . Marital status: Married    Spouse name: Not on file  . Number of children: Not on file  . Years of education: Not on file  . Highest education level: Not on file  Occupational History  . Occupation: Freight forwarder. owns Education officer, community: [employer]  . Financial resource strain: Not on file  . Food insecurity:    Worry: Not on file    Inability: Not on file  . Transportation needs:    Medical: Not on file    Non-medical: Not on file  Tobacco Use  . Smoking status: Former Smoker    Types: Cigarettes    Last attempt to quit: 05/24/2005    Years since quitting: 13.4  . Smokeless tobacco: Never Used  Substance and Sexual Activity  . Alcohol use: Yes    Alcohol/week: 24.0 standard drinks    Types: 24 Cans of beer per week    Comment: approximately 24 beer a week  . Drug use: No  . Sexual activity: Not on file  Lifestyle  .  Physical activity:    Days per week: Not on file    Minutes per session: Not on file  . Stress: Not on file  Relationships  . Social connections:    Talks on phone: Not on file    Gets together: Not on file    Attends religious service: Not on file    Active member of club or organization: Not on file    Attends meetings of clubs or organizations: Not on file  Relationship status: Not on file  Other Topics Concern  . Not on file  Social History Narrative  . Not on file   Family History  Problem Relation Age of Onset  . Coronary artery disease Father 54       HTN, Hyperlipidemia died  . Heart disease Father        Heart Disease before age 51  . Hypertension Father   . Hyperlipidemia Father   . Peripheral vascular disease Father   . Other Mother 19        died rheumatoid arthritis  . Dementia Mother   . Heart disease Brother   . Hypertension Brother   . Hyperlipidemia Brother    Scheduled Meds: . carvedilol  25 mg Oral QPM  . carvedilol  37.5 mg Oral q morning - 10a  . Chlorhexidine Gluconate Cloth  6 each Topical Daily  . clobetasol ointment  1 application Topical QODAY  . cloNIDine  0.2 mg Oral BID  . docusate sodium  100 mg Oral BID  . famotidine  20 mg Oral QHS  . fentaNYL  1 patch Transdermal Q72H  . hydrALAZINE  25 mg Oral Q8H  . lidocaine  1 patch Transdermal Q24H  . loratadine  10 mg Oral Daily  . magnesium citrate  1 Bottle Oral Once  . mouth rinse  15 mL Mouth Rinse BID  . mupirocin ointment  1 application Nasal BID  . naloxegol oxalate  12.5 mg Oral Daily  . pantoprazole  40 mg Oral Daily  . [START ON 10/19/2018] polyethylene glycol  17 g Oral Daily  . potassium chloride  20-40 mEq Oral Once  . rosuvastatin  20 mg Oral QHS  . senna  1 tablet Oral BID  . traMADol  50 mg Oral Q6H  . traZODone  50 mg Oral QHS  . triamcinolone ointment  1 application Topical QODAY   Continuous Infusions: PRN Meds:.alum & mag hydroxide-simeth, furosemide,  guaiFENesin-dextromethorphan, hydrALAZINE, HYDROmorphone (DILAUDID) injection, labetalol, metoprolol tartrate, morphine, nitroGLYCERIN, ondansetron, ondansetron, phenol Medications Prior to Admission:  Prior to Admission medications   Medication Sig Start Date End Date Taking? Authorizing Provider  carvedilol (COREG) 25 MG tablet TAKE 1 AND 1/2 TABLETS BY MOUTH EVERY MORNING AND TAKE 1 TABLET EVERY AFTERNOON Patient taking differently: Take 25-37.5 mg by mouth See admin instructions. Take 1 1/2 tablet (37.5 mg) by mouth at 9am and 1 tablet (25 mg) at 6pm 05/29/18  Yes Burtis Junes, NP  cetirizine (ZYRTEC) 10 MG tablet Take 10 mg by mouth daily as needed for allergies or rhinitis.    Yes [provider]  clobetasol ointment (TEMOVATE) 7.54 % Apply 1 application topically See admin instructions. Apply to both legs every other day 09/24/15  Yes [provider]  cloNIDine (CATAPRES) 0.2 MG tablet TAKE 1 TABLET BY MOUTH TWICE DAILY Patient taking differently: Take 0.2 mg by mouth 2 (two) times daily.  03/27/18  Yes Burtis Junes, NP  famotidine (PEPCID) 20 MG tablet Take 20 mg by mouth at bedtime.   Yes [provider]  furosemide (LASIX) 40 MG tablet Take 20-40 mg by mouth daily as needed for fluid or edema.    Yes [provider]  hydrALAZINE (APRESOLINE) 25 MG tablet Take 1 tablet (25 mg total) by mouth every 8 (eight) hours for 30 days. 10/09/18 11/08/18 Yes Virgel Manifold, MD  LIDOCAINE EX Place 1 patch onto the skin every 12 (twelve) hours.   Yes [provider]  nitroGLYCERIN (  NITROSTAT) 0.4 MG SL tablet Place 0.4 mg under the tongue every 5 (five) minutes as needed for chest pain.   Yes [provider]  ondansetron (ZOFRAN) 8 MG tablet Take 8 mg by mouth every 8 (eight) hours as needed for nausea or vomiting.   Yes [provider]  OVER THE COUNTER MEDICATION Take 2 tablets by mouth daily as needed (constipation). OTC laxative   Yes  [provider]  polyethylene glycol (MIRALAX / GLYCOLAX) 17 g packet Take 34-51 g by mouth daily as needed (constipation).   Yes [provider]  rosuvastatin (CRESTOR) 20 MG tablet TAKE 1 TABLET BY MOUTH AT BEDTIME Patient taking differently: Take 20 mg by mouth at bedtime.  08/01/18  Yes Burtis Junes, NP  Sodium Phosphates (FLEET ENEMA RE) Place 1 enema rectally once.   Yes [provider]  traMADol (ULTRAM) 50 MG tablet Take 1 tablet (50 mg total) by mouth every 6 (six) hours as needed. Patient taking differently: Take 50 mg by mouth every 6 (six) hours as needed (for pain).  01/13/15  Yes Burtis Junes, NP  triamcinolone ointment (KENALOG) 0.1 % Apply 1 application topically 2 (two) times daily. Patient taking differently: Apply 1 application topically See admin instructions. Apply to both legs every other day 08/05/15  Yes Bobbitt, Sedalia Muta, MD  ferrous sulfate 325 (65 FE) MG tablet Take 1 tablet (325 mg total) by mouth daily with breakfast for 30 days. Patient not taking: Reported on 10/16/2018 10/08/18 11/07/18  [place] Donnamarie Poag, DO  folic acid (FOLVITE) 1 MG tablet Take 1 tablet (1 mg total) by mouth daily. Patient not taking: Reported on 10/16/2018 01/29/15   Burtis Junes, NP   Allergies  Allergen Reactions  . Hctz [Hydrochlorothiazide] Other (See Comments)    Blacked out due to electrolytes  . Heparin Other (See Comments)    HIT positive as of 12/31/14  . Norvasc [Amlodipine Besylate] Swelling    Site of swelling not recalled  . Pletal [Cilostazol] Diarrhea   Review of Systems  Constitutional: Positive for appetite change and fatigue.  Gastrointestinal: Positive for abdominal pain and constipation.  Musculoskeletal: Positive for back pain.    Physical Exam Pulmonary:     Effort: Pulmonary effort is normal.  Abdominal:     Comments: Abdoinal hernia- not incarcerated  Musculoskeletal:     Comments: Generalized weakness  Neurological:      Mental Status: He is alert.     Comments: Easily confused during conversation     General: chronically ill gentleman, appears uncomfortable  Abd: large abdominal hernia  MSK: right low back with TTP   Vital Signs: BP (!) 180/74 (BP Location: Left Arm)   Pulse 69   Temp 98.2 F (36.8 C) (Oral)   Resp 13   Ht 5' 10"  (1.778 m)   Wt 82 kg   SpO2 100%   BMI 25.94 kg/m  Pain Scale: PAINAD   Pain Score: Asleep   SpO2: SpO2: 100 % O2 Device:SpO2: 100 % O2 Flow Rate: .O2 Flow Rate (L/min): 2 L/min  IO: Intake/output summary:   Intake/Output Summary (Last 24 hours) at 10/18/2018 1249 Last data filed at 10/18/2018 1000 Gross per 24 hour  Intake 680 ml  Output 650 ml  Net 30 ml    LBM: Last BM Date: 10/17/18 Baseline Weight: Weight: 88 kg Most recent weight: Weight: 82 kg     Palliative Assessment/Data: 30%     Thank you for this consult.  Palliative medicine will continue to follow and assist as needed.   Time In: 9:30 Time Out: 11:00 Time Total: 90 minutes Prolonged services billed: yes  Greater than 50%  of this time was spent counseling and coordinating care related to the above assessment and plan.  Signed by:  Delice Bison, DO PGY-1  Mariana Kaufman, AGNP-C Palliative Medicine    Please contact Palliative Medicine Team phone at [phone] for questions and concerns.  For individual provider: See Shea Evans

## 2018-10-19 ENCOUNTER — Telehealth: Payer: Medicare Other | Admitting: Nurse Practitioner

## 2018-10-19 DIAGNOSIS — R52 Pain, unspecified: Secondary | ICD-10-CM

## 2018-10-19 MED ORDER — MORPHINE SULFATE 15 MG PO TABS
7.5000 mg | ORAL_TABLET | ORAL | Status: DC | PRN
  Administered 2018-10-19 (×2): 7.5 mg via ORAL
  Filled 2018-10-19 (×2): qty 1

## 2018-10-19 MED ORDER — MORPHINE SULFATE 15 MG PO TABS: 7.5000 mg | ORAL_TABLET | ORAL | Status: DC | PRN

## 2018-10-19 NOTE — Progress Notes (Signed)
Daily Progress Note   Patient Name: [name]       Date: 10/19/2018 DOB: Mar 26, 1951  Age: 68 y.o. MRN#: [id number] Attending Physician: [name], * Primary Care Physician: [name] Graff.Marlou Sa, MD Admit Date: 10/16/2018  Reason for Consultation/Follow-up: Establishing goals of care  Subjective: Says he is doing okay and pain present but overall much improved.   Length of Stay: 3  Current Medications: Scheduled Meds:   carvedilol  25 mg Oral QPM   carvedilol  37.5 mg Oral q morning - 10a   Chlorhexidine Gluconate Cloth  6 each Topical Daily   clobetasol ointment  1 application Topical QODAY   cloNIDine  0.2 mg Oral BID   docusate sodium  100 mg Oral BID   famotidine  20 mg Oral QHS   fentaNYL  1 patch Transdermal Q72H   hydrALAZINE  25 mg Oral Q8H   lidocaine  1 patch Transdermal Q24H   loratadine  10 mg Oral Daily   mouth rinse  15 mL Mouth Rinse BID   mupirocin ointment  1 application Nasal BID   naloxegol oxalate  12.5 mg Oral Daily   pantoprazole  40 mg Oral Daily   polyethylene glycol  17 g Oral Daily   potassium chloride  20-40 mEq Oral Once   rosuvastatin  20 mg Oral QHS   senna  1 tablet Oral BID   traMADol  50 mg Oral Q6H   traZODone  50 mg Oral QHS   triamcinolone ointment  1 application Topical QODAY    Continuous Infusions:   PRN Meds: alum & mag hydroxide-simeth, furosemide, guaiFENesin-dextromethorphan, hydrALAZINE, HYDROmorphone (DILAUDID) injection, labetalol, metoprolol tartrate, morphine CONCENTRATE, nitroGLYCERIN, ondansetron, ondansetron, phenol  Physical Exam Vitals signs and nursing note reviewed.  Constitutional:      General: He is awake.     Appearance: He is ill-appearing.  Cardiovascular:     Rate and  Rhythm: Normal rate.  Pulmonary:     Effort: Pulmonary effort is normal. No tachypnea, accessory muscle usage or respiratory distress.  Abdominal:     Palpations: Abdomen is soft.     Comments: LBM 5/26  Neurological:     Mental Status: He is alert and oriented to person, place, and time.  Vital Signs: BP (!) 156/75    Pulse 70    Temp 98.5 F (36.9 C) (Oral)    Resp 14    Ht 5\' 10"  (1.778 m)    Wt 82 kg    SpO2 98%    BMI 25.94 kg/m  SpO2: SpO2: 98 % O2 Device: O2 Device: Room Air O2 Flow Rate: O2 Flow Rate (L/min): 2 L/min  Intake/output summary:   Intake/Output Summary (Last 24 hours) at 10/19/2018 1059 Last data filed at 10/19/2018 0407 Gross per 24 hour  Intake 120 ml  Output 875 ml  Net -755 ml   LBM: Last BM Date: 10/17/18 Baseline Weight: Weight: 88 kg Most recent weight: Weight: 82 kg       Palliative Assessment/Data: 30%   Flowsheet Rows     Most Recent Value  Intake Tab  Referral Department  Surgery  Unit at Time of Referral  Cardiac/Telemetry Unit  Palliative Care Primary Diagnosis  Cardiac  Date Notified  10/18/18  Palliative Care Type  New Palliative care  Reason for referral  Clarify Goals of Care  Date of Admission  10/16/18  Date first seen by Palliative Care  10/18/18  # of days Palliative referral response time  0 Day(s)  # of days IP prior to Palliative referral  2  Clinical Assessment  Psychosocial & Spiritual Assessment  Palliative Care Outcomes      Patient Active Problem List   Diagnosis Date Noted   Goals of care, counseling/discussion    Palliative care by specialist    AAA (abdominal aortic aneurysm) without rupture ([place]) 16/02/9603   Umbilical hernia without obstruction and without gangrene    Epistaxis 02/25/2017   Urticaria with associated angioedema 08/05/2015   Angioedema 07/22/2015   Advanced care planning/counseling discussion 01/08/2015   Acute pulmonary embolism ([place]) 01/03/2015   Acute DVT (deep  venous thrombosis) ([facility]) 12/27/2014   S/P CABG x 5 12/13/14 12/27/2014   Anemia 12/27/2014   Acute renal failure ([place]) 12/27/2014   AKI (acute kidney injury) ([facility])    Absolute anemia    CAD (coronary artery disease) 12/13/2014   NSTEMI (non-ST elevated myocardial infarction) ([place]) 12/09/2014   Hyponatremia    Unstable angina pectoris ([place]) 12/07/2014   Thoracic aneurysm without mention of rupture 04/14/2012   Intermittent claudication ([place]) 03/10/2012   Atherosclerosis of native arteries of extremity with intermittent claudication ([place]) 10/08/2011   Abdominal aortic aneurysm (AAA) greater than 5.5 cm in diameter in male ([place]) 09/07/2011   Carotid stenosis, bilateral 09/07/2011   Atherosclerosis of native coronary artery with unstable angina pectoris ([place]) 01/03/2011   Essential hypertension, benign 01/03/2011   Hyperlipidemia with target LDL less than 70 01/03/2011   PAD (peripheral artery disease) ([place]) 01/03/2011    Palliative Care Assessment & Plan   HPI: 68 y.o. male  with past medical history of an 8 cm infrarenal abdominal aortic aneurysm (recently increased from 4/9 cm), CAD s/p CABG x5 in 5409, chronic systolic heart failure, stage IV CKD, HTN, OA, admitted on 10/16/2018 with acute abdominal pain. Patient diagnosed with AAA in 2012 which has progressively enlarged and become more symptomatic in the last year. Given the complexity of the aneurysm and multiple medical comorbidities, he is in the very high risk category for elective repair. He and his wife do not wish to pursue repair.     Assessment: [name] is happy with fentanyl patch claiming that he has better relief the past 24 hours than he has had in  a long time. He understands that his pain will not be eliminated but the goal is to minimize pain to tolerable to hopefully have better functional status and QOL. He also wants to maintain alertness as much as possible. Pain from chronic low back pain  complicated by constipation and growing AAA. He is hopeful to go home soon and understands that hospice will assist with symptom management at home to optimize his QOL.   I called and spoke with wife, [name], as there was some confusion for palliative vs hospice services at home. I explained that outpatient palliative services are very limited and not managing pain or writing prescriptions at this time. I recommended that hospice would be the better option given his worsening symptom burden (their primary goal is to minimize symptoms) and functions status along with growing AAA. She had many questions and appeared very confused by the information she has received thus far. At this time she says this makes a little more sense to her and she agrees that hospice would be the better option for them at this time.   Recommendations/Plan:  Home with hospice.   Pain management (chronic low back pain complicated by AAA): Fentanyl patch continue at 12 mcg/hr. Tramadol 50 mg every 6 hours mod pain (little relief and should consider d/c). Morphine IR 7.5 every 2 hours prn breakthrough pain.   Bowel Regimen: Miralax daily. Senokot 1 tablet BID.   Code Status:  DNR  Prognosis:   < 6 months very likely  Discharge Planning:  Home with Hospice  Thank you for allowing the Palliative Medicine Team to assist in the care of this patient.   Total Time 55 min Prolonged Time Billed  no       Greater than 50%  of this time was spent counseling and coordinating care related to the above assessment and plan.  Vinie Sill, NP Palliative Medicine Team Pager # [phone] (M-F 8a-5p) Team Phone # [phone] (Nights/Weekends)

## 2018-10-19 NOTE — TOC Transition Note (Signed)
Transition of Care [facility]) - CM/SW Discharge Note   Patient Details  Name: [name] MRN: [id number] Date of Birth: Apr 27, 1951  [facility]) CM/SW Contact:  Carles Collet, RN Phone Number: 10/19/2018, 3:07 PM   Clinical Narrative:    Damaris Schooner w wife again, she would like to home hospice through [facility]. Anderson Malta w [facility] is also aware of this as she has been in contact with her today. There are no DME needs and wife will be able to provide transport home. Anderson Malta states a nurse will be able to see him at home tomorrow.     Final next level of care: Home w Hospice Care Barriers to Discharge: No Barriers Identified   Patient Goals and CMS Choice Patient states their goals for this hospitalization and ongoing recovery are:: to return home CMS Medicare.gov Compare Post Acute Care list provided to:: Patient Represenative (must comment)(wife)    Discharge Placement                       Discharge Plan and Services   Discharge Planning Services: CM Consult                        [facility] Agency: [facility](Palliative Services) Date [place]: 10/19/18 Time [place]: 6067 Representative spoke with at [place]: Venia Carbon  Social Determinants of Health (SDOH) Interventions     Readmission Risk Interventions No flowsheet data found.

## 2018-10-19 NOTE — TOC Initial Note (Signed)
Transition of Care [facility]) - Initial/Assessment Note    Patient Details  Name: [name] MRN: [id number] Date of Birth: 1950/07/28  [facility]) CM/SW Contact:    Carles Collet, RN Phone Number: 10/19/2018, 10:11 AM  Clinical Narrative:             Referral completed to [place] for home palliative services as discussed with wife. CM will continue to follow       Expected Discharge Plan: Home/Self Care Barriers to Discharge: Continued Medical Work up   Patient Goals and CMS Choice Patient states their goals for this hospitalization and ongoing recovery are:: to return home CMS Medicare.gov Compare Post Acute Care list provided to:: Patient Represenative (must comment)(wife)    Expected Discharge Plan and Services Expected Discharge Plan: Home/Self Care   Discharge Planning Services: CM Consult   Living arrangements for the past 2 months: Single Family Home Expected Discharge Date: 10/20/18                           [facility] Agency: [facility] [place] [facility] [place](Palliative Services) Date [facility] Agency Contacted: 10/19/18 Time HH Agency Contacted: 1011 Representative spoke with at [place]: Venia Carbon  Prior Living Arrangements/Services Living arrangements for the past 2 months: [place] Lives with:: Spouse Patient language and need for interpreter reviewed:: Yes Do you feel safe going back to the place where you live?: Yes          Current home services: DME Criminal Activity/Legal Involvement Pertinent to Current Situation/Hospitalization: No - Comment as needed  Activities of Daily Living Home Assistive Devices/Equipment: [place] Rile (specify type) ADL Screening (condition at time of admission) Patient's cognitive ability adequate to safely complete daily activities?: Yes Is the patient deaf or have difficulty hearing?: No Does the patient have difficulty seeing, even when wearing glasses/contacts?: No Does the patient have  difficulty concentrating, remembering, or making decisions?: No Patient able to express need for assistance with ADLs?: Yes Does the patient have difficulty dressing or bathing?: No Independently performs ADLs?: Yes (appropriate for developmental age) Does the patient have difficulty walking or climbing stairs?: No Weakness of Legs: Both Weakness of Arms/Hands: None  Permission Sought/Granted                  Emotional Assessment              Admission diagnosis:  Preop examination [Z01.818] Abdominal aortic aneurysm (AAA) greater than 5.5 cm in diameter in male [facility]) [I71.4] AAA (abdominal aortic aneurysm) without rupture ([place]) [I71.4] Patient Active Problem List   Diagnosis Date Noted  . Goals of care, counseling/discussion   . Palliative care by specialist   . AAA (abdominal aortic aneurysm) without rupture ([place]) 10/16/2018  . Umbilical hernia without obstruction and without gangrene   . Epistaxis 02/25/2017  . Urticaria with associated angioedema 08/05/2015  . Angioedema 07/22/2015  . Advanced care planning/counseling discussion 01/08/2015  . Acute pulmonary embolism ([place]) 01/03/2015  . Acute DVT (deep venous thrombosis) ([place]) 12/27/2014  . S/P CABG x 5 12/13/14 12/27/2014  . Anemia 12/27/2014  . Acute renal failure ([place]) 12/27/2014  . AKI (acute kidney injury) ([place])   . Absolute anemia   . CAD (coronary artery disease) 12/13/2014  . NSTEMI (non-ST elevated myocardial infarction) ([place]) 12/09/2014  . Hyponatremia   . Unstable angina pectoris ([place]) 12/07/2014  . Thoracic aneurysm without mention of rupture 04/14/2012  . Intermittent claudication ([place]) 03/10/2012  .  Atherosclerosis of native arteries of extremity with intermittent claudication ([place]) 10/08/2011  . Abdominal aortic aneurysm (AAA) greater than 5.5 cm in diameter in male ([place]) 09/07/2011  . Carotid stenosis, bilateral 09/07/2011  . Atherosclerosis of native coronary artery with unstable angina  pectoris ([place]) 01/03/2011  . Essential hypertension, benign 01/03/2011  . Hyperlipidemia with target LDL less than 70 01/03/2011  . PAD (peripheral artery disease) ([place]) 01/03/2011   PCP:  Alroy Dust, L.Marlou Sa, MD Pharmacy:   [facility] [place], [place] - [address] [place] [address] Phone: [phone] Fax: [phone]  [facility] [place], [place] - [address] [place] [address] Phone: [phone] Fax: [phone]     Social Determinants of Health (SDOH) Interventions    Readmission Risk Interventions No flowsheet data found.

## 2018-10-19 NOTE — Care Management Important Message (Signed)
Important Message  Patient Details  Name: [name] MRN: [id number] Date of Birth: 1950-06-02   Medicare Important Message Given:  Yes    Orbie Pyo 10/19/2018, 3:25 PM

## 2018-10-19 NOTE — Progress Notes (Addendum)
[facility] [place]  Subjective  - He feels he is ready to go home today, states his pain is a little better.   Objective 140/76 70 98.5 F (36.9 C) (Oral) 14 95%  Intake/Output Summary (Last 24 hours) at 10/19/2018 0703 Last data filed at 10/19/2018 0407 Gross per 24 hour  Intake 600 ml  Output 1225 ml  Net -625 ml   Moving all 4 ext with intact sensation, feet warm and well perfused Abdomin palpable AAA pulse Lungs non labored breathing     Assessment/Planning: 68 y.o. male with large AAA, chronic back pain  Palliative care consult performed yesterday SUMMARY OF RECOMMENDATIONS - CODE status changed to DNR - plan for home with hospice services at discharge - pain management: Fentanyl patch 12 mcg/hr with liquid morphine 10 mg q 2 hrs for breakthrough - Will continue prn IV dilaudid for now- but recommend to encourage use of PO morphine first and d/c IV dilaudid tomorrow in preparation for d/c home  - bowel regimen: change Miralax to daily added Senokot 1 tab BID - Trazodone 50 mg qhs for sleep   He states he is ready to go home today. Cr 3.6 baseline 2.0 at admission known CKD  Roxy Horseman 10/19/2018 7:03 AM --  Laboratory Lab Results: Recent Labs    10/16/18 1949  WBC 11.1*  HGB 9.3*  HCT 27.7*  PLT 154   BMET Recent Labs    10/16/18 1949 10/18/18 0744  NA 136 136  K 4.3 4.2  CL 106 106  CO2 18* 17*  GLUCOSE 109* 100*  BUN 68* 64*  CREATININE 3.62* 3.60*  CALCIUM 9.3 8.9    COAG Lab Results  Component Value Date   INR 0.98 02/25/2017   INR 1.8 07/14/2015   INR 2.7 06/11/2015   No results found for: PTT

## 2018-10-19 NOTE — Progress Notes (Addendum)
[facility]) Palliative  Received referral for palliative services in the community once discharged.    Left message for Gae Bon (wife) with contact information and requested return call to set up services once home.  [facility] will continue to follow and anticipate d/c date.  **Update, PMT spoke with wife and she would like hospice services--she is somewhat confused with all of the new information.  [facility] will send a RN out to determine hospice eligibility, or should wife decide palliative.  Will schedule RN for when pt d/c's home.  Thank you, Venia Carbon RN, BSN, [facility] Liaison (in [place] under Hospice and [facility]) [phone] (main #)

## 2018-10-19 NOTE — Progress Notes (Signed)
Spoke with wife on phone and she was updated on patient.

## 2018-10-20 MED ORDER — TRAZODONE HCL 50 MG PO TABS: 50.0000 mg | ORAL_TABLET | Freq: Every day | ORAL | 0 refills | Status: AC

## 2018-10-20 MED ORDER — FENTANYL 12 MCG/HR TD PT72: 1.0000 | MEDICATED_PATCH | TRANSDERMAL | 0 refills | Status: AC

## 2018-10-20 MED ORDER — MORPHINE SULFATE 15 MG PO TABS: 7.5000 mg | ORAL_TABLET | ORAL | 0 refills | Status: AC | PRN

## 2018-10-20 MED ORDER — SENNA 8.6 MG PO TABS: 1.0000 | ORAL_TABLET | Freq: Two times a day (BID) | ORAL | 0 refills | Status: AC

## 2018-10-20 MED FILL — fentaNYL 12 MCG/HR PT72: 12 | 15 days supply | Qty: 5 | Fill #0

## 2018-10-20 MED FILL — MORPHINE SULFATE 15 MG TABS: 15 | 7 days supply | Qty: 30 | Fill #0

## 2018-10-20 MED FILL — traZODone HCL 50 MG TABS: 50 | 30 days supply | Qty: 30 | Fill #0

## 2018-10-20 MED FILL — SENNA 8.6 MG TABS: 8.6 | 60 days supply | Qty: 120 | Fill #0

## 2018-10-20 NOTE — Consult Note (Signed)
   [facility] CM Inpatient Consult   10/20/2018  [name] 1950/10/02 [id number]    Patient screened if potential [facility] services needed bythis Medicare/ [employer] memberwith19% medium risk for unplanned readmission. Noted readmission within 30 days, 2 hospitalizations and 1 ED visit in the past 6 months.  Per chart review andhistory and physical on 10/16/18 show as follows: [name] is a pleasant 68 y.o. male, who presented to the emergency department with abdominal pain (AAA with back pain). He has a centimeter abdominal aortic aneurysm considered to be non-operable by [facility] vascular surgeon Dr. Sammuel Hines.  He was diagnosed with large AAA about 8 years ago and has decided against any type of surgical repair.  He has had an increase in his chronic back pain and there was concern for partial rupture.  Patient elected not to consider repair because of the risk involved.  In addition to the technical aspects related to the calcium patient has COPD, CHF, coronary artery disease, and chronic kidney disease.  These all put him at very high risk for open surgery and is clearly not a candidate for an endovascular approach.  On his recent hospital visit for hypertension, his aneurysm was discussed with the patient and at that point made it clear that he did not want to consider repair.   The patient has significant renal insufficiency but apparently is not followed by nephrology. Patient has had previous bilateral carotid endarterectomy and also bilateral iliac stents by Dr. Drucie Opitz.  Patient's primarycareprovider isDr. Alroy Dust L. Dean with [facility], listed as providing transition of care follow-up.  Noted Palliative care consult, with patient and his wife determination was made for home hospice for symptom management.    Review of transition of care team note, shows that patient's wife declined home health services, (has  [facility] prior to admission) but states that she will be with patient 24/7 and his main concern is constipation and pain. Current disposition is home with hosp[facility] care [facility].  There are no identified [facility] follow-up needs at this point, patient will have full care under Hospice.    For questions and additional information, please call:  Genese Quebedeaux A. Markelle Najarian, BSN, RN-BC [facility] Liaison Cell: [phone]

## 2018-10-20 NOTE — Progress Notes (Signed)
Patient was discharged today. Patient was picked up by his wife. Patient left with all of his belongings.  IV was removed and telemetry ended.  AVS was reviewed with patient and his wife, all questions were answered.

## 2018-10-20 NOTE — Discharge Summary (Addendum)
Physician Discharge Summary   Patient ID: [name] [id number] 68 y.o. [birth year]  Admit date: 10/16/2018  Discharge date and time: 10/20/18   Admitting Physician: Angelia Mould, MD   Discharge Physician: Dr. Donnetta Hutching  Admission Diagnoses: Preop examination [Z01.818] Abdominal aortic aneurysm (AAA) greater than 5.5 cm in diameter in male [facility]) [I71.4] AAA (abdominal aortic aneurysm) without rupture [facility]) [I71.4]  Discharge Diagnoses: same  Admission Condition: poor  Discharged Condition: poor  Indication for Admission: AAA with back pain  Hospital Course: [name] is a 68 year old male with a centimeter abdominal aortic aneurysm considered to be nonoperable by [facility] vascular surgeon Dr. Sammuel Hines.  He was diagnosed with large AAA about 8 years ago and has decided against any type of surgical repair.  He has had an increase in his chronic back pain and there was concern for partial rupture.  Palliative care was consulted and after discussions with the patient and his wife the determination was made for home hospice for symptom management.  Palliative care recommendations included trazodone for sleep, senna for constipation, and fentanyl patch, morphine IR, and tramadol for pain management.  Case manager arranged home hospice nurse to meet with the patient and his wife today at home after discharge.  Discharge instructions were reviewed with the patient today and he voices understanding.  It should also be noted that CODE STATUS was changed to DNR during hospitalization.  He will be discharged this morning in stable condition for home hospice.  Consults: palliative care  Discharge Exam: back pain manageable with current regimen Vitals:   10/20/18 0431 10/20/18 0500  BP: (!) 143/83   Pulse:    Resp: 11 (!) 8  Temp:    SpO2:     Cardiac:  RRR Lungs:  Non labored Extremities:  Feet symmetrically warm to touch Abdomen:  Generalized tenderness to deep  palpation Neurologic: A&O   Disposition: Home hospice  Patient Instructions:  Allergies as of 10/20/2018      Reactions   Hctz [hydrochlorothiazide] Other (See Comments)   Blacked out due to electrolytes   Heparin Other (See Comments)   HIT positive as of 12/31/14   Norvasc [amlodipine Besylate] Swelling   Site of swelling not recalled   Pletal [cilostazol] Diarrhea      Medication List    TAKE these medications   carvedilol 25 MG tablet Commonly known as:  COREG TAKE 1 AND 1/2 TABLETS BY MOUTH EVERY MORNING AND TAKE 1 TABLET EVERY AFTERNOON What changed:    how much to take  how to take this  when to take this  additional instructions   cetirizine 10 MG tablet Commonly known as:  ZYRTEC Take 10 mg by mouth daily as needed for allergies or rhinitis.   clobetasol ointment 0.05 % Commonly known as:  TEMOVATE Apply 1 application topically See admin instructions. Apply to both legs every other day   cloNIDine 0.2 MG tablet Commonly known as:  CATAPRES TAKE 1 TABLET BY MOUTH TWICE DAILY   fentaNYL 12 MCG/HR Commonly known as:  [place] 1 patch onto the skin every 3 (three) days. Start taking on:  Oct 21, 2018   ferrous sulfate 325 (65 FE) MG tablet Take 1 tablet (325 mg total) by mouth daily with breakfast for 30 days.   FLEET ENEMA RE Place 1 enema rectally once.   folic acid 1 MG tablet Commonly known as:  FOLVITE Take 1 tablet (1 mg total) by mouth daily.  furosemide 40 MG tablet Commonly known as:  LASIX Take 20-40 mg by mouth daily as needed for fluid or edema.   hydrALAZINE 25 MG tablet Commonly known as:  APRESOLINE Take 1 tablet (25 mg total) by mouth every 8 (eight) hours for 30 days.   LIDOCAINE EX Place 1 patch onto the skin every 12 (twelve) hours.   morphine 15 MG tablet Commonly known as:  MSIR Take 0.5 tablets (7.5 mg total) by mouth every 2 (two) hours as needed for severe pain.   nitroGLYCERIN 0.4 MG SL tablet Commonly  known as:  NITROSTAT Place 0.4 mg under the tongue every 5 (five) minutes as needed for chest pain.   ondansetron 8 MG tablet Commonly known as:  ZOFRAN Take 8 mg by mouth every 8 (eight) hours as needed for nausea or vomiting.   OVER THE COUNTER MEDICATION Take 2 tablets by mouth daily as needed (constipation). OTC laxative   Pepcid 20 MG tablet Generic drug:  famotidine Take 20 mg by mouth at bedtime.   polyethylene glycol 17 g packet Commonly known as:  MIRALAX / GLYCOLAX Take 34-51 g by mouth daily as needed (constipation).   rosuvastatin 20 MG tablet Commonly known as:  CRESTOR TAKE 1 TABLET BY MOUTH AT BEDTIME   senna 8.6 MG Tabs tablet Commonly known as:  SENOKOT Take 1 tablet (8.6 mg total) by mouth 2 (two) times daily.   traMADol 50 MG tablet Commonly known as:  Ultram Take 1 tablet (50 mg total) by mouth every 6 (six) hours as needed. What changed:  reasons to take this   traZODone 50 MG tablet Commonly known as:  DESYREL Take 1 tablet (50 mg total) by mouth at bedtime.   triamcinolone ointment 0.1 % Commonly known as:  KENALOG Apply 1 application topically 2 (two) times daily. What changed:    when to take this  additional instructions      Activity: activity as tolerated Diet: regular diet Wound Care: none needed  Signed: Dagoberto Ligas 10/20/2018 7:27 AM

## 2018-10-20 NOTE — Progress Notes (Signed)
   VASCULAR SURGERY ASSESSMENT & PLAN:   8 CM ABDOMINAL AORTIC ANEURYSM: The patient does not wish repair.  He is extremely high risk for reasons.  He is to be discharged today to home hospice.  He is in agreement that if the aneurysm is ruptured there will be no attempt to repair this.  SUBJECTIVE:   Moderate back pain which is stable.  PHYSICAL EXAM:   Vitals:   10/20/18 0404 10/20/18 0431 10/20/18 0500 10/20/18 0826  BP: (!) 166/71 (!) 143/83  (!) 172/72  Pulse: 63   68  Resp: (!) 9 11 (!) 8   Temp: 98.1 F (36.7 C)   98.3 F (36.8 C)  TempSrc: Oral   Oral  SpO2: 99%     Weight:   83.1 kg   Height:       Abdomen nontender.  LABS:   Lab Results  Component Value Date   WBC 11.1 (H) 10/16/2018   HGB 9.3 (L) 10/16/2018   HCT 27.7 (L) 10/16/2018   MCV 98.9 10/16/2018   PLT 154 10/16/2018   Lab Results  Component Value Date   CREATININE 3.60 (H) 10/18/2018   Lab Results  Component Value Date   INR 0.98 02/25/2017    PROBLEM LIST:    Active Problems:   Advanced care planning/counseling discussion   AAA (abdominal aortic aneurysm) without rupture (HCC)   Goals of care, counseling/discussion   Palliative care by specialist   CURRENT MEDS:   . carvedilol  25 mg Oral QPM  . carvedilol  37.5 mg Oral q morning - 10a  . Chlorhexidine Gluconate Cloth  6 each Topical Daily  . clobetasol ointment  1 application Topical QODAY  . cloNIDine  0.2 mg Oral BID  . docusate sodium  100 mg Oral BID  . famotidine  20 mg Oral QHS  . fentaNYL  1 patch Transdermal Q72H  . hydrALAZINE  25 mg Oral Q8H  . lidocaine  1 patch Transdermal Q24H  . loratadine  10 mg Oral Daily  . mouth rinse  15 mL Mouth Rinse BID  . mupirocin ointment  1 application Nasal BID  . naloxegol oxalate  12.5 mg Oral Daily  . pantoprazole  40 mg Oral Daily  . polyethylene glycol  17 g Oral Daily  . potassium chloride  20-40 mEq Oral Once  . rosuvastatin  20 mg Oral QHS  . senna  1 tablet Oral BID  .  traMADol  50 mg Oral Q6H  . traZODone  50 mg Oral QHS  . triamcinolone ointment  1 application Topical [place]: [phone] Office: [phone] 10/20/2018

## 2018-10-23 ENCOUNTER — Telehealth: Payer: Self-pay | Admitting: Nurse Practitioner

## 2018-10-23 ENCOUNTER — Telehealth: Payer: Self-pay | Admitting: *Deleted

## 2018-10-23 MED ORDER — HYDRALAZINE HCL 25 MG PO TABS: 25.0000 mg | ORAL_TABLET | Freq: Two times a day (BID) | ORAL | 0 refills | Status: AC

## 2018-10-23 NOTE — Telephone Encounter (Signed)
Sheri calling from hospice to confirm medication change. Dr. Donnie Coffin will be the attending physician for Hospice and Evie Lacks will be pt's nurse.

## 2018-10-23 NOTE — Telephone Encounter (Signed)
Virtual Visit Pre-Appointment Phone Call  "(Name), I am calling you today to discuss your upcoming appointment. We are currently trying to limit exposure to the virus that causes COVID-19 by seeing patients at home rather than in the office."  1. "What is the BEST phone number to call the day of the visit?" - include this in appointment notes  2. "Do you have or have access to (through a family member/friend) a smartphone with video capability that we can use for your visit?" a. If yes - list this number in appt notes as "cell" (if different from BEST phone #) and list the appointment type as a VIDEO visit in appointment notes b. If no - list the appointment type as a PHONE visit in appointment notes  3. Confirm consent - "In the setting of the current Covid19 crisis, you are scheduled for a (phone or video) visit with your provider on(Wednesday, June 3) at (8:30 am).  Just as we do with many in-office visits, in order for you to participate in this visit, we must obtain consent.  If you'd like, I can send this to your mychart (if signed up) or email for you to review.  Otherwise, I can obtain your verbal consent now.  All virtual visits are billed to your insurance company just like a normal visit would be.  By agreeing to a virtual visit, we'd like you to understand that the technology does not allow for your provider to perform an examination, and thus may limit your provider's ability to fully assess your condition. If your provider identifies any concerns that need to be evaluated in person, we will make arrangements to do so.  Finally, though the technology is pretty good, we cannot assure that it will always work on either your or our end, and in the setting of a video visit, we may have to convert it to a phone-only visit.  In either situation, we cannot ensure that we have a secure connection.  Are you willing to proceed?" STAFF: Did the patient verbally acknowledge consent to telehealth  visit? Document YES/NO here: YES.   4. Advise patient to be prepared - "Two hours prior to your appointment, go ahead and check your blood pressure, pulse, oxygen saturation, and your weight (if you have the equipment to check those) and write them all down. When your visit starts, your provider will ask you for this information. If you have an Apple Watch or Kardia device, please plan to have heart rate information ready on the day of your appointment. Please have a pen and paper handy nearby the day of the visit as well."  5. Give patient instructions for MyChart download to smartphone OR Doximity/Doxy.me as below if video visit (depending on what platform provider is using)  6. Inform patient they will receive a phone call 15 minutes prior to their appointment time (may be from unknown caller ID) so they should be prepared to answer    TELEPHONE CALL NOTE  [name] has been deemed a candidate for a follow-up tele-health visit to limit community exposure during the Covid-19 pandemic. I spoke with the patient via phone to ensure availability of phone/video source, confirm preferred email & phone number, and discuss instructions and expectations.  I reminded [name] to be prepared with any vital sign and/or heart rhythm information that could potentially be obtained via home monitoring, at the time of his visit. I reminded [name] to expect a phone  call prior to his visit.  Danielle Avanell Shackleton 10/23/2018 11:15 AM   INSTRUCTIONS FOR DOWNLOADING THE MYCHART APP TO SMARTPHONE  - The patient must first make sure to have activated MyChart and know their login information - If Apple, go to [employer] and type in MyChart in the search bar and download the app. If Android, ask patient to go to [employer] and type in [place] in the search bar and download the app. The app is free but as with any other app downloads, their phone may require them to verify saved payment  information or Apple/Android password.  - The patient will need to then log into the app with their MyChart username and password, and select [facility] as their healthcare provider to link the account. When it is time for your visit, go to the MyChart app, find appointments, and click Begin Video Visit. Be sure to Select Allow for your device to access the Microphone and Camera for your visit. You will then be connected, and your provider will be with you shortly.  **If they have any issues connecting, or need assistance please contact MyChart service desk [phone])**  **If using a computer, in order to ensure the best quality for their visit they will need to use either of the following Internet Browsers: [employer], or Google Chrome**  IF USING DOXIMITY or DOXY.ME - The patient will receive a link just prior to their visit by text.     FULL LENGTH CONSENT FOR TELE-HEALTH VISIT   I hereby voluntarily request, consent and authorize [facility] and its employed or contracted physicians, physician assistants, nurse practitioners or other licensed health care professionals (the Practitioner), to provide me with telemedicine health care services (the "Services") as deemed necessary by the treating Practitioner. I acknowledge and consent to receive the Services by the Practitioner via telemedicine. I understand that the telemedicine visit will involve communicating with the Practitioner through live audiovisual communication technology and the disclosure of certain medical information by electronic transmission. I acknowledge that I have been given the opportunity to request an in-person assessment or other available alternative prior to the telemedicine visit and am voluntarily participating in the telemedicine visit.  I understand that I have the right to withhold or withdraw my consent to the use of telemedicine in the course of my care at any time, without affecting my right  to future care or treatment, and that the Practitioner or I may terminate the telemedicine visit at any time. I understand that I have the right to inspect all information obtained and/or recorded in the course of the telemedicine visit and may receive copies of available information for a reasonable fee.  I understand that some of the potential risks of receiving the Services via telemedicine include:  Marland Kitchen Delay or interruption in medical evaluation due to technological equipment failure or disruption; . Information transmitted may not be sufficient (e.g. poor resolution of images) to allow for appropriate medical decision making by the Practitioner; and/or  . In rare instances, security protocols could fail, causing a breach of personal health information.  Furthermore, I acknowledge that it is my responsibility to provide information about my medical history, conditions and care that is complete and accurate to the best of my ability. I acknowledge that Practitioner's advice, recommendations, and/or decision may be based on factors not within their control, such as incomplete or inaccurate data provided by me or distortions of diagnostic images or specimens that may result from  electronic transmissions. I understand that the practice of medicine is not an exact science and that Practitioner makes no warranties or guarantees regarding treatment outcomes. I acknowledge that I will receive a copy of this consent concurrently upon execution via email to the email address I last provided but may also request a printed copy by calling the office of [facility].    I understand that my insurance will be billed for this visit.   I have read or had this consent read to me. . I understand the contents of this consent, which adequately explains the benefits and risks of the Services being provided via telemedicine.  . I have been provided ample opportunity to ask questions regarding this consent and the Services  and have had my questions answered to my satisfaction. . I give my informed consent for the services to be provided through the use of telemedicine in my medical care  By participating in this telemedicine visit I agree to the above.

## 2018-10-23 NOTE — Telephone Encounter (Signed)
Sheri from hospice stated pt's bp readings were 76/48 and 82/48 today, pt is lethargic and dizzy.  lvm pt can reduce hydralazine  (25 mg) bid, pt cannot just d/c to to rebound htn. Advised with Truitt Merle, NP.  inquiring who is in charge of pts hospice orders??

## 2018-10-23 NOTE — Telephone Encounter (Signed)
° ° °  Sheri from [facility] calling to discuss medication changes

## 2018-10-24 NOTE — Progress Notes (Signed)
Telehealth Visit     Virtual Visit via Video Note   This visit type was conducted due to national recommendations for restrictions regarding the COVID-19 Pandemic (e.g. social distancing) in an effort to limit this patient's exposure and mitigate transmission in our community.  Due to his co-morbid illnesses, this patient is at least at moderate risk for complications without adequate follow up.  This format is felt to be most appropriate for this patient at this time.  All issues noted in this document were discussed and addressed.  A limited physical exam was performed with this format.  Please refer to the patient's chart for his consent to telehealth for [name].   Evaluation Performed:  Follow-up visit  This visit type was conducted due to national recommendations for restrictions regarding the COVID-19 Pandemic (e.g. social distancing).  This format is felt to be most appropriate for this patient at this time.  All issues noted in this document were discussed and addressed.  No physical exam was performed (except for noted visual exam findings with Video Visits).  Please refer to the patient's chart (MyChart message for video visits and phone note for telephone visits) for the patient's consent to telehealth for [name]  Date:  10/25/2018   ID:  [name], DOB 1950/07/06, MRN [id number]  Patient Location:  Home  Provider location:   Home  PCP:  Alroy Dust, L.Marlou Sa, MD  Cardiologist:  Jerel Shepherd Electrophysiologist:  None   Chief Complaint:  Post hospital/follow up.   History of Present Illness:    [name] is a 68 y.o. male who presents via audio/video conferencing for a telehealth visit today.  Seen for Dr. Burt Knack. Former patient of Dr. Susa Simmonds. Primarily follows with me.   He has an extensive history of PVD and CAD. He was previously followed by [facility]. Bridgett Larsson at [facility] and then went to Vascular in [facility] with Dr. Sammuel Hines. Prior cath in 2006 showing  normal LV function with a totally occluded RCA with left to right collaterals. He has distal small vessel disease in the RCA with diffuse mild to moderate stenosis in the LAD. Former smoker. His PVD history is quite extensive and includes AAA (with no plans to intervene due to the nature of his aneurysm and his multiple comorbidities).    Other problems include HTN, HLD and OA. He has a history of hyponatremia as well.He had CABG x 5 by Dr. Cyndia Bent back in 2426 - complicated by DVT and volume overload.Was on IV heparin, transitioned to Xarelto due to drop in platelets, HIT panel + and serotonin release assay + as well and he was changed to Arixtra and seen by Dr. Alen Blew. Warfarin started and he wason 6 months of therapyand then transitioned back to Plavix..   I have seen him several times over the past few years. He has had multiple issues which have included BP lability, volume overload/systolic HF, severe epistaxis, ongoing alcohol use, enlarging aneurysm with no plans to intervene, chronic back pain, enlarging umbilical hernia, lower GI bleeding, anemia etc. He is no longer on Plavix. He has become more limited by back pain. He has basically been trying to do "his bucket list" with no plans for surgical intervention. Last seen in January - lots of physical complaints at that time but overall felt to be stable.   He has had 3 admissions in the past month for back pain - just discharged 10/20/2018 - has been seen by Dr. Trula Slade - concern  for his aneurysm rupturing - he has been placed on narcotics, has required additional meds for BP control, has had significant constipation - has ended up being referred to Hospice. He is now a DNR.  His aneurysm is measuring 8cm. CT was concerning for impending rupture.   The patient does not have symptoms concerning for COVID-19 infection (fever, chills, cough, or new shortness of breath).   Seen today by a phone conversation - 27 minutes on the phone with her. We  were going to do Facetime video but on the pre call - she is quite upset - patient is incoherent now. They previously consented for this visit. Wife provides all of the history. She is very overwhelmed and frustrated with the whole situation - especially regarding [name] pain control. We have already had to cut the Hydralazine back due to low BP since he has been home. Dr. Alroy Dust is going to be the primary for his Hospice care. I am happy to help anyway possible. He was ambulatory when he first got home, had started eating, bowels were moving etc. She placed his Fentanyl patch - then he got "goofy". BP was low. Now can't get out of bed. He fell trying to go to the bathroom. He is confused. Now with terrible abdominal pain due to constipation again. BP has gone back up again. The nurse came yesterday. Finally got his bowels going - but he is still confused - babbling - he cannot stand up. Not really using the prn Morphine. She really wants to take care of him at home but she is not going to be able to if he is bedridden - she is not prepared for him to die this way if bedridden. She is not aware of a possibility of [employer]. She is wanting to cut back narcotics. She is wanting to set up a room for him in their sunroom. She knows she is going to need more help for his physical care. She is very tearful and quite overwhelmed. She does have a call into Hospice.   Past Medical History:  Diagnosis Date   AAA (abdominal aortic aneurysm) (HCC)    Anxiety    CHF (congestive heart failure) (HCC)    COPD (chronic obstructive pulmonary disease) (HCC)    Coronary artery disease    GERD (gastroesophageal reflux disease)    History of blood transfusion    Hyperlipidemia    Hypertension    takes meds daily   Hyponatremia    Lumbar degenerative disc disease    Myocardial infarction [facility])    PAD (peripheral artery disease) (HCC)    PONV (postoperative nausea and vomiting)    Raynaud's disease  /phenomenon    Stroke ([place]) 10/15/2005   Past Surgical History:  Procedure Laterality Date   ABDOMINAL ANGIOGRAM  03/30/2012   ABDOMINAL AORTAGRAM N/A 03/30/2012   Procedure: ABDOMINAL Maxcine Ham;  Surgeon: Conrad Oljato[place], MD;  Location: [facility] CATH LAB;  Service: Cardiovascular;  Laterality: N/A;   CARDIAC CATHETERIZATION  2006   CARDIAC CATHETERIZATION N/A 12/09/2014   Procedure: Left Heart Cath and Coronary Angiography;  Surgeon: [name], MD;  Location: [facility] CV LAB;  Service: Cardiovascular;  Laterality: N/A;   CAROTID ENDARTERECTOMY     bilateral   CAROTID ENDARTERECTOMY  04/13/2005   Right and 06/03/2005 Left   CORONARY ARTERY BYPASS GRAFT N/A 12/13/2014   Procedure: CORONARY ARTERY BYPASS GRAFT times five using left internal mammary artery and endoscopic right leg saphenous vein harvest;  Surgeon:  Gaye Pollack, MD;  Location: [facility];  Service: Open Heart Surgery;  Laterality: N/A;   HERNIA REPAIR     ILIAC ARTERY STENT  10/15/2005    Left common and external    INTRAOPERATIVE TRANSESOPHAGEAL ECHOCARDIOGRAM N/A 12/13/2014   Procedure: INTRAOPERATIVE TRANSESOPHAGEAL ECHOCARDIOGRAM;  Surgeon: Gaye Pollack, MD;  Location: [facility] OR;  Service: Open Heart Surgery;  Laterality: N/A;     No outpatient medications have been marked as taking for the 10/25/18 encounter (Telemedicine) with Burtis Junes, NP.     Allergies:   Hctz [hydrochlorothiazide]; Heparin; Norvasc [amlodipine besylate]; and Pletal [cilostazol]   Social History   Tobacco Use   Smoking status: Former Smoker    Types: Cigarettes    Last attempt to quit: 05/24/2005    Years since quitting: 13.4   Smokeless tobacco: Never Used  Substance Use Topics   Alcohol use: Yes    Alcohol/week: 24.0 standard drinks    Types: 24 Cans of beer per week    Comment: approximately 24 beer a week   Drug use: No     Family Hx: The patient's family history includes Coronary artery disease (age of onset: 37) in his  father; Dementia in his mother; Heart disease in his brother and father; Hyperlipidemia in his brother and father; Hypertension in his brother and father; Other (age of onset: 39) in his mother; Peripheral vascular disease in his father.  ROS:   Please see the history of present illness.   All other systems reviewed are negative except for back pain.    Objective:    Vital Signs:  There were no vitals taken for this visit.   Wt Readings from Last 3 Encounters:  10/20/18 183 lb 3.2 oz (83.1 kg)  10/09/18 194 lb 10.7 oz (88.3 kg)  10/08/18 194 lb 10.7 oz (88.3 kg)    Labs/Other Tests and Data Reviewed:    Lab Results  Component Value Date   WBC 11.1 (H) 10/16/2018   HGB 9.3 (L) 10/16/2018   HCT 27.7 (L) 10/16/2018   PLT 154 10/16/2018   GLUCOSE 100 (H) 10/18/2018   CHOL 148 06/05/2018   TRIG 48 06/05/2018   HDL 72 06/05/2018   LDLCALC 66 06/05/2018   ALT 11 10/16/2018   AST 14 (L) 10/16/2018   NA 136 10/18/2018   K 4.2 10/18/2018   CL 106 10/18/2018   CREATININE 3.60 (H) 10/18/2018   BUN 64 (H) 10/18/2018   CO2 17 (L) 10/18/2018   TSH 0.720 06/05/2018   INR 0.98 02/25/2017   HGBA1C 5.4 12/12/2014     BNP (last 3 results) No results for input(s): BNP in the last 8760 hours.  ProBNP (last 3 results) No results for input(s): PROBNP in the last 8760 hours.    Prior CV studies:    The following studies were reviewed today:  CT ABD IMPRESSION 09/2018: 1. Again noted is an 8 cm infrarenal abdominal aortic aneurysm. There are increasing inflammatory changes surrounding the inferior aspect of the aneurysm. In the setting of abdominal pain, this is concerning for impending rupture. Surgical consultation is recommended. 2. Extensive atherosclerotic changes of the visualized vascular structures are again noted. 3. Somewhat atrophic kidneys bilaterally. 4. Stable additional chronic findings as above.  Aortic Atherosclerosis (ICD10-I70.0).  These results will be  called to the ordering clinician or representative by the Radiologist Assistant, and communication documented in the PACS or zVision Dashboard.   Electronically Signed   By: Jamie Kato.D.  On: 10/16/2018 21:55   Echo Study Conclusions 07/2016  - Left ventricle: The cavity size was normal. There was moderate   concentric hypertrophy. Systolic function was normal. The   estimated ejection fraction was in the range of 60% to 65%. Wall   motion was normal; there were no regional wall motion   abnormalities. Features are consistent with a pseudonormal left   ventricular filling pattern, with concomitant abnormal relaxation   and increased filling pressure (grade 2 diastolic dysfunction).   Doppler parameters are consistent with elevated ventricular   end-diastolic filling pressure. - Aortic valve: There was no regurgitation. - Aortic root: The aortic root was normal in size. - Mitral valve: There was mild regurgitation. - Left atrium: The atrium was moderately dilated. - Right ventricle: Systolic function was normal. - Tricuspid valve: There was mild regurgitation. - Pulmonary arteries: Systolic pressure was within the normal   range. - Inferior vena cava: The vessel was normal in size. - Pericardium, extracardiac: There was no pericardial effusion.  Impressions:  - When compared to the prior study on 12/13/2014 LVEF has improved   from 40-45% to 60-65%, inferior wall hypokinesis has resolved.    ASSESSMENT & PLAN:    1. CAD -prior CABG - managed conservatively - no active symptoms reported. Now on Hospice services. He was not actually seen for this visit. This was a 26 minute call with the wife to help give advice/support. She has a call into Hospice. I am happy to talk with Hospice as needed. I have advised that for now, she remove the Fentanyl patch (then wash her hands), use the Morphine prn - but that this may be just part of the process as he approaches end  of life. She is trying to get a room set up and will be arranging in home help. Her goal is to let him die at home - she was just under the assumption that his death will be quick as the aneurysm ruptured. I am happy to help anyway possible - overall, very sad situation.   2. Enlarging AAA  3. Enlarging Umbilical Hernia  4. Chronic anemia  5. Chronic back pain  6. HTN  7. Chronic systolic HF - last echo with improvement in his EF  8.ChronicThrombocytopenia  - Heparin induced clinically and HIT Ab panel positive - Serotonin release assay positive as well and hence HIT confirmed - Heparin is listed as an allergy for him  9. HLD          10.  COVID-19 Education: The signs and symptoms of COVID-19 were discussed with the patient and how to seek care for testing (follow up with PCP or arrange E-visit).  The importance of social distancing, staying at home, hand hygiene and wearing a mask when out in public were discussed today.  Patient Risk:   After full review of this patient's clinical status, I feel that they are at least moderate risk at this time.  Time:   Today, I have spent 26 minutes with the patient with telehealth technology discussing the above issues.     Medication Adjustments/Labs and Tests Ordered: Current medicines are reviewed at length with the patient today.  Concerns regarding medicines are outlined above.   Tests Ordered: No orders of the defined types were placed in this encounter.   Medication Changes: No orders of the defined types were placed in this encounter.   Disposition:  See as needed. Will be available as needed.   Patient is agreeable  to this plan and will call if any problems develop in the interim.   Amie Critchley, NP  10/25/2018 9:03 AM    [place]

## 2018-10-25 ENCOUNTER — Encounter: Payer: Self-pay | Admitting: Nurse Practitioner

## 2018-10-25 ENCOUNTER — Other Ambulatory Visit: Payer: Self-pay

## 2018-10-25 ENCOUNTER — Telehealth (INDEPENDENT_AMBULATORY_CARE_PROVIDER_SITE_OTHER): Payer: Medicare Other | Admitting: Nurse Practitioner

## 2018-10-25 DIAGNOSIS — I25810 Atherosclerosis of coronary artery bypass graft(s) without angina pectoris: Secondary | ICD-10-CM

## 2018-10-25 DIAGNOSIS — I714 Abdominal aortic aneurysm, without rupture, unspecified: Secondary | ICD-10-CM

## 2018-10-25 DIAGNOSIS — Z7189 Other specified counseling: Secondary | ICD-10-CM

## 2018-10-25 DIAGNOSIS — I5022 Chronic systolic (congestive) heart failure: Secondary | ICD-10-CM

## 2018-10-25 DIAGNOSIS — I10 Essential (primary) hypertension: Secondary | ICD-10-CM

## 2018-10-25 DIAGNOSIS — Z515 Encounter for palliative care: Secondary | ICD-10-CM

## 2018-10-25 NOTE — Patient Instructions (Addendum)
After Visit Summary:   Call the [facility] office at [phone] if you have any questions, problems or concerns.       

## 2018-11-22 DEATH — deceased
